# Patient Record
Sex: Female | Born: 1955 | Race: White | Hispanic: No | Smoking: Never smoker
Health system: Southern US, Community
[De-identification: ages and names within clinical notes are randomized; demographics above are authoritative.]

## PROBLEM LIST (undated history)

## (undated) DIAGNOSIS — E669 Obesity, unspecified: Secondary | ICD-10-CM

## (undated) DIAGNOSIS — Z9889 Other specified postprocedural states: Secondary | ICD-10-CM

## (undated) DIAGNOSIS — N189 Chronic kidney disease, unspecified: Secondary | ICD-10-CM

## (undated) DIAGNOSIS — I4892 Unspecified atrial flutter: Secondary | ICD-10-CM

## (undated) DIAGNOSIS — I4891 Unspecified atrial fibrillation: Secondary | ICD-10-CM

## (undated) DIAGNOSIS — R112 Nausea with vomiting, unspecified: Secondary | ICD-10-CM

## (undated) DIAGNOSIS — E119 Type 2 diabetes mellitus without complications: Secondary | ICD-10-CM

## (undated) DIAGNOSIS — I10 Essential (primary) hypertension: Secondary | ICD-10-CM

## (undated) DIAGNOSIS — E039 Hypothyroidism, unspecified: Secondary | ICD-10-CM

## (undated) HISTORY — DX: Obesity, unspecified: E66.9

## (undated) HISTORY — DX: Chronic kidney disease, unspecified: N18.9

## (undated) HISTORY — DX: Unspecified atrial fibrillation: I48.91

---

## 1977-12-18 HISTORY — PX: CHOLECYSTECTOMY OPEN: SUR202

## 1977-12-18 HISTORY — PX: APPENDECTOMY: SHX54

## 1978-12-18 HISTORY — PX: TUBAL LIGATION: SHX77

## 2001-07-11 ENCOUNTER — Encounter: Payer: Self-pay | Admitting: Obstetrics and Gynecology

## 2001-07-11 ENCOUNTER — Ambulatory Visit (HOSPITAL_COMMUNITY): Admission: RE | Admit: 2001-07-11 | Discharge: 2001-07-11 | Payer: Self-pay | Admitting: Obstetrics and Gynecology

## 2001-08-05 ENCOUNTER — Emergency Department (HOSPITAL_COMMUNITY): Admission: EM | Admit: 2001-08-05 | Discharge: 2001-08-05 | Payer: Self-pay | Admitting: Emergency Medicine

## 2001-08-05 ENCOUNTER — Encounter: Payer: Self-pay | Admitting: Emergency Medicine

## 2009-07-07 ENCOUNTER — Emergency Department (HOSPITAL_COMMUNITY): Admission: EM | Admit: 2009-07-07 | Discharge: 2009-07-08 | Payer: Self-pay | Admitting: Emergency Medicine

## 2009-10-06 ENCOUNTER — Ambulatory Visit (HOSPITAL_COMMUNITY)
Admission: RE | Admit: 2009-10-06 | Discharge: 2009-10-06 | Payer: Self-pay | Admitting: Physical Medicine and Rehabilitation

## 2010-06-18 ENCOUNTER — Emergency Department (HOSPITAL_COMMUNITY): Admission: EM | Admit: 2010-06-18 | Discharge: 2010-06-18 | Payer: Self-pay | Admitting: Emergency Medicine

## 2010-10-23 ENCOUNTER — Emergency Department (HOSPITAL_COMMUNITY): Admission: EM | Admit: 2010-10-23 | Discharge: 2010-10-23 | Payer: Self-pay | Admitting: Emergency Medicine

## 2011-02-28 LAB — BASIC METABOLIC PANEL
CO2: 27 mEq/L (ref 19–32)
Calcium: 9.1 mg/dL (ref 8.4–10.5)
GFR calc Af Amer: 54 mL/min — ABNORMAL LOW (ref >60)
Sodium: 137 mEq/L (ref 135–145)

## 2011-02-28 LAB — CBC
Hemoglobin: 12.8 g/dL (ref 12.0–15.0)
MCH: 27.5 pg (ref 26.0–34.0)
RBC: 4.63 MIL/uL (ref 3.87–5.11)
WBC: 9.3 10*3/uL (ref 4.0–10.5)

## 2011-02-28 LAB — DIFFERENTIAL
Eosinophils Absolute: 0.2 10*3/uL (ref 0.0–0.7)
Lymphocytes Relative: 19 % (ref 12–46)
Lymphs Abs: 1.8 10*3/uL (ref 0.7–4.0)
Monocytes Relative: 7 % (ref 3–12)
Neutro Abs: 6.7 10*3/uL (ref 1.7–7.7)
Neutrophils Relative %: 72 % (ref 43–77)

## 2011-02-28 LAB — POCT CARDIAC MARKERS
CKMB, poc: 3.9 ng/mL (ref 1.0–8.0)
Myoglobin, poc: 166 ng/mL (ref 12–200)
Troponin i, poc: <0.05 ng/mL (ref 0.00–0.09)

## 2011-03-05 LAB — POCT CARDIAC MARKERS
Myoglobin, poc: 86.4 ng/mL (ref 12–200)
Troponin i, poc: <0.05 ng/mL (ref 0.00–0.09)
Troponin i, poc: <0.05 ng/mL (ref 0.00–0.09)

## 2011-03-05 LAB — DIFFERENTIAL
Basophils Absolute: 0 10*3/uL (ref 0.0–0.1)
Eosinophils Absolute: 0.4 10*3/uL (ref 0.0–0.7)
Lymphocytes Relative: 20 % (ref 12–46)
Lymphs Abs: 1.8 10*3/uL (ref 0.7–4.0)
Neutrophils Relative %: 68 % (ref 43–77)

## 2011-03-05 LAB — BASIC METABOLIC PANEL
BUN: 31 mg/dL — ABNORMAL HIGH (ref 6–23)
Calcium: 9 mg/dL (ref 8.4–10.5)
Creatinine, Ser: 1.17 mg/dL (ref 0.4–1.2)
GFR calc Af Amer: 59 mL/min — ABNORMAL LOW (ref >60)

## 2011-03-05 LAB — CBC
Platelets: 195 10*3/uL (ref 150–400)
RBC: 4.3 MIL/uL (ref 3.87–5.11)
RDW: 13.1 % (ref 11.5–15.5)
WBC: 9.4 10*3/uL (ref 4.0–10.5)

## 2012-11-09 ENCOUNTER — Emergency Department (HOSPITAL_COMMUNITY): Payer: 59

## 2012-11-09 ENCOUNTER — Inpatient Hospital Stay (HOSPITAL_COMMUNITY)
Admission: EM | Admit: 2012-11-09 | Discharge: 2012-11-11 | DRG: 309 | Disposition: A | Discharge status: Home / Self Care | Payer: 59 | Attending: Family Medicine | Admitting: Family Medicine

## 2012-11-09 ENCOUNTER — Encounter (HOSPITAL_COMMUNITY): Payer: Self-pay | Admitting: Emergency Medicine

## 2012-11-09 DIAGNOSIS — E119 Type 2 diabetes mellitus without complications: Secondary | ICD-10-CM | POA: Diagnosis present

## 2012-11-09 DIAGNOSIS — I10 Essential (primary) hypertension: Secondary | ICD-10-CM | POA: Diagnosis present

## 2012-11-09 DIAGNOSIS — E039 Hypothyroidism, unspecified: Secondary | ICD-10-CM | POA: Diagnosis present

## 2012-11-09 DIAGNOSIS — R079 Chest pain, unspecified: Secondary | ICD-10-CM

## 2012-11-09 DIAGNOSIS — Z6841 Body Mass Index (BMI) 40.0 and over, adult: Secondary | ICD-10-CM

## 2012-11-09 DIAGNOSIS — E669 Obesity, unspecified: Secondary | ICD-10-CM | POA: Diagnosis present

## 2012-11-09 DIAGNOSIS — I4891 Unspecified atrial fibrillation: Secondary | ICD-10-CM

## 2012-11-09 DIAGNOSIS — Z79899 Other long term (current) drug therapy: Secondary | ICD-10-CM

## 2012-11-09 LAB — LIPID PANEL
Cholesterol: 188 mg/dL (ref 0–200)
LDL Cholesterol: 110 mg/dL — ABNORMAL HIGH (ref 0–99)
Total CHOL/HDL Ratio: 3.3 RATIO
Triglycerides: 107 mg/dL (ref <150)
VLDL: 21 mg/dL (ref 0–40)

## 2012-11-09 LAB — HEPATIC FUNCTION PANEL
Albumin: 2.9 g/dL — ABNORMAL LOW (ref 3.5–5.2)
Alkaline Phosphatase: 48 U/L (ref 39–117)
Total Bilirubin: 0.3 mg/dL (ref 0.3–1.2)
Total Protein: 6.5 g/dL (ref 6.0–8.3)

## 2012-11-09 LAB — CBC
HCT: 35.7 % — ABNORMAL LOW (ref 36.0–46.0)
Hemoglobin: 11.8 g/dL — ABNORMAL LOW (ref 12.0–15.0)
MCV: 82.1 fL (ref 78.0–100.0)
RDW: 14.4 % (ref 11.5–15.5)
WBC: 9.1 10*3/uL (ref 4.0–10.5)

## 2012-11-09 LAB — BASIC METABOLIC PANEL
BUN: 33 mg/dL — ABNORMAL HIGH (ref 6–23)
Chloride: 96 mEq/L (ref 96–112)
Creatinine, Ser: 1.74 mg/dL — ABNORMAL HIGH (ref 0.50–1.10)
GFR calc Af Amer: 37 mL/min — ABNORMAL LOW (ref >90)
Glucose, Bld: 196 mg/dL — ABNORMAL HIGH (ref 70–99)
Potassium: 3.6 mEq/L (ref 3.5–5.1)

## 2012-11-09 LAB — TROPONIN I
Troponin I: <0.3 ng/mL (ref <0.30)
Troponin I: <0.3 ng/mL (ref <0.30)

## 2012-11-09 MED ORDER — DILTIAZEM HCL ER 60 MG PO CP12
120.0000 mg | ORAL_CAPSULE | Freq: Two times a day (BID) | ORAL | Status: DC
  Administered 2012-11-09 – 2012-11-11 (×5): 120 mg via ORAL
  Filled 2012-11-09 (×9): qty 2

## 2012-11-09 MED ORDER — NITROGLYCERIN 0.4 MG SL SUBL
0.4000 mg | SUBLINGUAL_TABLET | SUBLINGUAL | Status: DC | PRN
  Filled 2012-11-09: qty 25

## 2012-11-09 MED ORDER — ENOXAPARIN SODIUM 60 MG/0.6ML ~~LOC~~ SOLN
60.0000 mg | Freq: Every day | SUBCUTANEOUS | Status: DC
  Administered 2012-11-09 – 2012-11-11 (×3): 60 mg via SUBCUTANEOUS
  Filled 2012-11-09 (×3): qty 0.6

## 2012-11-09 MED ORDER — DILTIAZEM HCL 25 MG/5ML IV SOLN
INTRAVENOUS | Status: AC
  Filled 2012-11-09: qty 5

## 2012-11-09 MED ORDER — SODIUM CHLORIDE 0.9 % IV SOLN
INTRAVENOUS | Status: DC
  Administered 2012-11-09: 07:00:00 via INTRAVENOUS

## 2012-11-09 MED ORDER — METOPROLOL TARTRATE 25 MG PO TABS
25.0000 mg | ORAL_TABLET | Freq: Two times a day (BID) | ORAL | Status: DC
  Administered 2012-11-09 – 2012-11-11 (×5): 25 mg via ORAL
  Filled 2012-11-09 (×5): qty 1

## 2012-11-09 MED ORDER — METOPROLOL TARTRATE 50 MG PO TABS: 50.0000 mg | ORAL_TABLET | Freq: Two times a day (BID) | ORAL | Status: DC

## 2012-11-09 MED ORDER — DILTIAZEM HCL 100 MG IV SOLR
5.0000 mg/h | INTRAVENOUS | Status: DC
  Administered 2012-11-09: 5 mg/h via INTRAVENOUS

## 2012-11-09 MED ORDER — SODIUM CHLORIDE 0.9 % IV SOLN
INTRAVENOUS | Status: DC
  Administered 2012-11-09: 05:00:00 via INTRAVENOUS

## 2012-11-09 MED ORDER — DILTIAZEM HCL 50 MG/10ML IV SOLN
10.0000 mg | Freq: Once | INTRAVENOUS | Status: AC
  Administered 2012-11-09: 10 mg via INTRAVENOUS
  Filled 2012-11-09: qty 2

## 2012-11-09 MED ORDER — LEVOTHYROXINE SODIUM 25 MCG PO TABS
25.0000 ug | ORAL_TABLET | Freq: Every day | ORAL | Status: DC
  Administered 2012-11-09 – 2012-11-11 (×3): 25 ug via ORAL
  Filled 2012-11-09 (×3): qty 1

## 2012-11-09 MED ORDER — ASPIRIN 81 MG PO CHEW
324.0000 mg | CHEWABLE_TABLET | Freq: Once | ORAL | Status: AC
  Administered 2012-11-09: 324 mg via ORAL
  Filled 2012-11-09: qty 4

## 2012-11-09 NOTE — Progress Notes (Signed)
Pharmacy Consult: Lovenox for VTE prophylaxis.  Patient Measurements: Height: 5\' 6"  (167.6 cm) Weight: 290 lb 5.5 oz (131.7 kg) IBW/kg (Calculated) : 59.3  Body mass index is 46.86 kg/(m^2).   VITALS Filed Vitals:   11/09/12 0817  BP: 104/70  Pulse: 62  Temp: 97.3 F (36.3 C)  Resp: 17    INR Last Three Days: No results found for this basename: INR:3 in the last 72 hours  CBC:    Component Value Date/Time   WBC 9.1 11/09/2012 0521   RBC 4.35 11/09/2012 0521   HGB 11.8* 11/09/2012 0521   HCT 35.7* 11/09/2012 0521   PLT 193 11/09/2012 0521   MCV 82.1 11/09/2012 0521   MCH 27.1 11/09/2012 0521   MCHC 33.1 11/09/2012 0521   RDW 14.4 11/09/2012 0521   LYMPHSABS 1.8 10/23/2010 0424   MONOABS 0.6 10/23/2010 0424   EOSABS 0.2 10/23/2010 0424   BASOSABS 0.0 10/23/2010 0424    RENAL FUNCTION: Estimated Creatinine Clearance: 51.5 ml/min (by C-G formula based on Cr of 1.74).  Assessment: Dose stable for age, weight, renal function and indication.  Plan: Dose adjusted (0.5mg /kg) for increased BMI. Sign off.  Pricilla Larsson, [facility] 11/09/2012 11:06 AM

## 2012-11-09 NOTE — ED Provider Notes (Signed)
History     CSN: [id number]  Arrival date & time 11/09/12  0445   First MD Initiated Contact with Patient 11/09/12 0446      Chief Complaint  Patient presents with  . Chest Pain    (Consider location/radiation/quality/duration/timing/severity/associated sxs/prior treatment) HPI History provided by patient. We'll go tonight around 3 AM with palpitations and chest pain radiating across her chest down her left arm and also to her jaw. Moderate severity with chest pain lasting about 15 minutes and still somewhat mildly present. Was moderate in severity. Pressure-like in quality. Patient is treated for high blood pressure has no known history of heart disease. Is not a smoker. Has strong family history of heart disease in both of her parents with coronary artery disease at young age. Her brother had an MI in his 63s. Pt  has never had a stress test. has had chest pain in the past.  Past Medical History  Diagnosis Date  . Hypertension   . Thyroid disease     Past Surgical History  Procedure Date  . Cholecystectomy     History reviewed. No pertinent family history.  History  Substance Use Topics  . Smoking status: Never Smoker   . Smokeless tobacco: Not on file  . Alcohol Use: No    OB History    Grav Para Term Preterm Abortions TAB SAB Ect Mult Living                  Review of Systems  Constitutional: Negative for fever and chills.  HENT: Negative for neck pain and neck stiffness.   Eyes: Negative for pain.  Respiratory: Negative for shortness of breath.   Cardiovascular: Positive for chest pain and palpitations.  Gastrointestinal: Negative for abdominal pain.  Genitourinary: Negative for dysuria.  Musculoskeletal: Negative for back pain.  Skin: Negative for rash.  Neurological: Negative for headaches.  All other systems reviewed and are negative.    Allergies  Review of patient's allergies indicates no known allergies.  Home Medications   Current Outpatient  Rx  Name  Route  Sig  Dispense  Refill  . LEVOTHYROXINE SODIUM 25 MCG PO TABS   Oral   Take 25 mcg by mouth daily.           BP 141/73  Pulse 103  Temp 97.8 F (36.6 C) (Oral)  Resp 18  Ht 5\' 6"  (1.676 m)  Wt 210 lb (95.255 kg)  BMI 33.89 kg/m2  SpO2 98%  Physical Exam  Constitutional: She is oriented to person, place, and time. She appears well-developed and well-nourished.  HENT:  Head: Normocephalic and atraumatic.  Eyes: Conjunctivae normal and EOM are normal. Pupils are equal, round, and reactive to light.  Neck: Trachea normal. Neck supple. No thyromegaly present.  Cardiovascular: S1 normal, S2 normal and normal pulses.     No systolic murmur is present   No diastolic murmur is present  Pulses:      Radial pulses are 2+ on the right side, and 2+ on the left side.       Rapid irregular  Pulmonary/Chest: Effort normal and breath sounds normal. She has no wheezes. She has no rhonchi. She has no rales. She exhibits no tenderness.  Abdominal: Soft. Normal appearance and bowel sounds are normal. There is no tenderness. There is no CVA tenderness and negative Murphy's sign.  Musculoskeletal:       BLE:s Calves nontender, no cords or erythema, negative Homans sign  Neurological: She is alert  and oriented to person, place, and time. She has normal strength. No cranial nerve deficit or sensory deficit. GCS eye subscore is 4. GCS verbal subscore is 5. GCS motor subscore is 6.  Skin: Skin is warm and dry. No rash noted. She is not diaphoretic.  Psychiatric: Her speech is normal.       Cooperative and appropriate    ED Course  Procedures (including critical care time)  Results for orders placed during the hospital encounter of 11/09/12  CBC      Component Value Range   WBC 9.1  4.0 - 10.5 K/uL   RBC 4.35  3.87 - 5.11 MIL/uL   Hemoglobin 11.8 (*) 12.0 - 15.0 g/dL   HCT 35.7 (*) 36.0 - 46.0 %   MCV 82.1  78.0 - 100.0 fL   MCH 27.1  26.0 - 34.0 pg   MCHC 33.1  30.0 - 36.0  g/dL   RDW 14.4  11.5 - 15.5 %   Platelets 193  150 - 400 K/uL  BASIC METABOLIC PANEL      Component Value Range   Sodium 138  135 - 145 mEq/L   Potassium 3.6  3.5 - 5.1 mEq/L   Chloride 96  96 - 112 mEq/L   CO2 30  19 - 32 mEq/L   Glucose, Bld 196 (*) 70 - 99 mg/dL   BUN 33 (*) 6 - 23 mg/dL   Creatinine, Ser 1.74 (*) 0.50 - 1.10 mg/dL   Calcium 9.7  8.4 - 10.5 mg/dL   GFR calc non Af Amer 32 (*) >90 mL/min   GFR calc Af Amer 37 (*) >90 mL/min  TROPONIN I      Component Value Range   Troponin I <0.30  <0.30 ng/mL   Dg Chest Portable 1 View  11/09/2012  *RADIOLOGY REPORT*  Clinical Data: Palpitations and chest pain radiating to the left arm.  PORTABLE CHEST - 1 VIEW  Comparison: 10/23/2010  Findings: The heart size and pulmonary vascularity are normal. The lungs appear clear and expanded without focal air space disease or consolidation. No blunting of the costophrenic angles.  No pneumothorax.  Mediastinal contours appear intact.  No significant change since previous study.  IMPRESSION: No evidence of active pulmonary disease.   Original Report Authenticated By: Lucienne Capers, M.D.        Date: 11/09/2012  Rate: 152  Rhythm: atrial fibrillation  QRS Axis: normal  Intervals: normal  ST/T Wave abnormalities: nonspecific ST changes  Conduction Disutrbances:none  Narrative Interpretation:   Old EKG Reviewed: none available  CRITICAL CARE Performed by: Markel Kurtenbach   Total critical care time: 45  Critical care time was exclusive of separately billable procedures and treating other patients.  Critical care was necessary to treat or prevent imminent or life-threatening deterioration.  Critical care was time spent personally by me on the following activities: development of treatment plan with patient and/or surrogate as well as nursing, discussions with consultants, evaluation of patient's response to treatment, examination of patient, obtaining history from patient or  surrogate, ordering and performing treatments and interventions, ordering and review of laboratory studies, ordering and review of radiographic studies, pulse oximetry and re-evaluation of patient's condition. IV Cardizem initiated for rapid A. Fib rate 150s to 170s. Patient initially normotensive but after Cardizem bolus and drip dropped into systolic in the [id number] requiring IV fluid boluses. Patient remains symptomatic in rapid A. Fib with challenging balance between rate control and blood pressure control. Chest x-ray, EKG and labs  reviewed as above. Medicine consult for step down ICU admission. Family updated bedside. Patient updated bedside. Serial evaluations.  6:33 AM case d/w Dr Hilma Favors - will admit. HR improving still in a fib. BP improving with IVfs still running.  MDM   Rapid atrial fibrillation with history of thyroid disease and strong family history of heart disease. Requiring IV Cardizem bolus and drip. IVFs. Labs. CXR. Step down admit.        Teressa Lower, MD 11/09/12 [phone]

## 2012-11-09 NOTE — ED Notes (Signed)
Patient complaining of palpitations and chest pain starting approximately 2 hours ago. States pain radiates into left arm and jaw area.

## 2012-11-09 NOTE — Progress Notes (Signed)
DR DONDIEGO AWARE OF ADMISSION OF PT TO ICU. HE WILL BE COMING TO EXAMINE.

## 2012-11-09 NOTE — Progress Notes (Signed)
PT RECEIVED 120MG  PO CARDIZEM 1HR AGO. IV CARDIZEM DRIP IS NOW STOPPED. PT IN SB TO NSR.

## 2012-11-09 NOTE — H&P (Signed)
[id number] 

## 2012-11-09 NOTE — Progress Notes (Signed)
DR DONDIEGO IN TO EXAMINE PT AND DISCUSS PLAN OF CARE.

## 2012-11-10 LAB — BASIC METABOLIC PANEL
BUN: 28 mg/dL — ABNORMAL HIGH (ref 6–23)
Calcium: 9.6 mg/dL (ref 8.4–10.5)
Creatinine, Ser: 1.29 mg/dL — ABNORMAL HIGH (ref 0.50–1.10)
GFR calc Af Amer: 53 mL/min — ABNORMAL LOW (ref >90)
GFR calc non Af Amer: 46 mL/min — ABNORMAL LOW (ref >90)
Glucose, Bld: 166 mg/dL — ABNORMAL HIGH (ref 70–99)
Potassium: 4 mEq/L (ref 3.5–5.1)

## 2012-11-10 NOTE — Progress Notes (Signed)
11/10/12 1703 Patient ambulated in hallway several times this afternoon, tolerated well. Donavan Foil, RN.

## 2012-11-10 NOTE — H&P (Signed)
NAMEMarland Kitchen  JACK, RISTAU NO.:  [id number]  MEDICAL RECORD NO.:  [id number]  LOCATION:  A316                          FACILITY:  [facility]  PHYSICIAN:  Unk Lightning, MDDATE OF BIRTH:  [birth year]  DATE OF ADMISSION:  11/09/2012 DATE OF DISCHARGE:  LH                             HISTORY & PHYSICAL   HISTORY OF PRESENT ILLNESS:  The patient is a 56 year old married white female who has had 3 or 4 episodes of presumably paroxysmal atrial fibrillation.  She came last night awake by rapid heartbeat jumping in of her chest associated with some chest tightness and discomfort.  She was quite anxious and fearful.  She came to the ER, was found to be in paroxysmal AFib with rapid ventricular response, given IV diltiazem and subsequently converted to sinus rhythm.  She is only on thyroid medicine and her blood pressure medicine.  She never smoked, has a normal cholesterol.  She states is not diabetic.  The patient will be admitted, obtain 2D echocardiogram, thyroid function test, assess for chambers dimensions and possible functional evaluation if they deem clinically necessary.  PAST MEDICAL HISTORY:  Significant for hypertension, hypothyroidism supplemented.  PAST SURGICAL HISTORY:  Remarkable for cholecystectomy.  CURRENT MEDICINES:  Synthroid 0.25 mg p.o. daily and her blood pressure medicine unknown, calcium, and multiple vitamins.  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  PHYSICAL EXAMINATION:  VITAL SIGNS:  Blood pressure 141/73, pulse 103 and regular, temperature 97.8, respiratory rate is 18. HEENT:  Eyes, PERRLA.  Extraocular movements intact.  Sclerae clear. Conjunctivae pink. NECK:  No JVD.  No carotid bruits.  No thyromegaly.  No thyroid bruits. LUNGS:  Clear to A and P.  No rales, wheeze, or rhonchi. HEART:  Regular rhythm.  No S3, S4, gallops.  No heaves, thrills, or rubs. ABDOMEN:  Soft, nontender.  Bowel sounds are normoactive.   No guarding, rebound, mass, or megaly. EXTREMITIES:  1+ pedal edema.  No palpable cord tenderness. NEUROLOGIC:  Cranial nerves are grossly intact, II through XII.  The patient moves all four extremities.  Plantars are downgoing.  IMPRESSION: 1. Paroxysmal atrial fibrillation, presumably recurrent episode 3 or     4. 2. Hypothyroidism, on supplementation with possible overactive     thyroid.  We will check TFTs. 3. Hypertension.  PLAN:  Right now is to continue IV diltiazem, switch to oral beta and/or calcium channel blockade to control ventricular response.  Monitor the patient's thyroid function test.  We will make further recommendations as the database expands.     Unk Lightning, MD     RMD/MEDQ  D:  11/09/2012  T:  11/10/2012  Job:  [id number]

## 2012-11-10 NOTE — Progress Notes (Signed)
[id number] 

## 2012-11-11 DIAGNOSIS — I319 Disease of pericardium, unspecified: Secondary | ICD-10-CM

## 2012-11-11 LAB — BASIC METABOLIC PANEL
BUN: 27 mg/dL — ABNORMAL HIGH (ref 6–23)
Chloride: 103 mEq/L (ref 96–112)
Glucose, Bld: 196 mg/dL — ABNORMAL HIGH (ref 70–99)
Potassium: 4.6 mEq/L (ref 3.5–5.1)

## 2012-11-11 MED ORDER — DILTIAZEM HCL 120 MG PO TABS: 120.0000 mg | ORAL_TABLET | Freq: Two times a day (BID) | ORAL | Status: DC

## 2012-11-11 MED ORDER — LEVOTHYROXINE SODIUM 150 MCG PO TABS: 150.0000 ug | ORAL_TABLET | Freq: Every day | ORAL | Status: DC

## 2012-11-11 NOTE — Care Management Note (Signed)
    Page 1 of 1   11/11/2012     11:13:47 AM   CARE MANAGEMENT NOTE 11/11/2012  Patient:  VELTA, MCMORRIS   Account Number:  [id number]  Date Initiated:  11/11/2012  Documentation initiated by:  Theophilus Kinds  Subjective/Objective Assessment:   Pt admitted from home with A fib. Pt lives with her husband and will return home at discharge. Pt is indepenent with ADL's.     Action/Plan:   No CM or HH needs noted. Pt to be discharged later on 11/11/12.   Anticipated DC Date:  11/11/2012   Anticipated DC Plan:  [place]  CM consult      Choice offered to / List presented to:             Status of service:  Completed, signed off Medicare Important Message given?   (If response is "NO", the following Medicare IM given date fields will be blank) Date Medicare IM given:   Date Additional Medicare IM given:    Discharge Disposition:  HOME/SELF CARE  Per UR Regulation:    If discussed at Long Length of Stay Meetings, dates discussed:    Comments:  11/11/12 [place], RN BSN CM

## 2012-11-11 NOTE — Progress Notes (Signed)
UR Chart Review Completed  

## 2012-11-11 NOTE — Discharge Summary (Signed)
NAMEMarland Kitchen  GREYDIS, DUTT NO.:  [id number]  MEDICAL RECORD NO.:  [id number]  LOCATION:  A316                          FACILITY:  [facility]  PHYSICIAN:  Estill Bamberg. Karie Kirks, M.D.DATE OF BIRTH:  16-May-1956  DATE OF ADMISSION:  11/09/2012 DATE OF DISCHARGE:  11/25/2013LH                              DISCHARGE SUMMARY   This 56 year old was admitted with atrial fibrillation.  She had a benign 3-day hospitalization extending from November 23 to November 11, 2012  Vital signs remained stable.  Admission CBC was essentially normal.  Glucoses were 196, 168, and 196. Admission BUN was 33 with creatinine 1.74, which improved to 27/1.31. Her albumin was 2.9.  Troponins were less than 0.30.  Her lipid profile was acceptable.  Her TSH, which was originally 0.367, was rechecked at 0.276.  MRSA was negative.  Chest x-ray showed no pulmonary disease.  EKG showed atrial flutter with a variable AV block with ST depression in the inferior leads and the anterior leads, with the rate being 152.  She was treated in the ER with 10 mg of diltiazem IV, which was repeated, and then put on IV infusion of 5 mg an hour.  This was later discontinued and she was put on diltiazem 120 mg daily.  She was continued on levothyroxine 175 mcg a day and hydrochlorothiazide 25 mg a day.  During hospitalization she improved, palpitations cleared, and by the day of discharge she felt much improved.  FINAL DISCHARGE DIAGNOSES: 1. Atrial fibrillation with rapid ventricular response. 2. Electrocardiogram ischemic changes may be secondary to her rate. 3. Hypothyroidism. 4. Benign essential hypertension. 5. Diabetes. 6. Obesity.  She is discharged home on home meds, except I am switching her from levothyroxine 175 to levothyroxine 150 mcg daily.  Before discharge, she will have cardiac echo, but also an EKG.  Depending on that, we will arrange for her to see Cardiology either outpatient or inpatient.   I discussed this with her.  Follow-up will be in the office within the week.  I wrote prescriptions for levothyroxine 150 mcg (30 with 11 refills) and also diltiazem 120 mg b.i.d. (60 with 11 refills).  Reviewing the record, I note that she apparently thought that she only needed to take the diltiazem once a day since she fell okay on this.  I have encouraged her to use this twice a day and if her blood pressure is too low, we can cut back her HCTZ.     Estill Bamberg. Karie Kirks, M.D.     SDK/MEDQ  D:  11/11/2012  T:  11/11/2012  Job:  [id number]

## 2012-11-11 NOTE — Progress Notes (Signed)
Inpatient Diabetes Program Recommendations  AACE/ADA: New Consensus Statement on Inpatient Glycemic Control (2013)  Target Ranges:  Prepandial:   less than 140 mg/dL      Peak postprandial:   less than 180 mg/dL (1-2 hours)      Critically ill patients:  140 - 180 mg/dL   Results for SHABRIA, TILLES (MRN [id number]) as of 11/11/2012 10:20  Ref. Range 11/09/2012 05:21 11/10/2012 06:30 11/11/2012 05:28  Glucose Latest Range: 70-99 mg/dL 196 (H) 166 (H) 196 (H)    Note: No documented history of diabetes noted in chart.  However, patient's fasting CBGs have been elevated over the past 3 days.  There is a discharge order already in the computer.  Called [facility] [address] and asked if Dr. Karie Kirks was still on the floor to make him aware of elevated blood glucose results.  He had already left the floor.  Called Dr. Vickey Sages office and spoke with secretary and gave her patient's information and asked that she let Dr. Karie Kirks know about the above fasting CBG results.  Also left my contact information in case he wanted to call me back.    Thanks, Barnie Alderman, RN, BSN, [place] [facility] Coordinator Inpatient Diabetes Program [phone]

## 2012-11-11 NOTE — Plan of Care (Signed)
Problem: Discharge Progression Outcomes Goal: Discharge plan in place and appropriate Outcome: Completed/Met Date Met:  11/11/12 D/c home stable condition, follow up with pcp in week.

## 2012-11-11 NOTE — Progress Notes (Signed)
NAMEMarland Kitchen  SONATA, [name] NO.:  [id number]  MEDICAL RECORD NO.:  [id number]  LOCATION:  A316                          FACILITY:  [facility]  PHYSICIAN:  Unk Lightning, MDDATE OF BIRTH:  09-Jun-1956  DATE OF PROCEDURE: DATE OF DISCHARGE:                                PROGRESS NOTE   The patient was admitted with paroxysmal AFib.  She has had 2-3 episodes prior to admission and over preceding months converted on IV diltiazem currently in sinus rhythm.  Hemodynamically stable, placed on diltiazem 120 p.o. b.i.d. orally as well as Lopressor 25 p.o. b.i.d. Echocardiogram awaiting, BMET will be performed in a.m.  The patient has no angina or anginal equivalents.  Blood pressure 121/56, temperature 97.8 pulse 58 and regular, respiratory rate is 20, creatinine 1.29. Lungs are clear.  Heart are unremarkable.  No rales, S3-S4, no heaves, thrills, or rubs.  Lungs showed no rales, wheeze, or rhonchi.  Heart, abdomen totally benign.  The impression is paroxysmal atrial fibrillation, converted to sinus rhythm.  An 2D echo ordered for a.m. to assess chamber dimensions, contractility in asystolic or diastolic dysfunction.  We will wait Dr. Vickey Sages return in a.m.     Unk Lightning, MD     RMD/MEDQ  D:  11/10/2012  T:  11/11/2012  Job:  [id number]

## 2012-11-11 NOTE — Progress Notes (Signed)
*  PRELIMINARY RESULTS* Echocardiogram 2D Echocardiogram has been performed.  Isabella Hall 11/11/2012, 9:52 AM

## 2013-04-02 ENCOUNTER — Encounter: Payer: Self-pay | Admitting: *Deleted

## 2013-04-09 ENCOUNTER — Other Ambulatory Visit (HOSPITAL_COMMUNITY): Payer: Self-pay | Admitting: Cardiovascular Disease

## 2013-04-09 DIAGNOSIS — R079 Chest pain, unspecified: Secondary | ICD-10-CM

## 2013-04-16 ENCOUNTER — Encounter (HOSPITAL_COMMUNITY): Payer: 59

## 2013-04-23 ENCOUNTER — Encounter: Payer: Self-pay | Admitting: Cardiovascular Disease

## 2013-04-29 ENCOUNTER — Encounter (INDEPENDENT_AMBULATORY_CARE_PROVIDER_SITE_OTHER): Payer: 59

## 2013-04-29 DIAGNOSIS — R0602 Shortness of breath: Secondary | ICD-10-CM

## 2013-04-30 ENCOUNTER — Ambulatory Visit: Payer: 59 | Admitting: Cardiovascular Disease

## 2013-05-07 ENCOUNTER — Telehealth: Payer: Self-pay | Admitting: *Deleted

## 2013-05-07 NOTE — Telephone Encounter (Signed)
Normal MET test  Report called to pt.  Pt voiced understanding.  appt in June will be rescheduled to the [place] office-pt notified

## 2013-05-21 ENCOUNTER — Ambulatory Visit: Payer: 59 | Admitting: Cardiovascular Disease

## 2015-01-19 ENCOUNTER — Encounter (HOSPITAL_COMMUNITY): Payer: Self-pay

## 2015-01-19 ENCOUNTER — Emergency Department (HOSPITAL_COMMUNITY): Payer: 59

## 2015-01-19 ENCOUNTER — Inpatient Hospital Stay (HOSPITAL_COMMUNITY)
Admission: EM | Admit: 2015-01-19 | Discharge: 2015-01-29 | DRG: 286 | Disposition: A | Discharge status: Home / Self Care | Payer: 59 | Attending: Internal Medicine | Admitting: Internal Medicine

## 2015-01-19 DIAGNOSIS — I2510 Atherosclerotic heart disease of native coronary artery without angina pectoris: Secondary | ICD-10-CM | POA: Diagnosis present

## 2015-01-19 DIAGNOSIS — I248 Other forms of acute ischemic heart disease: Secondary | ICD-10-CM | POA: Insufficient documentation

## 2015-01-19 DIAGNOSIS — E1122 Type 2 diabetes mellitus with diabetic chronic kidney disease: Secondary | ICD-10-CM

## 2015-01-19 DIAGNOSIS — I4892 Unspecified atrial flutter: Secondary | ICD-10-CM | POA: Diagnosis not present

## 2015-01-19 DIAGNOSIS — R7989 Other specified abnormal findings of blood chemistry: Secondary | ICD-10-CM

## 2015-01-19 DIAGNOSIS — I4891 Unspecified atrial fibrillation: Secondary | ICD-10-CM

## 2015-01-19 DIAGNOSIS — G4730 Sleep apnea, unspecified: Secondary | ICD-10-CM | POA: Diagnosis present

## 2015-01-19 DIAGNOSIS — E872 Acidosis: Secondary | ICD-10-CM

## 2015-01-19 DIAGNOSIS — E8729 Other acidosis: Secondary | ICD-10-CM

## 2015-01-19 DIAGNOSIS — N183 Chronic kidney disease, stage 3 (moderate): Secondary | ICD-10-CM | POA: Diagnosis present

## 2015-01-19 DIAGNOSIS — E669 Obesity, unspecified: Secondary | ICD-10-CM | POA: Diagnosis present

## 2015-01-19 DIAGNOSIS — E119 Type 2 diabetes mellitus without complications: Secondary | ICD-10-CM

## 2015-01-19 DIAGNOSIS — E039 Hypothyroidism, unspecified: Secondary | ICD-10-CM | POA: Diagnosis present

## 2015-01-19 DIAGNOSIS — Z7982 Long term (current) use of aspirin: Secondary | ICD-10-CM

## 2015-01-19 DIAGNOSIS — I429 Cardiomyopathy, unspecified: Secondary | ICD-10-CM | POA: Diagnosis present

## 2015-01-19 DIAGNOSIS — I10 Essential (primary) hypertension: Secondary | ICD-10-CM | POA: Diagnosis present

## 2015-01-19 DIAGNOSIS — R778 Other specified abnormalities of plasma proteins: Secondary | ICD-10-CM

## 2015-01-19 DIAGNOSIS — I129 Hypertensive chronic kidney disease with stage 1 through stage 4 chronic kidney disease, or unspecified chronic kidney disease: Secondary | ICD-10-CM | POA: Diagnosis present

## 2015-01-19 DIAGNOSIS — I5031 Acute diastolic (congestive) heart failure: Secondary | ICD-10-CM | POA: Diagnosis present

## 2015-01-19 DIAGNOSIS — I481 Persistent atrial fibrillation: Secondary | ICD-10-CM | POA: Diagnosis not present

## 2015-01-19 DIAGNOSIS — J810 Acute pulmonary edema: Secondary | ICD-10-CM

## 2015-01-19 DIAGNOSIS — N1832 Chronic kidney disease, stage 3b: Secondary | ICD-10-CM

## 2015-01-19 DIAGNOSIS — I4581 Long QT syndrome: Secondary | ICD-10-CM | POA: Diagnosis present

## 2015-01-19 DIAGNOSIS — Z8249 Family history of ischemic heart disease and other diseases of the circulatory system: Secondary | ICD-10-CM

## 2015-01-19 DIAGNOSIS — I428 Other cardiomyopathies: Secondary | ICD-10-CM | POA: Insufficient documentation

## 2015-01-19 DIAGNOSIS — Z79899 Other long term (current) drug therapy: Secondary | ICD-10-CM

## 2015-01-19 DIAGNOSIS — I340 Nonrheumatic mitral (valve) insufficiency: Secondary | ICD-10-CM | POA: Diagnosis present

## 2015-01-19 DIAGNOSIS — R0603 Acute respiratory distress: Secondary | ICD-10-CM

## 2015-01-19 DIAGNOSIS — J9601 Acute respiratory failure with hypoxia: Secondary | ICD-10-CM | POA: Diagnosis present

## 2015-01-19 DIAGNOSIS — R0600 Dyspnea, unspecified: Secondary | ICD-10-CM

## 2015-01-19 DIAGNOSIS — E1165 Type 2 diabetes mellitus with hyperglycemia: Secondary | ICD-10-CM | POA: Diagnosis present

## 2015-01-19 DIAGNOSIS — E876 Hypokalemia: Secondary | ICD-10-CM | POA: Diagnosis present

## 2015-01-19 DIAGNOSIS — Z6841 Body Mass Index (BMI) 40.0 and over, adult: Secondary | ICD-10-CM

## 2015-01-19 DIAGNOSIS — J9602 Acute respiratory failure with hypercapnia: Secondary | ICD-10-CM | POA: Diagnosis present

## 2015-01-19 HISTORY — DX: Hypothyroidism, unspecified: E03.9

## 2015-01-19 HISTORY — DX: Unspecified atrial flutter: I48.92

## 2015-01-19 HISTORY — DX: Essential (primary) hypertension: I10

## 2015-01-19 LAB — CBC WITH DIFFERENTIAL/PLATELET
BASOS ABS: 0.1 10*3/uL (ref 0.0–0.1)
BASOS PCT: 0 % (ref 0–1)
EOS ABS: 0.6 10*3/uL (ref 0.0–0.7)
EOS PCT: 5 % (ref 0–5)
HEMATOCRIT: 40.1 % (ref 36.0–46.0)
HEMOGLOBIN: 12.3 g/dL (ref 12.0–15.0)
LYMPHS ABS: 5 10*3/uL — AB (ref 0.7–4.0)
LYMPHS PCT: 37 % (ref 12–46)
MCH: 25.5 pg — AB (ref 26.0–34.0)
MCHC: 30.7 g/dL (ref 30.0–36.0)
MCV: 83 fL (ref 78.0–100.0)
MONO ABS: 0.6 10*3/uL (ref 0.1–1.0)
MONOS PCT: 4 % (ref 3–12)
NEUTROS ABS: 7.2 10*3/uL (ref 1.7–7.7)
NEUTROS PCT: 54 % (ref 43–77)
PLATELETS: 237 10*3/uL (ref 150–400)
RBC: 4.83 MIL/uL (ref 3.87–5.11)
RDW: 16.5 % — ABNORMAL HIGH (ref 11.5–15.5)
WBC: 13.4 10*3/uL — ABNORMAL HIGH (ref 4.0–10.5)

## 2015-01-19 LAB — BLOOD GAS, ARTERIAL
ACID-BASE DEFICIT: 4.5 mmol/L — AB (ref 0.0–2.0)
Bicarbonate: 21.4 mEq/L (ref 20.0–24.0)
Drawn by: 317771
FIO2: 1 %
O2 SAT: 96.5 %
PH ART: 7.264 — AB (ref 7.350–7.450)
PO2 ART: 106 mmHg — AB (ref 80.0–100.0)
Patient temperature: 37
TCO2: 19.8 mmol/L (ref 0–100)
pCO2 arterial: 48.8 mmHg — ABNORMAL HIGH (ref 35.0–45.0)

## 2015-01-19 LAB — BASIC METABOLIC PANEL
Anion gap: 9 (ref 5–15)
BUN: 25 mg/dL — ABNORMAL HIGH (ref 6–23)
CHLORIDE: 104 mmol/L (ref 96–112)
CO2: 23 mmol/L (ref 19–32)
Calcium: 8.7 mg/dL (ref 8.4–10.5)
Creatinine, Ser: 1.48 mg/dL — ABNORMAL HIGH (ref 0.50–1.10)
GFR calc non Af Amer: 38 mL/min — ABNORMAL LOW (ref >90)
GFR, EST AFRICAN AMERICAN: 45 mL/min — AB (ref >90)
Glucose, Bld: 277 mg/dL — ABNORMAL HIGH (ref 70–99)
Potassium: 4 mmol/L (ref 3.5–5.1)
Sodium: 136 mmol/L (ref 135–145)

## 2015-01-19 LAB — D-DIMER, QUANTITATIVE (NOT AT ARMC): D DIMER QUANT: 3.13 ug{FEU}/mL — AB (ref 0.00–0.48)

## 2015-01-19 LAB — PROTIME-INR
INR: 1.15 (ref 0.00–1.49)
PROTHROMBIN TIME: 14.8 s (ref 11.6–15.2)

## 2015-01-19 LAB — TROPONIN I: TROPONIN I: 0.03 ng/mL (ref <0.031)

## 2015-01-19 LAB — BRAIN NATRIURETIC PEPTIDE: B NATRIURETIC PEPTIDE 5: 688 pg/mL — AB (ref 0.0–100.0)

## 2015-01-19 MED ORDER — IPRATROPIUM-ALBUTEROL 0.5-2.5 (3) MG/3ML IN SOLN: 3.0000 mL | Freq: Once | RESPIRATORY_TRACT | Status: DC

## 2015-01-19 MED ORDER — IOHEXOL 350 MG/ML SOLN
80.0000 mL | Freq: Once | INTRAVENOUS | Status: AC | PRN
  Administered 2015-01-19: 80 mL via INTRAVENOUS

## 2015-01-19 MED ORDER — DILTIAZEM LOAD VIA INFUSION
20.0000 mg | Freq: Once | INTRAVENOUS | Status: AC
  Administered 2015-01-19: 20 mg via INTRAVENOUS

## 2015-01-19 MED ORDER — ASPIRIN 81 MG PO CHEW
324.0000 mg | CHEWABLE_TABLET | Freq: Once | ORAL | Status: AC
  Administered 2015-01-19: 324 mg via ORAL
  Filled 2015-01-19: qty 4

## 2015-01-19 MED ORDER — DILTIAZEM HCL 100 MG IV SOLR
5.0000 mg/h | INTRAVENOUS | Status: DC
  Administered 2015-01-19: 5 mg/h via INTRAVENOUS
  Administered 2015-01-20: 15 mg/h via INTRAVENOUS
  Administered 2015-01-20 (×2): 20 mg/h via INTRAVENOUS

## 2015-01-19 MED ORDER — METHYLPREDNISOLONE SODIUM SUCC 125 MG IJ SOLR
125.0000 mg | Freq: Once | INTRAMUSCULAR | Status: AC
  Administered 2015-01-19: 125 mg via INTRAVENOUS
  Filled 2015-01-19: qty 2

## 2015-01-19 MED ORDER — FUROSEMIDE 10 MG/ML IJ SOLN
40.0000 mg | Freq: Once | INTRAMUSCULAR | Status: AC
  Administered 2015-01-19: 40 mg via INTRAVENOUS
  Filled 2015-01-19: qty 4

## 2015-01-19 NOTE — ED Notes (Signed)
Pt in by [facility] with resp distress that pt states started yesterday.  Pt denies cp or other complaints

## 2015-01-19 NOTE — ED Provider Notes (Signed)
This chart was scribed for [place], DO by Randa Evens, ED Scribe. This patient was seen in room APA18/APA18 and the patient's care was started at 9:24 PM.  TIME SEEN: 9:24 PM   CHIEF COMPLAINT: Respiratory Distress   HPI: HPI Comments: Isabella Hall is a 59 y.o. female hypertension, atrial fibrillation on diltiazem but not on anticoagulation who presents to the Emergency Department complaining of new sudden worsening difficulty breathing onset 1 day prior. Pt states she has had an albuterol breathing treatment PTA that didn't provide any relief  Pt states she is not an everyday smoker. Pt denies Hx of DVT or PE's. Pt denies Hx of  COPD or Asthma, heart failure, CAD.  Pt denies taking any exogenous estrogen use, recent prolonged immobilization such as long flight or hospitalization, fracture, surgery, trauma. Denies history of prior MI. Denies lower extremity swelling or pain. Denies history of CHF.  Denies CP, fever or cough.    ROS: See HPI Constitutional: no fever  Eyes: no drainage  ENT: no runny nose   Cardiovascular:  no chest pain  Resp: SOB  GI: no vomiting GU: no dysuria Integumentary: no rash  Allergy: no hives  Musculoskeletal: no leg swelling  Neurological: no slurred speech ROS otherwise negative  PAST MEDICAL HISTORY/PAST SURGICAL HISTORY:  Past Medical History  Diagnosis Date  . Hypertension   . Thyroid disease   . Palpitation   . Atrial fibrillation 11/11/2012    2D Echo EF 55%-60% normal left ventricular size and systolic function with very mild left ventricular hypertrophy.  . CAD (coronary artery disease)   . Obesity     MEDICATIONS:  Prior to Admission medications   Medication Sig Start Date End Date Taking? Authorizing Provider  Ascorbic Acid (VITAMIN C) 1000 MG tablet Take 1,000 mg by mouth daily.    Historical Provider, MD  aspirin EC 81 MG tablet Take 81 mg by mouth daily.    Historical Provider, MD  CHOLECALCIFEROL PO Take 1 tablet by  mouth daily.    Historical Provider, MD  Cyanocobalamin (VITAMIN B 12 PO) Take 1 tablet by mouth daily.    Historical Provider, MD  diltiazem (CARDIZEM) 120 MG tablet Take 1 tablet (120 mg total) by mouth 2 (two) times daily. 11/11/12   Robert Bellow, MD  GARLIC PO Take 1 tablet by mouth daily.    Historical Provider, MD  hydrochlorothiazide (HYDRODIURIL) 25 MG tablet Take 25 mg by mouth daily.    Historical Provider, MD  levothyroxine (SYNTHROID) 150 MCG tablet Take 1 tablet (150 mcg total) by mouth daily. 11/11/12   Robert Bellow, MD  Pyridoxine HCl (VITAMIN B-6 PO) Take 1 tablet by mouth daily.     Historical Provider, MD  sodium chloride (OCEAN) 0.65 % nasal spray Place 1 spray into both nostrils 3 (three) times daily as needed. Uses daily, up to three times per day during allergy season    Historical Provider, MD  Thiamine HCl (B-1 PO) Take 1 tablet by mouth daily.    Historical Provider, MD    ALLERGIES:  No Known Allergies  SOCIAL HISTORY:  History  Substance Use Topics  . Smoking status: Never Smoker   . Smokeless tobacco: Not on file  . Alcohol Use: No    FAMILY HISTORY: Family History  Problem Relation Age of Onset  . Atrial fibrillation Neg Hx   . Diabetes Neg Hx   . Hypertension    . Coronary artery disease Sister  coronary stents  . Cancer Mother   . Heart disease Father   . Cancer Maternal Grandmother     EXAM: BP 116/97 mmHg  Pulse 96  Temp(Src) 98 F (36.7 C) (Oral)  Resp 24  Ht 5\' 7"  (1.702 m)  Wt 208 lb (94.348 kg)  BMI 32.57 kg/m2  SpO2 100%   CONSTITUTIONAL: Alert and oriented and responds appropriately to questions. Diaphoretic. Patient is in moderate respiratory distress. Speaking short sentences.  HEAD: Normocephalic EYES: Conjunctivae clear, PERRL ENT: normal nose; no rhinorrhea; moist mucous membranes; pharynx without lesions noted NECK: Supple, no meningismus, no LAD  CARD: irregularly irregular and tachycardic; S1 and S2  appreciated; no murmurs, no clicks, no rubs, no gallops RESP:  Tachypneic; mild diffuse expiratory wheezing, bibasilar Rales, no rhonchi, patient is in moderate respiratory distress, speaking short sentences ABD/GI: Normal bowel sounds; non-distended; soft, non-tender, no rebound, no guarding BACK:  The back appears normal and is non-tender to palpation, there is no CVA tenderness EXT: Normal ROM in all joints; non-tender to palpation; no edema; normal capillary refill; no cyanosis; no calf tenderness or swelling   SKIN: Normal color for age and race; warm; diaphoretic NEURO: Moves all extremities equally PSYCH: The patient's mood and manner are appropriate. Grooming and personal hygiene are appropriate.  MEDICAL DECISION MAKING: Patient here in respiratory distress. Differential diagnosis includes pneumonia, pulmonary edema, pulmonary embolus. She is in atrial for relation with RVR. We'll start diltiazem drip and give a bolus. We'll also give aspirin. Patient denies chest pain or chest discomfort. Will obtain cardiac labs including troponin, BNP, d-dimer, portable chest x-ray. We'll also obtain ABG. She is extremely tachycardic at this time. We'll hold on further albuterol.  ED PROGRESS: ABG shows a respiratory acidosis with a PCO2 of 48.8, bicarbonate 21.4. Heart rate is now in the 140s on 15 mg per hour of IV diltiazem. Will increase to 20. She does have a mild leukocytosis of 13.4. Troponin negative. Her d-dimer is significantly elevated. BNP 688. She has received a dose of IV Lasix, IV Solu-Medrol. We'll obtain a CT image of her chest to evaluate for pulmonary embolus. Chest x-ray shows possible pulmonary edema and questionable pleural effusions.  11:45 PM  Pt's CT scan shows no pulmonary embolus. She does have bilateral pleural effusions and pulmonary edema. We'll discuss with hospitalist for admission. Heart rate is now in the 140s.  12:00 AM  D/w Dr. Maudie Mercury for admission to step down,  inpatient.    EKG Interpretation  Date/Time:  Tuesday January 19 2015 21:25:53 EST Ventricular Rate:  201 PR Interval:    QRS Duration: 83 QT Interval:  269 QTC Calculation: 492 R Axis:   63 Text Interpretation:  Atrial fibrillation with rapid V-rate Ventricular premature complex Anteroseptal infarct, age indeterminate Confirmed by Leontae Bostock,  DO, Mackay Hanauer ([id number]) on 01/19/2015 9:29:46 PM       CRITICAL CARE Performed by: Nyra Jabs   Total critical care time: 45 minutes  Critical care time was exclusive of separately billable procedures and treating other patients.  Critical care was necessary to treat or prevent imminent or life-threatening deterioration.  Critical care was time spent personally by me on the following activities: development of treatment plan with patient and/or surrogate as well as nursing, discussions with consultants, evaluation of patient's response to treatment, examination of patient, obtaining history from patient or surrogate, ordering and performing treatments and interventions, ordering and review of laboratory studies, ordering and review of radiographic studies, pulse oximetry and re-evaluation  of patient's condition.    I personally performed the services described in this documentation, which was scribed in my presence. The recorded information has been reviewed and is accurate.        [place], DO 01/20/15 0007

## 2015-01-19 NOTE — ED Notes (Signed)
Per dr ward the Cardizem can be increased to 20mg /hr

## 2015-01-20 ENCOUNTER — Encounter (HOSPITAL_COMMUNITY): Payer: Self-pay | Admitting: Internal Medicine

## 2015-01-20 DIAGNOSIS — E119 Type 2 diabetes mellitus without complications: Secondary | ICD-10-CM

## 2015-01-20 DIAGNOSIS — Z79899 Other long term (current) drug therapy: Secondary | ICD-10-CM | POA: Diagnosis not present

## 2015-01-20 DIAGNOSIS — Z8249 Family history of ischemic heart disease and other diseases of the circulatory system: Secondary | ICD-10-CM | POA: Diagnosis not present

## 2015-01-20 DIAGNOSIS — E876 Hypokalemia: Secondary | ICD-10-CM | POA: Diagnosis present

## 2015-01-20 DIAGNOSIS — R7989 Other specified abnormal findings of blood chemistry: Secondary | ICD-10-CM

## 2015-01-20 DIAGNOSIS — I481 Persistent atrial fibrillation: Secondary | ICD-10-CM | POA: Diagnosis present

## 2015-01-20 DIAGNOSIS — J9601 Acute respiratory failure with hypoxia: Secondary | ICD-10-CM | POA: Diagnosis present

## 2015-01-20 DIAGNOSIS — I340 Nonrheumatic mitral (valve) insufficiency: Secondary | ICD-10-CM | POA: Diagnosis present

## 2015-01-20 DIAGNOSIS — J9602 Acute respiratory failure with hypercapnia: Secondary | ICD-10-CM | POA: Diagnosis present

## 2015-01-20 DIAGNOSIS — I509 Heart failure, unspecified: Secondary | ICD-10-CM

## 2015-01-20 DIAGNOSIS — I10 Essential (primary) hypertension: Secondary | ICD-10-CM | POA: Diagnosis present

## 2015-01-20 DIAGNOSIS — E1122 Type 2 diabetes mellitus with diabetic chronic kidney disease: Secondary | ICD-10-CM

## 2015-01-20 DIAGNOSIS — R778 Other specified abnormalities of plasma proteins: Secondary | ICD-10-CM

## 2015-01-20 DIAGNOSIS — G4730 Sleep apnea, unspecified: Secondary | ICD-10-CM | POA: Diagnosis present

## 2015-01-20 DIAGNOSIS — I248 Other forms of acute ischemic heart disease: Secondary | ICD-10-CM | POA: Diagnosis present

## 2015-01-20 DIAGNOSIS — N1832 Chronic kidney disease, stage 3b: Secondary | ICD-10-CM

## 2015-01-20 DIAGNOSIS — Z7982 Long term (current) use of aspirin: Secondary | ICD-10-CM | POA: Diagnosis not present

## 2015-01-20 DIAGNOSIS — E669 Obesity, unspecified: Secondary | ICD-10-CM | POA: Diagnosis present

## 2015-01-20 DIAGNOSIS — I4581 Long QT syndrome: Secondary | ICD-10-CM | POA: Diagnosis present

## 2015-01-20 DIAGNOSIS — N289 Disorder of kidney and ureter, unspecified: Secondary | ICD-10-CM

## 2015-01-20 DIAGNOSIS — N183 Chronic kidney disease, stage 3 (moderate): Secondary | ICD-10-CM | POA: Diagnosis present

## 2015-01-20 DIAGNOSIS — I4892 Unspecified atrial flutter: Secondary | ICD-10-CM | POA: Diagnosis not present

## 2015-01-20 DIAGNOSIS — I480 Paroxysmal atrial fibrillation: Secondary | ICD-10-CM | POA: Insufficient documentation

## 2015-01-20 DIAGNOSIS — I429 Cardiomyopathy, unspecified: Secondary | ICD-10-CM | POA: Diagnosis present

## 2015-01-20 DIAGNOSIS — E1165 Type 2 diabetes mellitus with hyperglycemia: Secondary | ICD-10-CM

## 2015-01-20 DIAGNOSIS — R0600 Dyspnea, unspecified: Secondary | ICD-10-CM

## 2015-01-20 DIAGNOSIS — E039 Hypothyroidism, unspecified: Secondary | ICD-10-CM | POA: Diagnosis present

## 2015-01-20 DIAGNOSIS — I4891 Unspecified atrial fibrillation: Secondary | ICD-10-CM | POA: Insufficient documentation

## 2015-01-20 DIAGNOSIS — E872 Acidosis: Secondary | ICD-10-CM | POA: Diagnosis present

## 2015-01-20 DIAGNOSIS — I2510 Atherosclerotic heart disease of native coronary artery without angina pectoris: Secondary | ICD-10-CM | POA: Diagnosis present

## 2015-01-20 DIAGNOSIS — Z6841 Body Mass Index (BMI) 40.0 and over, adult: Secondary | ICD-10-CM | POA: Diagnosis not present

## 2015-01-20 DIAGNOSIS — I129 Hypertensive chronic kidney disease with stage 1 through stage 4 chronic kidney disease, or unspecified chronic kidney disease: Secondary | ICD-10-CM | POA: Diagnosis present

## 2015-01-20 DIAGNOSIS — I5031 Acute diastolic (congestive) heart failure: Secondary | ICD-10-CM | POA: Diagnosis present

## 2015-01-20 LAB — BASIC METABOLIC PANEL
Anion gap: 10 (ref 5–15)
BUN: 28 mg/dL — AB (ref 6–23)
CALCIUM: 8.9 mg/dL (ref 8.4–10.5)
CO2: 24 mmol/L (ref 19–32)
Chloride: 102 mmol/L (ref 96–112)
Creatinine, Ser: 1.42 mg/dL — ABNORMAL HIGH (ref 0.50–1.10)
GFR calc Af Amer: 47 mL/min — ABNORMAL LOW (ref >90)
GFR calc non Af Amer: 40 mL/min — ABNORMAL LOW (ref >90)
Glucose, Bld: 262 mg/dL — ABNORMAL HIGH (ref 70–99)
Potassium: 4 mmol/L (ref 3.5–5.1)
Sodium: 136 mmol/L (ref 135–145)

## 2015-01-20 LAB — GLUCOSE, CAPILLARY
GLUCOSE-CAPILLARY: 216 mg/dL — AB (ref 70–99)
GLUCOSE-CAPILLARY: 131 mg/dL — AB (ref 70–99)
Glucose-Capillary: 253 mg/dL — ABNORMAL HIGH (ref 70–99)
Glucose-Capillary: 271 mg/dL — ABNORMAL HIGH (ref 70–99)
Glucose-Capillary: 151 mg/dL — ABNORMAL HIGH (ref 70–99)

## 2015-01-20 LAB — TROPONIN I
TROPONIN I: 0.13 ng/mL — AB (ref <0.031)
TROPONIN I: 0.06 ng/mL — AB (ref <0.031)
Troponin I: 0.08 ng/mL — ABNORMAL HIGH (ref <0.031)

## 2015-01-20 LAB — TSH: TSH: 1.323 u[IU]/mL (ref 0.350–4.500)

## 2015-01-20 LAB — MRSA PCR SCREENING: MRSA BY PCR: NEGATIVE

## 2015-01-20 LAB — BRAIN NATRIURETIC PEPTIDE: B NATRIURETIC PEPTIDE 5: 496 pg/mL — AB (ref 0.0–100.0)

## 2015-01-20 MED ORDER — SODIUM CHLORIDE 0.9 % IJ SOLN: 3.0000 mL | INTRAMUSCULAR | Status: DC | PRN

## 2015-01-20 MED ORDER — INSULIN ASPART 100 UNIT/ML ~~LOC~~ SOLN
0.0000 [IU] | Freq: Three times a day (TID) | SUBCUTANEOUS | Status: DC
  Administered 2015-01-20: 3 [IU] via SUBCUTANEOUS
  Administered 2015-01-20: 1 [IU] via SUBCUTANEOUS
  Administered 2015-01-20: 5 [IU] via SUBCUTANEOUS
  Administered 2015-01-21 – 2015-01-22 (×2): 1 [IU] via SUBCUTANEOUS
  Administered 2015-01-22: 2 [IU] via SUBCUTANEOUS
  Administered 2015-01-23 – 2015-01-29 (×7): 1 [IU] via SUBCUTANEOUS

## 2015-01-20 MED ORDER — LEVOTHYROXINE SODIUM 150 MCG PO TABS
150.0000 ug | ORAL_TABLET | Freq: Every day | ORAL | Status: DC
  Administered 2015-01-20 – 2015-01-29 (×10): 150 ug via ORAL
  Filled 2015-01-20: qty 1
  Filled 2015-01-20: qty 2
  Filled 2015-01-20: qty 1
  Filled 2015-01-20: qty 2
  Filled 2015-01-20: qty 1
  Filled 2015-01-20 (×3): qty 2
  Filled 2015-01-20: qty 1
  Filled 2015-01-20: qty 2
  Filled 2015-01-20: qty 1
  Filled 2015-01-20: qty 2
  Filled 2015-01-20: qty 1

## 2015-01-20 MED ORDER — ENOXAPARIN SODIUM 100 MG/ML ~~LOC~~ SOLN
1.0000 mg/kg | Freq: Once | SUBCUTANEOUS | Status: AC
  Administered 2015-01-20: 95 mg via SUBCUTANEOUS
  Filled 2015-01-20: qty 1

## 2015-01-20 MED ORDER — SALINE SPRAY 0.65 % NA SOLN: 1.0000 | NASAL | Status: DC | PRN

## 2015-01-20 MED ORDER — METOPROLOL TARTRATE 1 MG/ML IV SOLN
5.0000 mg | Freq: Once | INTRAVENOUS | Status: AC
  Administered 2015-01-20: 5 mg via INTRAVENOUS
  Filled 2015-01-20: qty 5

## 2015-01-20 MED ORDER — INSULIN ASPART 100 UNIT/ML ~~LOC~~ SOLN
0.0000 [IU] | Freq: Every day | SUBCUTANEOUS | Status: DC
Start: 2015-01-20 — End: 2015-01-29
  Administered 2015-01-20: 2 [IU] via SUBCUTANEOUS

## 2015-01-20 MED ORDER — AMIODARONE IV BOLUS ONLY 150 MG/100ML
INTRAVENOUS | Status: AC
  Administered 2015-01-20: 150 mg
  Filled 2015-01-20: qty 100

## 2015-01-20 MED ORDER — AMIODARONE HCL 150 MG/3ML IV SOLN: 150.0000 mg | Freq: Once | INTRAVENOUS | Status: DC

## 2015-01-20 MED ORDER — AMIODARONE IV BOLUS ONLY 150 MG/100ML
150.0000 mg | Freq: Once | INTRAVENOUS | Status: AC
  Administered 2015-01-20: 150 mg via INTRAVENOUS

## 2015-01-20 MED ORDER — DIGOXIN 0.25 MG/ML IJ SOLN
0.2500 mg | Freq: Once | INTRAMUSCULAR | Status: AC
  Administered 2015-01-20: 0.25 mg via INTRAVENOUS
  Filled 2015-01-20: qty 2

## 2015-01-20 MED ORDER — SODIUM CHLORIDE 0.9 % IJ SOLN
3.0000 mL | Freq: Two times a day (BID) | INTRAMUSCULAR | Status: DC
  Administered 2015-01-21 – 2015-01-26 (×4): 3 mL via INTRAVENOUS

## 2015-01-20 MED ORDER — PNEUMOCOCCAL VAC POLYVALENT 25 MCG/0.5ML IJ INJ
0.5000 mL | INJECTION | INTRAMUSCULAR | Status: AC
  Administered 2015-01-21: 0.5 mL via INTRAMUSCULAR
  Filled 2015-01-20: qty 0.5

## 2015-01-20 MED ORDER — SODIUM CHLORIDE 0.9 % IV SOLN: 250.0000 mL | INTRAVENOUS | Status: DC | PRN

## 2015-01-20 MED ORDER — DILTIAZEM HCL 100 MG IV SOLR
20.0000 mg/h | Freq: Once | INTRAVENOUS | Status: AC
  Administered 2015-01-20: 20 mg/h via INTRAVENOUS

## 2015-01-20 MED ORDER — ASPIRIN EC 81 MG PO TBEC
81.0000 mg | DELAYED_RELEASE_TABLET | Freq: Every day | ORAL | Status: DC
  Administered 2015-01-20 – 2015-01-25 (×6): 81 mg via ORAL
  Filled 2015-01-20 (×6): qty 1

## 2015-01-20 MED ORDER — METOPROLOL TARTRATE 25 MG PO TABS
25.0000 mg | ORAL_TABLET | Freq: Three times a day (TID) | ORAL | Status: DC
  Administered 2015-01-20 (×2): 25 mg via ORAL
  Filled 2015-01-20 (×2): qty 1

## 2015-01-20 MED ORDER — SODIUM CHLORIDE 0.9 % IJ SOLN
3.0000 mL | Freq: Two times a day (BID) | INTRAMUSCULAR | Status: DC
  Administered 2015-01-20 – 2015-01-26 (×6): 3 mL via INTRAVENOUS

## 2015-01-20 NOTE — Progress Notes (Signed)
Pt transferred to unit on BIPAP, patient got up and walked from stretcher to bed, was talking nonstop laughing and joking around. PT stated she felt so much better, took patient off BIPAP and placed her on [facility]. BIPAP is still on standby by bedside if needed. RT will continue to monitor.

## 2015-01-20 NOTE — Progress Notes (Signed)
Pt responded well to iv lopressor and amiodarone drip. Remain in atrial fib in controled rate in the 70's and 80's.

## 2015-01-20 NOTE — Progress Notes (Signed)
PROGRESS NOTE  Isabella Hall [id number] DOB: 1956-06-12 DOA: 01/19/2015 PCP: Robert Bellow, MD  Summary: 59 year old woman with history of atrial fibrillation not on anticoagulation presented to the emergency department with shortness of breath, found to have significant tachycardia/rapid ventricular response with associated hypercapnic respiratory failure requiring BiPAP.  Assessment/Plan: 1. Acute hypoxic and hypercapnic respiratory failure with respiratory acidosis. Appears much improved. Likely secondary to rapid heart rate. CT chest negative for pulmonary embolism, infiltrate. Some pleural effusion seen and "CHF". 2. Atrial fibrillation with rapid ventricular response, h/o PAF, not on anticoagulation. CHADS2 = 4. 3. Elevated troponin. Favor demand ischemia. Trending downwards. Asymptomatic. 4. Elevated BNP favor related to rapid rate.  5. Bilateral pleural effusions and pulmonary edema 6. CKD stage III, stable.  7. Hypothyroidism TSH normal this admission. . 8. Diabetes mellitus. Stable. 9. History of coronary artery disease 10. Obesity   Appears stable. Plan rate-control and anticoagulation per cardiology.   Wean oxygen  Follow-up echocardiogram  BMP in the morning  Follow-up hemoglobin A1c  Code Status: full code DVT prophylaxis: Lovenox Family Communication:  Disposition Plan: home  Murray Hodgkins, MD  [facility]  Pager [phone] If 7PM-7AM, please contact night-coverage at [email or web address], password [facility] 01/20/2015, 7:55 AM  LOS: 1 day   Consultants:  Cardiology   Procedures:  2-d echocardiogram pending  Antibiotics:    HPI/Subjective: Feels much better, "I can breathe". No chest pain, no n/v.  Objective: Filed Vitals:   01/20/15 0515 01/20/15 0530 01/20/15 0545 01/20/15 0600  BP: 123/83 128/85 127/99 118/90  Pulse: 47 69 75 81  Temp:      TempSrc:      Resp: 19 20 17 20   Height:      Weight:      SpO2: 99% 99% 98% 98%     Intake/Output Summary (Last 24 hours) at 01/20/15 0755 Last data filed at 01/20/15 0700  Gross per 24 hour  Intake      0 ml  Output   1100 ml  Net  -1100 ml     Filed Weights   01/19/15 2124 01/20/15 0106 01/20/15 0500  Weight: 94.348 kg (208 lb) 130.5 kg (287 lb 11.2 oz) 130.5 kg (287 lb 11.2 oz)    Exam:     Afebrile, vital signs stable, but HR 140s General:  Appears calm and comfortable Eyes: appear grossly normal ENT: grossly normal hearing, lips  Cardiovascular: tachycardic, irregular, no m/r/g. No LE edema. Telemetry: atrial fibrillation with RVR  Respiratory: CTA bilaterally, no w/r/r. Normal respiratory effort. Skin: no rash or induration seen  Musculoskeletal: grossly normal tone BUE/BLE Psychiatric: grossly normal mood and affect, speech fluent and appropriate Neurologic: grossly non-focal.  Data Reviewed:  BMP noted, creatinine stable.  Pertinent data: Admission Labs  Troponins 0.03 >> .13 >> .08  BNP 688 on admission  ABG on admission 7.26/48/106  TSH 1.323   Imaging   Chest x-ray suggested CHF.  CT chest no PE. CHF. Bilateral pleural effusions.  Other  EKG atrial fibrillation rapid ventricular response, supraventricular tachycardia on second EKG   Pending data:  Hemoglobin A1c   Scheduled Meds: . aspirin EC  81 mg Oral Daily  . insulin aspart  0-5 Units Subcutaneous QHS  . insulin aspart  0-9 Units Subcutaneous TID WC  . levothyroxine  150 mcg Oral QAC breakfast  . [START ON 01/21/2015] pneumococcal 23 valent vaccine  0.5 mL Intramuscular Tomorrow-1000  . sodium chloride  3 mL Intravenous Q12H  . sodium chloride  3  mL Intravenous Q12H   Continuous Infusions: . diltiazem (CARDIZEM) infusion 20 mg/hr (01/20/15 0600)    Principal Problem:   Acute CHF- secondary to AF with RVR Active Problems:   Atrial fibrillation with RVR   Dyspnea   Diabetes type 2, uncontrolled   PAF- last episode 2013   Hypertension, poor control    Hypothyroid- TSH WNL   Obesity-BMI 45   Renal insufficiency-stage 3   Acute respiratory failure with hypoxia   Elevated troponin   CKD (chronic kidney disease), stage III   Time spent 25 minutes

## 2015-01-20 NOTE — Care Management Utilization Note (Signed)
UR completed 

## 2015-01-20 NOTE — Consult Note (Signed)
Reason for Consult:   AF with RVR, CHF  Requesting Physician: Dr Maudie Mercury  HPI: This is a 59 y.o. female with a past medical history significant for PAF. Her last episode was in Nov 2013. She reportedly had A-flutter as well as AF. She was seen by [facility] as an OP in 2014 and had a negative Met test (nor records in [facility]). She has not had recurrent PAF since 2013. She was in her usual state of health until about three days ago when she noted increasing dyspnea. She told me she did not know her HR was fast or irregular. She presented to the ED 01/19/15 and was found to be in rapid AF with CHF. Since admission she has been noted to have BS > 200, elevated B/P, and SCr 1.48. Her HR remains uncontrolled despite Diltiazem 20 mg/hr IV and Lanoxin 0.25 mg x 1. She did receive IV Lasix 40 mg x 1 in the ED and diuresed 1L. Symptomatically she is improved, less SOB this am though her HR is still 120-150.   PMHx:  Past Medical History  Diagnosis Date  . Hypertension   . Thyroid disease   . Palpitation   . Atrial fibrillation 11/11/2012    2D Echo EF 55%-60% normal left ventricular size and systolic function with very mild left ventricular hypertrophy.  . CAD (coronary artery disease)   . Obesity     Past Surgical History  Procedure Laterality Date  . Cholecystectomy  1979  . Tubal ligation  1980    SOCHx:  reports that she has never smoked. She does not have any smokeless tobacco history on file. She reports that she does not drink alcohol or use illicit drugs.  FAMHx: Family History  Problem Relation Age of Onset  . Atrial fibrillation Neg Hx   . Diabetes Neg Hx   . Hypertension    . Coronary artery disease Sister     coronary stents  . Cancer Mother   . Heart disease Father   . Cancer Maternal Grandmother     ALLERGIES: No Known Allergies  ROS: Pertinent items are noted in HPI. see H7P for complete ROS. No Hx of recent OTC medication use, no recent illness, fever, or  chills.  HOME MEDICATIONS: Prior to Admission medications   Medication Sig Start Date End Date Taking? Authorizing Provider  Ascorbic Acid (VITAMIN C) 1000 MG tablet Take 1,000 mg by mouth daily.    Historical Provider, MD  aspirin EC 81 MG tablet Take 81 mg by mouth daily.    Historical Provider, MD  CHOLECALCIFEROL PO Take 1 tablet by mouth daily.    Historical Provider, MD  Cyanocobalamin (VITAMIN B 12 PO) Take 1 tablet by mouth daily.    Historical Provider, MD  diltiazem (CARDIZEM) 120 MG tablet Take 1 tablet (120 mg total) by mouth 2 (two) times daily. 11/11/12   Robert Bellow, MD  GARLIC PO Take 1 tablet by mouth daily.    Historical Provider, MD  hydrochlorothiazide (HYDRODIURIL) 25 MG tablet Take 25 mg by mouth daily.    Historical Provider, MD  levothyroxine (SYNTHROID) 150 MCG tablet Take 1 tablet (150 mcg total) by mouth daily. 11/11/12   Robert Bellow, MD  Pyridoxine HCl (VITAMIN B-6 PO) Take 1 tablet by mouth daily.     Historical Provider, MD  sodium chloride (OCEAN) 0.65 % nasal spray Place 1 spray into both nostrils 3 (three) times daily as needed. Uses  daily, up to three times per day during allergy season    Historical Provider, MD  Thiamine HCl (B-1 PO) Take 1 tablet by mouth daily.    Historical Provider, MD    HOSPITAL MEDICATIONS: I have reviewed the patient's current medications.  VITALS: Blood pressure 152/135, pulse 148, temperature 97.5 F (36.4 C), temperature source Oral, resp. rate 23, height 5' 6"  (1.676 m), weight 287 lb 11.2 oz (130.5 kg), SpO2 98 %.  PHYSICAL EXAM: General appearance: alert, cooperative, no distress and morbidly obese Neck: no carotid bruit and no JVD Lungs: decreased breath sounds Heart: irregularly irregular rhythm Abdomen: obese Extremities: no edema Pulses: 2+ and symmetric Skin: Skin color, texture, turgor normal. No rashes or lesions Neurologic: Grossly normal  LABS: Results for orders placed or performed during  the hospital encounter of 01/19/15 (from the past 24 hour(s))  CBC with Differential     Status: Abnormal   Collection Time: 01/19/15  9:12 PM  Result Value Ref Range   WBC 13.4 (H) 4.0 - 10.5 K/uL   RBC 4.83 3.87 - 5.11 MIL/uL   Hemoglobin 12.3 12.0 - 15.0 g/dL   HCT 40.1 36.0 - 46.0 %   MCV 83.0 78.0 - 100.0 fL   MCH 25.5 (L) 26.0 - 34.0 pg   MCHC 30.7 30.0 - 36.0 g/dL   RDW 16.5 (H) 11.5 - 15.5 %   Platelets 237 150 - 400 K/uL   Neutrophils Relative % 54 43 - 77 %   Neutro Abs 7.2 1.7 - 7.7 K/uL   Lymphocytes Relative 37 12 - 46 %   Lymphs Abs 5.0 (H) 0.7 - 4.0 K/uL   Monocytes Relative 4 3 - 12 %   Monocytes Absolute 0.6 0.1 - 1.0 K/uL   Eosinophils Relative 5 0 - 5 %   Eosinophils Absolute 0.6 0.0 - 0.7 K/uL   Basophils Relative 0 0 - 1 %   Basophils Absolute 0.1 0.0 - 0.1 K/uL  Basic metabolic panel     Status: Abnormal   Collection Time: 01/19/15  9:12 PM  Result Value Ref Range   Sodium 136 135 - 145 mmol/L   Potassium 4.0 3.5 - 5.1 mmol/L   Chloride 104 96 - 112 mmol/L   CO2 23 19 - 32 mmol/L   Glucose, Bld 277 (H) 70 - 99 mg/dL   BUN 25 (H) 6 - 23 mg/dL   Creatinine, Ser 1.48 (H) 0.50 - 1.10 mg/dL   Calcium 8.7 8.4 - 10.5 mg/dL   GFR calc non Af Amer 38 (L) >90 mL/min   GFR calc Af Amer 45 (L) >90 mL/min   Anion gap 9 5 - 15  Troponin I     Status: None   Collection Time: 01/19/15  9:12 PM  Result Value Ref Range   Troponin I 0.03 <0.031 ng/mL  D-dimer, quantitative     Status: Abnormal   Collection Time: 01/19/15  9:12 PM  Result Value Ref Range   D-Dimer, Quant 3.13 (H) 0.00 - 0.48 ug/mL-FEU  Protime-INR     Status: None   Collection Time: 01/19/15  9:12 PM  Result Value Ref Range   Prothrombin Time 14.8 11.6 - 15.2 seconds   INR 1.15 0.00 - 1.49  Brain natriuretic peptide     Status: Abnormal   Collection Time: 01/19/15  9:13 PM  Result Value Ref Range   B Natriuretic Peptide 688.0 (H) 0.0 - 100.0 pg/mL  Blood gas, arterial ([facility] & [facility] ONLY)  Status:  Abnormal   Collection Time: 01/19/15  9:24 PM  Result Value Ref Range   FIO2 1.00 %   Delivery systems NON-REBREATHER OXYGEN MASK    pH, Arterial 7.264 (L) 7.350 - 7.450   pCO2 arterial 48.8 (H) 35.0 - 45.0 mmHg   pO2, Arterial 106.0 (H) 80.0 - 100.0 mmHg   Bicarbonate 21.4 20.0 - 24.0 mEq/L   TCO2 19.8 0 - 100 mmol/L   Acid-base deficit 4.5 (H) 0.0 - 2.0 mmol/L   O2 Saturation 96.5 %   Patient temperature 37.0    Collection site RIGHT RADIAL    Drawn by [id number]    Sample type ARTERIAL DRAW    Allens test (pass/fail) PASS PASS  MRSA PCR Screening     Status: None   Collection Time: 01/20/15  1:10 AM  Result Value Ref Range   MRSA by PCR NEGATIVE NEGATIVE  TSH     Status: None   Collection Time: 01/20/15  1:22 AM  Result Value Ref Range   TSH 1.323 0.350 - 4.500 uIU/mL  Troponin I (q 6hr x 3)     Status: Abnormal   Collection Time: 01/20/15  1:22 AM  Result Value Ref Range   Troponin I 0.13 (H) <0.031 ng/mL  Brain natriuretic peptide     Status: Abnormal   Collection Time: 01/20/15  1:22 AM  Result Value Ref Range   B Natriuretic Peptide 496.0 (H) 0.0 - 100.0 pg/mL  Glucose, capillary     Status: Abnormal   Collection Time: 01/20/15  1:33 AM  Result Value Ref Range   Glucose-Capillary 216 (H) 70 - 99 mg/dL  Troponin I (q 6hr x 3)     Status: Abnormal   Collection Time: 01/20/15  7:09 AM  Result Value Ref Range   Troponin I 0.08 (H) <0.031 ng/mL  Basic metabolic panel     Status: Abnormal   Collection Time: 01/20/15  7:09 AM  Result Value Ref Range   Sodium 136 135 - 145 mmol/L   Potassium 4.0 3.5 - 5.1 mmol/L   Chloride 102 96 - 112 mmol/L   CO2 24 19 - 32 mmol/L   Glucose, Bld 262 (H) 70 - 99 mg/dL   BUN 28 (H) 6 - 23 mg/dL   Creatinine, Ser 1.42 (H) 0.50 - 1.10 mg/dL   Calcium 8.9 8.4 - 10.5 mg/dL   GFR calc non Af Amer 40 (L) >90 mL/min   GFR calc Af Amer 47 (L) >90 mL/min   Anion gap 10 5 - 15  Glucose, capillary     Status: Abnormal   Collection Time:  01/20/15  8:03 AM  Result Value Ref Range   Glucose-Capillary 253 (H) 70 - 99 mg/dL   Comment 1 Documented in Chart    Comment 2 Notify RN     EKG: AF with RVR  IMAGING: Ct Angio Chest Pe W/cm &/or Wo Cm  01/19/2015   CLINICAL DATA:  Respiratory distress. Shortness of breath and diaphoresis for 1 day.  EXAM: CT ANGIOGRAPHY CHEST WITH CONTRAST  .  IMPRESSION: 1. No pulmonary embolus. 2. Findings consistent with CHF with cardiomegaly, bilateral pleural effusions and diffuse ground-glass opacities likely pulmonary edema.   Electronically Signed   By: Jeb Levering M.D.   On: 01/19/2015 23:36   Dg Chest Port 1 View  01/19/2015   CLINICAL DATA:  Dyspnea and respiratory distress. Symptoms for 1 day. Initial encounter.  EXAM: PORTABLE CHEST - 1 VIEW    IMPRESSION:  Cardiomegaly and probable pulmonary edema, findings suggest CHF. Questionable pleural effusions versus soft tissue attenuation from body habitus.   Electronically Signed   By: Jeb Levering M.D.   On: 01/19/2015 21:39    IMPRESSION: Principal Problem:   Acute CHF- secondary to AF with RVR Active Problems:   Atrial fibrillation with RVR   Diabetes type 2, uncontrolled   PAF- last episode 2013   Hypertension, poor control   Renal insufficiency-stage 3   Dyspnea   Hypothyroid- TSH WNL   Obesity-BMI 45   RECOMMENDATION: Will review with MD. She has a CHA2DS2-VASc score of 4 for female, CHF, HTN, and DM. She is currently on full dose Lovenox. Onset of her AF is unclear but probably > 48 hrs. If we have to cardiovert before discharge she will need a TEE. Either way she will need long term anticoagulation (pt would prefer a NOAC). I added Lopressor for rate control. I rescheduled her echo for tomorrow when her HR hopefully will be under better control. She may need another dose of Lasix (I did not order). Treatment of HTN, DM, obesity, and renal insufficiency per primary team.   Time Spent Directly with Patient: 50  minutes  Erlene Quan [phone] beeper 01/20/2015, 10:19 AM   The patient was seen and examined, and I agree with the assessment and plan as documented above, with modifications as noted below. Pt with aforementioned history admitted with acute hypoxic respiratory failure and found to have rapid atrial fibrillation with consequent CHF. Will hold off on echocardiogram until 2/4 when a more accurate assessment of LV systolic function can be made with optimal HR control. Agree with addition of metoprolol for rate control. If this strategy fails, I would consider IV amiodarone bolus and infusion (I will given one bolus of amiodarone now). She will need anticoagulation given CHADSVASC score of 4, which I explained to the patient. She is in agreement. Will use a target-specific factor Xa inhibitor.

## 2015-01-20 NOTE — H&P (Signed)
Isabella Hall is an 59 y.o. female.    Newt Minion (pcp)  Chief Complaint: dyspnea HPI: 59yo female with Pafib, Hypothyroidism c/o dyspnea this evening and palpitation.  Pt called EMS and was brought to the ED and found to be in Afib with RVR, CTA chest was negative for PE, ? Slight chf.  Pt denies cp.  Her trop was negative.  Pt's bs was 277.  No prior hx of dm2.  Pt will be admitted for Afib with RVR and new onset dm2.    Past Medical History  Diagnosis Date  . Hypertension   . Thyroid disease   . Palpitation   . Atrial fibrillation 11/11/2012    2D Echo EF 55%-60% normal left ventricular size and systolic function with very mild left ventricular hypertrophy.  . CAD (coronary artery disease)   . Obesity     Past Surgical History  Procedure Laterality Date  . Cholecystectomy  1979  . Tubal ligation  1980    Family History  Problem Relation Age of Onset  . Atrial fibrillation Neg Hx   . Diabetes Neg Hx   . Hypertension    . Coronary artery disease Sister     coronary stents  . Cancer Mother   . Heart disease Father   . Cancer Maternal Grandmother    Social History:  reports that she has never smoked. She does not have any smokeless tobacco history on file. She reports that she does not drink alcohol or use illicit drugs.  Allergies: No Known Allergies Medications reviewed  (Not in a hospital admission)  Results for orders placed or performed during the hospital encounter of 01/19/15 (from the past 48 hour(s))  CBC with Differential     Status: Abnormal   Collection Time: 01/19/15  9:12 PM  Result Value Ref Range   WBC 13.4 (H) 4.0 - 10.5 K/uL   RBC 4.83 3.87 - 5.11 MIL/uL   Hemoglobin 12.3 12.0 - 15.0 g/dL   HCT 40.1 36.0 - 46.0 %   MCV 83.0 78.0 - 100.0 fL   MCH 25.5 (L) 26.0 - 34.0 pg   MCHC 30.7 30.0 - 36.0 g/dL   RDW 16.5 (H) 11.5 - 15.5 %   Platelets 237 150 - 400 K/uL   Neutrophils Relative % 54 43 - 77 %   Neutro Abs 7.2 1.7 - 7.7 K/uL    Lymphocytes Relative 37 12 - 46 %   Lymphs Abs 5.0 (H) 0.7 - 4.0 K/uL   Monocytes Relative 4 3 - 12 %   Monocytes Absolute 0.6 0.1 - 1.0 K/uL   Eosinophils Relative 5 0 - 5 %   Eosinophils Absolute 0.6 0.0 - 0.7 K/uL   Basophils Relative 0 0 - 1 %   Basophils Absolute 0.1 0.0 - 0.1 K/uL  Basic metabolic panel     Status: Abnormal   Collection Time: 01/19/15  9:12 PM  Result Value Ref Range   Sodium 136 135 - 145 mmol/L   Potassium 4.0 3.5 - 5.1 mmol/L   Chloride 104 96 - 112 mmol/L   CO2 23 19 - 32 mmol/L   Glucose, Bld 277 (H) 70 - 99 mg/dL   BUN 25 (H) 6 - 23 mg/dL   Creatinine, Ser 1.48 (H) 0.50 - 1.10 mg/dL   Calcium 8.7 8.4 - 10.5 mg/dL   GFR calc non Af Amer 38 (L) >90 mL/min   GFR calc Af Amer 45 (L) >90 mL/min    Comment: (NOTE)  The eGFR has been calculated using the CKD EPI equation. This calculation has not been validated in all clinical situations. eGFR's persistently <90 mL/min signify possible Chronic Kidney Disease.    Anion gap 9 5 - 15  Troponin I     Status: None   Collection Time: 01/19/15  9:12 PM  Result Value Ref Range   Troponin I 0.03 <0.031 ng/mL    Comment:        NO INDICATION OF MYOCARDIAL INJURY.   D-dimer, quantitative     Status: Abnormal   Collection Time: 01/19/15  9:12 PM  Result Value Ref Range   D-Dimer, Quant 3.13 (H) 0.00 - 0.48 ug/mL-FEU    Comment:        AT THE INHOUSE ESTABLISHED CUTOFF VALUE OF 0.48 ug/mL FEU, THIS ASSAY HAS BEEN DOCUMENTED IN THE LITERATURE TO HAVE A SENSITIVITY AND NEGATIVE PREDICTIVE VALUE OF AT LEAST 98 TO 99%.  THE TEST RESULT SHOULD BE CORRELATED WITH AN ASSESSMENT OF THE CLINICAL PROBABILITY OF DVT / VTE.   Protime-INR     Status: None   Collection Time: 01/19/15  9:12 PM  Result Value Ref Range   Prothrombin Time 14.8 11.6 - 15.2 seconds   INR 1.15 0.00 - 1.49  Brain natriuretic peptide     Status: Abnormal   Collection Time: 01/19/15  9:13 PM  Result Value Ref Range   B Natriuretic Peptide  688.0 (H) 0.0 - 100.0 pg/mL  Blood gas, arterial ([facility] & [facility] ONLY)     Status: Abnormal   Collection Time: 01/19/15  9:24 PM  Result Value Ref Range   FIO2 1.00 %   Delivery systems NON-REBREATHER OXYGEN MASK    pH, Arterial 7.264 (L) 7.350 - 7.450   pCO2 arterial 48.8 (H) 35.0 - 45.0 mmHg   pO2, Arterial 106.0 (H) 80.0 - 100.0 mmHg   Bicarbonate 21.4 20.0 - 24.0 mEq/L   TCO2 19.8 0 - 100 mmol/L   Acid-base deficit 4.5 (H) 0.0 - 2.0 mmol/L   O2 Saturation 96.5 %   Patient temperature 37.0    Collection site RIGHT RADIAL    Drawn by [id number]    Sample type ARTERIAL DRAW    Allens test (pass/fail) PASS PASS   Ct Angio Chest Pe W/cm &/or Wo Cm  01/19/2015   CLINICAL DATA:  Respiratory distress. Shortness of breath and diaphoresis for 1 day.  EXAM: CT ANGIOGRAPHY CHEST WITH CONTRAST  TECHNIQUE: Multidetector CT imaging of the chest was performed using the standard protocol during bolus administration of intravenous contrast. Multiplanar CT image reconstructions and MIPs were obtained to evaluate the vascular anatomy.  CONTRAST:  37m OMNIPAQUE IOHEXOL 350 MG/ML SOLN  COMPARISON:  Radiograph earlier this day.  FINDINGS: There are no filling defects within the pulmonary arteries to suggest pulmonary embolus.  The thoracic aorta is normal in caliber without evidence of dissection. Heart is enlarged. Mild reflux of contrast into the IVC. There are bilateral pleural effusions with adjacent atelectasis in the lower lobes. Mild diffuse ground-glass opacities suggestive of pulmonary edema. There is central bronchial thickening. No focal consolidation to suggest pneumonia. No pulmonary mass. Scattered linear atelectasis throughout both lung. There are small superior mediastinal lymph nodes are likely reactive. No hilar adenopathy. No axillary adenopathy. There is no pericardial effusion.  No definite acute abnormality in the included upper abdomen. There are no acute or suspicious osseous abnormalities.  Review of  the MIP images confirms the above findings.  IMPRESSION: 1. No pulmonary embolus.  2. Findings consistent with CHF with cardiomegaly, bilateral pleural effusions and diffuse ground-glass opacities likely pulmonary edema.   Electronically Signed   By: Jeb Levering M.D.   On: 01/19/2015 23:36   Dg Chest Port 1 View  01/19/2015   CLINICAL DATA:  Dyspnea and respiratory distress. Symptoms for 1 day. Initial encounter.  EXAM: PORTABLE CHEST - 1 VIEW  COMPARISON:  11/09/2012  FINDINGS: The heart is enlarged. There is perivascular haziness concerning for pulmonary edema and mild vascular congestion. Ill-defined hazy opacity at the lung bases, may reflect small pleural effusions versus soft tissue attenuation from body habitus. No pneumothorax. No confluent airspace disease to suggest pneumonia. No acute osseous abnormalities are seen.  IMPRESSION: Cardiomegaly and probable pulmonary edema, findings suggest CHF. Questionable pleural effusions versus soft tissue attenuation from body habitus.   Electronically Signed   By: Jeb Levering M.D.   On: 01/19/2015 21:39    Review of Systems  Constitutional: Negative for fever, chills, weight loss, malaise/fatigue and diaphoresis.  HENT: Negative for congestion, ear discharge, ear pain, hearing loss, nosebleeds, sore throat and tinnitus.   Eyes: Negative for blurred vision, double vision, photophobia, pain, discharge and redness.  Respiratory: Positive for shortness of breath. Negative for cough, hemoptysis, sputum production, wheezing and stridor.   Cardiovascular: Positive for palpitations. Negative for chest pain, orthopnea, claudication, leg swelling and PND.  Gastrointestinal: Negative for heartburn, nausea, vomiting, abdominal pain, diarrhea, constipation, blood in stool and melena.  Genitourinary: Negative for dysuria, urgency, frequency, hematuria and flank pain.  Musculoskeletal: Negative for myalgias, back pain, joint pain, falls and neck pain.  Skin:  Negative for itching and rash.  Neurological: Negative for dizziness, tingling, tremors, sensory change, speech change, focal weakness, seizures, loss of consciousness, weakness and headaches.  Endo/Heme/Allergies: Negative for environmental allergies and polydipsia. Does not bruise/bleed easily.  Psychiatric/Behavioral: Negative for depression, suicidal ideas, hallucinations, memory loss and substance abuse. The patient is not nervous/anxious and does not have insomnia.     Blood pressure 123/103, pulse 114, temperature 98 F (36.7 C), temperature source Oral, resp. rate 25, height _0  (1.702 m), weight 94.348 kg (208 lb), SpO2 100 %. Physical Exam  Constitutional: She is oriented to person, place, and time. She appears well-developed and well-nourished.  HENT:  Head: Normocephalic and atraumatic.  Eyes: Conjunctivae and EOM are normal. Pupils are equal, round, and reactive to light.  Neck: Normal range of motion. Neck supple. No JVD present. No tracheal deviation present. No thyromegaly present.  Cardiovascular: Exam reveals no gallop and no friction rub.   No murmur heard. Irr, irr s1, s2  Respiratory: No respiratory distress. She has no wheezes. She has no rales.  GI: Soft. Bowel sounds are normal. She exhibits no distension. There is no tenderness. There is no rebound and no guarding.  Musculoskeletal: Normal range of motion. She exhibits no edema or tenderness.  Lymphadenopathy:    She has no cervical adenopathy.  Neurological: She is alert and oriented to person, place, and time. She has normal reflexes. She displays normal reflexes. No cranial nerve deficit. She exhibits normal muscle tone. Coordination normal.  Skin: Skin is warm and dry. No rash noted. No erythema. No pallor.  Psychiatric: She has a normal mood and affect. Her behavior is normal. Judgment and thought content normal.     Assessment/Plan Afib with RVR (chads2=2) Tele Cycle cardiac markers Tsh Check  echo lovenox 34m/kg [place] x1 Cont cardizem iv  CHF (EF 50%), secondary to afib with rvr  Lasix 64m iv x1  Dm2 fsbs ac and qhs, iss Check hga1c  Hypothyroidism Check tsh    KJani Gravel2/02/2015, 12:21 AM

## 2015-01-20 NOTE — Progress Notes (Signed)
Inpatient Diabetes Program Recommendations  AACE/ADA: New Consensus Statement on Inpatient Glycemic Control (2013)  Target Ranges:  Prepandial:   less than 140 mg/dL      Peak postprandial:   less than 180 mg/dL (1-2 hours)      Critically ill patients:  140 - 180 mg/dL   Results for Isabella Hall, Isabella Hall (MRN [id number]) as of 01/20/2015 08:19  Ref. Range 01/20/2015 01:33 01/20/2015 08:03  Glucose-Capillary Latest Range: 70-99 mg/dL 216 (H) 253 (H)    Diabetes history: DM2 Outpatient Diabetes medications: None Current orders for Inpatient glycemic control: Novolog 0-9 units TID with meals, Novolog 0-5 units QHS  Inpatient Diabetes Program Recommendations Correction (SSI): Please consider increasing Novolog correction to Resistant scale. HgbA1C: A1C has been ordered and is in process. Diet: When diet is resumed, please order Carb Modified diet.  Thanks, Barnie Alderman, RN, MSN, CCRN, CDE Diabetes Coordinator Inpatient Diabetes Program [phone] (Team Pager) [phone] ([facility] office) [phone] [facility] office)

## 2015-01-21 DIAGNOSIS — J9601 Acute respiratory failure with hypoxia: Secondary | ICD-10-CM

## 2015-01-21 DIAGNOSIS — I4891 Unspecified atrial fibrillation: Secondary | ICD-10-CM

## 2015-01-21 DIAGNOSIS — J810 Acute pulmonary edema: Secondary | ICD-10-CM | POA: Insufficient documentation

## 2015-01-21 LAB — HEMOGLOBIN A1C
Hgb A1c MFr Bld: 6.8 % — ABNORMAL HIGH (ref 4.8–5.6)
MEAN PLASMA GLUCOSE: 148 mg/dL

## 2015-01-21 LAB — CBC
HCT: 34 % — ABNORMAL LOW (ref 36.0–46.0)
Hemoglobin: 10.5 g/dL — ABNORMAL LOW (ref 12.0–15.0)
MCH: 25.1 pg — AB (ref 26.0–34.0)
MCHC: 30.9 g/dL (ref 30.0–36.0)
MCV: 81.1 fL (ref 78.0–100.0)
PLATELETS: 178 10*3/uL (ref 150–400)
RBC: 4.19 MIL/uL (ref 3.87–5.11)
RDW: 16.5 % — ABNORMAL HIGH (ref 11.5–15.5)
WBC: 10 10*3/uL (ref 4.0–10.5)

## 2015-01-21 LAB — GLUCOSE, CAPILLARY
GLUCOSE-CAPILLARY: 119 mg/dL — AB (ref 70–99)
GLUCOSE-CAPILLARY: 126 mg/dL — AB (ref 70–99)
Glucose-Capillary: 128 mg/dL — ABNORMAL HIGH (ref 70–99)
Glucose-Capillary: 98 mg/dL (ref 70–99)

## 2015-01-21 LAB — LIPID PANEL
CHOLESTEROL: 147 mg/dL (ref 0–200)
HDL: 45 mg/dL (ref >39)
LDL CALC: 90 mg/dL (ref 0–99)
TRIGLYCERIDES: 61 mg/dL (ref <150)
Total CHOL/HDL Ratio: 3.3 RATIO
VLDL: 12 mg/dL (ref 0–40)

## 2015-01-21 LAB — BASIC METABOLIC PANEL
Anion gap: 5 (ref 5–15)
BUN: 39 mg/dL — ABNORMAL HIGH (ref 6–23)
CALCIUM: 8.3 mg/dL — AB (ref 8.4–10.5)
CO2: 28 mmol/L (ref 19–32)
Chloride: 106 mmol/L (ref 96–112)
Creatinine, Ser: 1.44 mg/dL — ABNORMAL HIGH (ref 0.50–1.10)
GFR, EST AFRICAN AMERICAN: 46 mL/min — AB (ref >90)
GFR, EST NON AFRICAN AMERICAN: 40 mL/min — AB (ref >90)
Glucose, Bld: 140 mg/dL — ABNORMAL HIGH (ref 70–99)
POTASSIUM: 3.7 mmol/L (ref 3.5–5.1)
Sodium: 139 mmol/L (ref 135–145)

## 2015-01-21 MED ORDER — RIVAROXABAN 20 MG PO TABS
20.0000 mg | ORAL_TABLET | Freq: Every day | ORAL | Status: DC
  Administered 2015-01-21 – 2015-01-25 (×5): 20 mg via ORAL
  Filled 2015-01-21 (×5): qty 1

## 2015-01-21 MED ORDER — DILTIAZEM HCL ER COATED BEADS 240 MG PO CP24
240.0000 mg | ORAL_CAPSULE | Freq: Every day | ORAL | Status: DC
  Administered 2015-01-21 – 2015-01-22 (×2): 240 mg via ORAL
  Filled 2015-01-21 (×2): qty 1

## 2015-01-21 NOTE — Discharge Instructions (Addendum)
**  PLEASE REMEMBER TO BRING ALL OF YOUR MEDICATIONS TO EACH OF YOUR FOLLOW-UP OFFICE VISITS.  Information on my medicine - XARELTO (Rivaroxaban)  This medication education was reviewed with me or my healthcare representative as part of my discharge preparation.  The pharmacist that spoke with me during my hospital stay was:  Ena Dawley, [facility]  Why was Xarelto prescribed for you? Xarelto was prescribed for you to reduce the risk of a blood clot forming that can cause a stroke if you have a medical condition called atrial fibrillation (a type of irregular heartbeat).  What do you need to know about xarelto ? Take your Xarelto ONCE DAILY at the same time every day with your evening meal. If you have difficulty swallowing the tablet whole, you may crush it and mix in applesauce just prior to taking your dose.  Take Xarelto exactly as prescribed by your doctor and DO NOT stop taking Xarelto without talking to the doctor who prescribed the medication.  Stopping without other stroke prevention medication to take the place of Xarelto may increase your risk of developing a clot that causes a stroke.  Refill your prescription before you run out.  After discharge, you should have regular check-up appointments with your healthcare provider that is prescribing your Xarelto.  In the future your dose may need to be changed if your kidney function or weight changes by a significant amount.  What do you do if you miss a dose? If you are taking Xarelto ONCE DAILY and you miss a dose, take it as soon as you remember on the same day then continue your regularly scheduled once daily regimen the next day. Do not take two doses of Xarelto at the same time or on the same day.   Important Safety Information A possible side effect of Xarelto is bleeding. You should call your healthcare provider right away if you experience any of the following: ? Bleeding from an injury or your nose that does not  stop. ? Unusual colored urine (red or dark brown) or unusual colored stools (red or black). ? Unusual bruising for unknown reasons. ? A serious fall or if you hit your head (even if there is no bleeding).  Some medicines may interact with Xarelto and might increase your risk of bleeding while on Xarelto. To help avoid this, consult your healthcare provider or pharmacist prior to using any new prescription or non-prescription medications, including herbals, vitamins, non-steroidal anti-inflammatory drugs (NSAIDs) and supplements.  This website has more information on Xarelto: [email or web address]

## 2015-01-21 NOTE — Progress Notes (Signed)
PROGRESS NOTE  Isabella Hall [id number] DOB: February 13, 1956 DOA: 01/19/2015 PCP: Robert Bellow, MD  Summary: 59 year old woman with history of atrial fibrillation not on anticoagulation presented to the emergency department with shortness of breath, found to have significant tachycardia/rapid ventricular response with associated hypercapnic respiratory failure requiring BiPAP.  Assessment/Plan: 1. Acute hypoxic and hypercapnic respiratory failure with respiratory acidosis. Clinically resolved, plan to wean oxygen. 2. Atrial fibrillation with rapid ventricular response, h/o PAF, not on anticoagulation. CHADS2 = 4. Converted to sinus rhythm on amiodarone and IV Lopressor. Off diltiazem drip. 3. Elevated troponin. Favor demand ischemia. Trending downwards. Further management defer to cardiology. Asymptomatic. 4. Elevated BNP favor related to rapid rate rather than CHF. Consider diuretic if echocardiogram warrants. 5. Bilateral pleural effusions and pulmonary edema; asymptomatic. 6. CKD stage III, remains stable. 7. Hypothyroidism TSH normal this admission. . 8. Diabetes mellitus. Stable. 9. History of coronary artery disease 10. Obesity   Overall much improved, now in SR.  Xarelto per cardiology.  Wean oxygen  Follow-up echocardiogram  Transfer to floor  Home 2/5?  Code Status: full code DVT prophylaxis: Lovenox Family Communication:  Disposition Plan: home  Murray Hodgkins, MD  [facility]  Pager [phone] If 7PM-7AM, please contact night-coverage at [email or web address], password [facility] 01/21/2015, 8:46 AM  LOS: 2 days   Consultants:  Cardiology   Procedures:  2-d echocardiogram pending  Antibiotics:    HPI/Subjective: Converted to sinus rhythm.  Feels good, no pain, no SOB. No complaints. Slept well.  Objective: Filed Vitals:   01/21/15 0715 01/21/15 0730 01/21/15 0745 01/21/15 0800  BP: 138/90     Pulse: 62 72 116 72  Temp:      TempSrc:      Resp:  21 25 26 17   Height:      Weight:      SpO2: 100% 95% 88% 98%    Intake/Output Summary (Last 24 hours) at 01/21/15 0846 Last data filed at 01/21/15 0800  Gross per 24 hour  Intake    480 ml  Output    550 ml  Net    -70 ml     Filed Weights   01/20/15 0106 01/20/15 0500 01/21/15 0500  Weight: 130.5 kg (287 lb 11.2 oz) 130.5 kg (287 lb 11.2 oz) 128.2 kg (282 lb 10.1 oz)    Exam:     Afebrile,  VSS General:  Appears comfortable, calm. Cardiovascular: Regular rate and rhythm, no murmur, rub or gallop. No lower extremity edema. Telemetry: Sinus rhythm, no arrhythmias  Respiratory: Clear to auscultation bilaterally, no wheezes, rales or rhonchi. Normal respiratory effort. Psychiatric: grossly normal mood and affect, speech fluent and appropriate  Data Reviewed:  Urine output 550. -1.1 L since admission.  Capillary blood sugar stable.  BUN 39, creatinine stable 1.44.  Leukocytosis has resolved. Hemoglobin 10.5, suspect at baseline.  Pertinent data: Admission Labs  Troponins 0.03 >> .13 >> .08 >> 0.06  BNP 688 on admission  ABG on admission 7.26/48/106  TSH 1.323   Creatinine 1.48 >> 1.44. Baseline actually 1.3  Hemoglobin A1c 6.8  Imaging   Chest x-ray suggested CHF.  CT chest no PE. CHF. Bilateral pleural effusions.  Other  EKG atrial fibrillation rapid ventricular response, supraventricular tachycardia on second EKG   Pending data:    Scheduled Meds: . aspirin EC  81 mg Oral Daily  . insulin aspart  0-5 Units Subcutaneous QHS  . insulin aspart  0-9 Units Subcutaneous TID WC  . levothyroxine  150 mcg Oral  QAC breakfast  . metoprolol tartrate  25 mg Oral TID  . pneumococcal 23 valent vaccine  0.5 mL Intramuscular Tomorrow-1000  . rivaroxaban  20 mg Oral Daily  . sodium chloride  3 mL Intravenous Q12H  . sodium chloride  3 mL Intravenous Q12H   Continuous Infusions:    Principal Problem:   Acute CHF- secondary to AF with RVR Active  Problems:   Atrial fibrillation with RVR   Dyspnea   Diabetes type 2, uncontrolled   PAF- last episode 2013   Hypertension, poor control   Hypothyroid- TSH WNL   Obesity-BMI 45   Renal insufficiency-stage 3   Acute respiratory failure with hypoxia   Elevated troponin   CKD (chronic kidney disease), stage III   Time spent 20 minutes

## 2015-01-21 NOTE — Progress Notes (Signed)
  Echocardiogram 2D Echocardiogram has been performed.  Lysle Rubens 01/21/2015, 1:46 PM

## 2015-01-21 NOTE — Progress Notes (Signed)
Pt's heart rate elevated 130's-140's, rhythm converts back and forth from sinus rhythm to atrial fib. Pt asymptomatic. Sitting up in chair conversing with family. Dr. Sarajane Jews paged and made aware. Awaiting new orders at this time.

## 2015-01-21 NOTE — Progress Notes (Signed)
Consulting cardiologist: Kate Sable Primary Cardiologist: [facility]  Cardiology Specific Problem List: 1. Atrial fibrillation 2. CAD 3. Hypertension 4.CHF   Subjective:    Feels great! No dyspnea, chest pain or weakness.   Objective:   Temp:  [97.6 F (36.4 C)-98.3 F (36.8 C)] 97.6 F (36.4 C) (02/04 0400) Pulse Rate:  [53-162] 60 (02/04 0600) Resp:  [10-29] 10 (02/04 0600) BP: (111-147)/(67-110) 122/70 mmHg (02/04 0500) SpO2:  [96 %-100 %] 100 % (02/04 0600) Weight:  [282 lb 10.1 oz (128.2 kg)] 282 lb 10.1 oz (128.2 kg) (02/04 0500) Last BM Date: 01/19/15 (per patient)  Filed Weights   01/20/15 0106 01/20/15 0500 01/21/15 0500  Weight: 287 lb 11.2 oz (130.5 kg) 287 lb 11.2 oz (130.5 kg) 282 lb 10.1 oz (128.2 kg)    Intake/Output Summary (Last 24 hours) at 01/21/15 0745 Last data filed at 01/21/15 0500  Gross per 24 hour  Intake    480 ml  Output    550 ml  Net    -70 ml   Echocardiogram: 10/2012 Left ventricle: The cavity size was normal. There was mild concentric hypertrophy. Systolic function was normal. The estimated ejection fraction was in the range of 55% to 60%. Wall motion was normal; there were no regional wall motion abnormalities. - Left atrium: The atrium was mildly dilated. - Atrial septum: No defect or patent foramen ovale was identified.  Telemetry: NSR rate of 54 bpm.  Exam:  General: No acute distress.  HEENT: Conjunctiva and lids normal, oropharynx clear.  Lungs: Clear to auscultation, nonlabored.  Cardiac: No elevated JVP or bruits. RRR, no gallop or rub.   Abdomen: Normoactive bowel sounds, nontender, nondistended.  Extremities: No pitting edema, distal pulses full.  Neuropsychiatric: Alert and oriented x3, affect appropriate.   Lab Results:  Basic Metabolic Panel:  Recent Labs Lab 01/19/15 2112 01/20/15 0709 01/21/15 0447  NA 136 136 139  K 4.0 4.0 3.7  CL 104 102 106  CO2  23 24 28   GLUCOSE 277* 262* 140*  BUN 25* 28* 39*  CREATININE 1.48* 1.42* 1.44*  CALCIUM 8.7 8.9 8.3*   CBC:  Recent Labs Lab 01/19/15 2112 01/21/15 0447  WBC 13.4* 10.0  HGB 12.3 10.5*  HCT 40.1 34.0*  MCV 83.0 81.1  PLT 237 178    Cardiac Enzymes:  Recent Labs Lab 01/20/15 0122 01/20/15 0709 01/20/15 1342  TROPONINI 0.13* 0.08* 0.06*    Coagulation:  Recent Labs Lab 01/19/15 2112  INR 1.15    Radiology: Ct Angio Chest Pe W/cm &/or Wo Cm  01/19/2015   CLINICAL DATA:  Respiratory distress. Shortness of breath and diaphoresis for 1 day.  EXAM: CT ANGIOGRAPHY CHEST WITH CONTRAST  TECHNIQUE: Multidetector CT imaging of the chest was performed using the standard protocol during bolus administration of intravenous contrast. Multiplanar CT image reconstructions and MIPs were obtained to evaluate the vascular anatomy.  CONTRAST:  36mL OMNIPAQUE IOHEXOL 350 MG/ML SOLN  COMPARISON:  Radiograph earlier this day.  FINDINGS: There are no filling defects within the pulmonary arteries to suggest pulmonary embolus.  The thoracic aorta is normal in caliber without evidence of dissection. Heart is enlarged. Mild reflux of contrast into the IVC. There are bilateral pleural effusions with adjacent atelectasis in the lower lobes. Mild diffuse ground-glass opacities suggestive of pulmonary edema. There is central bronchial thickening. No focal consolidation to suggest pneumonia. No pulmonary mass. Scattered linear atelectasis throughout both lung. There are small superior mediastinal lymph nodes are  likely reactive. No hilar adenopathy. No axillary adenopathy. There is no pericardial effusion.  No definite acute abnormality in the included upper abdomen. There are no acute or suspicious osseous abnormalities.  Review of the MIP images confirms the above findings.  IMPRESSION: 1. No pulmonary embolus. 2. Findings consistent with CHF with cardiomegaly, bilateral pleural effusions and diffuse  ground-glass opacities likely pulmonary edema.   Electronically Signed   By: Jeb Levering M.D.   On: 01/19/2015 23:36   Dg Chest Port 1 View  01/19/2015   CLINICAL DATA:  Dyspnea and respiratory distress. Symptoms for 1 day. Initial encounter.  EXAM: PORTABLE CHEST - 1 VIEW  COMPARISON:  11/09/2012  FINDINGS: The heart is enlarged. There is perivascular haziness concerning for pulmonary edema and mild vascular congestion. Ill-defined hazy opacity at the lung bases, may reflect small pleural effusions versus soft tissue attenuation from body habitus. No pneumothorax. No confluent airspace disease to suggest pneumonia. No acute osseous abnormalities are seen.  IMPRESSION: Cardiomegaly and probable pulmonary edema, findings suggest CHF. Questionable pleural effusions versus soft tissue attenuation from body habitus.   Electronically Signed   By: Jeb Levering M.D.   On: 01/19/2015 21:39     [id number] this am. Converted to NSR   Medications:   Scheduled Medications: . aspirin EC  81 mg Oral Daily  . insulin aspart  0-5 Units Subcutaneous QHS  . insulin aspart  0-9 Units Subcutaneous TID WC  . levothyroxine  150 mcg Oral QAC breakfast  . metoprolol tartrate  25 mg Oral TID  . pneumococcal 23 valent vaccine  0.5 mL Intramuscular Tomorrow-1000  . sodium chloride  3 mL Intravenous Q12H  . sodium chloride  3 mL Intravenous Q12H    Infusions: . diltiazem (CARDIZEM) infusion 15 mg/hr (01/20/15 1001)    PRN Medications: sodium chloride, sodium chloride, sodium chloride   Assessment and Plan:   1.Atrial fib with RVR: Now converted to NSR rates in the 50's to 60's. She was given on dose of amiodarone and IV lopressor. Now on po metoprolol 25 mg BID. Diltiazem gtt is discontinued. She is due for echo this am. I will repeat the EKG for documentation of rhythm conversion. CHADS VASC score of 4. Will start Xarelto 20 mg daily.   2. Hypertension: Currently well controlled on metoprolol only.   Was on diltiazem 120 mg daily at home Await echo for LV fx now that HR is controlled to assist in management.   3. CHF: She has diuresed 1.7 liters since admission. She has no evidence of fluid retention on exam. Will consider adding diuretic once echo completed if necessary. CHF likely due to rapid rhythm when in atrial fib.  4. CAD: May consider repeating stress test as OP. She has had mildly elevated troponin, likely due to demand ischemia. She has CVRF of obesity, hypertension, FH, DM, with slightly elevated LDL in 2013. Will need to check this in am.    Phill Myron. Lawrence NP AACC  01/21/2015, 7:45 AM   The patient was seen and examined, and I agree with the assessment and plan as documented above, with modifications as noted below. After receiving 150 mg bolus of IV amiodarone, she converted to normal sinus rhythm.  Had been taking short-acting diltiazem twice daily as outpatient. I will d/c metoprolol tartrate and switch to Cardizem CD 240 mg daily. I recommend she be given a prescription for metoprolol tartrate 25 mg po prn for rapid palpitations. Lipids reasonably controlled. Can hold off on  stress testing with trivial troponin elevation. BP borderline controlled. Will need continued monitoring. Will add Xarelto 20 mg daily for anticoagulation. Obtain echocardiogram today.  She can be transferred to floor. Hopefully can be discharged on 2/5.

## 2015-01-21 NOTE — Care Management Note (Addendum)
    Page 1 of 1   01/22/2015     2:09:12 PM CARE MANAGEMENT NOTE 01/22/2015  Patient:  Isabella Hall, Isabella Hall   Account Number:  [id number]  Date Initiated:  01/21/2015  Documentation initiated by:  Jolene Provost  Subjective/Objective Assessment:   Pt is from home with self care. Pt independent at baseline with no HH services, DME's or med needs prior to admission. Pt plans to discharge home with self care. Pt plans to discharge home on Xeralto.     Action/Plan:   Benefits check complted for xeralto 20mg  Daily. No pre-auth needed and pt's co-pay will be $40. Pt says this is okay. Pt given xeralto saving card. No further CM needs identified.   Anticipated DC Date:  01/22/2015   Anticipated DC Plan:  [place]  CM consult      Choice offered to / List presented to:             Status of service:  Completed, signed off Medicare Important Message given?   (If response is "NO", the following Medicare IM given date fields will be blank) Date Medicare IM given:   Medicare IM given by:   Date Additional Medicare IM given:   Additional Medicare IM given by:    Discharge Disposition:  HOME/SELF CARE  Per UR Regulation:  Reviewed for med. necessity/level of care/duration of stay  If discussed at [place] of Stay Meetings, dates discussed:    Comments:  01/22/2015 [place], RN, MSN, CM If pt discharge home over weekend, there are no further CM needs identified. 01/21/2015 [place], RN, MSN, CM

## 2015-01-21 NOTE — Progress Notes (Signed)
Spoke with Dr. Sarajane Jews. States we will continue to monitor patient at this time. No new orders given.

## 2015-01-21 NOTE — Progress Notes (Signed)
PT TRANSFERRING TO ROOM 313. ALERT AND ORIENTED. HR 70'S IN NSR.O2 SAT 98% ON ROOM AIR. DENIES ANY SOB OR DISCOMFORT. RT FOREARM NSL PATENT.TRANFER REPORT CALLED TO JESSICA MAYES RN ON 300.

## 2015-01-22 DIAGNOSIS — I340 Nonrheumatic mitral (valve) insufficiency: Secondary | ICD-10-CM | POA: Insufficient documentation

## 2015-01-22 DIAGNOSIS — R079 Chest pain, unspecified: Secondary | ICD-10-CM

## 2015-01-22 DIAGNOSIS — I248 Other forms of acute ischemic heart disease: Secondary | ICD-10-CM | POA: Insufficient documentation

## 2015-01-22 DIAGNOSIS — I5031 Acute diastolic (congestive) heart failure: Secondary | ICD-10-CM | POA: Insufficient documentation

## 2015-01-22 LAB — GLUCOSE, CAPILLARY
GLUCOSE-CAPILLARY: 111 mg/dL — AB (ref 70–99)
GLUCOSE-CAPILLARY: 165 mg/dL — AB (ref 70–99)
GLUCOSE-CAPILLARY: 111 mg/dL — AB (ref 70–99)
Glucose-Capillary: 133 mg/dL — ABNORMAL HIGH (ref 70–99)

## 2015-01-22 LAB — TROPONIN I
Troponin I: <0.03 ng/mL (ref <0.031)
Troponin I: <0.03 ng/mL (ref <0.031)

## 2015-01-22 MED ORDER — DILTIAZEM HCL ER COATED BEADS 120 MG PO CP24
120.0000 mg | ORAL_CAPSULE | Freq: Every day | ORAL | Status: DC
  Administered 2015-01-23 – 2015-01-24 (×2): 120 mg via ORAL
  Filled 2015-01-22 (×2): qty 1

## 2015-01-22 MED ORDER — AMIODARONE LOAD VIA INFUSION
150.0000 mg | Freq: Once | INTRAVENOUS | Status: AC
  Administered 2015-01-22: 150 mg via INTRAVENOUS
  Filled 2015-01-22: qty 83.34

## 2015-01-22 MED ORDER — MORPHINE SULFATE 2 MG/ML IJ SOLN
1.0000 mg | Freq: Once | INTRAMUSCULAR | Status: AC
  Administered 2015-01-22: 1 mg via INTRAVENOUS
  Filled 2015-01-22: qty 1

## 2015-01-22 MED ORDER — AMIODARONE HCL IN DEXTROSE 360-4.14 MG/200ML-% IV SOLN
60.0000 mg/h | INTRAVENOUS | Status: AC
  Administered 2015-01-22: 60 mg/h via INTRAVENOUS
  Filled 2015-01-22 (×3): qty 200

## 2015-01-22 MED ORDER — AMIODARONE HCL IN DEXTROSE 360-4.14 MG/200ML-% IV SOLN
30.0000 mg/h | INTRAVENOUS | Status: AC
  Administered 2015-01-22 – 2015-01-23 (×3): 30 mg/h via INTRAVENOUS
  Filled 2015-01-22 (×8): qty 200

## 2015-01-22 NOTE — Progress Notes (Signed)
Patient's EKG is different from last EKG, paged on-call MD to make them aware. Will follow any new orders given.

## 2015-01-22 NOTE — Progress Notes (Signed)
Patients HR is going up as high as 159, it is not sustaining at that rate but I will get an EKG and make the on-call MD aware of the results and continue to monitor the patient.

## 2015-01-22 NOTE — Progress Notes (Signed)
PROGRESS NOTE  Isabella Hall [id number] DOB: 25-Jan-1956 DOA: 01/19/2015 PCP: Robert Bellow, MD  Summary: 59 year old woman with history of atrial fibrillation not on anticoagulation presented to the emergency department with shortness of breath, found to have significant tachycardia/rapid ventricular response with associated hypercapnic respiratory failure requiring BiPAP.  Assessment/Plan: 1. Acute hypoxic and hypercapnic respiratory failure with respiratory acidosis. Resolved. Hypoxia has resolved. 2. Atrial fibrillation/flutter with rapid ventricular response, h/o PAF, not previously on anticoagulation. CHADS2 = 4. Converted to sinus rhythm on amiodarone and IV Lopressor. However now has rapid ventricular rates again with flutter. 3. Elevated troponin. Favor demand ischemia. Trended down. Further management defer to cardiology. Asymptomatic. 4. Elevated BNP favor related to rapid rate rather than CHF.  5. Bilateral pleural effusions and pulmonary edema; clinically resolved. 6. CKD stage III, remains stable. 7. Hypothyroidism. TSH normal this admission. . 8. Diabetes mellitus. Remained stable. 9. History of coronary artery disease 10. Obesity   She has mild chest pressure with fast heart rate, doubt ACS. EKG similar to previous showing rate related changes. Discussed with cardiology Dr. [name], plan transfer back to stepdown unit, plan IV amiodarone  Will cycle troponin  BMP in the morning  Code Status: full code DVT prophylaxis: Lovenox Family Communication:  Disposition Plan: home  Murray Hodgkins, MD  [facility]  Pager [phone] If 7PM-7AM, please contact night-coverage at [email or web address], password [email or web address] 01/22/2015, 12:22 PM  LOS: 3 days   Consultants:  Cardiology   Procedures:  2-d echocardiogram  Study Conclusions  - Procedure narrative: Transthoracic echocardiography. Image quality was poor. The study was technically difficult, as a result of  body habitus. - Left ventricle: The cavity size was normal. There was moderate concentric hypertrophy. Systolic function was low normal. The estimated ejection fraction was approximately 50%. Images were inadequate for LV wall motion assessment. Findings consistent with left ventricular diastolic dysfunction. Doppler parameters are consistent with high ventricular filling pressure. - Mitral valve: There was moderate eccentric regurgitation. - Left atrium: The atrium was mildly dilated. Volume/bsa, ES, (1-plane Simpson&'s, A2C): 31.1 ml/m^2.  Antibiotics:    HPI/Subjective: Elevated heart rates beginning last night which were labile and transient. When viewed last night predominantly sinus rhythm/sinus tachycardia. Heart rate up to 150s overnight, recorded as 67 at 541 this morning. Per nursing, patient now complaining of chest pain tachycardia and noted to have hypotension with automatic cuff. Sats normal  She has some left-sided chest pressure and some shortness of breath.  Objective: Filed Vitals:   01/21/15 1542 01/21/15 2208 01/22/15 0541 01/22/15 1212  BP: 119/75 111/84 96/65 89/66   Pulse: 57 154 67 151  Temp: 97.8 F (36.6 C) 98.1 F (36.7 C) 97.8 F (36.6 C) 97.8 F (36.6 C)  TempSrc: Oral Oral Oral Oral  Resp: 20 20 20    Height:      Weight:   125.329 kg (276 lb 4.8 oz)   SpO2: 99% 96% 97% 98%    Intake/Output Summary (Last 24 hours) at 01/22/15 1222 Last data filed at 01/22/15 0859  Gross per 24 hour  Intake      3 ml  Output      0 ml  Net      3 ml     Filed Weights   01/20/15 0500 01/21/15 0500 01/22/15 0541  Weight: 130.5 kg (287 lb 11.2 oz) 128.2 kg (282 lb 10.1 oz) 125.329 kg (276 lb 4.8 oz)    Exam:     Afebrile, tachycardic, manual blood pressure normal 122/70. No  hypoxia. General:  Appears calm and mildly uncomfortable, lying flat. Eyes: Grossly unremarkable ENT: grossly normal hearing, lips  Cardiovascular: Tachycardic, regular,  no murmur, rub or gallop. No large 70 edema. Telemetry: Atrial fibrillation/flutter, ventricular rate 150s Respiratory: CTA bilaterally, no w/r/r. Normal respiratory effort. Psychiatric: grossly normal mood and affect, speech fluent and appropriate Neurologic: grossly non-focal.  Data Reviewed:  Blood sugars stable.  EKG [phone] this morning showed atrial flutter with rapid/variable ventricular response with T-wave inversion consider anterolateral ischemia.  Pertinent data: Admission Labs  Troponins 0.03 >> .13 >> .08 >> 0.06  BNP 688 on admission  ABG on admission 7.26/48/106  TSH 1.323   Creatinine 1.48 >> 1.44. Baseline actually 1.3  Hemoglobin A1c 6.8  Imaging   Chest x-ray suggested CHF.  CT chest no PE. CHF. Bilateral pleural effusions.  Other  EKG atrial fibrillation rapid ventricular response, supraventricular tachycardia on second EKG   Pending data:    Scheduled Meds: . aspirin EC  81 mg Oral Daily  . diltiazem  240 mg Oral Daily  . insulin aspart  0-5 Units Subcutaneous QHS  . insulin aspart  0-9 Units Subcutaneous TID WC  . levothyroxine  150 mcg Oral QAC breakfast  .  morphine injection  1 mg Intravenous Once  . rivaroxaban  20 mg Oral Daily  . sodium chloride  3 mL Intravenous Q12H  . sodium chloride  3 mL Intravenous Q12H   Continuous Infusions:    Principal Problem:   Acute CHF- secondary to AF with RVR Active Problems:   Atrial fibrillation with RVR   Dyspnea   Diabetes type 2, uncontrolled   PAF- last episode 2013   Hypertension, poor control   Hypothyroid- TSH WNL   Obesity-BMI 45   Renal insufficiency-stage 3   Acute respiratory failure with hypoxia   Elevated troponin   CKD (chronic kidney disease), stage III   Acute pulmonary edema   Time spent 25 minutes

## 2015-01-22 NOTE — Progress Notes (Signed)
Pt complaining of chest discomfort. Pt denies any radiation or nausea and vomiting.Pt's vital signs Temp 97.8, Pulse 151, B/P 122/78, Respirations 20. MD notified and made aware. Per MD order 12 lead EKG was ordered and Respiratory notified.

## 2015-01-22 NOTE — Progress Notes (Addendum)
SUBJECTIVE: After being transferred to floor, patient developed rapid atrial flutter. ECG shows precordial T wave inversions with rapid atrial flutter with variable conduction. One ECG showed sinus bradycardia, marked precordial T wave inversions, and prolonged QTc 615 ms. Pt has been having episodic chest discomfort. Currently is feeling well and denies shortness of breath. Family member mentions patient snores considerably with apneic episodes.     Intake/Output Summary (Last 24 hours) at 01/22/15 1304 Last data filed at 01/22/15 0859  Gross per 24 hour  Intake      3 ml  Output      0 ml  Net      3 ml    Current Facility-Administered Medications  Medication Dose Route Frequency Provider Last Rate Last Dose  . 0.9 %  sodium chloride infusion  250 mL Intravenous PRN Jani Gravel, MD      . aspirin EC tablet 81 mg  81 mg Oral Daily Jani Gravel, MD   81 mg at 01/22/15 0858  . diltiazem (CARDIZEM CD) 24 hr capsule 240 mg  240 mg Oral Daily Herminio Commons, MD   240 mg at 01/22/15 0655  . insulin aspart (novoLOG) injection 0-5 Units  0-5 Units Subcutaneous QHS Jani Gravel, MD   2 Units at 01/20/15 0138  . insulin aspart (novoLOG) injection 0-9 Units  0-9 Units Subcutaneous TID WC Jani Gravel, MD   1 Units at 01/22/15 1238  . levothyroxine (SYNTHROID, LEVOTHROID) tablet 150 mcg  150 mcg Oral QAC breakfast Jani Gravel, MD   150 mcg at 01/22/15 0858  . rivaroxaban (XARELTO) tablet 20 mg  20 mg Oral Daily Lendon Colonel, NP   20 mg at 01/22/15 0858  . sodium chloride (OCEAN) 0.65 % nasal spray 1 spray  1 spray Each Nare PRN Jani Gravel, MD      . sodium chloride 0.9 % injection 3 mL  3 mL Intravenous Q12H Jani Gravel, MD   3 mL at 01/22/15 0859  . sodium chloride 0.9 % injection 3 mL  3 mL Intravenous Q12H Jani Gravel, MD   3 mL at 01/22/15 0859  . sodium chloride 0.9 % injection 3 mL  3 mL Intravenous PRN Jani Gravel, MD        Filed Vitals:   01/21/15 2208 01/22/15 0541 01/22/15 1212  01/22/15 1234  BP: 111/84 96/65 89/66  122/78  Pulse: 154 67 151   Temp: 98.1 F (36.7 C) 97.8 F (36.6 C) 97.8 F (36.6 C)   TempSrc: Oral Oral Oral   Resp: 20 20 20    Height:      Weight:  276 lb 4.8 oz (125.329 kg)    SpO2: 96% 97% 98%     PHYSICAL EXAM General: NAD HEENT: Normal. Neck: No JVD, no thyromegaly.  Lungs: Clear to auscultation bilaterally with normal respiratory effort. CV: Tachycardic, irregular rhythm, normal S1/S2, no S3, no murmur.  No pretibial edema.  No carotid bruit.  Normal pedal pulses.  Abdomen: Soft, nontender, obese, no distention.  Neurologic: Alert and oriented x 3.  Psych: Normal affect. Musculoskeletal: Normal range of motion. No gross deformities. Extremities: No clubbing or cyanosis.   TELEMETRY: Reviewed telemetry pt in rapid atrial flutter and fibrillation with variable conduction.  LABS: Basic Metabolic Panel:  Recent Labs  01/20/15 0709 01/21/15 0447  NA 136 139  K 4.0 3.7  CL 102 106  CO2 24 28  GLUCOSE 262* 140*  BUN 28* 39*  CREATININE 1.42*  1.44*  CALCIUM 8.9 8.3*   Liver Function Tests: No results for input(s): AST, ALT, ALKPHOS, BILITOT, PROT, ALBUMIN in the last 72 hours. No results for input(s): LIPASE, AMYLASE in the last 72 hours. CBC:  Recent Labs  01/19/15 2112 01/21/15 0447  WBC 13.4* 10.0  NEUTROABS 7.2  --   HGB 12.3 10.5*  HCT 40.1 34.0*  MCV 83.0 81.1  PLT 237 178   Cardiac Enzymes:  Recent Labs  01/20/15 0122 01/20/15 0709 01/20/15 1342  TROPONINI 0.13* 0.08* 0.06*   BNP: Invalid input(s): POCBNP D-Dimer:  Recent Labs  01/19/15 2112  DDIMER 3.13*   Hemoglobin A1C:  Recent Labs  01/20/15 0100  HGBA1C 6.8*   Fasting Lipid Panel:  Recent Labs  01/21/15 0852  CHOL 147  HDL 45  LDLCALC 90  TRIG 61  CHOLHDL 3.3   Thyroid Function Tests:  Recent Labs  01/20/15 0122  TSH 1.323   Anemia Panel: No results for input(s): VITAMINB12, FOLATE, FERRITIN, TIBC, IRON,  RETICCTPCT in the last 72 hours.  RADIOLOGY: Ct Angio Chest Pe W/cm &/or Wo Cm  01/19/2015   CLINICAL DATA:  Respiratory distress. Shortness of breath and diaphoresis for 1 day.  EXAM: CT ANGIOGRAPHY CHEST WITH CONTRAST  TECHNIQUE: Multidetector CT imaging of the chest was performed using the standard protocol during bolus administration of intravenous contrast. Multiplanar CT image reconstructions and MIPs were obtained to evaluate the vascular anatomy.  CONTRAST:  3mL OMNIPAQUE IOHEXOL 350 MG/ML SOLN  COMPARISON:  Radiograph earlier this day.  FINDINGS: There are no filling defects within the pulmonary arteries to suggest pulmonary embolus.  The thoracic aorta is normal in caliber without evidence of dissection. Heart is enlarged. Mild reflux of contrast into the IVC. There are bilateral pleural effusions with adjacent atelectasis in the lower lobes. Mild diffuse ground-glass opacities suggestive of pulmonary edema. There is central bronchial thickening. No focal consolidation to suggest pneumonia. No pulmonary mass. Scattered linear atelectasis throughout both lung. There are small superior mediastinal lymph nodes are likely reactive. No hilar adenopathy. No axillary adenopathy. There is no pericardial effusion.  No definite acute abnormality in the included upper abdomen. There are no acute or suspicious osseous abnormalities.  Review of the MIP images confirms the above findings.  IMPRESSION: 1. No pulmonary embolus. 2. Findings consistent with CHF with cardiomegaly, bilateral pleural effusions and diffuse ground-glass opacities likely pulmonary edema.   Electronically Signed   By: Jeb Levering M.D.   On: 01/19/2015 23:36   Dg Chest Port 1 View  01/19/2015   CLINICAL DATA:  Dyspnea and respiratory distress. Symptoms for 1 day. Initial encounter.  EXAM: PORTABLE CHEST - 1 VIEW  COMPARISON:  11/09/2012  FINDINGS: The heart is enlarged. There is perivascular haziness concerning for pulmonary edema and  mild vascular congestion. Ill-defined hazy opacity at the lung bases, may reflect small pleural effusions versus soft tissue attenuation from body habitus. No pneumothorax. No confluent airspace disease to suggest pneumonia. No acute osseous abnormalities are seen.  IMPRESSION: Cardiomegaly and probable pulmonary edema, findings suggest CHF. Questionable pleural effusions versus soft tissue attenuation from body habitus.   Electronically Signed   By: Jeb Levering M.D.   On: 01/19/2015 21:39      ASSESSMENT AND PLAN: 1. Rapid atrial flutter with variable conduction with atrial fibrillation: I have spoken to Dr. Sarajane Jews. Will plan to transfer to ICU and rebolus with IV amiodarone and begin continuous infusion. I would consider treatment with oral amiodarone for at least  a month with thyroid function monitoring. Will reduce Cardizem CD to 120 mg daily with reduced dose to begin on 2/6. Continue Xarelto. Formerly undiagnosed sleep apnea appears to be contributing. 2. Chest pain with troponin elevation: Consistent with demand ischemia. Given multiple cardiovascular risk factors, will cycle troponins again and plan for outpatient stress testing. 3. Acute diastolic CHF: Resolved. Will need to continue to monitor for this given recurrence of tachyarrhythmia. LV systolic function is low normal, EF 50%. 4. Sleep apnea: Will need outpatient sleep study. 5. Hypothyroidism: Stable on current dose of Synthroid. 6. CKD stage 3: stable. 7. Type 2 diabetes: Continue insulin for now. 8. Essential HTN: Stable. Continue to monitor. 9. Mitral regurgitation: Moderate on 2/4. Will monitor.  Kate Sable, M.D., F.A.C.C.

## 2015-01-22 NOTE — Progress Notes (Signed)
Pt stated pain is 8 on (0-10) scale. Per MD order Morphine 1mg  administered to patient. MD assessed patient and no further instructions at this time.

## 2015-01-22 NOTE — Progress Notes (Signed)
Pt transferred to ICU. Report given to Nedine and patient transported by RN.

## 2015-01-23 DIAGNOSIS — I4891 Unspecified atrial fibrillation: Secondary | ICD-10-CM

## 2015-01-23 LAB — GLUCOSE, CAPILLARY
GLUCOSE-CAPILLARY: 114 mg/dL — AB (ref 70–99)
GLUCOSE-CAPILLARY: 125 mg/dL — AB (ref 70–99)
GLUCOSE-CAPILLARY: 94 mg/dL (ref 70–99)
Glucose-Capillary: 103 mg/dL — ABNORMAL HIGH (ref 70–99)

## 2015-01-23 LAB — TROPONIN I: Troponin I: <0.03 ng/mL (ref <0.031)

## 2015-01-23 MED ORDER — DILTIAZEM HCL 25 MG/5ML IV SOLN
10.0000 mg | Freq: Once | INTRAVENOUS | Status: AC
  Administered 2015-01-23: 10 mg via INTRAVENOUS
  Filled 2015-01-23: qty 5

## 2015-01-23 MED ORDER — AMIODARONE HCL 200 MG PO TABS
200.0000 mg | ORAL_TABLET | Freq: Two times a day (BID) | ORAL | Status: DC
  Administered 2015-01-23: 200 mg via ORAL
  Filled 2015-01-23: qty 1

## 2015-01-23 NOTE — Progress Notes (Signed)
Isabella NOTE  Isabella Hall [id number] DOB: [birth year] DOA: 01/19/2015 PCP: Robert Bellow, MD  Summary: 59 year old woman with history of atrial fibrillation not on anticoagulation presented to the emergency department with shortness of breath, found to have significant tachycardia/rapid ventricular response with associated hypercapnic respiratory failure requiring BiPAP.  Assessment/Plan: 1. Rapid atrial flutter with variable conduction, atrial fibrillation. Now back in sinus rhythm on amiodarone infusion. Continue Cardizem. Continue Xarelto. 2. Chest pain secondary to rapid rate. Resolved. Initial troponins on admission suggested demand ischemia. Repeat troponins last 24 hours negative. Plan for outpatient stress testing and follow-up with cardiology. 3. Acute diastolic heart failure resolved. Secondary to tachycardia arrhythmia. LV systolic function low normal. 4. Hypothyroidism. TSH within normal limits. Continue Synthroid. 5. Chronic kidney disease stage III. Stable. 6. Diabetes mellitus type 2. 7. Essential hypertension. 8. Moderate mitral regurgitation. Follow-up cardiology. 9. Suspected sleep apnea. Suggest outpatient sleep study.   Overall much improved. Remains in sinus rhythm.  Plan to change to oral amiodarone later today  Likely discharge 2/7 if remains in sinus rhythm on oral amiodarone.  Code Status: full code DVT prophylaxis: Lovenox Family Communication:  Disposition Plan: home  Murray Hodgkins, MD  [facility]  Pager [phone] If 7PM-7AM, please contact night-coverage at [email or web address], password [facility] 01/23/2015, 8:55 AM  LOS: 4 days   Consultants:  Cardiology   Procedures:  2-d echocardiogram  Study Conclusions  - Procedure narrative: Transthoracic echocardiography. Image quality was poor. The study was technically difficult, as a result of body habitus. - Left ventricle: The cavity size was normal. There was moderate concentric  hypertrophy. Systolic function was low normal. The estimated ejection fraction was approximately 50%. Images were inadequate for LV wall motion assessment. Findings consistent with left ventricular diastolic dysfunction. Doppler parameters are consistent with high ventricular filling pressure. - Mitral valve: There was moderate eccentric regurgitation. - Left atrium: The atrium was mildly dilated. Volume/bsa, ES, (1-plane Simpson&'s, A2C): 31.1 ml/m^2.  Antibiotics:    HPI/Subjective: Discussed with cardiology yesterday, patient was bolused with IV amiodarone and then placed on continuous infusion with plans for oral amiodarone at least one month.  Converted to sinus rhythm.  Feeling well. No chest pain or shortness of breath.  Objective: Filed Vitals:   01/23/15 0400 01/23/15 0500 01/23/15 0600 01/23/15 0800  BP:  139/81 142/83   Pulse: 60 64 57   Temp:  97.6 F (36.4 C)  97.3 F (36.3 C)  TempSrc:  Oral  Oral  Resp: 15 14 13    Height:      Weight:  128.3 kg (282 lb 13.6 oz)    SpO2: 97% 96% 96%     Intake/Output Summary (Last 24 hours) at 01/23/15 0855 Last data filed at 01/23/15 0500  Gross per 24 hour  Intake    410 ml  Output      0 ml  Net    410 ml     Filed Weights   01/22/15 0541 01/22/15 1530 01/23/15 0500  Weight: 125.329 kg (276 lb 4.8 oz) 127 kg (279 lb 15.8 oz) 128.3 kg (282 lb 13.6 oz)    Exam:     Afebrile, vitals stable. Heart rate controlled. No hypoxia. General:  Appears comfortable, calm. Cardiovascular: Regular rate and rhythm, no murmur, rub or gallop. No lower extremity edema. Telemetry: Sinus rhythm Respiratory: Clear to auscultation bilaterally, no wheezes, rales or rhonchi. Normal respiratory effort. Psychiatric: grossly normal mood and affect, speech fluent and appropriate  Data Reviewed:  Blood sugars remain stable.  Troponins negative.  Pertinent data: Admission Labs  Troponins 0.03 >> .13 >> .08 >> 0.06;  repeat troponins negative.  BNP 688 on admission  ABG on admission 7.26/48/106  TSH 1.323   Creatinine 1.48 >> 1.44. Baseline actually 1.3  Hemoglobin A1c 6.8  Imaging   Chest x-ray suggested CHF.  CT chest no PE. CHF. Bilateral pleural effusions.  Other  EKG atrial fibrillation rapid ventricular response, supraventricular tachycardia on second EKG   Pending data:    Scheduled Meds: . aspirin EC  81 mg Oral Daily  . diltiazem  120 mg Oral Daily  . insulin aspart  0-5 Units Subcutaneous QHS  . insulin aspart  0-9 Units Subcutaneous TID WC  . levothyroxine  150 mcg Oral QAC breakfast  . rivaroxaban  20 mg Oral Daily  . sodium chloride  3 mL Intravenous Q12H  . sodium chloride  3 mL Intravenous Q12H   Continuous Infusions: . amiodarone 30 mg/hr (01/23/15 0617)    Principal Problem:   Acute CHF- secondary to AF with RVR Active Problems:   Atrial fibrillation with RVR   Dyspnea   Diabetes type 2, uncontrolled   PAF- last episode 2013   Hypertension, poor control   Hypothyroid- TSH WNL   Obesity-BMI 45   Renal insufficiency-stage 3   Acute respiratory failure with hypoxia   Elevated troponin   CKD (chronic kidney disease), stage III   Acute pulmonary edema   Acute diastolic heart failure   Pain in the chest   Demand ischemia   Moderate mitral regurgitation   Time spent 20 minutes

## 2015-01-23 NOTE — Progress Notes (Signed)
Patient refusing to wear blood pressure cuff continuously. Will spot check BP as needed

## 2015-01-24 LAB — GLUCOSE, CAPILLARY
GLUCOSE-CAPILLARY: 114 mg/dL — AB (ref 70–99)
GLUCOSE-CAPILLARY: 107 mg/dL — AB (ref 70–99)
Glucose-Capillary: 120 mg/dL — ABNORMAL HIGH (ref 70–99)
Glucose-Capillary: 119 mg/dL — ABNORMAL HIGH (ref 70–99)

## 2015-01-24 MED ORDER — AMIODARONE IV BOLUS ONLY 150 MG/100ML
150.0000 mg | Freq: Once | INTRAVENOUS | Status: AC
  Administered 2015-01-24: 150 mg via INTRAVENOUS
  Filled 2015-01-24: qty 100

## 2015-01-24 MED ORDER — AMIODARONE HCL IN DEXTROSE 360-4.14 MG/200ML-% IV SOLN: 30.0000 mg/h | INTRAVENOUS | Status: DC

## 2015-01-24 MED ORDER — AMIODARONE IV BOLUS ONLY 150 MG/100ML: 150.0000 mg | Freq: Once | INTRAVENOUS | Status: DC

## 2015-01-24 MED ORDER — AMIODARONE IV BOLUS ONLY 150 MG/100ML
150.0000 mg | Freq: Once | INTRAVENOUS | Status: AC
  Administered 2015-01-24: 150 mg via INTRAVENOUS

## 2015-01-24 MED ORDER — AMIODARONE HCL IN DEXTROSE 360-4.14 MG/200ML-% IV SOLN
30.0000 mg/h | INTRAVENOUS | Status: DC
  Administered 2015-01-24: 30 mg/h via INTRAVENOUS
  Administered 2015-01-24: 60 mg/h via INTRAVENOUS
  Administered 2015-01-24: 30 mg/h via INTRAVENOUS
  Filled 2015-01-24 (×3): qty 200

## 2015-01-24 MED ORDER — DILTIAZEM HCL ER COATED BEADS 180 MG PO CP24
180.0000 mg | ORAL_CAPSULE | Freq: Every day | ORAL | Status: DC
  Administered 2015-01-25: 180 mg via ORAL
  Filled 2015-01-24: qty 1

## 2015-01-24 NOTE — Progress Notes (Signed)
Converted from afib to nsr.  MD rounding at this time and aware.  Will continue to monitor.  Schonewitz, Eulis Canner 01/24/2015

## 2015-01-24 NOTE — Progress Notes (Signed)
Isabella Hall was notified about pt's increased HR 120-150. Amiodarone drip was stopped at 2030 pm per MD order and 15 min later pt's heart rhythm changed from NS to Afib/Aflutter. ECG was done to verify the rhythm. Amiodarone PO was given a little earlier. Continue to monitor the pt.

## 2015-01-24 NOTE — Progress Notes (Signed)
Dr. Shanon Brow is notified about pt's  Increased HR 120-140s. Pt is currently on Amiodarone drip, pt denies any pain. Continue to monitor the pt.

## 2015-01-24 NOTE — Progress Notes (Signed)
Pt' s HR is still high 120-160s. Order received to restart  Amiodarone drip per M. Lynch. Continue to monitor the pt.

## 2015-01-24 NOTE — Progress Notes (Signed)
PROGRESS NOTE  Isabella Hall [id number] DOB: [birth year] DOA: 01/19/2015 PCP: Robert Bellow, MD  Summary: 59 year old woman with history of atrial fibrillation not on anticoagulation presented to the emergency department with shortness of breath, found to have significant tachycardia/rapid ventricular response with associated hypercapnic respiratory failure requiring BiPAP.  Assessment/Plan: 1. Rapid atrial flutter with variable conduction, atrial fibrillation. In and out of sinus rhythm with recurrent atrial flutter. Currently atrial flutter. Continue amiodarone infusion. Continue Cardizem. Continue Xarelto. 2. Chest pain secondary to rapid rate. Resolved. Initial troponins on admission suggested demand ischemia. Repeat troponins last 24 hours negative. Plan for outpatient stress testing and follow-up with cardiology.  3. Acute diastolic heart failure resolved. Secondary to tachycardia arrhythmia. LV systolic function low normal. 4. Hypothyroidism. TSH within normal limits. Continue Synthroid. 5. Chronic kidney disease stage III.  6. Diabetes mellitus type 2. Remains stable. 7. Essential hypertension. Stable. 8. Moderate mitral regurgitation. Follow-up with cardiology. 9. Suspected sleep apnea. Suggest outpatient sleep study.   She is asymptomatic at this point but continues to have atrial flutter with rapid rates. Plan to continue IV amiodarone infusion, increase Cardizem as blood pressure concurrently tolerated. Further cardiology recommendations 2/8.  Code Status: full code DVT prophylaxis: Lovenox Family Communication:  Disposition Plan: home  Murray Hodgkins, MD  [facility]  Pager [phone] If 7PM-7AM, please contact night-coverage at [email or web address], password [facility] 01/24/2015, 8:13 AM  LOS: 5 days   Consultants:  Cardiology   Procedures:  2-d echocardiogram  Study Conclusions  - Procedure narrative: Transthoracic echocardiography. Image quality was poor.  The study was technically difficult, as a result of body habitus. - Left ventricle: The cavity size was normal. There was moderate concentric hypertrophy. Systolic function was low normal. The estimated ejection fraction was approximately 50%. Images were inadequate for LV wall motion assessment. Findings consistent with left ventricular diastolic dysfunction. Doppler parameters are consistent with high ventricular filling pressure. - Mitral valve: There was moderate eccentric regurgitation. - Left atrium: The atrium was mildly dilated. Volume/bsa, ES, (1-plane Simpson&'s, A2C): 31.1 ml/m^2.  Antibiotics:    HPI/Subjective: Patient was switched oral amiodarone after 24 hours of amiodarone infusion and developed recurrent atrial flutter. She was restarted on IV amiodarone.  Overall feels okay today. No specific chest pain or shortness of breath.  Objective: Filed Vitals:   01/24/15 0300 01/24/15 0400 01/24/15 0500 01/24/15 0600  BP:  93/79 108/81 130/89  Pulse:  100 43 70  Temp:  98 F (36.7 C)    TempSrc:  Oral    Resp: 28 17 18 16   Height:      Weight:   124.4 kg (274 lb 4 oz)   SpO2:  93% 97% 97%   No intake or output data in the 24 hours ending 01/24/15 0813   Filed Weights   01/22/15 1530 01/23/15 0500 01/24/15 0500  Weight: 127 kg (279 lb 15.8 oz) 128.3 kg (282 lb 13.6 oz) 124.4 kg (274 lb 4 oz)    Exam:     Afebrile, normotensive,in and out of normal sinus rhythm/atrial fibrillation General:  Appears comfortable, calm. Cardiovascular: tachycardic, no murmur, rub or gallop. Telemetry: Sinus rhythm, no arrhythmias alternating with atrial flutter with rapid ventricular response and variable block Respiratory: Clear to auscultation bilaterally, no wheezes, rales or rhonchi. Normal respiratory effort. Psychiatric: grossly normal mood and affect, speech fluent and appropriate  Data Reviewed:  Blood sugars remain stable.   Pertinent  data: Admission Labs  Troponins 0.03 >> .13 >> .08 >> 0.06; repeat  troponins negative.  BNP 688 on admission  ABG on admission 7.26/48/106  TSH 1.323   Creatinine 1.48 >> 1.44. Baseline actually 1.3  Hemoglobin A1c 6.8  Imaging   Chest x-ray suggested CHF.  CT chest no PE. CHF. Bilateral pleural effusions.  Other  EKG atrial fibrillation rapid ventricular response, supraventricular tachycardia on second EKG   Pending data:    Scheduled Meds: . amiodarone  200 mg Oral BID  . aspirin EC  81 mg Oral Daily  . diltiazem  120 mg Oral Daily  . insulin aspart  0-5 Units Subcutaneous QHS  . insulin aspart  0-9 Units Subcutaneous TID WC  . levothyroxine  150 mcg Oral QAC breakfast  . rivaroxaban  20 mg Oral Daily  . sodium chloride  3 mL Intravenous Q12H  . sodium chloride  3 mL Intravenous Q12H   Continuous Infusions: . amiodarone 30 mg/hr (01/24/15 0603)    Principal Problem:   Acute CHF- secondary to AF with RVR Active Problems:   Atrial fibrillation with RVR   Dyspnea   Diabetes type 2, uncontrolled   PAF- last episode 2013   Hypertension, poor control   Hypothyroid- TSH WNL   Obesity-BMI 45   Renal insufficiency-stage 3   Acute respiratory failure with hypoxia   Elevated troponin   CKD (chronic kidney disease), stage III   Acute pulmonary edema   Acute diastolic heart failure   Pain in the chest   Demand ischemia   Moderate mitral regurgitation   Time spent 25 minutes

## 2015-01-24 NOTE — Progress Notes (Addendum)
Shift Event RN paged secondary to pt HR in 140-150s, despite Amiodarone infusion. Will order Amiodarone 150 bolus (confirmed w/ Cards fellow).   Update: Tachycardia much improved with HR in 100s-130s after Amio bolus. Will continue to monitor  Lacy Duverney [facility] [phone]

## 2015-01-24 NOTE — Progress Notes (Signed)
Pt's HR continues to stay in t120s -150s range. Isabella Hall was notified about it and order was received to give 10mg  Cardizem bolus. Continue to monitor the pt.

## 2015-01-25 ENCOUNTER — Inpatient Hospital Stay (HOSPITAL_COMMUNITY): Payer: 59 | Admitting: Anesthesiology

## 2015-01-25 ENCOUNTER — Encounter (HOSPITAL_COMMUNITY)
Admission: EM | Disposition: A | Discharge status: Home / Self Care | Payer: Self-pay | Source: Home / Self Care | Attending: Family Medicine

## 2015-01-25 ENCOUNTER — Encounter (HOSPITAL_COMMUNITY): Payer: Self-pay | Admitting: Cardiology

## 2015-01-25 DIAGNOSIS — I480 Paroxysmal atrial fibrillation: Secondary | ICD-10-CM

## 2015-01-25 DIAGNOSIS — I4892 Unspecified atrial flutter: Secondary | ICD-10-CM

## 2015-01-25 DIAGNOSIS — I5031 Acute diastolic (congestive) heart failure: Secondary | ICD-10-CM

## 2015-01-25 HISTORY — PX: TEE WITHOUT CARDIOVERSION: SHX5443

## 2015-01-25 HISTORY — PX: CARDIOVERSION: SHX1299

## 2015-01-25 LAB — GLUCOSE, CAPILLARY: GLUCOSE-CAPILLARY: 139 mg/dL — AB (ref 70–99)

## 2015-01-25 SURGERY — ECHOCARDIOGRAM, TRANSESOPHAGEAL
Anesthesia: Monitor Anesthesia Care

## 2015-01-25 MED ORDER — MIDAZOLAM HCL 2 MG/2ML IJ SOLN
INTRAMUSCULAR | Status: AC
  Filled 2015-01-25: qty 2

## 2015-01-25 MED ORDER — PROPOFOL 10 MG/ML IV BOLUS
INTRAVENOUS | Status: AC
  Filled 2015-01-25: qty 20

## 2015-01-25 MED ORDER — PROPOFOL INFUSION 10 MG/ML OPTIME
INTRAVENOUS | Status: DC | PRN
  Administered 2015-01-25: 125 ug/kg/min via INTRAVENOUS

## 2015-01-25 MED ORDER — ONDANSETRON HCL 4 MG/2ML IJ SOLN
INTRAMUSCULAR | Status: AC
Start: 2015-01-25 — End: 2015-01-25
  Filled 2015-01-25: qty 2

## 2015-01-25 MED ORDER — PHENOL 1.4 % MT LIQD
1.0000 | OROMUCOSAL | Status: DC | PRN
  Administered 2015-01-25: 1 via OROMUCOSAL
  Filled 2015-01-25: qty 177

## 2015-01-25 MED ORDER — LACTATED RINGERS IV SOLN: INTRAVENOUS | Status: DC

## 2015-01-25 MED ORDER — ONDANSETRON HCL 4 MG/2ML IJ SOLN: 4.0000 mg | Freq: Once | INTRAMUSCULAR | Status: AC | PRN

## 2015-01-25 MED ORDER — ONDANSETRON HCL 4 MG/2ML IJ SOLN
INTRAMUSCULAR | Status: DC | PRN
  Administered 2015-01-25: 4 mg via INTRAVENOUS

## 2015-01-25 MED ORDER — LACTATED RINGERS IV SOLN
INTRAVENOUS | Status: DC | PRN
  Administered 2015-01-25: 10:00:00 via INTRAVENOUS

## 2015-01-25 MED ORDER — FENTANYL CITRATE 0.05 MG/ML IJ SOLN: 25.0000 ug | INTRAMUSCULAR | Status: DC | PRN

## 2015-01-25 MED ORDER — ONDANSETRON HCL 4 MG/2ML IJ SOLN: 4.0000 mg | Freq: Once | INTRAMUSCULAR | Status: DC

## 2015-01-25 MED ORDER — LIDOCAINE HCL (CARDIAC) 10 MG/ML IV SOLN
INTRAVENOUS | Status: DC | PRN
  Administered 2015-01-25: 50 mg via INTRAVENOUS

## 2015-01-25 MED ORDER — FENTANYL CITRATE 0.05 MG/ML IJ SOLN
25.0000 ug | INTRAMUSCULAR | Status: AC
Start: 2015-01-25 — End: 2015-01-25

## 2015-01-25 MED ORDER — FENTANYL CITRATE 0.05 MG/ML IJ SOLN
INTRAMUSCULAR | Status: AC
  Filled 2015-01-25: qty 2

## 2015-01-25 MED ORDER — SODIUM CHLORIDE BACTERIOSTATIC 0.9 % IJ SOLN
INTRAMUSCULAR | Status: AC
  Filled 2015-01-25: qty 10

## 2015-01-25 MED ORDER — MIDAZOLAM HCL 5 MG/5ML IJ SOLN
INTRAMUSCULAR | Status: DC | PRN
  Administered 2015-01-25 (×2): 1 mg via INTRAVENOUS

## 2015-01-25 MED ORDER — FENTANYL CITRATE 0.05 MG/ML IJ SOLN
INTRAMUSCULAR | Status: DC | PRN
  Administered 2015-01-25 (×2): 25 ug via INTRAVENOUS

## 2015-01-25 MED ORDER — MIDAZOLAM HCL 2 MG/2ML IJ SOLN: 1.0000 mg | INTRAMUSCULAR | Status: DC | PRN

## 2015-01-25 NOTE — Progress Notes (Signed)
Amiodarone drip complete.  Dc'd per previous electronic order from Cardiology.  Schonewitz, Eulis Canner 01/25/2015

## 2015-01-25 NOTE — Progress Notes (Signed)
Primary cardiologist: Dr. Kate Sable  Seen for followup: Atrial flutter, diastolic heart failure  Subjective:    Palpitations as before. No chest pain.  Objective:   Temp:  [97.6 F (36.4 C)-98.6 F (37 C)] 97.7 F (36.5 C) (02/08 0730) Pulse Rate:  [37-124] 37 (02/08 0803) Resp:  [14-24] 22 (02/08 0803) BP: (131-140)/(82-112) 140/112 mmHg (02/08 0803) SpO2:  [91 %-100 %] 98 % (02/08 0803) Weight:  [271 lb 9.7 oz (123.2 kg)] 271 lb 9.7 oz (123.2 kg) (02/08 0500) Last BM Date: 01/21/15  Filed Weights   01/23/15 0500 01/24/15 0500 01/25/15 0500  Weight: 282 lb 13.6 oz (128.3 kg) 274 lb 4 oz (124.4 kg) 271 lb 9.7 oz (123.2 kg)    Intake/Output Summary (Last 24 hours) at 01/25/15 0842 Last data filed at 01/25/15 0500  Gross per 24 hour  Intake 1414.72 ml  Output      0 ml  Net 1414.72 ml    Telemetry: Atrial flutter with rapid ventricular response.  Exam:  General: No distress.  Lungs: Nonlabored, decreased breath sounds.  Cardiac: Irregularly irregular, no S3 gallop.  Abdomen: NABS.  Extremities: No pitting edema.  Lab Results:  Basic Metabolic Panel:  Recent Labs Lab 01/19/15 2112 01/20/15 0709 01/21/15 0447  NA 136 136 139  K 4.0 4.0 3.7  CL 104 102 106  CO2 23 24 28   GLUCOSE 277* 262* 140*  BUN 25* 28* 39*  CREATININE 1.48* 1.42* 1.44*  CALCIUM 8.7 8.9 8.3*    CBC:  Recent Labs Lab 01/19/15 2112 01/21/15 0447  WBC 13.4* 10.0  HGB 12.3 10.5*  HCT 40.1 34.0*  MCV 83.0 81.1  PLT 237 178    Cardiac Enzymes:  Recent Labs Lab 01/22/15 1312 01/22/15 1919 01/23/15 0047  TROPONINI <0.03 <0.03 <0.03    Coagulation:  Recent Labs Lab 01/19/15 2112  INR 1.15    ECG: Atrial flutter with rapid ventricular response.  Echocardiogram 01/21/15: Study Conclusions  - Procedure narrative: Transthoracic echocardiography. Image quality was poor. The study was technically difficult, as a result of body habitus. - Left  ventricle: The cavity size was normal. There was moderate concentric hypertrophy. Systolic function was low normal. The estimated ejection fraction was approximately 50%. Images were inadequate for LV wall motion assessment. Findings consistent with left ventricular diastolic dysfunction. Doppler parameters are consistent with high ventricular filling pressure. - Mitral valve: There was moderate eccentric regurgitation. - Left atrium: The atrium was mildly dilated. Volume/bsa, ES, (1-plane Simpson&'s, A2C): 31.1 ml/m^2.   Medications:   Scheduled Medications: . aspirin EC  81 mg Oral Daily  . diltiazem  180 mg Oral Daily  . insulin aspart  0-5 Units Subcutaneous QHS  . insulin aspart  0-9 Units Subcutaneous TID WC  . levothyroxine  150 mcg Oral QAC breakfast  . rivaroxaban  20 mg Oral Daily  . sodium chloride  3 mL Intravenous Q12H  . sodium chloride  3 mL Intravenous Q12H     Infusions: . amiodarone 30 mg/hr (01/25/15 0500)     PRN Medications:  sodium chloride, sodium chloride, sodium chloride   Assessment:   1. Persistent atrial flutter with rapid ventricular response. CHADSVASC score is 4. She is on Xarelto which was initiated on February 4, continues on IV amiodarone as well as oral Cardizem CD. Heart rate still not well-controlled.2.   2. History of PAF.  3. Diastolic heart failure in the setting of above. LVEF approximately 50% by echocardiogram.  4. Essential hypertension.  5. Possible obstructive sleep apnea.  6. CKD stage 3, creatinine 1.4.  7. Type 2 diabetes mellitus.   Plan/Discussion:    Reviewed records including recent consultation note by Dr. [name]. She remains in atrial flutter, difficult to control heart rates even on IV amiodarone. I discussed with her the possibility of proceeding with a TEE guided cardioversion and she is in agreement. We will work on getting this scheduled today with anesthesia to provide sedation. She is  anticoagulated on Xarelto at present. Currently nothing by mouth.   Isabella Hall, M.D., F.A.C.C.

## 2015-01-25 NOTE — Progress Notes (Addendum)
PROGRESS NOTE  Isabella Hall [id number] DOB: 02-13-1956 DOA: 01/19/2015 PCP: Robert Bellow, MD  Summary: 59 year old woman with history of atrial fibrillation not on anticoagulation presented to the emergency department with shortness of breath, found to have significant tachycardia/rapid ventricular response with associated hypercapnic respiratory failure requiring BiPAP. Respiratory status rapidly improved and has remained stable, her hospitalization has been prolonged by atrial flutter with variable conduction. She converted to sinus rhythm after an IV amiodarone bolus but then reverted to atrial flutter which has continued despite IV amiodarone infusion. Plans are for TEE guided cardioversion today.  Assessment/Plan: 1. Rapid atrial flutter with variable conduction, atrial fibrillation. Continue amiodarone infusion. Continue Cardizem. Continue Xarelto. 2. Chest pain secondary to rapid rate. Resolved. Initial troponins on admission suggested demand ischemia. Repeat troponins negative. Plan for outpatient stress testing and follow-up with cardiology.  3. Acute diastolic heart failure resolved. Secondary to tachycardia arrhythmia. LV systolic function low normal. 4. Hypothyroidism. TSH WNL. Continue Synthroid. 5. Chronic kidney disease stage III.  6. Diabetes mellitus type 2. Stable. 7. Essential hypertension. Stable. 8. Moderate mitral regurgitation. Follow-up with cardiology as an outpatient.  9. Suspected sleep apnea. Outpatient sleep study recommended.   She remains tachycardic with atrial flutter. Plan for TEE-guided cardioversion as per cardiology, she continues on amiodarone infusion, oral Cardizem, Xarelto. Otherwise her chronic medical conditions are stable. Disposition pending cardiology recommendations.  Code Status: full code DVT prophylaxis: Lovenox Family Communication:  Disposition Plan: home  Murray Hodgkins, MD  [facility]  Pager [phone] If 7PM-7AM,  please contact night-coverage at [email or web address], password [facility] 01/25/2015, 8:08 AM  LOS: 6 days   Consultants:  Cardiology   Procedures:  2-d echocardiogram  Study Conclusions  - Procedure narrative: Transthoracic echocardiography. Image quality was poor. The study was technically difficult, as a result of body habitus. - Left ventricle: The cavity size was normal. There was moderate concentric hypertrophy. Systolic function was low normal. The estimated ejection fraction was approximately 50%. Images were inadequate for LV wall motion assessment. Findings consistent with left ventricular diastolic dysfunction. Doppler parameters are consistent with high ventricular filling pressure. - Mitral valve: There was moderate eccentric regurgitation. - Left atrium: The atrium was mildly dilated. Volume/bsa, ES, (1-plane Simpson&'s, A2C): 31.1 ml/m^2.  Antibiotics:    HPI/Subjective: Had rapid rates again overnight after being in and out of sinus rhythm yesterday. Was bolused with IV amiodarone last night.  No CP or SOB but can sense rapid HR. Has some warmth right forearm where IV was.  Objective: Filed Vitals:   01/25/15 0500 01/25/15 0600 01/25/15 0700 01/25/15 0803  BP:    140/112  Pulse: 96 94 88 37  Temp:      TempSrc:      Resp: 14 19 15 22   Height:      Weight: 123.2 kg (271 lb 9.7 oz)     SpO2: 98% 98% 96% 98%    Intake/Output Summary (Last 24 hours) at 01/25/15 0808 Last data filed at 01/25/15 0500  Gross per 24 hour  Intake 1414.72 ml  Output      0 ml  Net 1414.72 ml     Filed Weights   01/23/15 0500 01/24/15 0500 01/25/15 0500  Weight: 128.3 kg (282 lb 13.6 oz) 124.4 kg (274 lb 4 oz) 123.2 kg (271 lb 9.7 oz)    Exam:     Afebrile, variable heart rate, tachycardic, normotensive, no hypoxia. General:  Appears comfortable, calm. Cardiovascular: tachycardic, irregular, no murmur, rub or gallop. No lower  extremity edema. Telemetry: atrial  flutter with HR 140s  Respiratory: Clear to auscultation bilaterally, no wheezes, rales or rhonchi. Normal respiratory effort. Skin: right anterior forearm with 2 cm induration, mild erythema c/w thrombophlebitis; no fluctuance or evidence of infection Psychiatric: grossly normal mood and affect, speech fluent and appropriate Neurologic: grossly non-focal, moves all extremities  Data Reviewed:  Blood sugars stable.  Pertinent data: Admission Labs  Troponins 0.03 >> .13 >> .08 >> 0.06; repeat troponins negative.  BNP 688 on admission  ABG on admission 7.26/48/106  TSH 1.323   Creatinine 1.48 >> 1.44. Baseline actually 1.3  Hemoglobin A1c 6.8  Imaging   Chest x-ray suggested CHF.  CT chest no PE. CHF. Bilateral pleural effusions.  Other  EKG atrial fibrillation rapid ventricular response, supraventricular tachycardia on second EKG   Pending data:    Scheduled Meds: . aspirin EC  81 mg Oral Daily  . diltiazem  180 mg Oral Daily  . insulin aspart  0-5 Units Subcutaneous QHS  . insulin aspart  0-9 Units Subcutaneous TID WC  . levothyroxine  150 mcg Oral QAC breakfast  . rivaroxaban  20 mg Oral Daily  . sodium chloride  3 mL Intravenous Q12H  . sodium chloride  3 mL Intravenous Q12H   Continuous Infusions: . amiodarone 30 mg/hr (01/25/15 0500)    Principal Problem:   Atrial flutter Active Problems:   Diabetes type 2, controlled   Benign essential HTN   Hypothyroid- TSH WNL   Obesity-BMI 45   Elevated troponin   CKD (chronic kidney disease), stage III   Demand ischemia   Moderate mitral regurgitation   Time spent 20 minutes

## 2015-01-25 NOTE — H&P (Signed)
Primary cardiologist: Dr. Kate Sable  Seen for followup: Atrial flutter, diastolic heart failure  Subjective:    Palpitations as before. No chest pain.  Objective:   Temp: [97.6 F (36.4 C)-98.6 F (37 C)] 97.7 F (36.5 C) (02/08 0730) Pulse Rate: [37-124] 37 (02/08 0803) Resp: [14-24] 22 (02/08 0803) BP: (131-140)/(82-112) 140/112 mmHg (02/08 0803) SpO2: [91 %-100 %] 98 % (02/08 0803) Weight: [271 lb 9.7 oz (123.2 kg)] 271 lb 9.7 oz (123.2 kg) (02/08 0500) Last BM Date: 01/21/15  Filed Weights   01/23/15 0500 01/24/15 0500 01/25/15 0500  Weight: 282 lb 13.6 oz (128.3 kg) 274 lb 4 oz (124.4 kg) 271 lb 9.7 oz (123.2 kg)    Intake/Output Summary (Last 24 hours) at 01/25/15 0842 Last data filed at 01/25/15 0500  Gross per 24 hour  Intake 1414.72 ml  Output  0 ml  Net 1414.72 ml    Telemetry: Atrial flutter with rapid ventricular response.  Exam:  General: No distress.  Lungs: Nonlabored, decreased breath sounds.  Cardiac: Irregularly irregular, no S3 gallop.  Abdomen: NABS.  Extremities: No pitting edema.  Lab Results:  Basic Metabolic Panel:  Last Labs      Recent Labs Lab 01/19/15 2112 01/20/15 0709 01/21/15 0447  NA 136 136 139  K 4.0 4.0 3.7  CL 104 102 106  CO2 23 24 28   GLUCOSE 277* 262* 140*  BUN 25* 28* 39*  CREATININE 1.48* 1.42* 1.44*  CALCIUM 8.7 8.9 8.3*      CBC:  Last Labs      Recent Labs Lab 01/19/15 2112 01/21/15 0447  WBC 13.4* 10.0  HGB 12.3 10.5*  HCT 40.1 34.0*  MCV 83.0 81.1  PLT 237 178      Cardiac Enzymes:  Last Labs      Recent Labs Lab 01/22/15 1312 01/22/15 1919 01/23/15 0047  TROPONINI <0.03 <0.03 <0.03      Coagulation:  Last Labs      Recent Labs Lab 01/19/15 2112  INR 1.15      ECG: Atrial flutter with rapid ventricular response.  Echocardiogram 01/21/15: Study  Conclusions  - Procedure narrative: Transthoracic echocardiography. Image quality was poor. The study was technically difficult, as a result of body habitus. - Left ventricle: The cavity size was normal. There was moderate concentric hypertrophy. Systolic function was low normal. The estimated ejection fraction was approximately 50%. Images were inadequate for LV wall motion assessment. Findings consistent with left ventricular diastolic dysfunction. Doppler parameters are consistent with high ventricular filling pressure. - Mitral valve: There was moderate eccentric regurgitation. - Left atrium: The atrium was mildly dilated. Volume/bsa, ES, (1-plane Simpson&'s, A2C): 31.1 ml/m^2.   Medications:   Scheduled Medications:  . aspirin EC 81 mg Oral Daily  . diltiazem 180 mg Oral Daily  . insulin aspart 0-5 Units Subcutaneous QHS  . insulin aspart 0-9 Units Subcutaneous TID WC  . levothyroxine 150 mcg Oral QAC breakfast  . rivaroxaban 20 mg Oral Daily  . sodium chloride 3 mL Intravenous Q12H  . sodium chloride 3 mL Intravenous Q12H     Infusions:  . amiodarone 30 mg/hr (01/25/15 0500)     PRN Medications:  sodium chloride, sodium chloride, sodium chloride   Assessment:   1. Persistent atrial flutter with rapid ventricular response. CHADSVASC score is 4. She is on Xarelto which was initiated on February 4, continues on IV amiodarone as well as oral Cardizem CD. Heart rate still not well-controlled.2.   2.  History of PAF.  3. Diastolic heart failure in the setting of above. LVEF approximately 50% by echocardiogram.  4. Essential hypertension.  5. Possible obstructive sleep apnea.  6. CKD stage 3, creatinine 1.4.  7. Type 2 diabetes mellitus.   Plan/Discussion:    Reviewed records including recent consultation note by Dr. [name]. She remains in atrial flutter, difficult to control  heart rates even on IV amiodarone. I discussed with her the possibility of proceeding with a TEE guided cardioversion and she is in agreement. We will work on getting this scheduled today with anesthesia to provide sedation. She is anticoagulated on Xarelto at present. Currently nothing by mouth.   Satira Sark, M.D., F.A.C.C.             Addendum:  Patient seen in PACU prior to TEE/ DCCV. She is stable in atrial flutter and prepared to proceed, consult obtained.   Satira Sark, M.D., F.A.C.C.

## 2015-01-25 NOTE — CV Procedure (Signed)
TEE guided DCCV:  Indication: Persistent atrial flutter with RVR on medical therapy   Informed consent obtained. Anesthesia service provided sedation throughout in PACU, please refer to their documentation for details. Cetacaine spray to oropharynx, bite block in place. TEE probe easily inserted into esophagus and multiple images obtained. See full TEE report for details - no LA, RA, LAA, or RAA thrombus noted. No PFO by color Doppler interrogation. Subsequent to this biphasic defibrillator used to deliver single, synchronized 100 J shock via anterior and posterior pads with successful restoration of sinus rhythm. Post procedure ECG pending. No immediate complications.  Satira Sark, M.D., F.A.C.C.

## 2015-01-25 NOTE — Anesthesia Postprocedure Evaluation (Signed)
  Anesthesia Post-op Note  Patient: Isabella Hall  Procedure(s) Performed: Procedure(s): TRANSESOPHAGEAL ECHOCARDIOGRAM (TEE) (N/A) CARDIOVERSION (N/A)  Patient Location: PACU  Anesthesia Type:MAC  Level of Consciousness: awake, alert , oriented and patient cooperative  Airway and Oxygen Therapy: Patient Spontanous Breathing and Patient connected to nasal cannula oxygen  Post-op Pain: none  Post-op Assessment: Post-op Vital signs reviewed, Patient's Cardiovascular Status Stable, Respiratory Function Stable, Patent Airway, No signs of Nausea or vomiting and Pain level controlled  Post-op Vital Signs: Reviewed and stable  Last Vitals:  Filed Vitals:   01/25/15 0803  BP: 140/112  Pulse: 37  Temp:   Resp: 22    Complications: No apparent anesthesia complications

## 2015-01-25 NOTE — Anesthesia Preprocedure Evaluation (Signed)
Anesthesia Evaluation  Patient identified by MRN, date of birth, ID band Patient awake    Reviewed: Allergy & Precautions, NPO status , Patient's Chart, lab work & pertinent test results  Airway Mallampati: III  TM Distance: >3 FB Neck ROM: Full    Dental  (+) Teeth Intact   Pulmonary shortness of breath,  breath sounds clear to auscultation        Cardiovascular hypertension, Pt. on medications +CHF + dysrhythmias Atrial Fibrillation Rhythm:Irregular Rate:Tachycardia     Neuro/Psych    GI/Hepatic negative GI ROS,   Endo/Other  diabetes, Type 2, Oral Hypoglycemic AgentsHypothyroidism   Renal/GU Renal InsufficiencyRenal disease     Musculoskeletal   Abdominal   Peds  Hematology   Anesthesia Other Findings   Reproductive/Obstetrics                             Anesthesia Physical Anesthesia Plan  ASA: III  Anesthesia Plan: MAC   Post-op Pain Management:    Induction: Intravenous  Airway Management Planned: Simple Face Mask  Additional Equipment:   Intra-op Plan:   Post-operative Plan:   Informed Consent: I have reviewed the patients History and Physical, chart, labs and discussed the procedure including the risks, benefits and alternatives for the proposed anesthesia with the patient or authorized representative who has indicated his/her understanding and acceptance.     Plan Discussed with:   Anesthesia Plan Comments:         Anesthesia Quick Evaluation

## 2015-01-25 NOTE — Addendum Note (Signed)
Addendum  created 01/25/15 1355 by Mickel Baas, CRNA   Modules edited: Charges VN

## 2015-01-25 NOTE — Progress Notes (Signed)
*  PRELIMINARY RESULTS* Echocardiogram Echocardiogram Transesophageal has been performed.  Isabella Hall 01/25/2015, 11:17 AM

## 2015-01-25 NOTE — Transfer of Care (Signed)
Immediate Anesthesia Transfer of Care Note  Patient: Isabella Hall  Procedure(s) Performed: Procedure(s): TRANSESOPHAGEAL ECHOCARDIOGRAM (TEE) (N/A) CARDIOVERSION (N/A)  Patient Location: PACU  Anesthesia Type:MAC  Level of Consciousness: awake, alert , oriented and patient cooperative  Airway & Oxygen Therapy: Patient Spontanous Breathing and Patient connected to face mask oxygen  Post-op Assessment: Report given to RN and Post -op Vital signs reviewed and stable  Post vital signs: Reviewed and stable  Last Vitals:  Filed Vitals:   01/25/15 0803  BP: 140/112  Pulse: 37  Temp:   Resp: 22    Complications: No apparent anesthesia complications

## 2015-01-25 NOTE — Anesthesia Procedure Notes (Signed)
Procedure Name: MAC Date/Time: 01/25/2015 10:20 AM Performed by: Andree Elk, AMY A Pre-anesthesia Checklist: Patient identified, Timeout performed, Emergency Drugs available, Suction available and Patient being monitored Oxygen Delivery Method: Simple face mask

## 2015-01-25 NOTE — Progress Notes (Signed)
Pt continually remove BP cuff Cuff cycle time was reduced to accommodate Cuff was still removed by pt

## 2015-01-26 ENCOUNTER — Encounter (HOSPITAL_COMMUNITY): Payer: Self-pay | Admitting: *Deleted

## 2015-01-26 ENCOUNTER — Inpatient Hospital Stay (HOSPITAL_COMMUNITY): Payer: 59

## 2015-01-26 DIAGNOSIS — E669 Obesity, unspecified: Secondary | ICD-10-CM

## 2015-01-26 DIAGNOSIS — I429 Cardiomyopathy, unspecified: Secondary | ICD-10-CM

## 2015-01-26 DIAGNOSIS — I10 Essential (primary) hypertension: Secondary | ICD-10-CM

## 2015-01-26 DIAGNOSIS — I484 Atypical atrial flutter: Secondary | ICD-10-CM

## 2015-01-26 DIAGNOSIS — I483 Typical atrial flutter: Secondary | ICD-10-CM

## 2015-01-26 DIAGNOSIS — N183 Chronic kidney disease, stage 3 (moderate): Secondary | ICD-10-CM

## 2015-01-26 DIAGNOSIS — I481 Persistent atrial fibrillation: Principal | ICD-10-CM

## 2015-01-26 DIAGNOSIS — I248 Other forms of acute ischemic heart disease: Secondary | ICD-10-CM

## 2015-01-26 DIAGNOSIS — R0602 Shortness of breath: Secondary | ICD-10-CM

## 2015-01-26 LAB — GLUCOSE, CAPILLARY
GLUCOSE-CAPILLARY: 135 mg/dL — AB (ref 70–99)
Glucose-Capillary: 100 mg/dL — ABNORMAL HIGH (ref 70–99)
Glucose-Capillary: 109 mg/dL — ABNORMAL HIGH (ref 70–99)
Glucose-Capillary: 123 mg/dL — ABNORMAL HIGH (ref 70–99)

## 2015-01-26 LAB — CBC
HEMATOCRIT: 39.7 % (ref 36.0–46.0)
HEMOGLOBIN: 12.6 g/dL (ref 12.0–15.0)
MCH: 25.7 pg — ABNORMAL LOW (ref 26.0–34.0)
MCHC: 31.7 g/dL (ref 30.0–36.0)
MCV: 81 fL (ref 78.0–100.0)
Platelets: 217 10*3/uL (ref 150–400)
RBC: 4.9 MIL/uL (ref 3.87–5.11)
RDW: 16 % — ABNORMAL HIGH (ref 11.5–15.5)
WBC: 8.6 10*3/uL (ref 4.0–10.5)

## 2015-01-26 LAB — APTT: aPTT: 34 seconds (ref 24–37)

## 2015-01-26 LAB — BASIC METABOLIC PANEL
Anion gap: 5 (ref 5–15)
BUN: 24 mg/dL — ABNORMAL HIGH (ref 6–23)
CO2: 28 mmol/L (ref 19–32)
Calcium: 8.6 mg/dL (ref 8.4–10.5)
Chloride: 106 mmol/L (ref 96–112)
Creatinine, Ser: 1.54 mg/dL — ABNORMAL HIGH (ref 0.50–1.10)
GFR calc non Af Amer: 37 mL/min — ABNORMAL LOW (ref >90)
GFR, EST AFRICAN AMERICAN: 42 mL/min — AB (ref >90)
Glucose, Bld: 140 mg/dL — ABNORMAL HIGH (ref 70–99)
POTASSIUM: 3.6 mmol/L (ref 3.5–5.1)
Sodium: 139 mmol/L (ref 135–145)

## 2015-01-26 LAB — HEPARIN LEVEL (UNFRACTIONATED): HEPARIN UNFRACTIONATED: 0.76 [IU]/mL — AB (ref 0.30–0.70)

## 2015-01-26 LAB — MRSA PCR SCREENING: MRSA by PCR: NEGATIVE

## 2015-01-26 MED ORDER — OFF THE BEAT BOOK
Freq: Once | Status: AC
  Administered 2015-01-26: 1
  Filled 2015-01-26: qty 1

## 2015-01-26 MED ORDER — AMIODARONE HCL IN DEXTROSE 360-4.14 MG/200ML-% IV SOLN
60.0000 mg/h | INTRAVENOUS | Status: AC
  Administered 2015-01-26: 60 mg/h via INTRAVENOUS
  Filled 2015-01-26: qty 200

## 2015-01-26 MED ORDER — DILTIAZEM HCL 100 MG IV SOLR
5.0000 mg/h | INTRAVENOUS | Status: DC
  Administered 2015-01-26: 15 mg/h via INTRAVENOUS
  Administered 2015-01-26: 5 mg/h via INTRAVENOUS
  Administered 2015-01-26: 15 mg/h via INTRAVENOUS
  Administered 2015-01-27: 10 mg/h via INTRAVENOUS
  Administered 2015-01-27 (×2): 15 mg/h via INTRAVENOUS
  Filled 2015-01-26: qty 100

## 2015-01-26 MED ORDER — SODIUM CHLORIDE 0.9 % IJ SOLN: 10.0000 mL | INTRAMUSCULAR | Status: DC | PRN

## 2015-01-26 MED ORDER — DILTIAZEM HCL 30 MG PO TABS
30.0000 mg | ORAL_TABLET | Freq: Once | ORAL | Status: AC
  Administered 2015-01-26: 30 mg via ORAL
  Filled 2015-01-26: qty 1

## 2015-01-26 MED ORDER — SODIUM CHLORIDE 0.9 % IJ SOLN
3.0000 mL | Freq: Two times a day (BID) | INTRAMUSCULAR | Status: DC
  Administered 2015-01-26: 3 mL via INTRAVENOUS

## 2015-01-26 MED ORDER — ADULT MULTIVITAMIN W/MINERALS CH
1.0000 | ORAL_TABLET | Freq: Every day | ORAL | Status: DC
  Administered 2015-01-26 – 2015-01-29 (×4): 1 via ORAL
  Filled 2015-01-26 (×5): qty 1

## 2015-01-26 MED ORDER — SODIUM CHLORIDE 0.9 % IJ SOLN: 3.0000 mL | INTRAMUSCULAR | Status: DC | PRN

## 2015-01-26 MED ORDER — SODIUM CHLORIDE 0.9 % IV SOLN: 250.0000 mL | INTRAVENOUS | Status: DC | PRN

## 2015-01-26 MED ORDER — ASPIRIN 81 MG PO CHEW
81.0000 mg | CHEWABLE_TABLET | ORAL | Status: AC
  Administered 2015-01-27: 81 mg via ORAL
  Filled 2015-01-26: qty 1

## 2015-01-26 MED ORDER — AMIODARONE HCL IN DEXTROSE 360-4.14 MG/200ML-% IV SOLN
30.0000 mg/h | INTRAVENOUS | Status: DC
  Administered 2015-01-26 – 2015-01-27 (×4): 30 mg/h via INTRAVENOUS
  Filled 2015-01-26 (×8): qty 200

## 2015-01-26 MED ORDER — HEPARIN (PORCINE) IN NACL 100-0.45 UNIT/ML-% IJ SOLN
1300.0000 [IU]/h | INTRAMUSCULAR | Status: DC
Start: 2015-01-26 — End: 2015-01-27
  Administered 2015-01-26 – 2015-01-27 (×2): 1300 [IU]/h via INTRAVENOUS
  Filled 2015-01-26 (×3): qty 250

## 2015-01-26 MED ORDER — SODIUM CHLORIDE 0.9 % IV SOLN
1.0000 mL/kg/h | INTRAVENOUS | Status: DC
  Administered 2015-01-26: 1 mL/kg/h via INTRAVENOUS

## 2015-01-26 MED ORDER — DILTIAZEM HCL 25 MG/5ML IV SOLN
10.0000 mg | Freq: Once | INTRAVENOUS | Status: AC
  Administered 2015-01-26: 10 mg via INTRAVENOUS
  Filled 2015-01-26: qty 5

## 2015-01-26 NOTE — Progress Notes (Signed)
Pt is refusing to use the restroom at this time because she does not feel comfortable with a bedpan. Pt is not able to get up at this time because of HR

## 2015-01-26 NOTE — Progress Notes (Signed)
Primary cardiologist: Dr. Kate Sable (previously saw Dr. Sallyanne Kuster 2013)  Seen for followup: Atrial flutter, diastolic heart failure  Subjective:    No chest pain or shortness of breath at rest. Feels palpitations. No abdominal discomfort.  Objective:   Temp:  [98 F (36.7 C)-98.3 F (36.8 C)] 98.3 F (36.8 C) (02/09 0000) Pulse Rate:  [25-175] 25 (02/09 0600) Resp:  [12-26] 14 (02/09 0600) BP: (112-154)/(67-95) 125/68 mmHg (02/09 0600) SpO2:  [87 %-100 %] 99 % (02/09 0600) Last BM Date: 01/21/15  Filed Weights   01/23/15 0500 01/24/15 0500 01/25/15 0500  Weight: 282 lb 13.6 oz (128.3 kg) 274 lb 4 oz (124.4 kg) 271 lb 9.7 oz (123.2 kg)    Intake/Output Summary (Last 24 hours) at 01/26/15 0828 Last data filed at 01/26/15 [id number]  Gross per 24 hour  Intake  364.3 ml  Output      0 ml  Net  364.3 ml    Telemetry: Atrial flutter versus coarse atrial fibrillation with rapid ventricular response.  Exam:  General: Obese, no distress.  Lungs: Nonlabored, decreased breath sounds.  Cardiac: Indistinct PMI. Irregular, no S3 gallop.  Abdomen: NABS.  Extremities: No pitting edema.  Lab Results:  Basic Metabolic Panel:  Recent Labs Lab 01/19/15 2112 01/20/15 0709 01/21/15 0447  NA 136 136 139  K 4.0 4.0 3.7  CL 104 102 106  CO2 23 24 28   GLUCOSE 277* 262* 140*  BUN 25* 28* 39*  CREATININE 1.48* 1.42* 1.44*  CALCIUM 8.7 8.9 8.3*    CBC:  Recent Labs Lab 01/19/15 2112 01/21/15 0447  WBC 13.4* 10.0  HGB 12.3 10.5*  HCT 40.1 34.0*  MCV 83.0 81.1  PLT 237 178    Cardiac Enzymes:  Recent Labs Lab 01/22/15 1312 01/22/15 1919 01/23/15 0047  TROPONINI <0.03 <0.03 <0.03    Coagulation:  Recent Labs Lab 01/19/15 2112  INR 1.15    ECG: Post-DCCV tracing with sinus rhythm with anterolateral ST abnormalities, T wave inversions. Prolonged QTc 516 ms.  Echocardiogram 01/21/15: Study Conclusions  - Procedure narrative: Transthoracic  echocardiography. Image quality was poor. The study was technically difficult, as a result of body habitus. - Left ventricle: The cavity size was normal. There was moderate concentric hypertrophy. Systolic function was low normal. The estimated ejection fraction was approximately 50%. Images were inadequate for LV wall motion assessment. Findings consistent with left ventricular diastolic dysfunction. Doppler parameters are consistent with high ventricular filling pressure. - Mitral valve: There was moderate eccentric regurgitation. - Left atrium: The atrium was mildly dilated. Volume/bsa, ES, (1-plane Simpson&'s, A2C): 31.1 ml/m^2.   Medications:   Scheduled Medications: . insulin aspart  0-5 Units Subcutaneous QHS  . insulin aspart  0-9 Units Subcutaneous TID WC  . levothyroxine  150 mcg Oral QAC breakfast  . ondansetron (ZOFRAN) IV  4 mg Intravenous Once  . rivaroxaban  20 mg Oral Daily  . sodium chloride  3 mL Intravenous Q12H  . sodium chloride  3 mL Intravenous Q12H    Infusions: . amiodarone    . amiodarone    . diltiazem (CARDIZEM) infusion 15 mg/hr (01/26/15 0635)  . lactated ringers      PRN Medications: sodium chloride, fentaNYL, midazolam, phenol, sodium chloride, sodium chloride   Assessment:   1. Recurrent atypical atrial flutter (versus coarse atrial fibrillation) with rapid ventricular response. CHADSVASC score is 4. She is on Xarelto which was initiated on February 4, and most recently on oral diltiazem with IV amiodarone  over the weekend. Underwent TEE cardioversion yesterday with restoration of sinus rhythm. Amiodarone infusion stopped yesterday PM. Developed recurrent atrial arrhythmia overnight, now back on IV diltiazem.  2. History of PAF 2013.  3. Question cardiomyopathy. LVEF reported at approximately 50% by initial transthoracic echocardiogram, 40-45% by TEE yesterday in the setting of rapid atrial flutter. ECG in sinus rhythm is  abnormal with significant anterolateral ST-T wave abnormalities including T-wave inversions and prolonged QT interval. Initial troponin I abnormal 0.13 with decreased from there, demand ischemia felt to be more likely than ACS.  4. Essential hypertension.  5. Possible obstructive sleep apnea. She will need formal workup via sleep study.  6. CKD stage 3, creatinine 1.4.  7. Type 2 diabetes mellitus.   Plan/Discussion:    Situation discussed with the patient, also reviewed with Dr. [name] by phone. Plan at this time is to transfer Ms. Ringe to our cardiology service at [facility] for further evaluation. In light of her abnormal ECG in sinus rhythm with potential ischemic changes, evidence of cardiomyopathy, and mildly abnormal troponin I levels at presentation, would recommend diagnostic cardiac catheterization to clearly evaluate her coronary anatomy and exclude any ischemic precipitants. Need to reassess renal function and ideally get her heart rate better controlled first however. She will need to come off of Xarelto temporarily. As far as her atrial arrhythmia is concerned, we will reinitiate IV amiodarone and continue diltiazem. EP consultation will be obtained as well. Not a good candidate for Tikosyn in light of prolonged QT, perhaps she could be considered for ablation, although this does not look to be straight-forward typical atrial flutter.   Satira Sark, M.D., F.A.C.C.

## 2015-01-26 NOTE — Consult Note (Addendum)
ELECTROPHYSIOLOGY CONSULT NOTE    Patient ID: Isabella Hall MRN: [id number], DOB/AGE: 57-04-1956 56 y.o.  Admit date: 01/19/2015 Date of Consult: 01/26/2015  Primary Physician: Robert Bellow, MD Primary Cardiologist: [name]  Reason for Consultation: atrial fibrillation  HPI:  Isabella Hall is a 59 y.o. female with a past medical history significant for hypertension, hypothyroidism, obesity, diabetes, and persistent atrial fibrillation.  She was first diagnosed with atrial fibrillation in 2013 at which time she presented to the hospital in AF with RVR and converted with Diltiazem.  She has had no recurrent palpitations and did well a week ago when she developed palpitations and shortness of breath.  She called EMS on the day of admission because of worsening shortness of breath and was found to be in AF with RVR.  She was taken to [facility] and was initially treated with medical therapy but had persistent atrial fibrillation.  She was placed on amiodarone and underwent TEE/DCCV 01-25-15 at which time amiodarone was discontinued.  Today, she had recurrent AF with RVR and was transferred to [facility] for further evaluation and cardiac catheterization.    Echocardiogram demonstrated EF 50-55%, moderate MR, LA 45.  Lab work is reviewed.  She currently denies chest pain, shortness of breath, LE edema.  She denies recent fevers, chills, nausea or vomiting.   ROS is otherwise negative.  She denies tobacco, alcohol, or recreational drug use.  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   EP has been asked to evaluate for treatment options.   Past Medical History  Diagnosis Date  . Essential hypertension   . Hypothyroidism   . PAF (paroxysmal atrial fibrillation) 10/2012    CHADSVASC score 4  . Obesity   . Atrial flutter   . Type 2 diabetes mellitus      Surgical History:  Past Surgical History  Procedure Laterality Date  . Cholecystectomy  1979  . Tubal ligation  1980  . Tee  without cardioversion N/A 01/25/2015    Procedure: TRANSESOPHAGEAL ECHOCARDIOGRAM (TEE);  Surgeon: Satira Sark, MD;  Location: [facility];  Service: Cardiovascular;  Laterality: N/A;  . Cardioversion N/A 01/25/2015    Procedure: CARDIOVERSION;  Surgeon: Satira Sark, MD;  Location: [facility];  Service: Cardiovascular;  Laterality: N/A;     Prescriptions prior to admission  Medication Sig Dispense Refill Last Dose  . aspirin EC 81 MG tablet Take 81 mg by mouth daily.   01/19/2015 at Unknown time  . diltiazem (CARDIZEM) 120 MG tablet Take 1 tablet (120 mg total) by mouth 2 (two) times daily. 60 tablet 11 01/16/2015  . levothyroxine (SYNTHROID) 150 MCG tablet Take 1 tablet (150 mcg total) by mouth daily. 30 tablet 11 01/19/2015 at Unknown time  . Multiple Vitamin (MULTIVITAMIN WITH MINERALS) TABS tablet Take 1 tablet by mouth daily.   01/19/2015 at Unknown time  . GARLIC PO Take 1 tablet by mouth daily.   11/08/2012  . sodium chloride (OCEAN) 0.65 % nasal spray Place 1 spray into both nostrils 3 (three) times daily as needed. Uses daily, up to three times per day during allergy season   11/08/2012    Inpatient Medications:  . insulin aspart  0-5 Units Subcutaneous QHS  . insulin aspart  0-9 Units Subcutaneous TID WC  . levothyroxine  150 mcg Oral QAC breakfast  . multivitamin with minerals  1 tablet Oral Daily  . rivaroxaban  20 mg Oral Daily  . sodium chloride  3 mL Intravenous  Q12H  . sodium chloride  3 mL Intravenous Q12H    Allergies: No Known Allergies  History   Social History  . Marital Status: Married    Spouse Name: N/A    Number of Children: N/A  . Years of Education: N/A   Occupational History  . Not on file.   Social History Main Topics  . Smoking status: Never Smoker   . Smokeless tobacco: Not on file  . Alcohol Use: No  . Drug Use: No  . Sexual Activity: Not on file   Other Topics Concern  . Not on file   Social History Narrative     Family History  Problem  Relation Age of Onset  . Atrial fibrillation Neg Hx   . Diabetes Neg Hx   . Hypertension    . Coronary artery disease Sister     coronary stents  . Cancer Mother   . Heart disease Father   . Cancer Maternal Grandmother     BP 136/77 mmHg  Pulse 106  Temp(Src) 98.6 F (37 C) (Oral)  Resp 22  Ht 5\' 6"  (1.676 m)  Wt 271 lb 9.7 oz (123.2 kg)  BMI 43.86 kg/m2  SpO2 96%  Physical Exam: Filed Vitals:   01/26/15 1300 01/26/15 1400 01/26/15 1500 01/26/15 1600  BP: 152/103 136/77 123/77 129/88  Pulse: 136 106 72 117  Temp:      TempSrc:      Resp: 22 22 22 18   Height:      Weight:      SpO2: 92% 96% 93% 99%    GEN- The patient is overweight appearing, alert and oriented x 3 today.   Head- normocephalic, atraumatic Eyes-  Sclera clear, conjunctiva pink Ears- hearing intact Oropharynx- clear Neck- supple, Lungs- Clear to ausculation bilaterally, normal work of breathing Heart- tachycardic irregular rhythm, no murmurs, rubs or gallops, PMI not laterally displaced GI- soft, NT, ND, + BS Extremities- no clubbing, cyanosis, or edema  MS- no significant deformity or atrophy Skin- no rash or lesion Psych- euthymic mood, full affect Neuro- strength and sensation are intact    Labs:   Lab Results  Component Value Date   WBC 8.6 01/26/2015   HGB 12.6 01/26/2015   HCT 39.7 01/26/2015   MCV 81.0 01/26/2015   PLT 217 01/26/2015    Recent Labs Lab 01/26/15 0843  NA 139  K 3.6  CL 106  CO2 28  BUN 24*  CREATININE 1.54*  CALCIUM 8.6  GLUCOSE 140*     Radiology/Studies: Ct Angio Chest Pe W/cm &/or Wo Cm 01/19/2015   CLINICAL DATA:  Respiratory distress. Shortness of breath and diaphoresis for 1 day.  EXAM: CT ANGIOGRAPHY CHEST WITH CONTRAST  TECHNIQUE: Multidetector CT imaging of the chest was performed using the standard protocol during bolus administration of intravenous contrast. Multiplanar CT image reconstructions and MIPs were obtained to evaluate the vascular  anatomy.  CONTRAST:  71mL OMNIPAQUE IOHEXOL 350 MG/ML SOLN  COMPARISON:  Radiograph earlier this day.  FINDINGS: There are no filling defects within the pulmonary arteries to suggest pulmonary embolus.  The thoracic aorta is normal in caliber without evidence of dissection. Heart is enlarged. Mild reflux of contrast into the IVC. There are bilateral pleural effusions with adjacent atelectasis in the lower lobes. Mild diffuse ground-glass opacities suggestive of pulmonary edema. There is central bronchial thickening. No focal consolidation to suggest pneumonia. No pulmonary mass. Scattered linear atelectasis throughout both lung. There are small superior mediastinal lymph nodes  are likely reactive. No hilar adenopathy. No axillary adenopathy. There is no pericardial effusion.  No definite acute abnormality in the included upper abdomen. There are no acute or suspicious osseous abnormalities.  Review of the MIP images confirms the above findings.  IMPRESSION: 1. No pulmonary embolus. 2. Findings consistent with CHF with cardiomegaly, bilateral pleural effusions and diffuse ground-glass opacities likely pulmonary edema.   Electronically Signed   By: Jeb Levering M.D.   On: 01/19/2015 23:36   Dg Chest Port 1 View 01/19/2015   CLINICAL DATA:  Dyspnea and respiratory distress. Symptoms for 1 day. Initial encounter.  EXAM: PORTABLE CHEST - 1 VIEW  COMPARISON:  11/09/2012  FINDINGS: The heart is enlarged. There is perivascular haziness concerning for pulmonary edema and mild vascular congestion. Ill-defined hazy opacity at the lung bases, may reflect small pleural effusions versus soft tissue attenuation from body habitus. No pneumothorax. No confluent airspace disease to suggest pneumonia. No acute osseous abnormalities are seen.  IMPRESSION: Cardiomegaly and probable pulmonary edema, findings suggest CHF. Questionable pleural effusions versus soft tissue attenuation from body habitus.   Electronically Signed   By:  Jeb Levering M.D.   On: 01/19/2015 21:39    EKG today reveals sinus rhythm with prolonged QT, lateral TWI ekgs are reviewed and very worrisome QT prolongation with deep TWI suggestive of ischemic is observed.  There are also ekgs revealing atrial fibrillation (coarse) with RVR  TELEMETRY: AF with RVR, ventricular rates 90-130's  A/P 1. Persistent afib The patient has symptomatic persistent afib.  Given QT prolongation (per my conversation with Dr Domenic Polite this precedes amiodarone), she is not a candidate for sotalol or tikosyn.  Given EF 45% by TEE, I think that we should avoid flecainide.  At this time, I believe that amiodarone is the best treatment option for this patient.  Risks, benefits, and alternatives to amiodarone were discussed with the patient who wishes to proceed with this medicine which has already been initiated by Dr Domenic Polite. Will hold xarelto for cath tomorrow.  Place on heparin drip overnight and resume xarelto once stable post cath. Will need to repeat cardioversion in next 24-48 hours after additional amiodarone is loaded. [email or web address] score is at least 4 (female, diabetes, hypertension, EF 45%)  2. Snoring/ obesity Will need outpatient sleep study Weight loss is essential for long term maintenance of sinus  3. SOB Possibly due to afib, though given deep TWI, I share Dr McDowell's concern for ischemia.  I would therefore recommend left heart catheterization with possible PCI.  Discussed the cath with the patient. The patient understands that risks included but are not limited to stroke (1 in 1000), death (1 in 6), kidney failure [usually temporary] (1 in 500), bleeding (1 in 200), allergic reaction [possibly serious] (1 in 200). The patient understands and agrees to proceed.  We will therefore schedule cath for tomorrow.   4. CRI Will gently hydrate for cath tomorrow  5. QT prolongation Avoid QT prolonging drugs as able  Thompson Grayer MD 01/26/2015 4:40  PM

## 2015-01-26 NOTE — Progress Notes (Signed)
[facility] PROGRESS NOTE  Isabella Hall [id number] DOB: March 25, 1956 DOA: 01/19/2015 PCP: Robert Bellow, MD  Assessment/Plan: 59 y/o female with PMH of DM, PAF, HTN presented to the emergency department with shortness of breath, found to have significant tachycardia/rapid ventricular response with associated hypercapnic respiratory failure requiring BiPAP.  -Respiratory status rapidly improved and has remained stable, her hospitalization has been prolonged by atrial flutter with variable conduction. She converted to sinus rhythm after an IV amiodarone bolus but then reverted to atrial flutter which has continued despite IV amiodarone infusion. Patient underwent TEE guided cardioversion 2/8 but again went to fib RVR overnight    1. A fib RVR, on amiodarone infusion, Cardizem. Continue Xarelto. Failed DCCV 2/8;  -d/w cardiology, who plans to TF to [facility] cardiology service for further evaluation, management; defer further care to cardiology   2. Chest pain secondary to rapid rate. Resolved. Initial troponins on admission suggested demand ischemia. Repeat troponins negative. Defer to cardiology  3. Acute diastolic heart failure resolved. Secondary to tachycardia arrhythmia. LV systolic function low normal. Prn lasix  4. Hypothyroidism. TSH WNL. Continue Synthroid. 5. Diabetes mellitus type 2. Stable. ha1c-6.8; not on meds; cont ISS for now 6. Suspected sleep apnea. Outpatient sleep study recommended.  D/w cardiology, who plans to TF patient to [facility] cardiology service for further evaluation, management  -hospitalist will sign off at this time   Code Status: full Family Communication: d/w patient, cardiologist  (indicate person spoken with, relationship, and if by phone, the number) Disposition Plan: TF to [facility] cardiology service    Consultants:  Cardiology   Procedures:  DCCV on 2/8  Antibiotics:  none (indicate start date, and stop date if  known)  HPI/Subjective: alert  Objective: Filed Vitals:   01/26/15 0600  BP: 125/68  Pulse: 25  Temp:   Resp: 14    Intake/Output Summary (Last 24 hours) at 01/26/15 0857 Last data filed at 01/26/15 [id number]  Gross per 24 hour  Intake  364.3 ml  Output      0 ml  Net  364.3 ml   Filed Weights   01/23/15 0500 01/24/15 0500 01/25/15 0500  Weight: 128.3 kg (282 lb 13.6 oz) 124.4 kg (274 lb 4 oz) 123.2 kg (271 lb 9.7 oz)    Exam:   General:  alert  Cardiovascular: s1,s2 tachycardia   Respiratory: CTA BL  Abdomen: soft, nt, nd   Musculoskeletal: no LE edema    Data Reviewed: Basic Metabolic Panel:  Recent Labs Lab 01/19/15 2112 01/20/15 0709 01/21/15 0447  NA 136 136 139  K 4.0 4.0 3.7  CL 104 102 106  CO2 23 24 28   GLUCOSE 277* 262* 140*  BUN 25* 28* 39*  CREATININE 1.48* 1.42* 1.44*  CALCIUM 8.7 8.9 8.3*   Liver Function Tests: No results for input(s): AST, ALT, ALKPHOS, BILITOT, PROT, ALBUMIN in the last 168 hours. No results for input(s): LIPASE, AMYLASE in the last 168 hours. No results for input(s): AMMONIA in the last 168 hours. CBC:  Recent Labs Lab 01/19/15 2112 01/21/15 0447  WBC 13.4* 10.0  NEUTROABS 7.2  --   HGB 12.3 10.5*  HCT 40.1 34.0*  MCV 83.0 81.1  PLT 237 178   Cardiac Enzymes:  Recent Labs Lab 01/20/15 0709 01/20/15 1342 01/22/15 1312 01/22/15 1919 01/23/15 0047  TROPONINI 0.08* 0.06* <0.03 <0.03 <0.03   BNP (last 3 results)  Recent Labs  01/19/15 2113 01/20/15 0122  BNP 688.0* 496.0*    ProBNP (  last 3 results) No results for input(s): PROBNP in the last 8760 hours.  CBG:  Recent Labs Lab 01/24/15 0722 01/24/15 1107 01/24/15 1633 01/24/15 2120 01/25/15 0736  GLUCAP 120* 114* 119* 107* 139*    Recent Results (from the past 240 hour(s))  MRSA PCR Screening     Status: None   Collection Time: 01/20/15  1:10 AM  Result Value Ref Range Status   MRSA by PCR NEGATIVE NEGATIVE Final    Comment:         The GeneXpert MRSA Assay (FDA approved for NASAL specimens only), is one component of a comprehensive MRSA colonization surveillance program. It is not intended to diagnose MRSA infection nor to guide or monitor treatment for MRSA infections.      Studies: No results found.  Scheduled Meds: . insulin aspart  0-5 Units Subcutaneous QHS  . insulin aspart  0-9 Units Subcutaneous TID WC  . levothyroxine  150 mcg Oral QAC breakfast  . ondansetron (ZOFRAN) IV  4 mg Intravenous Once  . rivaroxaban  20 mg Oral Daily  . sodium chloride  3 mL Intravenous Q12H  . sodium chloride  3 mL Intravenous Q12H   Continuous Infusions: . amiodarone 60 mg/hr (01/26/15 0847)  . amiodarone    . diltiazem (CARDIZEM) infusion 15 mg/hr (01/26/15 0635)  . lactated ringers      Principal Problem:   Atrial flutter Active Problems:   Diabetes type 2, controlled   Benign essential HTN   Hypothyroid- TSH WNL   Obesity-BMI 45   Elevated troponin   CKD (chronic kidney disease), stage III   Demand ischemia   Moderate mitral regurgitation    Time spent: >35 minutes     Kinnie Feil  [facility] Pager [phone]. If 7PM-7AM, please contact night-coverage at [email or web address], password [facility] 01/26/2015, 8:57 AM  LOS: 7 days

## 2015-01-26 NOTE — Progress Notes (Signed)
Called Hospitalist Dr Maudie Mercury.  Pt heart rate increased to a fib 160s  Order for med received followed \

## 2015-01-26 NOTE — Care Management Utilization Note (Signed)
UR completed 

## 2015-01-26 NOTE — Progress Notes (Signed)
Peripherally Inserted Central Catheter/Midline Placement  The IV Nurse has discussed with the patient and/or persons authorized to consent for the patient, the purpose of this procedure and the potential benefits and risks involved with this procedure.  The benefits include less needle sticks, lab draws from the catheter and patient may be discharged home with the catheter.  Risks include, but not limited to, infection, bleeding, blood clot (thrombus formation), and puncture of an artery; nerve damage and irregular heat beat.  Alternatives to this procedure were also discussed.  PICC/Midline Placement Documentation  PICC Triple Lumen [id number] PICC Right Basilic 43 cm 0 cm (Active)       Isabella Hall M 01/26/2015, 10:08 PM

## 2015-01-26 NOTE — Progress Notes (Signed)
Patient transferred to [facility], via [facility]. Report given to [employer], and 2H nurse Sharlette Dense RN. Patient in A fib with RVR at transfer, on cardizem and amiodarone gtts.

## 2015-01-26 NOTE — Progress Notes (Addendum)
ANTICOAGULATION CONSULT NOTE - Initial Consult  Pharmacy Consult for Heparin Indication: atrial fibrillation  No Known Allergies  Patient Measurements: Height: 5\' 6"  (167.6 cm) Weight: 271 lb 9.7 oz (123.2 kg) IBW/kg (Calculated) : 59.3 Heparin Dosing Weight: 89 kg  Vital Signs: Temp: 98.6 F (37 C) (02/09 1200) Temp Source: Oral (02/09 1200) BP: 129/88 mmHg (02/09 1600) Pulse Rate: 117 (02/09 1600)  Labs:  Recent Labs  01/26/15 0843  HGB 12.6  HCT 39.7  PLT 217  CREATININE 1.54*    Estimated Creatinine Clearance: 54.7 mL/min (by C-G formula based on Cr of 1.54).   Medical History: Past Medical History  Diagnosis Date  . Essential hypertension   . Hypothyroidism   . Atrial fibrillation      CHADSVASC score 4  . Obesity   . Atrial flutter   . Type 2 diabetes mellitus     Medications:  Prescriptions prior to admission  Medication Sig Dispense Refill Last Dose  . aspirin EC 81 MG tablet Take 81 mg by mouth daily.   01/19/2015 at Unknown time  . diltiazem (CARDIZEM) 120 MG tablet Take 1 tablet (120 mg total) by mouth 2 (two) times daily. 60 tablet 11 01/16/2015  . levothyroxine (SYNTHROID) 150 MCG tablet Take 1 tablet (150 mcg total) by mouth daily. 30 tablet 11 01/19/2015 at Unknown time  . Multiple Vitamin (MULTIVITAMIN WITH MINERALS) TABS tablet Take 1 tablet by mouth daily.   01/19/2015 at Unknown time  . GARLIC PO Take 1 tablet by mouth daily.   11/08/2012  . sodium chloride (OCEAN) 0.65 % nasal spray Place 1 spray into both nostrils 3 (three) times daily as needed. Uses daily, up to three times per day during allergy season   11/08/2012    Assessment: 59 y.o. female presented 2/3 with dyspnea. Found to be in afib with RVR (pt with h/o PAF) - s/p TEE cardioversion 2/8 with return to NSR. Developed recurent atrial arrhythmia overnight after amio gtt stopped. Xarelto started on 2/4 - last 20mg  dose 2/8 0900. Plan to hold Xarelto for now and start heparin gtt as pt  needs cath to exclude any ischemia. CBC stable. CrCl ~55 ml/min. Xarelto will likely still be affecting the anti-Xa/heparin level so will probably need to use aPTT to monitor heparin.  Plan for cath in a.m.  Goal of Therapy:  Heparin level 0.3-0.7 units/ml; aPTT 66-102 sec Monitor platelets by anticoagulation protocol: Yes   Plan:  1. D/c Xarelto 2. Initiate heparin gtt at 1300 units/hr. No bolus. 3. Will f/u baseline aPTT, heparin level 4. Daily heparin level and aPTT  Sherlon Handing, PharmD, BCPS Clinical pharmacist, pager [phone] 01/26/2015,4:32 PM    Addendum: Baseline heparin level 0.76 (elevated due to Xarelto) and aPTT 34.  Plan: 1) Will check 6 hour aPTT  2) Daily aPTT and heparin level -more than likely will be correlating in the a.m.  Sherlon Handing, PharmD, BCPS Clinical pharmacist, pager [phone] 01/26/2015 7:48 PM

## 2015-01-27 ENCOUNTER — Encounter (HOSPITAL_COMMUNITY): Payer: Self-pay | Admitting: Interventional Cardiology

## 2015-01-27 ENCOUNTER — Encounter (HOSPITAL_COMMUNITY)
Admission: EM | Disposition: A | Discharge status: Home / Self Care | Payer: Self-pay | Source: Home / Self Care | Attending: Family Medicine

## 2015-01-27 DIAGNOSIS — E876 Hypokalemia: Secondary | ICD-10-CM

## 2015-01-27 DIAGNOSIS — I4581 Long QT syndrome: Secondary | ICD-10-CM

## 2015-01-27 DIAGNOSIS — R7989 Other specified abnormal findings of blood chemistry: Secondary | ICD-10-CM

## 2015-01-27 HISTORY — PX: LEFT HEART CATHETERIZATION WITH CORONARY ANGIOGRAM: SHX5451

## 2015-01-27 LAB — BASIC METABOLIC PANEL
Anion gap: 8 (ref 5–15)
Anion gap: 10 (ref 5–15)
BUN: 13 mg/dL (ref 6–23)
BUN: 12 mg/dL (ref 6–23)
CALCIUM: 7.7 mg/dL — AB (ref 8.4–10.5)
CHLORIDE: 108 mmol/L (ref 96–112)
CO2: 22 mmol/L (ref 19–32)
CO2: 22 mmol/L (ref 19–32)
Calcium: 6.6 mg/dL — ABNORMAL LOW (ref 8.4–10.5)
Chloride: 108 mmol/L (ref 96–112)
Creatinine, Ser: 1.08 mg/dL (ref 0.50–1.10)
Creatinine, Ser: 1.17 mg/dL — ABNORMAL HIGH (ref 0.50–1.10)
GFR calc Af Amer: 65 mL/min — ABNORMAL LOW (ref >90)
GFR calc Af Amer: 59 mL/min — ABNORMAL LOW (ref >90)
GFR calc non Af Amer: 56 mL/min — ABNORMAL LOW (ref >90)
GFR, EST NON AFRICAN AMERICAN: 51 mL/min — AB (ref >90)
Glucose, Bld: 254 mg/dL — ABNORMAL HIGH (ref 70–99)
Glucose, Bld: 189 mg/dL — ABNORMAL HIGH (ref 70–99)
Potassium: 2.7 mmol/L — CL (ref 3.5–5.1)
Potassium: 4.4 mmol/L (ref 3.5–5.1)
SODIUM: 138 mmol/L (ref 135–145)
Sodium: 140 mmol/L (ref 135–145)

## 2015-01-27 LAB — GLUCOSE, CAPILLARY
GLUCOSE-CAPILLARY: 143 mg/dL — AB (ref 70–99)
GLUCOSE-CAPILLARY: 141 mg/dL — AB (ref 70–99)
GLUCOSE-CAPILLARY: 110 mg/dL — AB (ref 70–99)
Glucose-Capillary: 89 mg/dL (ref 70–99)
Glucose-Capillary: 130 mg/dL — ABNORMAL HIGH (ref 70–99)
Glucose-Capillary: 109 mg/dL — ABNORMAL HIGH (ref 70–99)

## 2015-01-27 LAB — APTT
APTT: 82 s — AB (ref 24–37)
APTT: 86 s — AB (ref 24–37)

## 2015-01-27 LAB — CBC
HCT: 32.2 % — ABNORMAL LOW (ref 36.0–46.0)
HEMOGLOBIN: 10.4 g/dL — AB (ref 12.0–15.0)
MCH: 25.5 pg — AB (ref 26.0–34.0)
MCHC: 32.3 g/dL (ref 30.0–36.0)
MCV: 78.9 fL (ref 78.0–100.0)
Platelets: 173 10*3/uL (ref 150–400)
RBC: 4.08 MIL/uL (ref 3.87–5.11)
RDW: 15.8 % — ABNORMAL HIGH (ref 11.5–15.5)
WBC: 6.9 10*3/uL (ref 4.0–10.5)

## 2015-01-27 LAB — HEPARIN LEVEL (UNFRACTIONATED)
HEPARIN UNFRACTIONATED: 0.53 [IU]/mL (ref 0.30–0.70)
Heparin Unfractionated: 0.58 IU/mL (ref 0.30–0.70)

## 2015-01-27 LAB — MAGNESIUM: MAGNESIUM: 1.8 mg/dL (ref 1.5–2.5)

## 2015-01-27 SURGERY — LEFT HEART CATHETERIZATION WITH CORONARY ANGIOGRAM

## 2015-01-27 MED ORDER — OXYCODONE-ACETAMINOPHEN 5-325 MG PO TABS: 1.0000 | ORAL_TABLET | ORAL | Status: DC | PRN

## 2015-01-27 MED ORDER — POTASSIUM CHLORIDE CRYS ER 20 MEQ PO TBCR
40.0000 meq | EXTENDED_RELEASE_TABLET | Freq: Once | ORAL | Status: AC
  Administered 2015-01-27: 40 meq via ORAL
  Filled 2015-01-27: qty 2

## 2015-01-27 MED ORDER — HEPARIN (PORCINE) IN NACL 100-0.45 UNIT/ML-% IJ SOLN
1300.0000 [IU]/h | INTRAMUSCULAR | Status: AC
  Administered 2015-01-27: 1300 [IU]/h via INTRAVENOUS
  Filled 2015-01-27 (×2): qty 250

## 2015-01-27 MED ORDER — SODIUM CHLORIDE 0.9 % IJ SOLN
10.0000 mL | Freq: Two times a day (BID) | INTRAMUSCULAR | Status: DC
  Administered 2015-01-27: 10 mL

## 2015-01-27 MED ORDER — MIDAZOLAM HCL 2 MG/2ML IJ SOLN
INTRAMUSCULAR | Status: AC
  Filled 2015-01-27: qty 2

## 2015-01-27 MED ORDER — ONDANSETRON HCL 4 MG/2ML IJ SOLN: 4.0000 mg | Freq: Four times a day (QID) | INTRAMUSCULAR | Status: DC | PRN

## 2015-01-27 MED ORDER — LIDOCAINE HCL (PF) 1 % IJ SOLN
INTRAMUSCULAR | Status: AC
  Filled 2015-01-27: qty 30

## 2015-01-27 MED ORDER — POTASSIUM CHLORIDE CRYS ER 20 MEQ PO TBCR
40.0000 meq | EXTENDED_RELEASE_TABLET | Freq: Every day | ORAL | Status: DC
  Administered 2015-01-28 – 2015-01-29 (×2): 40 meq via ORAL
  Filled 2015-01-27 (×3): qty 2

## 2015-01-27 MED ORDER — FENTANYL CITRATE 0.05 MG/ML IJ SOLN
INTRAMUSCULAR | Status: AC
  Filled 2015-01-27: qty 2

## 2015-01-27 MED ORDER — POTASSIUM CHLORIDE 10 MEQ/100ML IV SOLN
10.0000 meq | INTRAVENOUS | Status: AC
  Administered 2015-01-27 (×4): 10 meq via INTRAVENOUS
  Filled 2015-01-27 (×4): qty 100

## 2015-01-27 MED ORDER — HEPARIN SODIUM (PORCINE) 1000 UNIT/ML IJ SOLN
INTRAMUSCULAR | Status: AC
  Filled 2015-01-27: qty 1

## 2015-01-27 MED ORDER — MORPHINE SULFATE 2 MG/ML IJ SOLN: 2.0000 mg | INTRAMUSCULAR | Status: DC | PRN

## 2015-01-27 MED ORDER — HEPARIN (PORCINE) IN NACL 2-0.9 UNIT/ML-% IJ SOLN
INTRAMUSCULAR | Status: AC
  Filled 2015-01-27: qty 1500

## 2015-01-27 MED ORDER — VERAPAMIL HCL 2.5 MG/ML IV SOLN
INTRAVENOUS | Status: AC
  Filled 2015-01-27: qty 2

## 2015-01-27 MED ORDER — SODIUM CHLORIDE 0.9 % IV SOLN
INTRAVENOUS | Status: AC
  Administered 2015-01-27: 17:00:00 via INTRAVENOUS

## 2015-01-27 MED ORDER — NITROGLYCERIN 1 MG/10 ML FOR IR/CATH LAB
INTRA_ARTERIAL | Status: AC
  Filled 2015-01-27: qty 10

## 2015-01-27 MED ORDER — ACETAMINOPHEN 325 MG PO TABS: 650.0000 mg | ORAL_TABLET | ORAL | Status: DC | PRN

## 2015-01-27 NOTE — CV Procedure (Signed)
     Left Heart Catheterization with Coronary Angiography  Report  Isabella Hall  59 y.o.  female 16-Jan-1956  Procedure Date: 01/27/2015 Referring Physician: Thompson Grayer, M.D. Primary Cardiologist: Thompson Grayer, M.D.  INDICATIONS: Elevated troponin in the setting of atrial flutter in a patient with risk factors for coronary disease.  PROCEDURE: 1. Left heart catheterization; 2. Coronary angiography; 3. Left ventricular hemodynamic recordings  CONSENT:  The risks, benefits, and details of the procedure were explained in detail to the patient. Risks including death, stroke, heart attack, kidney injury, allergy, limb ischemia, bleeding and radiation injury were discussed.  The patient verbalized understanding and wanted to proceed.  Informed written consent was obtained.  PROCEDURE TECHNIQUE:  After Xylocaine anesthesia a 5 French Slender sheath was placed in the right radial artery with an angiocath and the modified Seldinger technique.  Coronary angiography was done using a 5 F JR4 catheter.  Left ventriculography was done using the JR 4 catheter and hand injection.   Hemostasis was achieved with a right radial wrist band   CONTRAST:  Total of 70 cc.  COMPLICATIONS:  None   HEMODYNAMICS:  Aortic pressure 154/113 mmHg; LV pressure 157/18 mmHg; LVEDP 19 mmHg  ANGIOGRAPHIC DATA:   The left main coronary artery is normal.  The left anterior descending artery is widely patent and contains minimal mid vessel luminal irregularity. A large first diagonal arises proximally is also widely patent.  The left circumflex artery is normal. It gives origin to 3 obtuse marginal branches. The second obtuse marginal is the largest of the 3 and is significantly tortuous as is the third obtuse marginal.  The right coronary artery is dominant and normal..   LEFT VENTRICULOGRAM:  Left ventricular pressures were recorded but angiography was not performed. Recent transesophageal and transthoracic echo  demonstrated an ejection fraction in the 40-50% range. The left ventricular filling pressures are mildly elevated (see above)   IMPRESSIONS:  1. Normal coronary arteries. 2. Diastolic heart failure, with mildly elevated LV filling pressures. LVEF 40-50% based upon recent echocardiography   RECOMMENDATION:  Per the treating team. IV heparin without a bolus will be resumed in 3 hours.Marland Kitchen

## 2015-01-27 NOTE — Progress Notes (Addendum)
ANTICOAGULATION CONSULT NOTE - Follow Up Consult  Pharmacy Consult for heparin Indication: atrial fibrillation  No Known Allergies  Patient Measurements: Height: 5\' 6"  (167.6 cm) Weight: 270 lb 15.1 oz (122.9 kg) IBW/kg (Calculated) : 59.3 Heparin Dosing Weight: 89kg  Vital Signs: Temp: 97.7 F (36.5 C) (02/10 1150) Temp Source: Oral (02/10 1150) BP: 148/93 mmHg (02/10 1212) Pulse Rate: 131 (02/10 1212)  Labs:  Recent Labs  01/26/15 0843 01/26/15 1827 01/27/15 0200 01/27/15 0600 01/27/15 0830 01/27/15 1030  HGB 12.6  --  10.4*  --   --   --   HCT 39.7  --  32.2*  --   --   --   PLT 217  --  173  --   --   --   APTT  --  34 82*  --  86*  --   HEPARINUNFRC  --  0.76* 0.58  --   --  0.53  CREATININE 1.54*  --   --  1.08  --   --     Estimated Creatinine Clearance: 77.8 mL/min (by C-G formula based on Cr of 1.08).   Medications:  Scheduled:  . insulin aspart  0-5 Units Subcutaneous QHS  . insulin aspart  0-9 Units Subcutaneous TID WC  . levothyroxine  150 mcg Oral QAC breakfast  . multivitamin with minerals  1 tablet Oral Daily  . [START ON 01/28/2015] potassium chloride  40 mEq Oral Daily  . sodium chloride  10-40 mL Intracatheter Q12H   Infusions:  . sodium chloride 1 mL/kg/hr (01/27/15 0800)  . amiodarone 30 mg/hr (01/27/15 1120)  . diltiazem (CARDIZEM) infusion 15 mg/hr (01/27/15 0937)  . heparin 1,300 Units/hr (01/27/15 0800)    Assessment: 59 y.o. female presented 2/3 with dyspnea. Found to be in afib with RVR (pt with h/o PAF) - s/p TEE cardioversion 2/8 with return to NSR but now noted with recurent atrial arrhythmia. Xarelto started on 2/4 and discontinued 2/9. She is now on heparin with plans for cath today.  Heparin level= 0.53 and aPTT= 86. Heparin rate has been stable and last two heparin levels have been at goal.   Goal of Therapy:  Heparin level 0.3-0.7 units/ml Monitor platelets by anticoagulation protocol: Yes   Plan:  -No heparin changes  needed -Discontinue aPTT monitoring -Will follow Xarelto plans post cath   Hildred Laser, Pharm D 01/27/2015 12:50 PM

## 2015-01-27 NOTE — Progress Notes (Signed)
SUBJECTIVE: The patient is doing well today.  At this time, she denies chest pain, shortness of breath, or any new concerns.  Pending cath today - BMET reveals hypokalemia   CURRENT MEDICATIONS . aspirin  81 mg Oral Pre-Cath  . insulin aspart  0-5 Units Subcutaneous QHS  . insulin aspart  0-9 Units Subcutaneous TID WC  . levothyroxine  150 mcg Oral QAC breakfast  . multivitamin with minerals  1 tablet Oral Daily  . sodium chloride  3 mL Intravenous Q12H  . sodium chloride  3 mL Intravenous Q12H  . sodium chloride  3 mL Intravenous Q12H   . sodium chloride 1 mL/kg/hr (01/26/15 2113)  . amiodarone 30 mg/hr (01/26/15 2331)  . diltiazem (CARDIZEM) infusion 15 mg/hr (01/27/15 0449)  . heparin 1,300 Units/hr (01/26/15 2000)    OBJECTIVE: Physical Exam: Filed Vitals:   01/27/15 0200 01/27/15 0300 01/27/15 0400 01/27/15 0500  BP: 141/96 146/101 120/100 119/82  Pulse: 121 60 68 89  Temp:   98 F (36.7 C)   TempSrc:   Oral   Resp: 16 19 15 14   Height:      Weight:      SpO2: 94% 94% 95% 94%    Intake/Output Summary (Last 24 hours) at 01/27/15 [id number] Last data filed at 01/27/15 0500  Gross per 24 hour  Intake 1626.08 ml  Output   1450 ml  Net 176.08 ml    Telemetry reveals atrial fibrillation, ventricular rates 100-120's  GEN- The patient is overweight appearing, alert and oriented x 3 today.  Head- normocephalic, atraumatic Eyes- Sclera clear, conjunctiva pink Ears- hearing intact Oropharynx- clear Neck- supple, Lungs- Clear to ausculation bilaterally, normal work of breathing Heart- tachycardic irregular rhythm, no murmurs, rubs or gallops, PMI not laterally displaced GI- soft, NT, ND, + BS Extremities- no clubbing, cyanosis, or edema, PICC line in place MS- no significant deformity or atrophy Skin- no rash or lesion Psych- euthymic mood, full affect Neuro- strength and sensation are intact  LABS: Basic Metabolic Panel:  Recent Labs  01/26/15 0843  NA  139  K 3.6  CL 106  CO2 28  GLUCOSE 140*  BUN 24*  CREATININE 1.54*  CALCIUM 8.6   CBC:  Recent Labs  01/26/15 0843 01/27/15 0200  WBC 8.6 6.9  HGB 12.6 10.4*  HCT 39.7 32.2*  MCV 81.0 78.9  PLT 217 173    RADIOLOGY: Dg Chest Port 1 View 01/26/2015   CLINICAL DATA:  PICC line placement.  EXAM: PORTABLE CHEST - 1 VIEW  COMPARISON:  January 19, 2015.  FINDINGS: The heart size and mediastinal contours are within normal limits. Both lungs are clear. No pneumothorax or plural effusion is noted. Interval placement of right-sided PICC line with distal tip overlying expected position of cavoatrial junction. The visualized skeletal structures are unremarkable.  IMPRESSION: Interval placement of right-sided PICC line with distal tip overlying expected position of cavoatrial junction. No acute cardiopulmonary abnormality seen.   Electronically Signed   By: Marijo Conception, M.D.   On: 01/26/2015 22:34   ASSESSMENT AND PLAN:  Principal Problem:   Atrial flutter Active Problems:   Diabetes type 2, controlled   Benign essential HTN   Hypothyroid- TSH WNL   Obesity-BMI 45   Elevated troponin   CKD (chronic kidney disease), stage III   Demand ischemia   Moderate mitral regurgitation   1. afib Better rate controlled today Continue IV amiodarone and heparin drip Would anticipate converting heparin back to  xarelto post cath if OK with Dr Everlene Balls amiodarone to oral tomorrow am Dependant on results of cath, I would anticipate cardioversion likely tomorrow am  2. Prolonged QT- replete K and check mg Avoid qt prolonging meds  3. SOB Cath planned today  4. CRI Stable No change required today Pt has been hydrated overnight

## 2015-01-27 NOTE — H&P (View-Only) (Signed)
SUBJECTIVE: The patient is doing well today.  At this time, she denies chest pain, shortness of breath, or any new concerns.  Pending cath today - BMET reveals hypokalemia   CURRENT MEDICATIONS . aspirin  81 mg Oral Pre-Cath  . insulin aspart  0-5 Units Subcutaneous QHS  . insulin aspart  0-9 Units Subcutaneous TID WC  . levothyroxine  150 mcg Oral QAC breakfast  . multivitamin with minerals  1 tablet Oral Daily  . sodium chloride  3 mL Intravenous Q12H  . sodium chloride  3 mL Intravenous Q12H  . sodium chloride  3 mL Intravenous Q12H   . sodium chloride 1 mL/kg/hr (01/26/15 2113)  . amiodarone 30 mg/hr (01/26/15 2331)  . diltiazem (CARDIZEM) infusion 15 mg/hr (01/27/15 0449)  . heparin 1,300 Units/hr (01/26/15 2000)    OBJECTIVE: Physical Exam: Filed Vitals:   01/27/15 0200 01/27/15 0300 01/27/15 0400 01/27/15 0500  BP: 141/96 146/101 120/100 119/82  Pulse: 121 60 68 89  Temp:   98 F (36.7 C)   TempSrc:   Oral   Resp: 16 19 15 14   Height:      Weight:      SpO2: 94% 94% 95% 94%    Intake/Output Summary (Last 24 hours) at 01/27/15 [id number] Last data filed at 01/27/15 0500  Gross per 24 hour  Intake 1626.08 ml  Output   1450 ml  Net 176.08 ml    Telemetry reveals atrial fibrillation, ventricular rates 100-120's  GEN- The patient is overweight appearing, alert and oriented x 3 today.  Head- normocephalic, atraumatic Eyes- Sclera clear, conjunctiva pink Ears- hearing intact Oropharynx- clear Neck- supple, Lungs- Clear to ausculation bilaterally, normal work of breathing Heart- tachycardic irregular rhythm, no murmurs, rubs or gallops, PMI not laterally displaced GI- soft, NT, ND, + BS Extremities- no clubbing, cyanosis, or edema, PICC line in place MS- no significant deformity or atrophy Skin- no rash or lesion Psych- euthymic mood, full affect Neuro- strength and sensation are intact  LABS: Basic Metabolic Panel:  Recent Labs  01/26/15 0843  NA  139  K 3.6  CL 106  CO2 28  GLUCOSE 140*  BUN 24*  CREATININE 1.54*  CALCIUM 8.6   CBC:  Recent Labs  01/26/15 0843 01/27/15 0200  WBC 8.6 6.9  HGB 12.6 10.4*  HCT 39.7 32.2*  MCV 81.0 78.9  PLT 217 173    RADIOLOGY: Dg Chest Port 1 View 01/26/2015   CLINICAL DATA:  PICC line placement.  EXAM: PORTABLE CHEST - 1 VIEW  COMPARISON:  January 19, 2015.  FINDINGS: The heart size and mediastinal contours are within normal limits. Both lungs are clear. No pneumothorax or plural effusion is noted. Interval placement of right-sided PICC line with distal tip overlying expected position of cavoatrial junction. The visualized skeletal structures are unremarkable.  IMPRESSION: Interval placement of right-sided PICC line with distal tip overlying expected position of cavoatrial junction. No acute cardiopulmonary abnormality seen.   Electronically Signed   By: Marijo Conception, M.D.   On: 01/26/2015 22:34   ASSESSMENT AND PLAN:  Principal Problem:   Atrial flutter Active Problems:   Diabetes type 2, controlled   Benign essential HTN   Hypothyroid- TSH WNL   Obesity-BMI 45   Elevated troponin   CKD (chronic kidney disease), stage III   Demand ischemia   Moderate mitral regurgitation   1. afib Better rate controlled today Continue IV amiodarone and heparin drip Would anticipate converting heparin back to  xarelto post cath if OK with Dr Everlene Balls amiodarone to oral tomorrow am Dependant on results of cath, I would anticipate cardioversion likely tomorrow am  2. Prolonged QT- replete K and check mg Avoid qt prolonging meds  3. SOB Cath planned today  4. CRI Stable No change required today Pt has been hydrated overnight

## 2015-01-27 NOTE — Progress Notes (Signed)
ANTICOAGULATION CONSULT NOTE - Follow Up Consult  Pharmacy Consult for Heparin Indication: atrial fibrillation  No Known Allergies  Patient Measurements: Height: 5\' 6"  (167.6 cm) Weight: 270 lb 15.1 oz (122.9 kg) IBW/kg (Calculated) : 59.3 Heparin Dosing Weight: 89 kg  Vital Signs: Temp: 97.7 F (36.5 C) (02/10 1650) Temp Source: Oral (02/10 1650) BP: 121/94 mmHg (02/10 1803) Pulse Rate: 137 (02/10 1803)  Labs:  Recent Labs  01/26/15 0843 01/26/15 1827 01/27/15 0200 01/27/15 0600 01/27/15 0830 01/27/15 1030 01/27/15 1300  HGB 12.6  --  10.4*  --   --   --   --   HCT 39.7  --  32.2*  --   --   --   --   PLT 217  --  173  --   --   --   --   APTT  --  34 82*  --  86*  --   --   HEPARINUNFRC  --  0.76* 0.58  --   --  0.53  --   CREATININE 1.54*  --   --  1.08  --   --  1.17*    Estimated Creatinine Clearance: 71.8 mL/min (by C-G formula based on Cr of 1.17).  Assessment:  s/p cardiac cath. TR band deflation expected to be completed by ~7pm.  Heparin to resume 3 hours later.  Goal of Therapy:  Heparin level 0.3-0.7 units/ml Monitor platelets by anticoagulation protocol: Yes   Plan:   Resume heparin at 1300 units/hr at ~10pm tonight.  Heparin level and CBC in am.  Will follow up for resuming Xarelto.  Arty Baumgartner, Charles [place] Pager: [phone] 01/27/2015,6:47 PM

## 2015-01-27 NOTE — Progress Notes (Signed)
CRITICAL VALUE ALERT  Critical value received:  Potassium 2.7  Date of notification:  01/27/15  Time of notification:  0705  Critical value read back: yes  Nurse who received alert:  Henrietta Dine, RN  MD notified (1st page):  McClung  Time of first page:  0705  Responding MD: Thereasa Solo  Time MD responded:  [phone]

## 2015-01-27 NOTE — Interval H&P Note (Signed)
Cath Lab Visit (complete for each Cath Lab visit)  Clinical Evaluation Leading to the Procedure:   ACS: Yes.    Non-ACS:    Anginal Classification: CCS III  Anti-ischemic medical therapy: Minimal Therapy (1 class of medications)  Non-Invasive Test Results: No non-invasive testing performed  Prior CABG: No previous CABG      History and Physical Interval Note:  01/27/2015 3:35 PM  Isabella Hall  has presented today for surgery, with the diagnosis of cp  The various methods of treatment have been discussed with the patient and family. After consideration of risks, benefits and other options for treatment, the patient has consented to  Procedure(s): LEFT HEART CATHETERIZATION WITH CORONARY ANGIOGRAM (N/A) as a surgical intervention .  The patient's history has been reviewed, patient examined, no change in status, stable for surgery.  I have reviewed the patient's chart and labs.  Questions were answered to the patient's satisfaction.     Sinclair Grooms

## 2015-01-27 NOTE — Progress Notes (Signed)
ANTICOAGULATION CONSULT NOTE - Initial Consult  Pharmacy Consult for Heparin Indication: atrial fibrillation  No Known Allergies  Patient Measurements: Height: 5\' 6"  (167.6 cm) Weight: 270 lb 15.1 oz (122.9 kg) IBW/kg (Calculated) : 59.3 Heparin Dosing Weight: 89 kg  Vital Signs: Temp: 98.2 F (36.8 C) (02/10 0000) Temp Source: Oral (02/10 0000) BP: 141/96 mmHg (02/10 0200) Pulse Rate: 121 (02/10 0200)  Labs:  Recent Labs  01/26/15 0843 01/26/15 1827 01/27/15 0200  HGB 12.6  --  10.4*  HCT 39.7  --  32.2*  PLT 217  --  173  APTT  --  34 82*  HEPARINUNFRC  --  0.76* 0.58  CREATININE 1.54*  --   --     Estimated Creatinine Clearance: 54.5 mL/min (by C-G formula based on Cr of 1.54).   Medical History: Past Medical History  Diagnosis Date  . Essential hypertension   . Hypothyroidism   . Atrial fibrillation      CHADSVASC score 4  . Obesity   . Atrial flutter   . Type 2 diabetes mellitus     Medications:  Prescriptions prior to admission  Medication Sig Dispense Refill Last Dose  . aspirin EC 81 MG tablet Take 81 mg by mouth daily.   01/19/2015 at Unknown time  . diltiazem (CARDIZEM) 120 MG tablet Take 1 tablet (120 mg total) by mouth 2 (two) times daily. 60 tablet 11 01/16/2015  . levothyroxine (SYNTHROID) 150 MCG tablet Take 1 tablet (150 mcg total) by mouth daily. 30 tablet 11 01/19/2015 at Unknown time  . Multiple Vitamin (MULTIVITAMIN WITH MINERALS) TABS tablet Take 1 tablet by mouth daily.   01/19/2015 at Unknown time  . GARLIC PO Take 1 tablet by mouth daily.   11/08/2012  . sodium chloride (OCEAN) 0.65 % nasal spray Place 1 spray into both nostrils 3 (three) times daily as needed. Uses daily, up to three times per day during allergy season   11/08/2012    Assessment: 59 y.o. female presented 2/3 with dyspnea. Found to be in afib with RVR (pt with h/o PAF) - s/p TEE cardioversion 2/8 with return to NSR. Developed recurent atrial arrhythmia overnight after  amio gtt stopped. Xarelto started on 2/4 - last 20mg  dose 2/8 0900. Xarelto was held and now on heparin gtt at 1300 units/hr as pt needs cath to exclude any ischemia. RN reports no s/s o bleeding   Goal of Therapy:  Heparin level 0.3-0.7 units/ml; aPTT 66-102 sec Monitor platelets by anticoagulation protocol: Yes   Plan:  1. Continue heparin infusion at 1300 units/hr 2. F/u 6 hr HL  3. Daily heparin level and aPTT. Likely will only need one more heparin level/aPTT in AM prior to cath.   Albertina Parr, PharmD., BCPS Clinical Pharmacist Pager [phone]

## 2015-01-28 DIAGNOSIS — I428 Other cardiomyopathies: Secondary | ICD-10-CM | POA: Insufficient documentation

## 2015-01-28 DIAGNOSIS — I4891 Unspecified atrial fibrillation: Secondary | ICD-10-CM

## 2015-01-28 LAB — BASIC METABOLIC PANEL
Anion gap: 4 — ABNORMAL LOW (ref 5–15)
Anion gap: 10 (ref 5–15)
BUN: 16 mg/dL (ref 6–23)
BUN: 17 mg/dL (ref 6–23)
CHLORIDE: 107 mmol/L (ref 96–112)
CO2: 28 mmol/L (ref 19–32)
CO2: 25 mmol/L (ref 19–32)
CREATININE: 1.41 mg/dL — AB (ref 0.50–1.10)
Calcium: 8.4 mg/dL (ref 8.4–10.5)
Calcium: 8.6 mg/dL (ref 8.4–10.5)
Chloride: 104 mmol/L (ref 96–112)
Creatinine, Ser: 1.56 mg/dL — ABNORMAL HIGH (ref 0.50–1.10)
GFR calc Af Amer: 42 mL/min — ABNORMAL LOW (ref >90)
GFR calc non Af Amer: 41 mL/min — ABNORMAL LOW (ref >90)
GFR calc non Af Amer: 36 mL/min — ABNORMAL LOW (ref >90)
GFR, EST AFRICAN AMERICAN: 47 mL/min — AB (ref >90)
GLUCOSE: 190 mg/dL — AB (ref 70–99)
Glucose, Bld: 163 mg/dL — ABNORMAL HIGH (ref 70–99)
POTASSIUM: 3.7 mmol/L (ref 3.5–5.1)
POTASSIUM: 3.8 mmol/L (ref 3.5–5.1)
SODIUM: 139 mmol/L (ref 135–145)
SODIUM: 139 mmol/L (ref 135–145)

## 2015-01-28 LAB — CBC
HEMATOCRIT: 34.2 % — AB (ref 36.0–46.0)
Hemoglobin: 10.8 g/dL — ABNORMAL LOW (ref 12.0–15.0)
MCH: 25 pg — ABNORMAL LOW (ref 26.0–34.0)
MCHC: 31.6 g/dL (ref 30.0–36.0)
MCV: 79.2 fL (ref 78.0–100.0)
Platelets: 185 10*3/uL (ref 150–400)
RBC: 4.32 MIL/uL (ref 3.87–5.11)
RDW: 16 % — ABNORMAL HIGH (ref 11.5–15.5)
WBC: 6 10*3/uL (ref 4.0–10.5)

## 2015-01-28 LAB — MAGNESIUM: Magnesium: 2 mg/dL (ref 1.5–2.5)

## 2015-01-28 LAB — GLUCOSE, CAPILLARY
GLUCOSE-CAPILLARY: 126 mg/dL — AB (ref 70–99)
GLUCOSE-CAPILLARY: 132 mg/dL — AB (ref 70–99)
GLUCOSE-CAPILLARY: 131 mg/dL — AB (ref 70–99)
Glucose-Capillary: 88 mg/dL (ref 70–99)

## 2015-01-28 LAB — HEPARIN LEVEL (UNFRACTIONATED): HEPARIN UNFRACTIONATED: 0.38 [IU]/mL (ref 0.30–0.70)

## 2015-01-28 MED ORDER — AMIODARONE HCL 200 MG PO TABS
200.0000 mg | ORAL_TABLET | Freq: Two times a day (BID) | ORAL | Status: DC
  Administered 2015-01-28 – 2015-01-29 (×3): 200 mg via ORAL
  Filled 2015-01-28 (×4): qty 1

## 2015-01-28 MED ORDER — RIVAROXABAN 20 MG PO TABS
20.0000 mg | ORAL_TABLET | Freq: Every day | ORAL | Status: DC
Start: 2015-01-28 — End: 2015-01-29
  Administered 2015-01-28: 20 mg via ORAL
  Filled 2015-01-28 (×2): qty 1

## 2015-01-28 MED ORDER — CARVEDILOL 6.25 MG PO TABS
6.2500 mg | ORAL_TABLET | Freq: Two times a day (BID) | ORAL | Status: DC
  Administered 2015-01-28 – 2015-01-29 (×3): 6.25 mg via ORAL
  Filled 2015-01-28 (×5): qty 1

## 2015-01-28 NOTE — Progress Notes (Signed)
ANTICOAGULATION CONSULT NOTE - Follow Up Consult  Pharmacy Consult for Heparin Indication: atrial fibrillation  No Known Allergies  Patient Measurements: Height: 5\' 6"  (167.6 cm) Weight: 276 lb 3.8 oz (125.3 kg) IBW/kg (Calculated) : 59.3 Heparin Dosing Weight: 89 kg  Vital Signs: Temp: 98 F (36.7 C) (02/11 1147) Temp Source: Oral (02/11 1147) BP: 133/82 mmHg (02/11 1147) Pulse Rate: 68 (02/11 1147)  Labs:  Recent Labs  01/26/15 0843  01/26/15 1827 01/27/15 0200  01/27/15 0830 01/27/15 1030 01/27/15 1300 01/28/15 0440 01/28/15 0500 01/28/15 0900  HGB 12.6  --   --  10.4*  --   --   --   --  10.8*  --   --   HCT 39.7  --   --  32.2*  --   --   --   --  34.2*  --   --   PLT 217  --   --  173  --   --   --   --  185  --   --   APTT  --   --  34 82*  --  86*  --   --   --   --   --   HEPARINUNFRC  --   < > 0.76* 0.58  --   --  0.53  --   --  0.38  --   CREATININE 1.54*  --   --   --   < >  --   --  1.17* 1.41*  --  1.56*  < > = values in this interval not displayed.  Estimated Creatinine Clearance: 54.5 mL/min (by C-G formula based on Cr of 1.56).  Assessment: Heparin drip restarted 1300 uts/hr HL at goal 0.38.  CBC stable no bleeding noted.  Plan to stop heparin tonight and restart home rivaroxaban.    Goal of Therapy:  Heparin level 0.3-0.7 units/ml Monitor platelets by anticoagulation protocol: Yes   Plan:  Stop heparin drip at 1700 tonight Begin rivaroxaban 20mg   q1800  Bonnita Nasuti Pharm.D. CPP, BCPS Clinical Pharmacist [phone] 01/28/2015 2:54 PM

## 2015-01-28 NOTE — Progress Notes (Signed)
Transferred patient to [phone] with all medications and personal belongings. Report given to [facility].

## 2015-01-28 NOTE — Progress Notes (Addendum)
SUBJECTIVE: The patient is doing well today.  At this time, she denies chest pain, shortness of breath, or any new concerns.  Cath yesterday showed normal coronaries with diastolic heart failure with mildly elevated LV filling pressures.  Converted to SR yesterday evening.     CURRENT MEDICATIONS . insulin aspart  0-5 Units Subcutaneous QHS  . insulin aspart  0-9 Units Subcutaneous TID WC  . levothyroxine  150 mcg Oral QAC breakfast  . multivitamin with minerals  1 tablet Oral Daily  . potassium chloride  40 mEq Oral Daily   . amiodarone 30 mg/hr (01/27/15 2202)  . diltiazem (CARDIZEM) infusion 10 mg/hr (01/27/15 2203)  . heparin 1,300 Units/hr (01/27/15 2202)    OBJECTIVE: Physical Exam: Filed Vitals:   01/28/15 0200 01/28/15 0300 01/28/15 0437 01/28/15 0446  BP: 136/79 139/70 134/68   Pulse: 59 64 63   Temp:   98.9 F (37.2 C)   TempSrc:   Oral   Resp:   22   Height:      Weight:    276 lb 3.8 oz (125.3 kg)  SpO2: 94% 96% 98%     Intake/Output Summary (Last 24 hours) at 01/28/15 0725 Last data filed at 01/28/15 0500  Gross per 24 hour  Intake 2742.38 ml  Output   1551 ml  Net 1191.38 ml    Telemetry reveals conversion to SR yesterday around 7PM  GEN- The patient is overweight appearing, alert and oriented x 3 today.  Head- normocephalic, atraumatic Eyes- Sclera clear, conjunctiva pink Ears- hearing intact Oropharynx- clear Neck- supple, Lungs- Clear to ausculation bilaterally, normal work of breathing Heart- RRR,, no murmurs, rubs or gallops, PMI not laterally displaced GI- soft, NT, ND, + BS Extremities- no clubbing, cyanosis, or edema, PICC line in place MS- no significant deformity or atrophy Skin- no rash or lesion Psych- euthymic mood, full affect Neuro- strength and sensation are intact  LABS: Basic Metabolic Panel:  Recent Labs  01/27/15 0730 01/27/15 1300 01/28/15 0440  NA  --  140 139  K  --  4.4 3.7  CL  --  108 107  CO2  --   22 28  GLUCOSE  --  189* 163*  BUN  --  12 16  CREATININE  --  1.17* 1.41*  CALCIUM  --  7.7* 8.4  MG 1.8  --  2.0   CBC:  Recent Labs  01/27/15 0200 01/28/15 0440  WBC 6.9 6.0  HGB 10.4* 10.8*  HCT 32.2* 34.2*  MCV 78.9 79.2  PLT 173 185    RADIOLOGY: Dg Chest Port 1 View 01/26/2015   CLINICAL DATA:  PICC line placement.  EXAM: PORTABLE CHEST - 1 VIEW  COMPARISON:  January 19, 2015.  FINDINGS: The heart size and mediastinal contours are within normal limits. Both lungs are clear. No pneumothorax or plural effusion is noted. Interval placement of right-sided PICC line with distal tip overlying expected position of cavoatrial junction. The visualized skeletal structures are unremarkable.  IMPRESSION: Interval placement of right-sided PICC line with distal tip overlying expected position of cavoatrial junction. No acute cardiopulmonary abnormality seen.   Electronically Signed   By: Marijo Conception, M.D.   On: 01/26/2015 22:34   ASSESSMENT AND PLAN:  Principal Problem:   Atrial flutter Active Problems:   Diabetes type 2, controlled   Benign essential HTN   Hypothyroid- TSH WNL   Obesity-BMI 45   Elevated troponin   CKD (chronic kidney disease), stage III  Demand ischemia   Moderate mitral regurgitation   1. afib In sinus rhythm today Convert amiodarone and diltiazem to PO amio and coreg Resume xarelto this evening.  Stop heparin drip after initial dose of xarelto is given  2. Prolonged QT- replete K and check mg Avoid qt prolonging meds  3. SOB Cath reviewed SOB is improved with sinus rhythm  4. CRI Stable No change required today bmet today is pending  5. Nonischemic CM Likely rate related Add coreg today Add lisinopril if creatinine remains stable  Transfer to telemetry Hopefully discharge tomorrow

## 2015-01-29 DIAGNOSIS — E119 Type 2 diabetes mellitus without complications: Secondary | ICD-10-CM

## 2015-01-29 LAB — BASIC METABOLIC PANEL
Anion gap: 6 (ref 5–15)
BUN: 21 mg/dL (ref 6–23)
CALCIUM: 8.5 mg/dL (ref 8.4–10.5)
CO2: 25 mmol/L (ref 19–32)
CREATININE: 1.64 mg/dL — AB (ref 0.50–1.10)
Chloride: 108 mmol/L (ref 96–112)
GFR, EST AFRICAN AMERICAN: 39 mL/min — AB (ref >90)
GFR, EST NON AFRICAN AMERICAN: 34 mL/min — AB (ref >90)
GLUCOSE: 90 mg/dL (ref 70–99)
POTASSIUM: 4.1 mmol/L (ref 3.5–5.1)
Sodium: 139 mmol/L (ref 135–145)

## 2015-01-29 LAB — GLUCOSE, CAPILLARY
GLUCOSE-CAPILLARY: 96 mg/dL (ref 70–99)
Glucose-Capillary: 144 mg/dL — ABNORMAL HIGH (ref 70–99)

## 2015-01-29 MED ORDER — AMIODARONE HCL 200 MG PO TABS: ORAL_TABLET | ORAL | Status: DC

## 2015-01-29 MED ORDER — POTASSIUM CHLORIDE CRYS ER 20 MEQ PO TBCR: 40.0000 meq | EXTENDED_RELEASE_TABLET | Freq: Every day | ORAL | Status: DC

## 2015-01-29 MED ORDER — CARVEDILOL 6.25 MG PO TABS: 6.2500 mg | ORAL_TABLET | Freq: Two times a day (BID) | ORAL | Status: DC

## 2015-01-29 MED ORDER — RIVAROXABAN 20 MG PO TABS: 20.0000 mg | ORAL_TABLET | Freq: Every day | ORAL | Status: DC

## 2015-01-29 NOTE — Progress Notes (Signed)
SUBJECTIVE: The patient is doing well today.  At this time, she denies chest pain, shortness of breath, or any new concerns.  Maintaining sinus rhythm No complaints today.  CURRENT MEDICATIONS . amiodarone  200 mg Oral BID  . carvedilol  6.25 mg Oral BID WC  . insulin aspart  0-5 Units Subcutaneous QHS  . insulin aspart  0-9 Units Subcutaneous TID WC  . levothyroxine  150 mcg Oral QAC breakfast  . multivitamin with minerals  1 tablet Oral Daily  . potassium chloride  40 mEq Oral Daily  . rivaroxaban  20 mg Oral Q supper      OBJECTIVE: Physical Exam: Filed Vitals:   01/29/15 0445 01/29/15 0447 01/29/15 0620 01/29/15 0850  BP:   113/73 119/72  Pulse:   67 69  Temp:   97.8 F (36.6 C)   TempSrc:   Oral   Resp:   18   Height:      Weight: 275 lb 4.8 oz (124.875 kg) 274 lb 1.6 oz (124.331 kg)    SpO2:   100% 99%    Intake/Output Summary (Last 24 hours) at 01/29/15 1142 Last data filed at 01/29/15 0915  Gross per 24 hour  Intake    565 ml  Output   2125 ml  Net  -1560 ml    Telemetry reveals SR  GEN- The patient is overweight appearing, alert and oriented x 3 today.  Head- normocephalic, atraumatic Eyes- Sclera clear, conjunctiva pink Ears- hearing intact Oropharynx- clear Neck- supple, Lungs- Clear to ausculation bilaterally, normal work of breathing Heart- RRR,, no murmurs, rubs or gallops, PMI not laterally displaced GI- soft, NT, ND, + BS Extremities- no clubbing, cyanosis, or edema, PICC line in place MS- no significant deformity or atrophy Skin- no rash or lesion Psych- euthymic mood, full affect Neuro- strength and sensation are intact  LABS: Basic Metabolic Panel:  Recent Labs  01/27/15 0730  01/28/15 0440 01/28/15 0900 01/29/15 0525  NA  --   < > 139 139 139  K  --   < > 3.7 3.8 4.1  CL  --   < > 107 104 108  CO2  --   < > 28 25 25   GLUCOSE  --   < > 163* 190* 90  BUN  --   < > 16 17 21   CREATININE  --   < > 1.41* 1.56* 1.64*    CALCIUM  --   < > 8.4 8.6 8.5  MG 1.8  --  2.0  --   --   < > = values in this interval not displayed. CBC:  Recent Labs  01/27/15 0200 01/28/15 0440  WBC 6.9 6.0  HGB 10.4* 10.8*  HCT 32.2* 34.2*  MCV 78.9 79.2  PLT 173 185    RADIOLOGY: Dg Chest Port 1 View 01/26/2015   CLINICAL DATA:  PICC line placement.  EXAM: PORTABLE CHEST - 1 VIEW  COMPARISON:  January 19, 2015.  FINDINGS: The heart size and mediastinal contours are within normal limits. Both lungs are clear. No pneumothorax or plural effusion is noted. Interval placement of right-sided PICC line with distal tip overlying expected position of cavoatrial junction. The visualized skeletal structures are unremarkable.  IMPRESSION: Interval placement of right-sided PICC line with distal tip overlying expected position of cavoatrial junction. No acute cardiopulmonary abnormality seen.   Electronically Signed   By: Marijo Conception, M.D.   On: 01/26/2015 22:34   ASSESSMENT AND PLAN:  Principal Problem:   Atrial flutter Active Problems:   Diabetes type 2, controlled   Benign essential HTN   Hypothyroid- TSH WNL   Obesity-BMI 45   Elevated troponin   CKD (chronic kidney disease), stage III   Demand ischemia   Moderate mitral regurgitation   Nonischemic cardiomyopathy   1. afib In sinus rhythm today Amiodarone 200mg  BID x 2 weeks then 200mg  daily xarelto 20mg  daily Continue current coreg dosing  2. Prolonged QT  Avoid qt prolonging meds  3. SOB SOB is improved with sinus rhythm  4. CRI Stable No change required today Will need repeat bmet next week  5. Nonischemic CM Likely rate related Continue coreg Cannot add ace inhibitor presently due to renal failure.  Will need to reassess as an outpatient.  DC to home Follow-up with Dr Woodroe Chen in 1-2 weeks  Weight loss advised Will need sleep study as an outpatient

## 2015-01-29 NOTE — Discharge Summary (Signed)
ELECTROPHYSIOLOGY PROCEDURE DISCHARGE SUMMARY    Patient ID: Isabella Hall,  MRN: [id number], DOB/AGE: 05/29/1956 59 y.o.  Admit date: 01/19/2015 Discharge date: 01/29/2015  Primary Care Physician: Robert Bellow, MD Primary Cardiologist: [name] Electrophysiologist: Lovena Le  Primary Discharge Diagnosis:  Atrial fibrillation   Secondary Discharge Diagnosis:  1.  Hypertension 2.  Hypothyroidism 3.  Obesity 4.  Diabetes  No Known Allergies   Procedures This Admission:  1.  Cardiac catheterization on 01-27-15 demonstrated normal coronary arteries with mildly elevated LV filling pressures and diastolic heart failure.   Brief HPIHospital Course:  Isabella Hall is a 59 y.o. female with a past medical history significant for hypertension, hypothyroidism, obesity, diabetes, and persistent atrial fibrillation. She was first diagnosed with atrial fibrillation in 2013 at which time she presented to the hospital in AF with RVR and converted with Diltiazem. She has had no recurrent palpitations and did well a week before admission when she developed palpitations and shortness of breath. She called EMS on the day of admission because of worsening shortness of breath and was found to be in AF with RVR. She was taken to [facility] and was initially treated with medical therapy but had persistent atrial fibrillation. She was placed on amiodarone and underwent TEE/DCCV 01-25-15 at which time amiodarone was discontinued. She had recurrent AF with RVR and was transferred to [facility] for further evaluation and cardiac catheterization. Echocardiogram demonstrated EF 50-55%, moderate MR, LA 45.  Cardiac catheterization demonstrated normal coronaries with diastolic dysfunction.  She converted to SR on amiodarone prior to repeat cardioversion.   Her creatinine at discharge is 1.64 prohibiting addition of ACE-I at this time.  This will be reassessed in 1 week at visit with Jory Sims, NP.    She will be discharged home on Amiodarone 200mg  twice daily for 2 weeks then 200mg  dailly, Coreg, and Xarelto 20mg  1 tablet daily.  She will need follow up echocardiogram in 3 months to reassess EF.  She will be seen by Roderic Palau, NP in the AF clinic in 4 weeks.  She has been advised to follow up with her PCP about arranging outpatient sleep study evaluation.    Discharge Vitals: Blood pressure 119/72, pulse 69, temperature 97.8 F (36.6 C), temperature source Oral, resp. rate 18, height 5\' 6"  (1.676 m), weight 274 lb 1.6 oz (124.331 kg), SpO2 99 %.  Physical Exam: Filed Vitals:   01/29/15 0445 01/29/15 0447 01/29/15 0620 01/29/15 0850  BP:   113/73 119/72  Pulse:   67 69  Temp:   97.8 F (36.6 C)   TempSrc:   Oral   Resp:   18   Height:      Weight: 275 lb 4.8 oz (124.875 kg) 274 lb 1.6 oz (124.331 kg)    SpO2:   100% 99%    GEN- The patient is obese appearing, alert and oriented x 3 today.   Head- normocephalic, atraumatic Eyes-  Sclera clear, conjunctiva pink Ears- hearing intact Oropharynx- clear Neck- supple, Lungs- Clear to ausculation bilaterally, normal work of breathing Heart- Regular rate and rhythm, no murmurs, rubs or gallops, PMI not laterally displaced GI- soft, NT, ND, + BS Extremities- no clubbing, cyanosis, or edema  MS- no significant deformity or atrophy Skin- no rash or lesion Psych- euthymic mood, full affect Neuro- strength and sensation are intact    Labs:   Lab Results  Component Value Date   WBC 6.0 01/28/2015   HGB 10.8* 01/28/2015  HCT 34.2* 01/28/2015   MCV 79.2 01/28/2015   PLT 185 01/28/2015     Recent Labs Lab 01/29/15 0525  NA 139  K 4.1  CL 108  CO2 25  BUN 21  CREATININE 1.64*  CALCIUM 8.5  GLUCOSE 90     Discharge Medications:    Medication List    STOP taking these medications        aspirin EC 81 MG tablet     diltiazem 120 MG tablet  Commonly known as:  CARDIZEM      TAKE these medications         amiodarone 200 MG tablet  Commonly known as:  PACERONE  1 tab PO BID x 2 wks and then reduce to 1 tab PO Daily beginning on 02/13/2015.     carvedilol 6.25 MG tablet  Commonly known as:  COREG  Take 1 tablet (6.25 mg total) by mouth 2 (two) times daily with a meal.     GARLIC PO  Take 1 tablet by mouth daily.     levothyroxine 150 MCG tablet  Commonly known as:  SYNTHROID  Take 1 tablet (150 mcg total) by mouth daily.     multivitamin with minerals Tabs tablet  Take 1 tablet by mouth daily.     potassium chloride SA 20 MEQ tablet  Commonly known as:  K-DUR,KLOR-CON  Take 2 tablets (40 mEq total) by mouth daily.     rivaroxaban 20 MG Tabs tablet  Commonly known as:  XARELTO  Take 1 tablet (20 mg total) by mouth daily with supper.     sodium chloride 0.65 % nasal spray  Commonly known as:  OCEAN  Place 1 spray into both nostrils 3 (three) times daily as needed. Uses daily, up to three times per day during allergy season        Disposition:   Follow-up Information    Follow up with Thompson Grayer, MD On 03/03/2015.   Specialty:  Cardiology   Why:  9:45 AM   Contact information:   [place] [address] [phone]       Follow up with Jory Sims, NP On 02/05/2015.   Specialty:  Nurse Practitioner   Why:  2:30 PM   Contact information:   [place] [address] [phone]       Duration of Discharge Encounter: Greater than 30 minutes including physician time.  Signed,  Thompson Grayer MD

## 2015-02-05 ENCOUNTER — Ambulatory Visit: Payer: 59 | Admitting: Adult Health

## 2015-02-08 ENCOUNTER — Ambulatory Visit (INDEPENDENT_AMBULATORY_CARE_PROVIDER_SITE_OTHER): Payer: 59 | Admitting: Adult Health

## 2015-02-08 ENCOUNTER — Encounter: Payer: Self-pay | Admitting: Adult Health

## 2015-02-08 VITALS — BP 130/92 | HR 62 | Ht 66.0 in | Wt 274.0 lb

## 2015-02-08 DIAGNOSIS — I4891 Unspecified atrial fibrillation: Secondary | ICD-10-CM

## 2015-02-08 DIAGNOSIS — I5031 Acute diastolic (congestive) heart failure: Secondary | ICD-10-CM

## 2015-02-08 DIAGNOSIS — Z136 Encounter for screening for cardiovascular disorders: Secondary | ICD-10-CM

## 2015-02-08 DIAGNOSIS — I10 Essential (primary) hypertension: Secondary | ICD-10-CM

## 2015-02-08 LAB — GLUCOSE, CAPILLARY
GLUCOSE-CAPILLARY: 114 mg/dL — AB (ref 70–99)
Glucose-Capillary: 152 mg/dL — ABNORMAL HIGH (ref 70–99)

## 2015-02-08 NOTE — Patient Instructions (Signed)
Your physician recommends that you schedule a follow-up appointment in:  After visit with Allred   Your physician recommends that you continue on your current medications as directed. Please refer to the Current Medication list given to you today.   Your physician recommends that you return for lab work in ( BMET , Potassium)  Thank you for choosing [facility]!

## 2015-02-08 NOTE — Assessment & Plan Note (Signed)
No evidence of fluid overload at this time.  She is no longer taking Lasix, but is continuing to take potassium.  I have asked her to hold the potassium was held BMET is drawn.

## 2015-02-08 NOTE — Progress Notes (Signed)
Cardiology Office Note   Date:  02/08/2015   ID:  Isabella Hall, Isabella Hall 02-04-1956, MRN [id number]  PCP:  Robert Bellow, MD  Cardiologist: Woodroe Chen, NP   Chief Complaint  Patient presents with  . Atrial Fibrillation  . Hypertension      History of Present Illness: Isabella Hall is a 59 y.o. female who presents for post hospitalization for chest pain, and atrial fibrillation.  Other history includes hypertension, hypothyroidism, obesity, diabetes,.  She was admitted to the hospital with A. Fib with RVR and was converted to diltiazem.  Prior to hospitalization at [facility], she was seen at any 10.  In ER, where she was placed on amiodarone and when a TEE/DCCV on 02/04/2015, amiodarone was discontinued.Cardiac catheterization revealed  normal coronary arteries with diastolic dysfunction.  Due to recurrence of atrial fibrillation, she was placed on amiodarone.  On discharge, she was sent home on amiodarone 200 mg twice a day for 2 weeks and then to convert to 200 mg daily on 02/13/2015.Marland Kitchen She was continued on Xarelto.  She is to be established in atrial fibrillation, clinic with Laroy Apple, NP. She has not yet been seen by her.  Today feeling very well.  She will be changing over to daily dose of amiodarone on 12/14/2015. She is due to see Dr. [name] on March 26 2 schedule ablation if necessary.she is tolerating all of her medications without side effects.  She remains on Xarelto 20 mg daily.  No bleeding issues, but she does have a good bit of bruising on her upper extremities.  She is pleased with her progress and has not felt her heart rate, irregular.  She wants to restart her exercise program, as she has lost 150 pounds and wants to go back to her healthy lifestyle. She has not done so until cleared by [place].    Past Medical History  Diagnosis Date  . Essential hypertension   . Hypothyroidism   . Atrial fibrillation      CHADSVASC score 4  . Obesity   . Atrial  flutter   . Type 2 diabetes mellitus     Past Surgical History  Procedure Laterality Date  . Cholecystectomy  1979  . Tubal ligation  1980  . Tee without cardioversion N/A 01/25/2015    Procedure: TRANSESOPHAGEAL ECHOCARDIOGRAM (TEE);  Surgeon: Satira Sark, MD;  Location: [facility] OR[facility];  Service: Cardiovascular;  Laterality: N/A;  . Cardioversion N/A 01/25/2015    Procedure: CARDIOVERSION;  Surgeon: Satira Sark, MD;  Location: [facility];  Service: Cardiovascular;  Laterality: N/A;  . Left heart catheterization with coronary angiogram N/A 01/27/2015    Procedure: LEFT HEART CATHETERIZATION WITH CORONARY ANGIOGRAM;  Surgeon: Sinclair Grooms, MD;  Location: [facility] CATH LAB;  Service: Cardiovascular;  Laterality: N/A;     Current Outpatient Prescriptions  Medication Sig Dispense Refill  . amiodarone (PACERONE) 200 MG tablet 1 tab PO BID x 2 wks and then reduce to 1 tab PO Daily beginning on 02/13/2015. 60 tablet 6  . carvedilol (COREG) 6.25 MG tablet Take 1 tablet (6.25 mg total) by mouth 2 (two) times daily with a meal. 60 tablet 6  . GARLIC PO Take 1 tablet by mouth daily.    Marland Kitchen levothyroxine (SYNTHROID) 150 MCG tablet Take 1 tablet (150 mcg total) by mouth daily. 30 tablet 11  . Multiple Vitamin (MULTIVITAMIN WITH MINERALS) TABS tablet Take 1 tablet by mouth daily.    . potassium chloride SA (  K-DUR,KLOR-CON) 20 MEQ tablet Take 2 tablets (40 mEq total) by mouth daily. 60 tablet 6  . rivaroxaban (XARELTO) 20 MG TABS tablet Take 1 tablet (20 mg total) by mouth daily with supper. 30 tablet 6  . sodium chloride (OCEAN) 0.65 % nasal spray Place 1 spray into both nostrils 3 (three) times daily as needed. Uses daily, up to three times per day during allergy season     No current facility-administered medications for this visit.    Allergies:   Review of patient's allergies indicates no known allergies.    Social History:  The patient  reports that she has never smoked. She has never used smokeless  tobacco. She reports that she does not drink alcohol or use illicit drugs.   Family History:  The patient's family history includes Cancer in her maternal grandmother and mother; Coronary artery disease in her sister; Heart disease in her father; Hypertension in an other family member. There is no history of Atrial fibrillation or Diabetes.    ROS:  Please see the history of present illness.    All other systems are reviewed and negative.    PHYSICAL EXAM: VS:  BP 130/92 mmHg  Pulse 62  Ht 5\' 6"  (1.676 m)  Wt 274 lb (124.286 kg)  BMI 44.25 kg/m2  SpO2 99% , BMI Body mass index is 44.25 kg/(m^2). GEN: Well nourished, well developed, in no acute distress HEENT: normal Neck: no JVD, carotid bruits, or masses Cardiac: RRR; no murmurs, rubs, or gallops,no edema  Respiratory:  clear to auscultation bilaterally, normal work of breathing GI: soft, nontender, nondistended, + BS MS: no deformity or atrophy Skin: warm and dry, no rash Neuro:  Strength and sensation are intact Psych: euthymic mood, full affect   EKG:  EKG ordered today. The ekg ordered today demonstrates normal sinus rhythm, rate of 58 beats per minute.  PR interval 0.148 milliseconds, QT 0.468 ms, QTC 0.4 65 ms.   Recent Labs: 01/20/2015: B Natriuretic Peptide 496.0*; TSH 1.323 01/28/2015: Hemoglobin 10.8*; Magnesium 2.0; Platelets 185 01/29/2015: BUN 21; Creatinine 1.64*; Potassium 4.1; Sodium 139    Lipid Panel    Component Value Date/Time   CHOL 147 01/21/2015 0852   TRIG 61 01/21/2015 0852   HDL 45 01/21/2015 0852   CHOLHDL 3.3 01/21/2015 0852   VLDL 12 01/21/2015 0852   LDLCALC 90 01/21/2015 0852      Wt Readings from Last 3 Encounters:  02/08/15 274 lb (124.286 kg)  01/29/15 274 lb 1.6 oz (124.331 kg)  11/09/12 290 lb 5.5 oz (131.7 kg)      Other studies Reviewed: Additional studies/ records that were reviewed today include: Labs Review of the above records demonstrates: Normal potassium of  4.1   ASSESSMENT AND PLAN:  1. Atrial fibrillation: Remains in normal sinus rhythm on amiodarone, carvedilol, and is tolerating anticoagulation.  She is due to decrease her amiodarone from 200 mg twice a day to 20 mg daily on 02/13/2015.  She denies any fatigue, recurrent, palpitations, or bleeding issues.  She is encouraged to continue her medication regimen, she will see Dr. [name] on 03/13/2015.  We will see her after that appointment for ongoing assessment and management.She wishes to return to her normal exercise regimen.I think this will be okay since she has been on amiodarone loading dose, and was subsequently decreased the dose in approximately 5 days.  She is to start out slow, to evaluate how her heart rate response to exercise.  2. Hypertension: Excellent control  of blood pressure.  Will not make any changes at this time.  I will check a BMET to evaluate potassium status and she was hypokalemic during hospitalization on Lasix.  She is no longer on Lasix.  But continues to take potassium.  I asked her to hold the potassium until she hears from [place].   Current medicines are reviewed at length with the patient today.  The patient has concerns regarding medicines, potassium supplements..  The following changes have been made:  We will ask her to hold, potassium, until BMET is drawn Labs/ tests ordered today include: BMET No orders of the defined types were placed in this encounter.     Disposition:   FU with 3 months, after being seen by Dr. [name]  Signed, Jory Sims, NP  02/08/2015 3:52 PM    [address], [place], [place]  [address] Phone: [phone]; Fax: [phone]

## 2015-02-08 NOTE — Assessment & Plan Note (Signed)
Remains in normal sinus rhythm, sinus bradycardia, on amiodarone, and Coreg.  Xarelto is well-tolerated.  She is aware of when she will need to decrease amiodarone.  She will see Dr. [name] soon.

## 2015-02-08 NOTE — Progress Notes (Deleted)
Name: Isabella Hall    DOB: 13-Apr-1956  Age: 59 y.o.  MR#: [id number]       PCP:  Robert Bellow, MD      Insurance: Payor: Onnie Boer / Plan: [employer]/NAVIGATE / Product Type: *No Product type* /   CC:    Chief Complaint  Patient presents with  . Atrial Fibrillation  . Hypertension    VS Filed Vitals:   02/08/15 1540  BP: 130/92  Pulse: 62  Height: 5\' 6"  (1.676 m)  Weight: 274 lb (124.286 kg)  SpO2: 99%    Weights Current Weight  02/08/15 274 lb (124.286 kg)  01/29/15 274 lb 1.6 oz (124.331 kg)  11/09/12 290 lb 5.5 oz (131.7 kg)    Blood Pressure  BP Readings from Last 3 Encounters:  02/08/15 130/92  01/29/15 119/72  11/11/12 115/74     Admit date:  (Not on file) Last encounter with RMR:  Visit date not found   Allergy Review of patient's allergies indicates no known allergies.  Current Outpatient Prescriptions  Medication Sig Dispense Refill  . amiodarone (PACERONE) 200 MG tablet 1 tab PO BID x 2 wks and then reduce to 1 tab PO Daily beginning on 02/13/2015. 60 tablet 6  . carvedilol (COREG) 6.25 MG tablet Take 1 tablet (6.25 mg total) by mouth 2 (two) times daily with a meal. 60 tablet 6  . GARLIC PO Take 1 tablet by mouth daily.    Marland Kitchen levothyroxine (SYNTHROID) 150 MCG tablet Take 1 tablet (150 mcg total) by mouth daily. 30 tablet 11  . Multiple Vitamin (MULTIVITAMIN WITH MINERALS) TABS tablet Take 1 tablet by mouth daily.    . potassium chloride SA (K-DUR,KLOR-CON) 20 MEQ tablet Take 2 tablets (40 mEq total) by mouth daily. 60 tablet 6  . rivaroxaban (XARELTO) 20 MG TABS tablet Take 1 tablet (20 mg total) by mouth daily with supper. 30 tablet 6  . sodium chloride (OCEAN) 0.65 % nasal spray Place 1 spray into both nostrils 3 (three) times daily as needed. Uses daily, up to three times per day during allergy season     No current facility-administered medications for this visit.    Discontinued Meds:   There are no discontinued  medications.  Patient Active Problem List   Diagnosis Date Noted  . Nonischemic cardiomyopathy   . Atrial flutter 01/25/2015  . Acute diastolic heart failure   . Pain in the chest   . Demand ischemia   . Moderate mitral regurgitation   . Acute pulmonary edema   . Atrial fibrillation with RVR 01/20/2015  . Dyspnea 01/20/2015  . Diabetes type 2, controlled 01/20/2015  . PAF- last episode 2013 01/20/2015  . Acute CHF- secondary to AF with RVR 01/20/2015  . Benign essential HTN 01/20/2015  . Hypothyroid- TSH WNL 01/20/2015  . Obesity-BMI 45 01/20/2015  . Renal insufficiency-stage 3 01/20/2015  . Acute respiratory failure with hypoxia 01/20/2015  . Elevated troponin 01/20/2015  . CKD (chronic kidney disease), stage III 01/20/2015    LABS    Component Value Date/Time   NA 139 01/29/2015 0525   NA 139 01/28/2015 0900   NA 139 01/28/2015 0440   K 4.1 01/29/2015 0525   K 3.8 01/28/2015 0900   K 3.7 01/28/2015 0440   CL 108 01/29/2015 0525   CL 104 01/28/2015 0900   CL 107 01/28/2015 0440   CO2 25 01/29/2015 0525   CO2 25 01/28/2015 0900   CO2 28 01/28/2015 0440  GLUCOSE 90 01/29/2015 0525   GLUCOSE 190* 01/28/2015 0900   GLUCOSE 163* 01/28/2015 0440   BUN 21 01/29/2015 0525   BUN 17 01/28/2015 0900   BUN 16 01/28/2015 0440   CREATININE 1.64* 01/29/2015 0525   CREATININE 1.56* 01/28/2015 0900   CREATININE 1.41* 01/28/2015 0440   CALCIUM 8.5 01/29/2015 0525   CALCIUM 8.6 01/28/2015 0900   CALCIUM 8.4 01/28/2015 0440   GFRNONAA 34* 01/29/2015 0525   GFRNONAA 36* 01/28/2015 0900   GFRNONAA 41* 01/28/2015 0440   GFRAA 39* 01/29/2015 0525   GFRAA 42* 01/28/2015 0900   GFRAA 47* 01/28/2015 0440   CMP     Component Value Date/Time   NA 139 01/29/2015 0525   K 4.1 01/29/2015 0525   CL 108 01/29/2015 0525   CO2 25 01/29/2015 0525   GLUCOSE 90 01/29/2015 0525   BUN 21 01/29/2015 0525   CREATININE 1.64* 01/29/2015 0525   CALCIUM 8.5 01/29/2015 0525   PROT 6.5  11/09/2012 1049   ALBUMIN 2.9* 11/09/2012 1049   AST 15 11/09/2012 1049   ALT 13 11/09/2012 1049   ALKPHOS 48 11/09/2012 1049   BILITOT 0.3 11/09/2012 1049   GFRNONAA 34* 01/29/2015 0525   GFRAA 39* 01/29/2015 0525       Component Value Date/Time   WBC 6.0 01/28/2015 0440   WBC 6.9 01/27/2015 0200   WBC 8.6 01/26/2015 0843   HGB 10.8* 01/28/2015 0440   HGB 10.4* 01/27/2015 0200   HGB 12.6 01/26/2015 0843   HCT 34.2* 01/28/2015 0440   HCT 32.2* 01/27/2015 0200   HCT 39.7 01/26/2015 0843   MCV 79.2 01/28/2015 0440   MCV 78.9 01/27/2015 0200   MCV 81.0 01/26/2015 0843    Lipid Panel     Component Value Date/Time   CHOL 147 01/21/2015 0852   TRIG 61 01/21/2015 0852   HDL 45 01/21/2015 0852   CHOLHDL 3.3 01/21/2015 0852   VLDL 12 01/21/2015 0852   LDLCALC 90 01/21/2015 0852    ABG    Component Value Date/Time   PHART 7.264* 01/19/2015 2124   PCO2ART 48.8* 01/19/2015 2124   PO2ART 106.0* 01/19/2015 2124   HCO3 21.4 01/19/2015 2124   TCO2 19.8 01/19/2015 2124   ACIDBASEDEF 4.5* 01/19/2015 2124   O2SAT 96.5 01/19/2015 2124     Lab Results  Component Value Date   TSH 1.323 01/20/2015   BNP (last 3 results)  Recent Labs  01/19/15 2113 01/20/15 0122  BNP 688.0* 496.0*    ProBNP (last 3 results) No results for input(s): PROBNP in the last 8760 hours.  Cardiac Panel (last 3 results) No results for input(s): CKTOTAL, CKMB, TROPONINI, RELINDX in the last 72 hours.  Iron/TIBC/Ferritin/ %Sat No results found for: IRON, TIBC, FERRITIN, IRONPCTSAT   EKG Orders placed or performed during the hospital encounter of 01/19/15  . EKG 12-Lead  . EKG 12-Lead  . EKG 12-Lead  . EKG 12-Lead  . EKG 12-Lead  . EKG 12-Lead  . ED EKG  . EKG  . ED EKG  . EKG 12-Lead  . EKG 12-Lead  . EKG 12-Lead  . EKG 12-Lead  . EKG 12-Lead  . EKG 12-Lead  . EKG 12-Lead  . EKG 12-Lead  . EKG 12-Lead  . EKG 12-Lead  . EKG 12-Lead  . EKG 12-Lead  . EKG 12-Lead  . EKG 12-Lead   . EKG 12-Lead  . EKG 12-Lead  . EKG     Prior Assessment and Plan Problem List  as of 02/08/2015      Cardiovascular and Mediastinum   PAF- last episode 2013   Atrial fibrillation with RVR   Acute CHF- secondary to AF with RVR   Benign essential HTN   Acute diastolic heart failure   Demand ischemia   Moderate mitral regurgitation   Atrial flutter   Nonischemic cardiomyopathy     Respiratory   Acute respiratory failure with hypoxia   Acute pulmonary edema     Endocrine   Hypothyroid- TSH WNL   Diabetes type 2, controlled     Genitourinary   Renal insufficiency-stage 3   CKD (chronic kidney disease), stage III     Other   Obesity-BMI 45   Dyspnea   Elevated troponin   Pain in the chest       Imaging: Ct Angio Chest Pe W/cm &/or Wo Cm  01/19/2015   CLINICAL DATA:  Respiratory distress. Shortness of breath and diaphoresis for 1 day.  EXAM: CT ANGIOGRAPHY CHEST WITH CONTRAST  TECHNIQUE: Multidetector CT imaging of the chest was performed using the standard protocol during bolus administration of intravenous contrast. Multiplanar CT image reconstructions and MIPs were obtained to evaluate the vascular anatomy.  CONTRAST:  64mL OMNIPAQUE IOHEXOL 350 MG/ML SOLN  COMPARISON:  Radiograph earlier this day.  FINDINGS: There are no filling defects within the pulmonary arteries to suggest pulmonary embolus.  The thoracic aorta is normal in caliber without evidence of dissection. Heart is enlarged. Mild reflux of contrast into the IVC. There are bilateral pleural effusions with adjacent atelectasis in the lower lobes. Mild diffuse ground-glass opacities suggestive of pulmonary edema. There is central bronchial thickening. No focal consolidation to suggest pneumonia. No pulmonary mass. Scattered linear atelectasis throughout both lung. There are small superior mediastinal lymph nodes are likely reactive. No hilar adenopathy. No axillary adenopathy. There is no pericardial effusion.  No  definite acute abnormality in the included upper abdomen. There are no acute or suspicious osseous abnormalities.  Review of the MIP images confirms the above findings.  IMPRESSION: 1. No pulmonary embolus. 2. Findings consistent with CHF with cardiomegaly, bilateral pleural effusions and diffuse ground-glass opacities likely pulmonary edema.   Electronically Signed   By: Jeb Levering M.D.   On: 01/19/2015 23:36   Dg Chest Port 1 View  01/26/2015   CLINICAL DATA:  PICC line placement.  EXAM: PORTABLE CHEST - 1 VIEW  COMPARISON:  January 19, 2015.  FINDINGS: The heart size and mediastinal contours are within normal limits. Both lungs are clear. No pneumothorax or plural effusion is noted. Interval placement of right-sided PICC line with distal tip overlying expected position of cavoatrial junction. The visualized skeletal structures are unremarkable.  IMPRESSION: Interval placement of right-sided PICC line with distal tip overlying expected position of cavoatrial junction. No acute cardiopulmonary abnormality seen.   Electronically Signed   By: Marijo Conception, M.D.   On: 01/26/2015 22:34   Dg Chest Port 1 View  01/19/2015   CLINICAL DATA:  Dyspnea and respiratory distress. Symptoms for 1 day. Initial encounter.  EXAM: PORTABLE CHEST - 1 VIEW  COMPARISON:  11/09/2012  FINDINGS: The heart is enlarged. There is perivascular haziness concerning for pulmonary edema and mild vascular congestion. Ill-defined hazy opacity at the lung bases, may reflect small pleural effusions versus soft tissue attenuation from body habitus. No pneumothorax. No confluent airspace disease to suggest pneumonia. No acute osseous abnormalities are seen.  IMPRESSION: Cardiomegaly and probable pulmonary edema, findings suggest CHF. Questionable pleural effusions  versus soft tissue attenuation from body habitus.   Electronically Signed   By: Jeb Levering M.D.   On: 01/19/2015 21:39

## 2015-02-09 ENCOUNTER — Telehealth: Payer: Self-pay

## 2015-02-09 LAB — BASIC METABOLIC PANEL
BUN: 26 mg/dL — ABNORMAL HIGH (ref 6–23)
CO2: 29 meq/L (ref 19–32)
CREATININE: 1.51 mg/dL — AB (ref 0.50–1.10)
Calcium: 9.2 mg/dL (ref 8.4–10.5)
Chloride: 105 mEq/L (ref 96–112)
Glucose, Bld: 92 mg/dL (ref 70–99)
POTASSIUM: 5.3 meq/L (ref 3.5–5.3)
SODIUM: 139 meq/L (ref 135–145)

## 2015-02-09 LAB — POTASSIUM: POTASSIUM: 5.3 meq/L (ref 3.5–5.3)

## 2015-02-09 NOTE — Telephone Encounter (Signed)
-----   Message from Lendon Colonel, NP sent at 02/09/2015  1:23 PM EST ----- Potassium is 5.3. Does not need to take potassium supplements any longer.Kidney fx is ok

## 2015-02-09 NOTE — Telephone Encounter (Signed)
LM on private VM that pt may stop lasix and that kidney fx is ok.Copy of labs to pcp

## 2015-02-16 ENCOUNTER — Ambulatory Visit: Payer: 59 | Admitting: Adult Health

## 2015-03-03 ENCOUNTER — Other Ambulatory Visit: Payer: 59

## 2015-03-03 ENCOUNTER — Encounter: Payer: Self-pay | Admitting: Nurse Practitioner

## 2015-03-03 ENCOUNTER — Ambulatory Visit (INDEPENDENT_AMBULATORY_CARE_PROVIDER_SITE_OTHER): Payer: 59 | Admitting: Nurse Practitioner

## 2015-03-03 ENCOUNTER — Telehealth: Payer: Self-pay | Admitting: Nurse Practitioner

## 2015-03-03 ENCOUNTER — Encounter: Payer: 59 | Admitting: Internal Medicine

## 2015-03-03 ENCOUNTER — Other Ambulatory Visit: Payer: Self-pay | Admitting: *Deleted

## 2015-03-03 VITALS — BP 138/96 | HR 62 | Ht 66.0 in | Wt 273.8 lb

## 2015-03-03 DIAGNOSIS — I480 Paroxysmal atrial fibrillation: Secondary | ICD-10-CM

## 2015-03-03 DIAGNOSIS — R319 Hematuria, unspecified: Secondary | ICD-10-CM

## 2015-03-03 LAB — URINALYSIS, ROUTINE W REFLEX MICROSCOPIC
Bilirubin Urine: NEGATIVE
Ketones, ur: NEGATIVE
Nitrite: NEGATIVE
SPECIFIC GRAVITY, URINE: 1.02 (ref 1.000–1.030)
Total Protein, Urine: 30 — AB
URINE GLUCOSE: NEGATIVE
UROBILINOGEN UA: 0.2 (ref 0.0–1.0)
pH: 6 (ref 5.0–8.0)

## 2015-03-03 LAB — CBC WITH DIFFERENTIAL/PLATELET
BASOS PCT: 0.5 % (ref 0.0–3.0)
Basophils Absolute: 0 10*3/uL (ref 0.0–0.1)
EOS PCT: 7 % — AB (ref 0.0–5.0)
Eosinophils Absolute: 0.4 10*3/uL (ref 0.0–0.7)
HEMATOCRIT: 36 % (ref 36.0–46.0)
Hemoglobin: 12 g/dL (ref 12.0–15.0)
LYMPHS ABS: 1.5 10*3/uL (ref 0.7–4.0)
Lymphocytes Relative: 23.3 % (ref 12.0–46.0)
MCHC: 33.2 g/dL (ref 30.0–36.0)
MCV: 78.2 fl (ref 78.0–100.0)
MONOS PCT: 6.7 % (ref 3.0–12.0)
Monocytes Absolute: 0.4 10*3/uL (ref 0.1–1.0)
NEUTROS ABS: 4 10*3/uL (ref 1.4–7.7)
Neutrophils Relative %: 62.5 % (ref 43.0–77.0)
Platelets: 200 10*3/uL (ref 150.0–400.0)
RBC: 4.61 Mil/uL (ref 3.87–5.11)
RDW: 17.7 % — ABNORMAL HIGH (ref 11.5–15.5)
WBC: 6.4 10*3/uL (ref 4.0–10.5)

## 2015-03-03 LAB — BASIC METABOLIC PANEL
BUN: 27 mg/dL — ABNORMAL HIGH (ref 6–23)
CO2: 28 mEq/L (ref 19–32)
Calcium: 9.2 mg/dL (ref 8.4–10.5)
Chloride: 105 mEq/L (ref 96–112)
Creatinine, Ser: 1.53 mg/dL — ABNORMAL HIGH (ref 0.40–1.20)
GFR: 37.19 mL/min — AB (ref >60.00)
Glucose, Bld: 132 mg/dL — ABNORMAL HIGH (ref 70–99)
POTASSIUM: 4 meq/L (ref 3.5–5.1)
Sodium: 140 mEq/L (ref 135–145)

## 2015-03-03 MED ORDER — CIPROFLOXACIN HCL 500 MG PO TABS: 500.0000 mg | ORAL_TABLET | Freq: Two times a day (BID) | ORAL | Status: DC

## 2015-03-03 NOTE — Telephone Encounter (Signed)
New message      Want urine test results

## 2015-03-03 NOTE — Telephone Encounter (Signed)
Patient notified of urine results - called in cipro 500bid for 3 days and made follow up for 1 week after finishing antibiotics for repeat urine. Patient also informed of other lab results

## 2015-03-03 NOTE — Patient Instructions (Signed)
Your physician recommends that you return for lab work in: today    Your physician recommends that you schedule a follow-up appointment in: 3 months with Dr. [name] to talk about ablation

## 2015-03-04 ENCOUNTER — Telehealth: Payer: Self-pay | Admitting: Nurse Practitioner

## 2015-03-04 ENCOUNTER — Encounter: Payer: Self-pay | Admitting: Nurse Practitioner

## 2015-03-04 LAB — URINE CULTURE
COLONY COUNT: NO GROWTH
ORGANISM ID, BACTERIA: NO GROWTH

## 2015-03-04 MED ORDER — NITROFURANTOIN MONOHYD MACRO 100 MG PO CAPS: 100.0000 mg | ORAL_CAPSULE | Freq: Two times a day (BID) | ORAL | Status: AC

## 2015-03-04 NOTE — Telephone Encounter (Addendum)
rx called in for macrobid and cipro order was discontinued due to possible interaction between cipro and amiodarone  Patient notified of changes and rescheduled her repeat urine appointment as well. Patient verbalized understanding.

## 2015-03-04 NOTE — Telephone Encounter (Signed)
New message      Drug interaction between cipro and amiodarone.  Please call

## 2015-03-04 NOTE — Progress Notes (Signed)
Primary Care Physician: Robert Bellow, MD Primary Cardiologist: Primary Electrophysiologist:Allred Referring Physician:   [name] is a 59 y.o. female with a h/o HTN, Hypothyroidism, obseity, DM and persistent atrial fibrillation who presents for consultation in the [facility].  The patient was initially diagnosed with atrial fibrillation  after presenting with   in 2013 at which time she presented to the hospital in AF with RVR and converted with Diltiazem. She has had no recurrent palpitations and did well a week before admission,2/2, when she developed palpitations and shortness of breath. She called EMS on the day of admission because of worsening shortness of breath and was found to be in AF with RVR. She was taken to [facility] and was initially treated with medical therapy but had persistent atrial fibrillation. She was placed on amiodarone and underwent TEE/DCCV 01-25-15 at which time amiodarone was discontinued. She had recurrent AF with RVR and was transferred to [facility] for further evaluation and cardiac catheterization. Echocardiogram demonstrated EF 40-45%, moderate MR, LA 45.  Cardiac catheterization demonstrated normal coronaries with diastolic dysfunction. She converted to SR on amiodarone prior to repeat cardioversion. Mild renal insuffiencey at 1.51. Creatinine clearance at 81.6, appropriate dosed on Xarelto 20 mg a day.Cha2ds2vasc score of 2.  She was discharged on amiodarone 200 mg bid and after one month amiodarone was decreased to 200 mg a day. She does not report any further episodes of afib. Has noticed tea color urine more so in the am and has noticed some mild dysuria.. Other than tea colored urine, no other issues with bleeding.  Today, she denies symptoms of palpitations, chest pain, shortness of breath, orthopnea, PND, lower extremity edema, dizziness, presyncope, syncope, snoring, daytime somnolence, bleeding, or neurologic  sequela. The patient is tolerating medications without difficulties and is otherwise without complaint today.    Atrial Fibrillation Risk Factors:  she does have symptoms or diagnosis of sleep apnea. She is to have sleep syudy set up with PCP in the near future.  she does not have a history of rheumatic fever.  she does not have a history of alcohol use.  she has a BMI of Body mass index is 44.21 kg/(m^2).Marland Kitchen Filed Weights   03/03/15 1007  Weight: 273 lb 12.8 oz (124.195 kg)    LA size: 45 cm   Atrial Fibrillation Management history:  Previous antiarrhythmic drugs: none prior to amiodarone  Previous cardioversions: x1  Previous ablations: none  CHADS2VASC score: 2(Female/diastolic HGF)  Anticoagulation history: Xarelto   Past Medical History  Diagnosis Date  . Essential hypertension   . Hypothyroidism   . Atrial fibrillation      CHADSVASC score 4  . Obesity   . Atrial flutter   . Type 2 diabetes mellitus    Past Surgical History  Procedure Laterality Date  . Cholecystectomy  1979  . Tubal ligation  1980  . Tee without cardioversion N/A 01/25/2015    Procedure: TRANSESOPHAGEAL ECHOCARDIOGRAM (TEE);  Surgeon: Satira Sark, MD;  Location: [facility];  Service: Cardiovascular;  Laterality: N/A;  . Cardioversion N/A 01/25/2015    Procedure: CARDIOVERSION;  Surgeon: Satira Sark, MD;  Location: [facility];  Service: Cardiovascular;  Laterality: N/A;  . Left heart catheterization with coronary angiogram N/A 01/27/2015    Procedure: LEFT HEART CATHETERIZATION WITH CORONARY ANGIOGRAM;  Surgeon: Sinclair Grooms, MD;  Location: [facility] CATH LAB;  Service: Cardiovascular;  Laterality: N/A;    Current Outpatient Prescriptions  Medication Sig  Dispense Refill  . amiodarone (PACERONE) 200 MG tablet 1 tab PO BID x 2 wks and then reduce to 1 tab PO Daily beginning on 02/13/2015. 60 tablet 6  . carvedilol (COREG) 6.25 MG tablet Take 1 tablet (6.25 mg total) by mouth 2 (two) times  daily with a meal. 60 tablet 6  . GARLIC PO Take 1 tablet by mouth daily.    Marland Kitchen levothyroxine (SYNTHROID) 150 MCG tablet Take 1 tablet (150 mcg total) by mouth daily. 30 tablet 11  . Multiple Vitamin (MULTIVITAMIN WITH MINERALS) TABS tablet Take 1 tablet by mouth daily.    . rivaroxaban (XARELTO) 20 MG TABS tablet Take 1 tablet (20 mg total) by mouth daily with supper. 30 tablet 6  . sodium chloride (OCEAN) 0.65 % nasal spray Place 1 spray into both nostrils 3 (three) times daily as needed. Uses daily, up to three times per day during allergy season    . nitrofurantoin, macrocrystal-monohydrate, (MACROBID) 100 MG capsule Take 1 capsule (100 mg total) by mouth 2 (two) times daily. 14 capsule 0   No current facility-administered medications for this visit.    No Known Allergies  History   Social History  . Marital Status: Married    Spouse Name: N/A  . Number of Children: N/A  . Years of Education: N/A   Occupational History  . Not on file.   Social History Main Topics  . Smoking status: Never Smoker   . Smokeless tobacco: Never Used  . Alcohol Use: No  . Drug Use: No  . Sexual Activity: Not on file   Other Topics Concern  . Not on file   Social History Narrative    Family History  Problem Relation Age of Onset  . Atrial fibrillation Neg Hx   . Diabetes Neg Hx   . Hypertension    . Coronary artery disease Sister     coronary stents  . Cancer Mother   . Heart disease Father   . Cancer Maternal Grandmother    The patient does have a history of early familial atrial fibrillation with mother, aunt and 7 sisters all with afib.  ROS- All systems are reviewed and negative except as per the HPI above.  Physical Exam: Filed Vitals:   03/03/15 1007  BP: 138/96  Pulse: 62  Height: 5\' 6"  (1.676 m)  Weight: 273 lb 12.8 oz (124.195 kg)    GEN- The patient is well appearing, alert and oriented x 3 today.   Head- normocephalic, atraumatic Eyes-  Sclera clear, conjunctiva  pink Ears- hearing intact Oropharynx- clear Neck- supple, no JVP Lymph- no cervical lymphadenopathy Lungs- Clear to ausculation bilaterally, normal work of breathing Heart- Regular rate and rhythm, no murmurs, rubs or gallops, PMI not laterally displaced GI- soft, NT, ND, + BS Extremities- no clubbing, cyanosis, or edema MS- no significant deformity or atrophy Skin- no rash or lesion Psych- euthymic mood, full affect Neuro- strength and sensation are intact  EKG SR with QTc 472 ms, normal EKG  Echo Left ventricle: Systolic function was mildly to moderately reduced. The estimated ejection fraction was in the range of 40% to 45%. Diffuse hypokinesis. - Descending aorta: The descending aorta had mild diffuse disease. - Mitral valve: There was trivial regurgitation. - Left atrium: The atrium was dilated. No evidence of thrombus in the atrial cavity or appendage. There was mildintermittent spontaneous echo contrast (&quot;smoke&quot;) in the appendage. Appendage velocity reduced at approximately 20 cm/s. - Right atrium: No evidence of thrombus in  the atrial cavity or appendage. - Atrial septum: No defect or patent foramen ovale was identified. - Tricuspid valve: There was physiologic regurgitation. - Pericardium, extracardiac: There was no pericardial effusion.  Epic records are reviewed at length today  Assessment and Plan:  1. Atrial fibrillation The patient has  symptomatic  persistent atrial fibrillation.  The patients CHAD2VASC score is 2  she is  appropriately anticoagulated at this time. The patient is adequately rate controlled with carvedilol. Antiarrhythmic therapy to dates has included amiodarone.  The patients left atrial size is 45 mm.  Additional echo findings include reduced EF. A long discussion with the patient was had today regarding therapeutic strategies.  Extensive discussion of lifestyle modification including begin progressive daily aerobic  exercise program, attempt to lose weight, reduce salt in diet and cooking and continue current medications was also discussed.  Presently, our recommendations include   1.Symptomatic persistent Afib Continue amiodarone 200 mg daily. Continue xarelto.  2. Morbid obesity As above, lifestyle modification was discussed at length including regular exercise and weight reduction.  3. Obstructive sleep apnea The importance of adequate treatment of sleep apnea was discussed today in order to improve our ability to maintain sinus rhythm long term. She will set up with PCP  4. Tea colored urine with dysuria UA/cbc pending.  5. RI Bmet pending Will defer to PCP re addition of ace with reduced EF and  h/o RI.  6. F/U with Dr. [name] 3 months for consideration of ablation Echo will need repeating in 3 months to assess EF in NSR.     Roderic Palau NP  Nurse Practitioner, [facility] 03/04/2015 10:12 AM

## 2015-03-05 ENCOUNTER — Telehealth: Payer: Self-pay | Admitting: *Deleted

## 2015-03-05 NOTE — Telephone Encounter (Signed)
Called [facility] per dr. Vickey Sages recommendation. Spoke with triage nurse to receive an appointment asap - triage asked that I fax over office notes/labs and also send a referral for her insurance (spoke with sara from dr. Vickey Sages office and will send this to [facility]). Awaiting call back regarding appointment

## 2015-03-05 NOTE — Telephone Encounter (Signed)
Pt called to say her urine is now clear and she is no longer having pain with urination. Patient states she was increasing her water intake since her appointment earlier this week.  She wants to know whether it is still necessary for her to be referred to a urologist since her symptoms are gone. I told her to monitor her urine characteristics over the weekend and I would call her on Monday after I had discussed her case with Ceasar Lund to determine whether the referral was still necessary or if she needed to come back and repeat a urine test. Patient was satisfied with this and would monitor her urine over the weekend and expect my call on Monday.

## 2015-03-08 NOTE — Telephone Encounter (Addendum)
Lm to call back to schedule UA   Patient called back and stated her urine was clear over weekend. She has an appointment with Dr. Karie Kirks on Thursday and would like to repeat urine there. I told her to have results faxed to our office as well.  Will call Dr. Vickey Sages nurse to inform her of events that have occurred since Friday and pt plan to repeat urine there.

## 2015-03-09 ENCOUNTER — Telehealth: Payer: Self-pay | Admitting: *Deleted

## 2015-03-09 NOTE — Telephone Encounter (Signed)
Dr. Vickey Sages nurse Fraser Din was notified of patient repeating urine on her return visit to their office on Thursday (pat confirmed the patient did have an appointment that day) if urine is clear of blood then no further workup but if blood is still in urine then appointment with [facility] will need to be pursued.    [facility] did call back today (3/22) to confirm they received all the information needed for referral and if the patient needed to be seen after the repeat urine then to call back and they will make the appointment.

## 2015-03-12 ENCOUNTER — Other Ambulatory Visit: Payer: 59

## 2015-03-17 ENCOUNTER — Other Ambulatory Visit: Payer: 59

## 2015-06-09 ENCOUNTER — Ambulatory Visit: Payer: 59 | Admitting: Internal Medicine

## 2015-07-14 ENCOUNTER — Encounter: Payer: Self-pay | Admitting: Internal Medicine

## 2015-07-14 ENCOUNTER — Ambulatory Visit (INDEPENDENT_AMBULATORY_CARE_PROVIDER_SITE_OTHER): Payer: 59 | Admitting: Internal Medicine

## 2015-07-14 VITALS — BP 160/100 | HR 64 | Ht 66.0 in | Wt 308.0 lb

## 2015-07-14 DIAGNOSIS — I480 Paroxysmal atrial fibrillation: Secondary | ICD-10-CM

## 2015-07-14 DIAGNOSIS — I10 Essential (primary) hypertension: Secondary | ICD-10-CM

## 2015-07-14 DIAGNOSIS — I4891 Unspecified atrial fibrillation: Secondary | ICD-10-CM

## 2015-07-14 DIAGNOSIS — E669 Obesity, unspecified: Secondary | ICD-10-CM

## 2015-07-14 DIAGNOSIS — I429 Cardiomyopathy, unspecified: Secondary | ICD-10-CM

## 2015-07-14 DIAGNOSIS — I428 Other cardiomyopathies: Secondary | ICD-10-CM

## 2015-07-14 MED ORDER — LISINOPRIL 5 MG PO TABS: 5.0000 mg | ORAL_TABLET | Freq: Every day | ORAL | Status: DC

## 2015-07-14 NOTE — Progress Notes (Signed)
PCP: Robert Bellow, MD Primary Cardiologist:  Dr Lisabeth Register Isabella Hall is a 59 y.o. female who presents today for routine electrophysiology followup.  Since last being seen in our clinic, the patient reports doing very well.  She has very rare palpitations.  She appears to be maintaining sinus rhythm.   Unfortunately, she has not made any real progress with lifestyle modification.  She has stopped her amiodarone due to concerns of this medicine.  Today, she denies symptoms of chest pain, shortness of breath,  lower extremity edema, dizziness, presyncope, or syncope.  The patient is otherwise without complaint today.   Past Medical History  Diagnosis Date  . Essential hypertension   . Hypothyroidism   . Atrial fibrillation      CHADSVASC score 4  . Obesity   . Atrial flutter   . Type 2 diabetes mellitus    Past Surgical History  Procedure Laterality Date  . Cholecystectomy  1979  . Tubal ligation  1980  . Tee without cardioversion N/A 01/25/2015    Procedure: TRANSESOPHAGEAL ECHOCARDIOGRAM (TEE);  Surgeon: Satira Sark, MD;  Location: [facility];  Service: Cardiovascular;  Laterality: N/A;  . Cardioversion N/A 01/25/2015    Procedure: CARDIOVERSION;  Surgeon: Satira Sark, MD;  Location: [facility];  Service: Cardiovascular;  Laterality: N/A;  . Left heart catheterization with coronary angiogram N/A 01/27/2015    Procedure: LEFT HEART CATHETERIZATION WITH CORONARY ANGIOGRAM;  Surgeon: Sinclair Grooms, MD;  Location: [facility] CATH LAB;  Service: Cardiovascular;  Laterality: N/A;    ROS- all systems are reviewed and negatives except as per HPI above  Current Outpatient Prescriptions  Medication Sig Dispense Refill  . carvedilol (COREG) 6.25 MG tablet Take 1 tablet (6.25 mg total) by mouth 2 (two) times daily with a meal. 60 tablet 6  . GARLIC PO Take 1 tablet by mouth daily.    Marland Kitchen levothyroxine (SYNTHROID) 150 MCG tablet Take 1 tablet (150 mcg total) by mouth daily. 30 tablet 11  .  Multiple Vitamin (MULTIVITAMIN WITH MINERALS) TABS tablet Take 1 tablet by mouth daily.    . rivaroxaban (XARELTO) 20 MG TABS tablet Take 1 tablet (20 mg total) by mouth daily with supper. 30 tablet 6  . sodium chloride (OCEAN) 0.65 % nasal spray Place 1 spray into both nostrils 3 (three) times daily as needed. Uses daily, up to three times per day during allergy season    . lisinopril (PRINIVIL,ZESTRIL) 5 MG tablet Take 1 tablet (5 mg total) by mouth daily. 90 tablet 3   No current facility-administered medications for this visit.    Physical Exam: Filed Vitals:   07/14/15 1620  BP: 160/100  Pulse: 64  Height: 5\' 6"  (1.676 m)  Weight: 139.708 kg (308 lb)    GEN- The patient is well appearing, alert and oriented x 3 today.   Head- normocephalic, atraumatic Eyes-  Sclera clear, conjunctiva pink Ears- hearing intact Oropharynx- clear Lungs- Clear to ausculation bilaterally, normal work of breathing Heart- Regular rate and rhythm, no murmurs, rubs or gallops, PMI not laterally displaced GI- soft, NT, ND, + BS Extremities- no clubbing, cyanosis, or edema  Assessment and Plan:  1. afib Currently in sinus rhythm off of amiodarone I suspect that it is a matter of time before her afib returns given obesity The importance of lifestyle change was discussed today Should her afib return, we could consider tikosyn. She is not a candidate for ablation given her size.  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 HTN Above goal Add lisinopril 5mg  daily Will need close follow-up given CRI by Dr [name]  4. Morbid obesity Body mass index is 49.74 kg/(m^2). As above I have strongly advised that she be considered for bariatric surgery.  She says that she is willing to go to the bariatric clinic at [facility] for evaluation  Follow-up with Dr [name] in 3 months I will see when needed going forward

## 2015-07-14 NOTE — Patient Instructions (Addendum)
Medication Instructions:  Your physician has recommended you make the following change in your medication:  1) Start Lisinopril 5 mg daily   Labwork: None ordered  Testing/Procedures: You have been referred to Dr Durward Parcel surgery   Your physician has requested that you have an echocardiogram. Echocardiography is a painless test that uses sound waves to create images of your heart. It provides your doctor with information about the size and shape of your heart and how well your heart's chambers and valves are working. This procedure takes approximately one hour. There are no restrictions for this procedure.     Follow-Up: Your physician recommends that you schedule a follow-up appointment in: 3 months with Dr Shawna Orleans    Any Other Special Instructions Will Be Listed Below (If Applicable).  Low-Sodium Eating Plan Sodium raises blood pressure and causes water to be held in the body. Getting less sodium from food will help lower your blood pressure, reduce any swelling, and protect your heart, liver, and kidneys. We get sodium by adding salt (sodium chloride) to food. Most of our sodium comes from canned, boxed, and frozen foods. Restaurant foods, fast foods, and pizza are also very high in sodium. Even if you take medicine to lower your blood pressure or to reduce fluid in your body, getting less sodium from your food is important. WHAT IS MY PLAN? Most people should limit their sodium intake to 2,300 mg a day. Your health care provider recommends that you limit your sodium intake to 2 gram a day.  WHAT DO I NEED TO KNOW ABOUT THIS EATING PLAN? For the low-sodium eating plan, you will follow these general guidelines:  Choose foods with a % Daily Value for sodium of less than 5% (as listed on the food label).   Use salt-free seasonings or herbs instead of table salt or sea salt.   Check with your health care provider or pharmacist before using salt substitutes.    Eat fresh foods.  Eat more vegetables and fruits.  Limit canned vegetables. If you do use them, rinse them well to decrease the sodium.   Limit cheese to 1 oz (28 g) per day.   Eat lower-sodium products, often labeled as "lower sodium" or "no salt added."  Avoid foods that contain monosodium glutamate (MSG). MSG is sometimes added to [place] food and some canned foods.  Check food labels (Nutrition Facts labels) on foods to learn how much sodium is in one serving.  Eat more home-cooked food and less restaurant, buffet, and fast food.  When eating at a restaurant, ask that your food be prepared with less salt or none, if possible.  HOW DO I READ FOOD LABELS FOR SODIUM INFORMATION? The Nutrition Facts label lists the amount of sodium in one serving of the food. If you eat more than one serving, you must multiply the listed amount of sodium by the number of servings. Food labels may also identify foods as:  Sodium free--Less than 5 mg in a serving.  Very low sodium--35 mg or less in a serving.  Low sodium--140 mg or less in a serving.  Light in sodium--50% less sodium in a serving. For example, if a food that usually has 300 mg of sodium is changed to become light in sodium, it will have 150 mg of sodium.  Reduced sodium--25% less sodium in a serving. For example, if a food that usually has 400 mg of sodium is changed to reduced sodium, it will have 300 mg of sodium.  WHAT FOODS CAN I EAT? Grains Low-sodium cereals, including oats, puffed wheat and rice, and shredded wheat cereals. Low-sodium crackers. Unsalted rice and pasta. Lower-sodium bread.  Vegetables Frozen or fresh vegetables. Low-sodium or reduced-sodium canned vegetables. Low-sodium or reduced-sodium tomato sauce and paste. Low-sodium or reduced-sodium tomato and vegetable juices.  Fruits Fresh, frozen, and canned fruit. Fruit juice.  Meat and Other Protein Products Low-sodium canned tuna and salmon. Fresh  or frozen meat, poultry, seafood, and fish. Lamb. Unsalted nuts. Dried beans, peas, and lentils without added salt. Unsalted canned beans. Homemade soups without salt. Eggs.  Dairy Milk. Soy milk. Ricotta cheese. Low-sodium or reduced-sodium cheeses. Yogurt.  Condiments Fresh and dried herbs and spices. Salt-free seasonings. Onion and garlic powders. Low-sodium varieties of mustard and ketchup. Lemon juice.  Fats and Oils Reduced-sodium salad dressings. Unsalted butter.  Other Unsalted popcorn and pretzels.  The items listed above may not be a complete list of recommended foods or beverages. Contact your dietitian for more options. WHAT FOODS ARE NOT RECOMMENDED? Grains Instant hot cereals. Bread stuffing, pancake, and biscuit mixes. Croutons. Seasoned rice or pasta mixes. Noodle soup cups. Boxed or frozen macaroni and cheese. Self-rising flour. Regular salted crackers. Vegetables Regular canned vegetables. Regular canned tomato sauce and paste. Regular tomato and vegetable juices. Frozen vegetables in sauces. Salted french fries. Olives. Angie Fava. Relishes. Sauerkraut. Salsa. Meat and Other Protein Products Salted, canned, smoked, spiced, or pickled meats, seafood, or fish. Bacon, ham, sausage, hot dogs, corned beef, chipped beef, and packaged luncheon meats. Salt pork. Jerky. Pickled herring. Anchovies, regular canned tuna, and sardines. Salted nuts. Dairy Processed cheese and cheese spreads. Cheese curds. Blue cheese and cottage cheese. Buttermilk.  Condiments Onion and garlic salt, seasoned salt, table salt, and sea salt. Canned and packaged gravies. Worcestershire sauce. Tartar sauce. Barbecue sauce. Teriyaki sauce. Soy sauce, including reduced sodium. Steak sauce. Fish sauce. Oyster sauce. Cocktail sauce. Horseradish. Regular ketchup and mustard. Meat flavorings and tenderizers. Bouillon cubes. Hot sauce. Tabasco sauce. Marinades. Taco seasonings. Relishes. Fats and  Oils Regular salad dressings. Salted butter. Margarine. Ghee. Bacon fat.  Other Potato and tortilla chips. Corn chips and puffs. Salted popcorn and pretzels. Canned or dried soups. Pizza. Frozen entrees and pot pies.  The items listed above may not be a complete list of foods and beverages to avoid. Contact your dietitian for more information. Document Released: 05/26/2002 Document Revised: 12/09/2013 Document Reviewed: 10/08/2013 [facility] Patient Information 2015 [place], [place]. This information is not intended to replace advice given to you by your health care provider. Make sure you discuss any questions you have with your health care provider.

## 2015-07-26 ENCOUNTER — Other Ambulatory Visit (HOSPITAL_COMMUNITY): Payer: 59

## 2015-08-09 ENCOUNTER — Ambulatory Visit (HOSPITAL_COMMUNITY)
Admission: RE | Admit: 2015-08-09 | Discharge: 2015-08-09 | Disposition: A | Discharge status: Home / Self Care | Payer: 59 | Source: Ambulatory Visit | Attending: Internal Medicine | Admitting: Internal Medicine

## 2015-08-09 ENCOUNTER — Telehealth: Payer: Self-pay | Admitting: Cardiovascular Disease

## 2015-08-09 DIAGNOSIS — I480 Paroxysmal atrial fibrillation: Secondary | ICD-10-CM

## 2015-08-09 NOTE — Telephone Encounter (Signed)
Apt made with Dr [name] per Dr [name]

## 2015-08-09 NOTE — Telephone Encounter (Signed)
Pt taken off amiodarone by Dr [name].pt was to have echo this am but was told she is back in Afib and wants to know if she should go back on Amiodarone

## 2015-08-09 NOTE — Telephone Encounter (Signed)
Patient has question about medication she needs to be taking

## 2015-08-09 NOTE — Telephone Encounter (Signed)
   °    °    Hi James,  Echo tech from [facility] called to let me know her HR is 140 bpm and appears to be a fib based on one lead. Thought you may want to see her again for possible Tikosyn loading.  Just an FYI.  Thanks.   Erick Alley,  Can you please arrange for Mrs. Dettloff to see Dr. [name] sooner than later? Thanks.      Left message for patient to notify of upcoming appt with Dr [name]  09/15/15 @ 3:30

## 2015-08-09 NOTE — Telephone Encounter (Signed)
I messaged Stephanie earlier this morning about this patient. Echo tech called me about patient being in a fib. She needs a f/u appt with Dr. [name] asap, as he mentioned possibly loading her with Tikosyn.

## 2015-08-10 ENCOUNTER — Telehealth: Payer: Self-pay | Admitting: Internal Medicine

## 2015-08-10 NOTE — Telephone Encounter (Signed)
Pt was referred to Afib clinic per Dr. [name], pt was taken off Amiodarone due to being out of afib, also on Carvedilol and Xarelto, back in afib, hence the referral to Afib clinic, however she wants to know if she should go back on it until her appt 08-18-15 (pt requested this date). Pls advise

## 2015-08-10 NOTE — Telephone Encounter (Signed)
She weaned herself off Amiodarone and when here in July had not had Afib.  She has had an episode and was wondering if she should go back on Amiodarone but says Dr [name] does not want her to stay on it long term. The notes talk about Tikosyn  I let her know I would discuss with Dr [name] tomorrow and call her back.  She has a follow up appointment nest week 08/18/15 in afib clinic

## 2015-08-11 NOTE — Telephone Encounter (Signed)
Discussed with Dr [name] and will have her seen in afib clinic next week

## 2015-08-18 ENCOUNTER — Inpatient Hospital Stay (HOSPITAL_COMMUNITY): Admission: RE | Admit: 2015-08-18 | Payer: 59 | Source: Ambulatory Visit | Admitting: Nurse Practitioner

## 2015-08-24 ENCOUNTER — Other Ambulatory Visit: Payer: Self-pay | Admitting: *Deleted

## 2015-08-24 ENCOUNTER — Other Ambulatory Visit: Payer: Self-pay | Admitting: Nurse Practitioner

## 2015-08-24 MED ORDER — CARVEDILOL 6.25 MG PO TABS: 6.2500 mg | ORAL_TABLET | Freq: Two times a day (BID) | ORAL | Status: DC

## 2015-08-26 ENCOUNTER — Inpatient Hospital Stay (HOSPITAL_COMMUNITY): Admission: RE | Admit: 2015-08-26 | Payer: 59 | Source: Ambulatory Visit | Admitting: Nurse Practitioner

## 2015-09-14 ENCOUNTER — Other Ambulatory Visit: Payer: Self-pay | Admitting: Nurse Practitioner

## 2015-09-15 ENCOUNTER — Ambulatory Visit: Payer: 59 | Admitting: Internal Medicine

## 2015-10-18 ENCOUNTER — Other Ambulatory Visit: Payer: Self-pay | Admitting: Cardiovascular Disease

## 2015-11-01 ENCOUNTER — Other Ambulatory Visit: Payer: Self-pay | Admitting: Cardiovascular Disease

## 2015-11-09 ENCOUNTER — Other Ambulatory Visit: Payer: Self-pay | Admitting: Cardiovascular Disease

## 2015-11-09 MED ORDER — RIVAROXABAN 20 MG PO TABS: 20.0000 mg | ORAL_TABLET | Freq: Every day | ORAL | Status: DC

## 2015-11-09 NOTE — Telephone Encounter (Signed)
Pt is out of her Xarelto, she's wondering if she can get some sent to the pharmacy to last till her apt

## 2015-11-26 ENCOUNTER — Other Ambulatory Visit: Payer: Self-pay | Admitting: Cardiovascular Disease

## 2015-11-26 MED ORDER — CARVEDILOL 6.25 MG PO TABS: 6.2500 mg | ORAL_TABLET | Freq: Two times a day (BID) | ORAL | Status: DC

## 2015-11-26 NOTE — Telephone Encounter (Signed)
Pt is out of her Coreg and doesn't have any to last her through the weekend, she uses [facility]

## 2015-11-26 NOTE — Telephone Encounter (Signed)
Pt was no show for last to apts,is a new pt to Dr Shawna Orleans.Has apt this Monday 11/29/15 and will give her more refills if she comes to apt

## 2015-11-29 ENCOUNTER — Encounter: Payer: Self-pay | Admitting: Cardiovascular Disease

## 2015-11-29 ENCOUNTER — Ambulatory Visit (INDEPENDENT_AMBULATORY_CARE_PROVIDER_SITE_OTHER): Payer: 59 | Admitting: Cardiovascular Disease

## 2015-11-29 VITALS — BP 110/70 | HR 92 | Ht 67.0 in | Wt 314.0 lb

## 2015-11-29 DIAGNOSIS — I10 Essential (primary) hypertension: Secondary | ICD-10-CM | POA: Diagnosis not present

## 2015-11-29 DIAGNOSIS — I480 Paroxysmal atrial fibrillation: Secondary | ICD-10-CM | POA: Diagnosis not present

## 2015-11-29 DIAGNOSIS — I428 Other cardiomyopathies: Secondary | ICD-10-CM

## 2015-11-29 DIAGNOSIS — I429 Cardiomyopathy, unspecified: Secondary | ICD-10-CM

## 2015-11-29 NOTE — Progress Notes (Signed)
Patient ID: Isabella Hall, female   DOB: 1956/04/17, 59 y.o.   MRN: [id number]      SUBJECTIVE: The patient presents for follow-up of paroxysmal atrial fibrillation, hypertension, tachycardia mediated cardiomyopathy, and morbid obesity. She was supposed to have a follow-up echocardiogram in August 2016 but she did not get this done as she was in rapid atrial fibrillation at the time.  She is feeling well and seldom has palpitations, and utilizes Valsalva maneuvers which have helped in most circumstances. She avoids caffeine and chocolate. She knows she needs to lose weight but is sometimes afraid to elevate her heart rate for fear of going into rapid atrial fibrillation. She uses a recumbent bicycle and walks.   Review of Systems: As per "subjective", otherwise negative.  No Known Allergies  Current Outpatient Prescriptions  Medication Sig Dispense Refill  . carvedilol (COREG) 6.25 MG tablet Take 1 tablet (6.25 mg total) by mouth 2 (two) times daily with a meal. 15 tablet 0  . GARLIC PO Take 1 tablet by mouth daily.    Marland Kitchen levothyroxine (SYNTHROID) 150 MCG tablet Take 1 tablet (150 mcg total) by mouth daily. 30 tablet 11  . lisinopril (PRINIVIL,ZESTRIL) 5 MG tablet Take 1 tablet (5 mg total) by mouth daily. 90 tablet 3  . Multiple Vitamin (MULTIVITAMIN WITH MINERALS) TABS tablet Take 1 tablet by mouth daily.    . rivaroxaban (XARELTO) 20 MG TABS tablet Take 1 tablet (20 mg total) by mouth daily with supper. 30 tablet 0  . sodium chloride (OCEAN) 0.65 % nasal spray Place 1 spray into both nostrils 3 (three) times daily as needed. Uses daily, up to three times per day during allergy season     No current facility-administered medications for this visit.    Past Medical History  Diagnosis Date  . Essential hypertension   . Hypothyroidism   . Atrial fibrillation (HCC)      CHADSVASC score 4  . Obesity   . Atrial flutter ([place])   . Type 2 diabetes mellitus [facility])     Past Surgical  History  Procedure Laterality Date  . Cholecystectomy  1979  . Tubal ligation  1980  . Tee without cardioversion N/A 01/25/2015    Procedure: TRANSESOPHAGEAL ECHOCARDIOGRAM (TEE);  Surgeon: Satira Sark, MD;  Location: [facility];  Service: Cardiovascular;  Laterality: N/A;  . Cardioversion N/A 01/25/2015    Procedure: CARDIOVERSION;  Surgeon: Satira Sark, MD;  Location: [facility];  Service: Cardiovascular;  Laterality: N/A;  . Left heart catheterization with coronary angiogram N/A 01/27/2015    Procedure: LEFT HEART CATHETERIZATION WITH CORONARY ANGIOGRAM;  Surgeon: Sinclair Grooms, MD;  Location: [facility] CATH LAB;  Service: Cardiovascular;  Laterality: N/A;    Social History   Social History  . Marital Status: Married    Spouse Name: N/A  . Number of Children: N/A  . Years of Education: N/A   Occupational History  . Not on file.   Social History Main Topics  . Smoking status: Never Smoker   . Smokeless tobacco: Never Used  . Alcohol Use: No  . Drug Use: No  . Sexual Activity: Not on file   Other Topics Concern  . Not on file   Social History Narrative     Filed Vitals:   11/29/15 0838  BP: 110/70  Pulse: 92  Height: 5\' 7"  (1.702 m)  Weight: 314 lb (142.429 kg)  SpO2: 99%    PHYSICAL EXAM General: NAD HEENT: Normal. Neck: No  JVD, no thyromegaly. Lungs: Clear to auscultation bilaterally with normal respiratory effort. CV: Nondisplaced PMI.  Regular rate and rhythm, normal S1/S2, no S3/S4, no murmur. No pretibial or periankle edema.  No carotid bruit.  Abdomen: Obese.  Neurologic: Alert and oriented x 3.  Psych: Normal affect. Skin: Normal. Musculoskeletal: No gross deformities. Extremities: No clubbing or cyanosis.   ECG: Most recent ECG reviewed.      ASSESSMENT AND PLAN: 1. Paroxysmal atrial fibrillation: Symptomatically stable on Coreg. Continue Xarelto. If she were to have recurrences, Dr. [name] has mentioned using Tikosyn.  2. Tachycardia-mediated  cardiomyopathy: No evidence of heart failure. Continue Coreg and lisinopril.  3. Essential HTN: Controlled. No changes.  Dispo: f/u 1 year.   Kate Sable, M.D., F.A.C.C.

## 2015-11-29 NOTE — Patient Instructions (Signed)
Your physician wants you to follow-up in:  1 year with Dr Koneswaran You will receive a reminder letter in the mail two months in advance. If you don't receive a letter, please call our office to schedule the follow-up appointment.    Your physician recommends that you continue on your current medications as directed. Please refer to the Current Medication list given to you today.    If you need a refill on your cardiac medications before your next appointment, please call your pharmacy.     Thank you for choosing [facility] !        

## 2015-12-07 ENCOUNTER — Other Ambulatory Visit: Payer: Self-pay | Admitting: Cardiovascular Disease

## 2016-04-06 ENCOUNTER — Encounter: Payer: Self-pay | Admitting: Cardiovascular Disease

## 2016-04-06 ENCOUNTER — Ambulatory Visit (INDEPENDENT_AMBULATORY_CARE_PROVIDER_SITE_OTHER): Payer: BLUE CROSS/BLUE SHIELD | Admitting: Cardiovascular Disease

## 2016-04-06 VITALS — BP 118/75 | HR 71 | Ht 67.0 in | Wt 300.0 lb

## 2016-04-06 DIAGNOSIS — I428 Other cardiomyopathies: Secondary | ICD-10-CM

## 2016-04-06 DIAGNOSIS — I5031 Acute diastolic (congestive) heart failure: Secondary | ICD-10-CM | POA: Diagnosis not present

## 2016-04-06 DIAGNOSIS — I4891 Unspecified atrial fibrillation: Secondary | ICD-10-CM

## 2016-04-06 DIAGNOSIS — I429 Cardiomyopathy, unspecified: Secondary | ICD-10-CM

## 2016-04-06 DIAGNOSIS — I10 Essential (primary) hypertension: Secondary | ICD-10-CM

## 2016-04-06 DIAGNOSIS — R0601 Orthopnea: Secondary | ICD-10-CM

## 2016-04-06 MED ORDER — FUROSEMIDE 20 MG PO TABS: 20.0000 mg | ORAL_TABLET | Freq: Every day | ORAL | Status: DC

## 2016-04-06 MED ORDER — POTASSIUM CHLORIDE ER 10 MEQ PO TBCR: 10.0000 meq | EXTENDED_RELEASE_TABLET | Freq: Every day | ORAL | Status: DC

## 2016-04-06 MED ORDER — DILTIAZEM HCL ER COATED BEADS 120 MG PO CP24: 120.0000 mg | ORAL_CAPSULE | ORAL | Status: DC

## 2016-04-06 NOTE — Patient Instructions (Addendum)
   Begin Lasix 20mg  daily.  Begin Potassium 15meq daily.  Begin Diltiazem CD 120mg  every morning.    All medications above sent to [facility] today.  Continue all other medications.   Lab for BMET, Magnesium - do this Monday, 04/10/2016 - orders given today. Office will contact with results via phone or letter.   Nurse visit in 1 week for EKG. Follow up with Dr. [name]. Follow up with Dr. [name] after Dr. [name]

## 2016-04-06 NOTE — Progress Notes (Signed)
Patient ID: Isabella Hall, female   DOB: 06/18/1956, 60 y.o.   MRN: [id number]      SUBJECTIVE: The patient presents for follow-up of paroxysmal atrial fibrillation, hypertension, tachycardia mediated cardiomyopathy, and morbid obesity. Transesophageal echocardiogram 01/25/15 EF 40-45% with diffuse hypokinesis.  Recent echocardiogram performed at an outside facility 03/01/16 showed normal left ventricular systolic function, EF [id number].  For the past several days she has had exertional dyspnea and orthopnea. She sleeps on 2 pillows. She took her sister's 40 mg of Lasix and felt better. Denies exertional chest pain. She wakes up in the morning and her stomach feels bloated.  ECG shows rapid atrial fibrillation, heart rate 166 bpm.  Review of Systems: As per "subjective", otherwise negative.  No Known Allergies  Current Outpatient Prescriptions  Medication Sig Dispense Refill  . carvedilol (COREG) 6.25 MG tablet Take 1 tablet (6.25 mg total) by mouth 2 (two) times daily with a meal. 15 tablet 0  . GARLIC PO Take 1 tablet by mouth daily.    Marland Kitchen levothyroxine (SYNTHROID) 150 MCG tablet Take 1 tablet (150 mcg total) by mouth daily. 30 tablet 11  . Multiple Vitamin (MULTIVITAMIN WITH MINERALS) TABS tablet Take 1 tablet by mouth daily.    . sodium chloride (OCEAN) 0.65 % nasal spray Place 1 spray into both nostrils 3 (three) times daily as needed. Uses daily, up to three times per day during allergy season    . XARELTO 20 MG TABS tablet TAKE ONE TABLET BY MOUTH ONCE DAILY WITH SUPPER 30 tablet 11   No current facility-administered medications for this visit.    Past Medical History  Diagnosis Date  . Essential hypertension   . Hypothyroidism   . Atrial fibrillation (HCC)      CHADSVASC score 4  . Obesity   . Atrial flutter ([place])   . Type 2 diabetes mellitus [facility])     Past Surgical History  Procedure Laterality Date  . Cholecystectomy  1979  . Tubal ligation  1980  . Tee without  cardioversion N/A 01/25/2015    Procedure: TRANSESOPHAGEAL ECHOCARDIOGRAM (TEE);  Surgeon: Satira Sark, MD;  Location: [facility];  Service: Cardiovascular;  Laterality: N/A;  . Cardioversion N/A 01/25/2015    Procedure: CARDIOVERSION;  Surgeon: Satira Sark, MD;  Location: [facility];  Service: Cardiovascular;  Laterality: N/A;  . Left heart catheterization with coronary angiogram N/A 01/27/2015    Procedure: LEFT HEART CATHETERIZATION WITH CORONARY ANGIOGRAM;  Surgeon: Sinclair Grooms, MD;  Location: [facility] CATH LAB;  Service: Cardiovascular;  Laterality: N/A;    Social History   Social History  . Marital Status: Married    Spouse Name: N/A  . Number of Children: N/A  . Years of Education: N/A   Occupational History  . Not on file.   Social History Main Topics  . Smoking status: Never Smoker   . Smokeless tobacco: Never Used  . Alcohol Use: No  . Drug Use: No  . Sexual Activity: Not on file   Other Topics Concern  . Not on file   Social History Narrative     Filed Vitals:   04/06/16 1410  BP: 118/75  Pulse: 71  Height: 5\' 7"  (1.702 m)  Weight: 300 lb (136.079 kg)  SpO2: 98%    PHYSICAL EXAM General: NAD HEENT: Normal. Neck: No JVD, no thyromegaly. Lungs: Clear to auscultation bilaterally with normal respiratory effort. CV: Tachycardic, irregular rhythm, normal S1/S2, no S3, no murmur. No pretibial or periankle edema.  Abdomen: Obese.  Neurologic: Alert and oriented x 3.  Psych: Normal affect. Skin: Normal. Musculoskeletal: No gross deformities. Extremities: No clubbing or cyanosis.   ECG: Most recent ECG reviewed.      ASSESSMENT AND PLAN: 1. Rapid atrial fibrillation: Tachycardic with on Coreg 6.25 mg BID. Unable to increase further without lowering BP. Will add long-acting diltiazem 120 mg q am. Continue Xarelto. She may need to f/u with Dr. [name] has mentioned using Tikosyn for recurrences. Will have her return in one week for ECG with nurse.  2.  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 Continue Coreg. No longer on ACEI. Will start Lasix 20 mg daily with 10 meq KCl daily. Will check BMET and magnesium 4/24. Aim to control HR.  3. Essential HTN: Monitor given addition of diltiazem.  Dispo: fu 1 week for ECG. Fu with me after seeing Dr. [name].  Time spent: 40 minutes, of which greater than 50% was spent reviewing symptoms, relevant blood tests and studies, and discussing management plan with the patient.   Kate Sable, M.D., F.A.C.C.

## 2016-04-10 DIAGNOSIS — E559 Vitamin D deficiency, unspecified: Secondary | ICD-10-CM | POA: Diagnosis not present

## 2016-04-10 DIAGNOSIS — N183 Chronic kidney disease, stage 3 (moderate): Secondary | ICD-10-CM | POA: Diagnosis not present

## 2016-04-10 DIAGNOSIS — I480 Paroxysmal atrial fibrillation: Secondary | ICD-10-CM | POA: Diagnosis not present

## 2016-04-10 DIAGNOSIS — E039 Hypothyroidism, unspecified: Secondary | ICD-10-CM | POA: Diagnosis not present

## 2016-04-13 ENCOUNTER — Ambulatory Visit (INDEPENDENT_AMBULATORY_CARE_PROVIDER_SITE_OTHER): Payer: BLUE CROSS/BLUE SHIELD | Admitting: *Deleted

## 2016-04-13 DIAGNOSIS — R0600 Dyspnea, unspecified: Secondary | ICD-10-CM

## 2016-04-13 DIAGNOSIS — I4891 Unspecified atrial fibrillation: Secondary | ICD-10-CM | POA: Diagnosis not present

## 2016-04-13 NOTE — Progress Notes (Signed)
Patient calling back to see if Dr. [name] suggest anything different.  Advised patient to go ahead with previous plan & go to her visit with Dr. [name] tomorrow.  Informed patient that nurse will call back before end of day if provider suggest anything different.  She verbalized understanding.

## 2016-04-13 NOTE — Progress Notes (Signed)
Patient here for EKG.  See scanned into EPIC.  Patient stated that Dr. Charlette Caffey decreased her Levothyroxine to 150mcg from 124mcg.

## 2016-04-14 ENCOUNTER — Encounter: Payer: Self-pay | Admitting: Internal Medicine

## 2016-04-14 ENCOUNTER — Ambulatory Visit (INDEPENDENT_AMBULATORY_CARE_PROVIDER_SITE_OTHER): Payer: BLUE CROSS/BLUE SHIELD | Admitting: Internal Medicine

## 2016-04-14 VITALS — BP 118/72 | HR 148 | Ht 67.0 in | Wt 299.0 lb

## 2016-04-14 DIAGNOSIS — E669 Obesity, unspecified: Secondary | ICD-10-CM | POA: Diagnosis not present

## 2016-04-14 DIAGNOSIS — I4891 Unspecified atrial fibrillation: Secondary | ICD-10-CM

## 2016-04-14 MED ORDER — CARVEDILOL 12.5 MG PO TABS: 12.5000 mg | ORAL_TABLET | Freq: Two times a day (BID) | ORAL | Status: DC

## 2016-04-14 MED ORDER — CARVEDILOL 25 MG PO TABS: 25.0000 mg | ORAL_TABLET | Freq: Two times a day (BID) | ORAL | Status: DC

## 2016-04-14 NOTE — Progress Notes (Signed)
PCP: Doree Albee, MD Primary Cardiologist:  Dr Lisabeth Register Isabella Hall is a 60 y.o. female who presents today for electrophysiology followup.  She has been noncompliant with EP follow-up over the past year.  She recently returned to see Dr [name] and was noted to have AF with RVR.  She reports recent excessive thyroid supplementation.  Unfortunately, she has not made any real progress with lifestyle modification.  She states that she is trying to lose weight, but without furver.  She has preserved EF by echo done by PCP and no real CHF symptoms.  She does have fatigue and weight related SOB.  Today, she denies symptoms of chest pain,  lower extremity edema, dizziness, presyncope, or syncope.  The patient is otherwise without complaint today.   Past Medical History  Diagnosis Date  . Essential hypertension   . Hypothyroidism   . Atrial fibrillation (HCC)      CHADSVASC score 4  . Obesity   . Atrial flutter ([place])   . Type 2 diabetes mellitus [facility])    Past Surgical History  Procedure Laterality Date  . Cholecystectomy  1979  . Tubal ligation  1980  . Tee without cardioversion N/A 01/25/2015    Procedure: TRANSESOPHAGEAL ECHOCARDIOGRAM (TEE);  Surgeon: Satira Sark, MD;  Location: [facility];  Service: Cardiovascular;  Laterality: N/A;  . Cardioversion N/A 01/25/2015    Procedure: CARDIOVERSION;  Surgeon: Satira Sark, MD;  Location: [facility];  Service: Cardiovascular;  Laterality: N/A;  . Left heart catheterization with coronary angiogram N/A 01/27/2015    Procedure: LEFT HEART CATHETERIZATION WITH CORONARY ANGIOGRAM;  Surgeon: Sinclair Grooms, MD;  Location: [facility] CATH LAB;  Service: Cardiovascular;  Laterality: N/A;    ROS- all systems are reviewed and negatives except as per HPI above  Current Outpatient Prescriptions  Medication Sig Dispense Refill  . carvedilol (COREG) 6.25 MG tablet Take 1 tablet (6.25 mg total) by mouth 2 (two) times daily with a meal. 15 tablet 0  .  diltiazem (CARDIZEM CD) 120 MG 24 hr capsule Take 1 capsule (120 mg total) by mouth every morning. 30 capsule 6  . furosemide (LASIX) 20 MG tablet Take 1 tablet (20 mg total) by mouth daily. 30 tablet 6  . GARLIC PO Take 1 tablet by mouth daily.    Marland Kitchen levothyroxine (SYNTHROID, LEVOTHROID) 100 MCG tablet Take 100 mcg by mouth daily.    . Multiple Vitamin (MULTIVITAMIN WITH MINERALS) TABS tablet Take 1 tablet by mouth daily.    . potassium chloride (K-DUR) 10 MEQ tablet Take 1 tablet (10 mEq total) by mouth daily. 30 tablet 6  . sodium chloride (OCEAN) 0.65 % nasal spray Place 1 spray into both nostrils 3 (three) times daily as needed. Uses daily, up to three times per day during allergy season    . XARELTO 20 MG TABS tablet TAKE ONE TABLET BY MOUTH ONCE DAILY WITH SUPPER 30 tablet 11   No current facility-administered medications for this visit.    Physical Exam: Filed Vitals:   04/14/16 0844  BP: 118/72  Pulse: 148  Height: 5\' 7"  (1.702 m)  Weight: 299 lb (135.626 kg)    GEN- The patient is morbidly obese, alert and oriented x 3 today.   Head- normocephalic, atraumatic Eyes-  Sclera clear, conjunctiva pink Ears- hearing intact Oropharynx- clear Lungs- Clear to ausculation bilaterally, normal work of breathing Heart- irregular rate and rhythm, no murmurs, rubs or gallops, PMI not laterally displaced GI- soft, NT,  ND, + BS Extremities- no clubbing, cyanosis, or edema  Assessment and Plan:  1. Persistent afib with RVR Given obesity, prolonged afib, and compliance issues, I worry that our ability to achieve and maintain sinus rhythm long term is very low. She does not wish to resume amiodarone. Given compliance concerns, I would not advise tikosyn presently. Given obesity, she is not a candidate for ablation. I would therefore advise rate control until she exhibits the intent to comply with medical and lifestyle therapies. The importance of lifestyle change was discussed today. I  have increased coreg to 25 mg BID  2. Nonischemic CM- resolved per recent echo Likely tachycardia mediated  3. HTN Stable No change required today  4. Morbid obesity Body mass index is 46.82 kg/(m^2). As above I have strongly advised that she be considered for bariatric surgery previously.  This should be further discussed by primary cardiologist.  Follow-up with Dr [name] as scheduled I will see when needed going forward  Thompson Grayer MD, [facility] 04/14/2016 9:03 AM

## 2016-04-14 NOTE — Patient Instructions (Addendum)
Medication Instructions:  Your physician has recommended you make the following change in your medication:  1) Increase Carvedilol to 25 mg twice daily   Labwork: None ordered   Testing/Procedures: None ordered   Follow-Up: Your physician recommends that you schedule a follow-up appointment in: as scheduled with Dr [name] and as needed with Dr [name]   Any Other Special Instructions Will Be Listed Below (If Applicable).     If you need a refill on your cardiac medications before your next appointment, please call your pharmacy.

## 2016-04-26 ENCOUNTER — Ambulatory Visit (INDEPENDENT_AMBULATORY_CARE_PROVIDER_SITE_OTHER): Payer: BLUE CROSS/BLUE SHIELD | Admitting: Cardiovascular Disease

## 2016-04-26 ENCOUNTER — Encounter: Payer: Self-pay | Admitting: Cardiovascular Disease

## 2016-04-26 VITALS — BP 126/86 | HR 92 | Ht 67.0 in | Wt 298.0 lb

## 2016-04-26 DIAGNOSIS — R0601 Orthopnea: Secondary | ICD-10-CM | POA: Diagnosis not present

## 2016-04-26 DIAGNOSIS — I429 Cardiomyopathy, unspecified: Secondary | ICD-10-CM | POA: Diagnosis not present

## 2016-04-26 DIAGNOSIS — I4891 Unspecified atrial fibrillation: Secondary | ICD-10-CM | POA: Diagnosis not present

## 2016-04-26 DIAGNOSIS — I428 Other cardiomyopathies: Secondary | ICD-10-CM

## 2016-04-26 DIAGNOSIS — I10 Essential (primary) hypertension: Secondary | ICD-10-CM

## 2016-04-26 MED ORDER — DILTIAZEM HCL ER COATED BEADS 240 MG PO CP24: 240.0000 mg | ORAL_CAPSULE | Freq: Every day | ORAL | Status: DC

## 2016-04-26 NOTE — Patient Instructions (Signed)
Your physician recommends that you schedule a follow-up appointment in: [place]  Your physician has recommended you make the following change in your medication:   INCREASE DILTIAZEM 240 MG DAILY  Thank you for choosing [facility]!!

## 2016-04-26 NOTE — Progress Notes (Signed)
Patient ID: Isabella Hall, female   DOB: Dec 26, 1955, 60 y.o.   MRN: [id number]      SUBJECTIVE: The patient presents for follow-up for rapid atrial fibrillation. She saw Dr. [name] 04/14/16. He mentioned she has been noncompliant with EP follow-up and has not made any real progress with lifestyle modification. He also stated given her obesity, prolonged atrial fibrillation, and compliance issues, the ability to achieve and maintain sinus rhythm long-term was very low. She did not wish to resume amiodarone and given compliance concerns, he did not advise Tikosyn. Given her obesity, she is not a candidate for ablation. He ultimately recommended rate control and increased Coreg to 25 mg twice daily. He also recommended bariatric surgery.  Says she is feeling much better. Continues to try to lose weight with diet and exercise and gave me details about her current diet and exercise regimen.   Review of Systems: As per "subjective", otherwise negative.  No Known Allergies  Current Outpatient Prescriptions  Medication Sig Dispense Refill  . carvedilol (COREG) 25 MG tablet Take 1 tablet (25 mg total) by mouth 2 (two) times daily with a meal. 180 tablet 3  . diltiazem (CARDIZEM CD) 120 MG 24 hr capsule Take 1 capsule (120 mg total) by mouth every morning. 30 capsule 6  . furosemide (LASIX) 20 MG tablet Take 1 tablet (20 mg total) by mouth daily. 30 tablet 6  . GARLIC PO Take 1 tablet by mouth daily.    Marland Kitchen levothyroxine (SYNTHROID, LEVOTHROID) 100 MCG tablet Take 100 mcg by mouth daily.    . Multiple Vitamin (MULTIVITAMIN WITH MINERALS) TABS tablet Take 1 tablet by mouth daily.    . potassium chloride (K-DUR) 10 MEQ tablet Take 1 tablet (10 mEq total) by mouth daily. 30 tablet 6  . sodium chloride (OCEAN) 0.65 % nasal spray Place 1 spray into both nostrils 3 (three) times daily as needed. Uses daily, up to three times per day during allergy season    . XARELTO 20 MG TABS tablet TAKE ONE TABLET BY MOUTH  ONCE DAILY WITH SUPPER 30 tablet 11   No current facility-administered medications for this visit.    Past Medical History  Diagnosis Date  . Essential hypertension   . Hypothyroidism   . Atrial fibrillation (HCC)      CHADSVASC score 4  . Obesity   . Atrial flutter ([place])   . Type 2 diabetes mellitus [facility])     Past Surgical History  Procedure Laterality Date  . Cholecystectomy  1979  . Tubal ligation  1980  . Tee without cardioversion N/A 01/25/2015    Procedure: TRANSESOPHAGEAL ECHOCARDIOGRAM (TEE);  Surgeon: Satira Sark, MD;  Location: [facility];  Service: Cardiovascular;  Laterality: N/A;  . Cardioversion N/A 01/25/2015    Procedure: CARDIOVERSION;  Surgeon: Satira Sark, MD;  Location: [facility];  Service: Cardiovascular;  Laterality: N/A;  . Left heart catheterization with coronary angiogram N/A 01/27/2015    Procedure: LEFT HEART CATHETERIZATION WITH CORONARY ANGIOGRAM;  Surgeon: Sinclair Grooms, MD;  Location: [facility] CATH LAB;  Service: Cardiovascular;  Laterality: N/A;    Social History   Social History  . Marital Status: Married    Spouse Name: N/A  . Number of Children: N/A  . Years of Education: N/A   Occupational History  . Not on file.   Social History Main Topics  . Smoking status: Never Smoker   . Smokeless tobacco: Never Used  . Alcohol Use: No  .  Drug Use: No  . Sexual Activity: Not on file   Other Topics Concern  . Not on file   Social History Narrative     Filed Vitals:   04/26/16 1056 04/26/16 1108  BP: 130/90 126/86  Pulse: 92   Height: 5\' 7"  (1.702 m)   Weight: 298 lb (135.172 kg)   SpO2: 97%    HR by auscultation: 120-130 bpm.  PHYSICAL EXAM General: NAD HEENT: Normal. Neck: No JVD, no thyromegaly. Lungs: Clear to auscultation bilaterally with normal respiratory effort. CV: Tachycardic, irregular rhythm, normal S1/S2, no S3, no murmur. No pretibial or periankle edema.  Abdomen: Obese.  Neurologic: Alert and oriented x 3.   Psych: Normal affect. Skin: Normal. Musculoskeletal: No gross deformities. Extremities: No clubbing or cyanosis.    ECG: Most recent ECG reviewed.      ASSESSMENT AND PLAN: 1. Rapid atrial fibrillation: Will increase diltiazem to 240 mg q am. Continue Coreg 25 mg BID. Continue Xarelto.   2. 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 Continue Coreg. No longer on ACEI. Continue Lasix. Aim to control HR.  3. Essential HTN: Borderline DBP elevation. Needs additional weight loss. Monitor given increase in diltiazem dose.  4. Morbid obesity: Continues to lose weight with lifestyle modifications. Wt 298 lbs (314 lbs 11/29/15).  Dispo: fu 6 weeks.  Kate Sable, M.D., F.A.C.C.

## 2016-06-13 ENCOUNTER — Ambulatory Visit (INDEPENDENT_AMBULATORY_CARE_PROVIDER_SITE_OTHER): Payer: BLUE CROSS/BLUE SHIELD | Admitting: Cardiovascular Disease

## 2016-06-13 ENCOUNTER — Encounter: Payer: Self-pay | Admitting: Cardiovascular Disease

## 2016-06-13 VITALS — BP 110/74 | HR 89 | Ht 67.0 in | Wt 294.0 lb

## 2016-06-13 DIAGNOSIS — R0601 Orthopnea: Secondary | ICD-10-CM | POA: Diagnosis not present

## 2016-06-13 DIAGNOSIS — I429 Cardiomyopathy, unspecified: Secondary | ICD-10-CM

## 2016-06-13 DIAGNOSIS — I10 Essential (primary) hypertension: Secondary | ICD-10-CM

## 2016-06-13 DIAGNOSIS — I4891 Unspecified atrial fibrillation: Secondary | ICD-10-CM | POA: Diagnosis not present

## 2016-06-13 DIAGNOSIS — I428 Other cardiomyopathies: Secondary | ICD-10-CM

## 2016-06-13 DIAGNOSIS — I5032 Chronic diastolic (congestive) heart failure: Secondary | ICD-10-CM

## 2016-06-13 MED ORDER — DILTIAZEM HCL ER COATED BEADS 120 MG PO CP24: 120.0000 mg | ORAL_CAPSULE | Freq: Every evening | ORAL | Status: DC

## 2016-06-13 MED ORDER — DILTIAZEM HCL ER COATED BEADS 240 MG PO CP24: ORAL_CAPSULE | ORAL | Status: DC

## 2016-06-13 MED ORDER — DILTIAZEM HCL ER COATED BEADS 240 MG PO CP24: 240.0000 mg | ORAL_CAPSULE | Freq: Every day | ORAL | Status: DC

## 2016-06-13 NOTE — Addendum Note (Signed)
Addended by: Julian Hy T on: 06/13/2016 11:29 AM   Modules accepted: Orders, Medications

## 2016-06-13 NOTE — Progress Notes (Signed)
Patient ID: Isabella Hall, female   DOB: 1956/09/14, 60 y.o.   MRN: [id number]      SUBJECTIVE: The patient presents for follow-up of rapid atrial fibrillation. She saw Dr. [name] 04/14/16. He mentioned she has been noncompliant with EP follow-up and has not made any real progress with lifestyle modification. He also stated given her obesity, prolonged atrial fibrillation, and compliance issues, the ability to achieve and maintain sinus rhythm long-term was very low. She did not wish to resume amiodarone and given compliance concerns, he did not advise Tikosyn. Given her obesity, she is not a candidate for ablation. He ultimately recommended rate control and increased Coreg to 25 mg twice daily. He also recommended bariatric surgery.  I increased diltiazem dose at her last visit. She feels well. She splits up taking diltiazem and Coreg to avoid fatigue.  ECG performed in the office today which I personally interpreted demonstrated rapid atrial fibrillation, heart rate 134 bpm, with diffuse ST segment and T-wave abnormalities.   Review of Systems: As per "subjective", otherwise negative.  No Known Allergies  Current Outpatient Prescriptions  Medication Sig Dispense Refill  . carvedilol (COREG) 25 MG tablet Take 1 tablet (25 mg total) by mouth 2 (two) times daily with a meal. 180 tablet 3  . diltiazem (CARDIZEM CD) 240 MG 24 hr capsule Take 1 capsule (240 mg total) by mouth daily. 90 capsule 3  . furosemide (LASIX) 20 MG tablet Take 1 tablet (20 mg total) by mouth daily. 30 tablet 6  . GARLIC PO Take 1 tablet by mouth daily.    Marland Kitchen levothyroxine (SYNTHROID, LEVOTHROID) 100 MCG tablet Take 100 mcg by mouth daily.    . Multiple Vitamin (MULTIVITAMIN WITH MINERALS) TABS tablet Take 1 tablet by mouth daily.    . potassium chloride (K-DUR) 10 MEQ tablet Take 1 tablet (10 mEq total) by mouth daily. 30 tablet 6  . sodium chloride (OCEAN) 0.65 % nasal spray Place 1 spray into both nostrils 3 (three)  times daily as needed. Uses daily, up to three times per day during allergy season    . XARELTO 20 MG TABS tablet TAKE ONE TABLET BY MOUTH ONCE DAILY WITH SUPPER 30 tablet 11   No current facility-administered medications for this visit.    Past Medical History  Diagnosis Date  . Essential hypertension   . Hypothyroidism   . Atrial fibrillation (HCC)      CHADSVASC score 4  . Obesity   . Atrial flutter ([place])   . Type 2 diabetes mellitus [facility])     Past Surgical History  Procedure Laterality Date  . Cholecystectomy  1979  . Tubal ligation  1980  . Tee without cardioversion N/A 01/25/2015    Procedure: TRANSESOPHAGEAL ECHOCARDIOGRAM (TEE);  Surgeon: Satira Sark, MD;  Location: [facility];  Service: Cardiovascular;  Laterality: N/A;  . Cardioversion N/A 01/25/2015    Procedure: CARDIOVERSION;  Surgeon: Satira Sark, MD;  Location: [facility];  Service: Cardiovascular;  Laterality: N/A;  . Left heart catheterization with coronary angiogram N/A 01/27/2015    Procedure: LEFT HEART CATHETERIZATION WITH CORONARY ANGIOGRAM;  Surgeon: Sinclair Grooms, MD;  Location: [facility] CATH LAB;  Service: Cardiovascular;  Laterality: N/A;    Social History   Social History  . Marital Status: Married    Spouse Name: N/A  . Number of Children: N/A  . Years of Education: N/A   Occupational History  . Not on file.   Social History Main Topics  .  Smoking status: Never Smoker   . Smokeless tobacco: Never Used  . Alcohol Use: No  . Drug Use: No  . Sexual Activity: Not on file   Other Topics Concern  . Not on file   Social History Narrative     Filed Vitals:   06/13/16 1052  BP: 110/74  Pulse: 89  Height: 5\' 7"  (1.702 m)  Weight: 294 lb (133.358 kg)  SpO2: 97%    PHYSICAL EXAM General: NAD HEENT: Normal. Neck: No JVD, no thyromegaly. Lungs: Clear to auscultation bilaterally with normal respiratory effort. CV: Tachycardic, irregular rhythm, normal S1/S2, no S3, no murmur. No pretibial  or periankle edema.  Abdomen: Obese.  Neurologic: Alert and oriented x 3.  Psych: Normal affect. Skin: Normal. Musculoskeletal: No gross deformities. Extremities: No clubbing or cyanosis.   ECG: Most recent ECG reviewed.      ASSESSMENT AND PLAN: 1. Rapid atrial fibrillation: Will increase diltiazem to 240 mg q am and 120 mg q pm. Continue Coreg 25 mg BID. Continue Xarelto.   2. 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 Continue Coreg. No longer on ACEI. Continue Lasix. Aim to control HR.  3. Essential HTN: Controlled. No changes.  4. Morbid obesity: Continues to lose weight with lifestyle modifications. Wt 294 lbs (298 04/26/16).  Dispo: fu 3 months.  Kate Sable, M.D., F.A.C.C.

## 2016-06-13 NOTE — Patient Instructions (Signed)
Your physician recommends that you schedule a follow-up appointment in: 1 month with Dr. [name]  Your physician has recommended you make the following change in your medication  TAKE AN ADDITIONAL 120 MG OF DILTIAZEM IN THE EVENINGS.  Thank you for choosing [facility]!!

## 2016-07-03 ENCOUNTER — Observation Stay (HOSPITAL_COMMUNITY)
Admission: EM | Admit: 2016-07-03 | Discharge: 2016-07-05 | DRG: 309 | Disposition: A | Discharge status: Home / Self Care | Payer: BLUE CROSS/BLUE SHIELD | Attending: Internal Medicine | Admitting: Internal Medicine

## 2016-07-03 ENCOUNTER — Encounter (HOSPITAL_COMMUNITY): Payer: Self-pay | Admitting: *Deleted

## 2016-07-03 ENCOUNTER — Emergency Department (HOSPITAL_COMMUNITY): Payer: BLUE CROSS/BLUE SHIELD

## 2016-07-03 DIAGNOSIS — Z809 Family history of malignant neoplasm, unspecified: Secondary | ICD-10-CM | POA: Diagnosis not present

## 2016-07-03 DIAGNOSIS — E1122 Type 2 diabetes mellitus with diabetic chronic kidney disease: Secondary | ICD-10-CM | POA: Diagnosis not present

## 2016-07-03 DIAGNOSIS — N183 Chronic kidney disease, stage 3 (moderate): Secondary | ICD-10-CM | POA: Diagnosis present

## 2016-07-03 DIAGNOSIS — Z8249 Family history of ischemic heart disease and other diseases of the circulatory system: Secondary | ICD-10-CM | POA: Diagnosis not present

## 2016-07-03 DIAGNOSIS — E1165 Type 2 diabetes mellitus with hyperglycemia: Secondary | ICD-10-CM

## 2016-07-03 DIAGNOSIS — I4891 Unspecified atrial fibrillation: Secondary | ICD-10-CM | POA: Diagnosis present

## 2016-07-03 DIAGNOSIS — N1832 Chronic kidney disease, stage 3b: Secondary | ICD-10-CM | POA: Diagnosis present

## 2016-07-03 DIAGNOSIS — I481 Persistent atrial fibrillation: Principal | ICD-10-CM | POA: Diagnosis present

## 2016-07-03 DIAGNOSIS — Z9119 Patient's noncompliance with other medical treatment and regimen: Secondary | ICD-10-CM

## 2016-07-03 DIAGNOSIS — I129 Hypertensive chronic kidney disease with stage 1 through stage 4 chronic kidney disease, or unspecified chronic kidney disease: Secondary | ICD-10-CM | POA: Diagnosis not present

## 2016-07-03 DIAGNOSIS — R918 Other nonspecific abnormal finding of lung field: Secondary | ICD-10-CM | POA: Diagnosis not present

## 2016-07-03 DIAGNOSIS — I10 Essential (primary) hypertension: Secondary | ICD-10-CM | POA: Diagnosis present

## 2016-07-03 DIAGNOSIS — Z7901 Long term (current) use of anticoagulants: Secondary | ICD-10-CM

## 2016-07-03 DIAGNOSIS — R000 Tachycardia, unspecified: Secondary | ICD-10-CM | POA: Diagnosis not present

## 2016-07-03 DIAGNOSIS — E039 Hypothyroidism, unspecified: Secondary | ICD-10-CM | POA: Diagnosis not present

## 2016-07-03 DIAGNOSIS — Z9049 Acquired absence of other specified parts of digestive tract: Secondary | ICD-10-CM

## 2016-07-03 DIAGNOSIS — R531 Weakness: Secondary | ICD-10-CM | POA: Diagnosis not present

## 2016-07-03 DIAGNOSIS — Z6841 Body Mass Index (BMI) 40.0 and over, adult: Secondary | ICD-10-CM | POA: Diagnosis not present

## 2016-07-03 DIAGNOSIS — R911 Solitary pulmonary nodule: Secondary | ICD-10-CM

## 2016-07-03 DIAGNOSIS — E119 Type 2 diabetes mellitus without complications: Secondary | ICD-10-CM

## 2016-07-03 DIAGNOSIS — E032 Hypothyroidism due to medicaments and other exogenous substances: Secondary | ICD-10-CM | POA: Diagnosis not present

## 2016-07-03 LAB — COMPREHENSIVE METABOLIC PANEL
ALT: 26 U/L (ref 14–54)
AST: 24 U/L (ref 15–41)
Albumin: 3.6 g/dL (ref 3.5–5.0)
Alkaline Phosphatase: 55 U/L (ref 38–126)
Anion gap: 7 (ref 5–15)
BILIRUBIN TOTAL: 0.9 mg/dL (ref 0.3–1.2)
BUN: 29 mg/dL — AB (ref 6–20)
CHLORIDE: 100 mmol/L — AB (ref 101–111)
CO2: 27 mmol/L (ref 22–32)
CREATININE: 1.77 mg/dL — AB (ref 0.44–1.00)
Calcium: 8.6 mg/dL — ABNORMAL LOW (ref 8.9–10.3)
GFR calc Af Amer: 35 mL/min — ABNORMAL LOW (ref >60)
GFR, EST NON AFRICAN AMERICAN: 31 mL/min — AB (ref >60)
Glucose, Bld: 232 mg/dL — ABNORMAL HIGH (ref 65–99)
Potassium: 3.4 mmol/L — ABNORMAL LOW (ref 3.5–5.1)
Sodium: 134 mmol/L — ABNORMAL LOW (ref 135–145)
Total Protein: 7.5 g/dL (ref 6.5–8.1)

## 2016-07-03 LAB — URINALYSIS, ROUTINE W REFLEX MICROSCOPIC
Bilirubin Urine: NEGATIVE
GLUCOSE, UA: 100 mg/dL — AB
Hgb urine dipstick: NEGATIVE
KETONES UR: NEGATIVE mg/dL
NITRITE: NEGATIVE
Specific Gravity, Urine: 1.02 (ref 1.005–1.030)
pH: 5.5 (ref 5.0–8.0)

## 2016-07-03 LAB — CBC
HCT: 38.5 % (ref 36.0–46.0)
Hemoglobin: 12.4 g/dL (ref 12.0–15.0)
MCH: 26.3 pg (ref 26.0–34.0)
MCHC: 32.2 g/dL (ref 30.0–36.0)
MCV: 81.7 fL (ref 78.0–100.0)
Platelets: 143 10*3/uL — ABNORMAL LOW (ref 150–400)
RBC: 4.71 MIL/uL (ref 3.87–5.11)
RDW: 16.8 % — AB (ref 11.5–15.5)
WBC: 7 10*3/uL (ref 4.0–10.5)

## 2016-07-03 LAB — URINE MICROSCOPIC-ADD ON

## 2016-07-03 LAB — I-STAT TROPONIN, ED: TROPONIN I, POC: 0 ng/mL (ref 0.00–0.08)

## 2016-07-03 LAB — CBG MONITORING, ED: Glucose-Capillary: 225 mg/dL — ABNORMAL HIGH (ref 65–99)

## 2016-07-03 MED ORDER — SODIUM CHLORIDE 0.9 % IV BOLUS (SEPSIS)
500.0000 mL | Freq: Once | INTRAVENOUS | Status: AC
  Administered 2016-07-03: 500 mL via INTRAVENOUS

## 2016-07-03 MED ORDER — POTASSIUM CHLORIDE CRYS ER 20 MEQ PO TBCR
40.0000 meq | EXTENDED_RELEASE_TABLET | Freq: Once | ORAL | Status: AC
  Administered 2016-07-03: 40 meq via ORAL
  Filled 2016-07-03: qty 2

## 2016-07-03 MED ORDER — DILTIAZEM HCL 100 MG IV SOLR
5.0000 mg/h | INTRAVENOUS | Status: DC
  Administered 2016-07-03: 5 mg/h via INTRAVENOUS
  Filled 2016-07-03: qty 100

## 2016-07-03 MED ORDER — DILTIAZEM HCL 25 MG/5ML IV SOLN
10.0000 mg | Freq: Once | INTRAVENOUS | Status: AC
  Administered 2016-07-03: 10 mg via INTRAVENOUS
  Filled 2016-07-03: qty 5

## 2016-07-03 MED ORDER — DILTIAZEM LOAD VIA INFUSION
15.0000 mg | Freq: Once | INTRAVENOUS | Status: AC
  Administered 2016-07-03: 15 mg via INTRAVENOUS
  Filled 2016-07-03: qty 15

## 2016-07-03 NOTE — ED Notes (Signed)
Refused to put gown on.

## 2016-07-03 NOTE — ED Provider Notes (Signed)
CSN: [id number]     Arrival date & time 07/03/16  1938 History  By signing my name below, I, Georgette Shell, attest that this documentation has been prepared under the direction and in the presence of Jola Schmidt, MD. Electronically Signed: Georgette Shell, ED Scribe. 07/03/2016. 11:37 PM.   Chief Complaint  Patient presents with  . Altered Mental Status   The history is provided by the patient. No language interpreter was used.   HPI Comments: Isabella Hall is a 60 y.o. female with h/o A-fib, obesity, DM, and HTN who presents to the Emergency Department complaining of sudden onset, intermittent "swimmy" lightheadedness onset tonight. Per pt, the episodes last about a minute. Pt states she felt like she was about to pass out. She notes she just "doesn't feel right". Pt was driving home when symptoms came but notes these symptoms have gradually improved at this time. Pt is on Xarelto and is compliant with her medication. Pt has a cardiologist she regularly follows up with. Pt was hospitalized on 01/2016 and had a left heart catheterization. Pt denies chest pain, fevers, and chills.  Cardiologist: Clearnce Hasten  Past Medical History  Diagnosis Date  . Essential hypertension   . Hypothyroidism   . Atrial fibrillation (HCC)      CHADSVASC score 4  . Obesity   . Atrial flutter ([place])   . Type 2 diabetes mellitus [facility])    Past Surgical History  Procedure Laterality Date  . Cholecystectomy  1979  . Tubal ligation  1980  . Tee without cardioversion N/A 01/25/2015    Procedure: TRANSESOPHAGEAL ECHOCARDIOGRAM (TEE);  Surgeon: Satira Sark, MD;  Location: [facility];  Service: Cardiovascular;  Laterality: N/A;  . Cardioversion N/A 01/25/2015    Procedure: CARDIOVERSION;  Surgeon: Satira Sark, MD;  Location: [facility];  Service: Cardiovascular;  Laterality: N/A;  . Left heart catheterization with coronary angiogram N/A 01/27/2015    Procedure: LEFT HEART CATHETERIZATION WITH CORONARY ANGIOGRAM;  Surgeon: Sinclair Grooms, MD;  Location: [facility] CATH LAB;  Service: Cardiovascular;  Laterality: N/A;   Family History  Problem Relation Age of Onset  . Atrial fibrillation Neg Hx   . Diabetes Neg Hx   . Hypertension    . Coronary artery disease Sister     coronary stents  . Cancer Mother   . Heart disease Father   . Cancer Maternal Grandmother    Social History  Substance Use Topics  . Smoking status: Never Smoker   . Smokeless tobacco: Never Used  . Alcohol Use: No   OB History    No data available     Review of Systems A complete 10 system review of systems was obtained and all systems are negative except as noted in the HPI and PMH.   Allergies  Review of patient's allergies indicates no known allergies.  Home Medications   Prior to Admission medications   Medication Sig Start Date End Date Taking? Authorizing Provider  carvedilol (COREG) 25 MG tablet Take 1 tablet (25 mg total) by mouth 2 (two) times daily with a meal. 04/14/16  Yes Thompson Grayer, MD  diltiazem (CARDIZEM CD) 120 MG 24 hr capsule Take 1 capsule (120 mg total) by mouth every evening. 06/13/16  Yes Herminio Commons, MD  diltiazem (CARDIZEM CD) 240 MG 24 hr capsule Take 1 capsule (240 mg total) by mouth daily. Patient taking differently: Take 240 mg by mouth every morning.  06/13/16  Yes Herminio Commons, MD  furosemide (LASIX) 20 MG tablet Take 1 tablet (20 mg total) by mouth daily. 04/06/16  Yes Herminio Commons, MD  GARLIC PO Take 1 tablet by mouth daily.   Yes Historical Provider, MD  levothyroxine (SYNTHROID, LEVOTHROID) 100 MCG tablet Take 100 mcg by mouth daily.   Yes Historical Provider, MD  Multiple Vitamin (MULTIVITAMIN WITH MINERALS) TABS tablet Take 1 tablet by mouth daily.   Yes Historical Provider, MD  potassium chloride (K-DUR) 10 MEQ tablet Take 1 tablet (10 mEq total) by mouth daily. 04/06/16  Yes Herminio Commons, MD  sodium chloride (OCEAN) 0.65 % nasal spray Place 1 spray into both nostrils 3  (three) times daily as needed. Uses daily, up to three times per day during allergy season   Yes Historical Provider, MD  XARELTO 20 MG TABS tablet TAKE ONE TABLET BY MOUTH ONCE DAILY WITH SUPPER 12/07/15  Yes Herminio Commons, MD   BP 133/100 mmHg  Pulse 78  Temp(Src) 97.9 F (36.6 C) (Oral)  Resp 17  Ht 5\' 6"  (1.676 m)  Wt 280 lb (127.007 kg)  BMI 45.21 kg/m2  SpO2 96% Physical Exam  Constitutional: She is oriented to person, place, and time. She appears well-developed and well-nourished. No distress.  HENT:  Head: Normocephalic and atraumatic.  Eyes: EOM are normal.  Neck: Normal range of motion.  Cardiovascular: Normal rate and normal heart sounds.  A regularly irregular rhythm present.  Pulmonary/Chest: Effort normal and breath sounds normal. She has no wheezes. She has no rales.  Abdominal: Soft. She exhibits no distension. There is no tenderness.  Musculoskeletal: Normal range of motion.  Neurological: She is alert and oriented to person, place, and time.  Skin: Skin is warm and dry.  Psychiatric: She has a normal mood and affect. Judgment normal.  Nursing note and vitals reviewed.   ED Course  Procedures   +++++++++++++++++++++++++++++++++++++++++++++++++++++++++++++++++  CRITICAL CARE Performed by: Hoy Morn Total critical care time: 30 minutes Critical care time was exclusive of separately billable procedures and treating other patients. Critical care was necessary to treat or prevent imminent or life-threatening deterioration. Critical care was time spent personally by me on the following activities: development of treatment plan with patient and/or surrogate as well as nursing, discussions with consultants, evaluation of patient's response to treatment, examination of patient, obtaining history from patient or surrogate, ordering and performing treatments and interventions, ordering and review of laboratory studies, ordering and review of radiographic  studies, pulse oximetry and re-evaluation of patient's condition.  ++++++++++++++++++++++++++++++++++++++++++++++++++++++++++++++++   DIAGNOSTIC STUDIES: Oxygen Saturation is 96% on RA, adequate by my interpretation.    COORDINATION OF CARE: 11:31 PM Discussed treatment plan with pt at bedside which includes cardiac monitoring and pt agreed to plan.  Labs Review Labs Reviewed  COMPREHENSIVE METABOLIC PANEL - Abnormal; Notable for the following:    Sodium 134 (*)    Potassium 3.4 (*)    Chloride 100 (*)    Glucose, Bld 232 (*)    BUN 29 (*)    Creatinine, Ser 1.77 (*)    Calcium 8.6 (*)    GFR calc non Af Amer 31 (*)    GFR calc Af Amer 35 (*)    All other components within normal limits  CBC - Abnormal; Notable for the following:    RDW 16.8 (*)    Platelets 143 (*)    All other components within normal limits  URINALYSIS, ROUTINE W REFLEX MICROSCOPIC (NOT AT [facility]) - Abnormal; Notable for the  following:    Glucose, UA 100 (*)    Protein, ur TRACE (*)    Leukocytes, UA TRACE (*)    All other components within normal limits  URINE MICROSCOPIC-ADD ON - Abnormal; Notable for the following:    Squamous Epithelial / LPF 0-5 (*)    Bacteria, UA FEW (*)    Casts HYALINE CASTS (*)    All other components within normal limits  CBG MONITORING, ED - Abnormal; Notable for the following:    Glucose-Capillary 225 (*)    All other components within normal limits  I-STAT TROPOININ, ED    Imaging Review No results found. I have personally reviewed and evaluated these images and lab results as part of my medical decision-making.   EKG Interpretation   Date/Time:  Monday July 03 2016 22:20:15 EDT Ventricular Rate:  144 PR Interval:    QRS Duration: 101 QT Interval:  325 QTC Calculation: 503 R Axis:   75 Text Interpretation:  Atrial fibrillation Ventricular premature complex  Nonspecific repol abnormality, diffuse leads Baseline wander in lead(s) I  II III aVR aVL aVF V1 V2 No  significant change was found Confirmed by  Camelle Henkels  MD, Aki Burdin ([id number]) on 07/04/2016 1:12:45 AM      MDM   Final diagnoses:  Atrial fibrillation with rapid ventricular response [facility])    Patient with known atrial fibrillation on anticoagulation.  Presenting with A. fib with RVR and symptomatic from this.  Initial dose of Cardizem did not rate control her.  She is requiring IV Cardizem and IV Cardizem drip at this time to maintain rate. HR still 105-121. Will increase drip at this time and repeat bolus   I personally performed the services described in this documentation, which was scribed in my presence. The recorded information has been reviewed and is accurate.       Jola Schmidt, MD 07/04/16 [phone]

## 2016-07-03 NOTE — ED Notes (Signed)
Pt brought in by [facility] for c/o "not feeling well" that started today at around 1830; pt states she "felt like I was going to have a seizure"; pt denies any pain

## 2016-07-03 NOTE — ED Notes (Signed)
Spoke with pt's family member and she expressed concern regarding pt's potassium and weakened state; this nurse told family member that pt's vital signs were stable and that the lab would come draw some blood; pt's family member verbalized understanding and went out to waiting area to inform patient

## 2016-07-04 ENCOUNTER — Inpatient Hospital Stay (HOSPITAL_COMMUNITY): Payer: BLUE CROSS/BLUE SHIELD

## 2016-07-04 ENCOUNTER — Encounter (HOSPITAL_COMMUNITY): Payer: Self-pay | Admitting: Internal Medicine

## 2016-07-04 DIAGNOSIS — I4891 Unspecified atrial fibrillation: Secondary | ICD-10-CM | POA: Diagnosis not present

## 2016-07-04 DIAGNOSIS — I10 Essential (primary) hypertension: Secondary | ICD-10-CM | POA: Diagnosis not present

## 2016-07-04 DIAGNOSIS — E039 Hypothyroidism, unspecified: Secondary | ICD-10-CM

## 2016-07-04 DIAGNOSIS — R918 Other nonspecific abnormal finding of lung field: Secondary | ICD-10-CM | POA: Diagnosis not present

## 2016-07-04 DIAGNOSIS — R531 Weakness: Secondary | ICD-10-CM | POA: Diagnosis not present

## 2016-07-04 LAB — TSH: TSH: 13.416 u[IU]/mL — ABNORMAL HIGH (ref 0.350–4.500)

## 2016-07-04 LAB — RAPID URINE DRUG SCREEN, HOSP PERFORMED
AMPHETAMINES: NOT DETECTED
Barbiturates: NOT DETECTED
Benzodiazepines: NOT DETECTED
Cocaine: NOT DETECTED
OPIATES: NOT DETECTED
Tetrahydrocannabinol: NOT DETECTED

## 2016-07-04 LAB — GLUCOSE, CAPILLARY
GLUCOSE-CAPILLARY: 165 mg/dL — AB (ref 65–99)
Glucose-Capillary: 238 mg/dL — ABNORMAL HIGH (ref 65–99)
Glucose-Capillary: 195 mg/dL — ABNORMAL HIGH (ref 65–99)

## 2016-07-04 LAB — BASIC METABOLIC PANEL
ANION GAP: 9 (ref 5–15)
BUN: 26 mg/dL — ABNORMAL HIGH (ref 6–20)
CALCIUM: 8.5 mg/dL — AB (ref 8.9–10.3)
CHLORIDE: 104 mmol/L (ref 101–111)
CO2: 27 mmol/L (ref 22–32)
CREATININE: 1.6 mg/dL — AB (ref 0.44–1.00)
GFR calc non Af Amer: 34 mL/min — ABNORMAL LOW (ref >60)
GFR, EST AFRICAN AMERICAN: 40 mL/min — AB (ref >60)
Glucose, Bld: 176 mg/dL — ABNORMAL HIGH (ref 65–99)
Potassium: 4 mmol/L (ref 3.5–5.1)
SODIUM: 140 mmol/L (ref 135–145)

## 2016-07-04 LAB — MRSA PCR SCREENING: MRSA BY PCR: NEGATIVE

## 2016-07-04 MED ORDER — INSULIN ASPART 100 UNIT/ML ~~LOC~~ SOLN: 0.0000 [IU] | Freq: Every day | SUBCUTANEOUS | Status: DC

## 2016-07-04 MED ORDER — DILTIAZEM HCL 30 MG PO TABS
90.0000 mg | ORAL_TABLET | Freq: Four times a day (QID) | ORAL | Status: DC
  Administered 2016-07-04 – 2016-07-05 (×4): 90 mg via ORAL
  Filled 2016-07-04 (×5): qty 1

## 2016-07-04 MED ORDER — SODIUM CHLORIDE 0.9% FLUSH
3.0000 mL | Freq: Two times a day (BID) | INTRAVENOUS | Status: DC
  Administered 2016-07-04 – 2016-07-05 (×4): 3 mL via INTRAVENOUS

## 2016-07-04 MED ORDER — ZOLPIDEM TARTRATE 5 MG PO TABS: 5.0000 mg | ORAL_TABLET | Freq: Every evening | ORAL | Status: DC | PRN

## 2016-07-04 MED ORDER — ACETAMINOPHEN 325 MG PO TABS: 650.0000 mg | ORAL_TABLET | ORAL | Status: DC | PRN

## 2016-07-04 MED ORDER — SODIUM CHLORIDE 0.9% FLUSH: 3.0000 mL | INTRAVENOUS | Status: DC | PRN

## 2016-07-04 MED ORDER — SODIUM CHLORIDE 0.9 % IV SOLN: 250.0000 mL | INTRAVENOUS | Status: DC | PRN

## 2016-07-04 MED ORDER — INSULIN ASPART 100 UNIT/ML ~~LOC~~ SOLN
0.0000 [IU] | Freq: Three times a day (TID) | SUBCUTANEOUS | Status: DC
  Administered 2016-07-04: 3 [IU] via SUBCUTANEOUS
  Administered 2016-07-04 – 2016-07-05 (×2): 2 [IU] via SUBCUTANEOUS

## 2016-07-04 MED ORDER — DILTIAZEM HCL 100 MG IV SOLR
5.0000 mg/h | INTRAVENOUS | Status: DC
  Administered 2016-07-04: 10 mg/h via INTRAVENOUS
  Filled 2016-07-04: qty 100

## 2016-07-04 MED ORDER — DILTIAZEM HCL 25 MG/5ML IV SOLN
10.0000 mg | Freq: Once | INTRAVENOUS | Status: AC
  Administered 2016-07-04: 10 mg via INTRAVENOUS

## 2016-07-04 MED ORDER — CARVEDILOL 12.5 MG PO TABS
25.0000 mg | ORAL_TABLET | Freq: Two times a day (BID) | ORAL | Status: DC
  Administered 2016-07-04 – 2016-07-05 (×3): 25 mg via ORAL
  Filled 2016-07-04 (×3): qty 2

## 2016-07-04 MED ORDER — LEVOTHYROXINE SODIUM 100 MCG PO TABS
100.0000 ug | ORAL_TABLET | Freq: Every day | ORAL | Status: DC
  Administered 2016-07-04 – 2016-07-05 (×2): 100 ug via ORAL
  Filled 2016-07-04 (×2): qty 1

## 2016-07-04 MED ORDER — RIVAROXABAN 20 MG PO TABS
20.0000 mg | ORAL_TABLET | Freq: Every day | ORAL | Status: DC
  Administered 2016-07-04: 20 mg via ORAL
  Filled 2016-07-04: qty 1

## 2016-07-04 MED ORDER — ONDANSETRON HCL 4 MG/2ML IJ SOLN: 4.0000 mg | Freq: Four times a day (QID) | INTRAMUSCULAR | Status: DC | PRN

## 2016-07-04 NOTE — ED Notes (Signed)
Patient transported to X-ray 

## 2016-07-04 NOTE — Progress Notes (Signed)
Inpatient Diabetes Program Recommendations  AACE/ADA: New Consensus Statement on Inpatient Glycemic Control (2015)  Target Ranges:  Prepandial:   less than 140 mg/dL      Peak postprandial:   less than 180 mg/dL (1-2 hours)      Critically ill patients:  140 - 180 mg/dL  Results for Isabella Hall, Isabella Hall (MRN [id number]) as of 07/04/2016 08:39  Ref. Range 07/03/2016 21:38 07/04/2016 04:42  Glucose Latest Ref Range: 65-99 mg/dL 232 (H) 176 (H)   Results for Isabella Hall, Isabella Hall (MRN [id number]) as of 07/04/2016 08:39  Ref. Range 07/03/2016 22:31  Glucose-Capillary Latest Ref Range: 65-99 mg/dL 225 (H)   Review of Glycemic Control  Diabetes history: DM2 Outpatient Diabetes medications: None Current orders for Inpatient glycemic control: None  Inpatient Diabetes Program Recommendations: Correction (SSI): While inpatient, please consider ordering CBGs with Novolog correction scale ACHS. HgbA1C: A1C in process.  Thanks, Barnie Alderman, RN, MSN, CDE Diabetes Coordinator Inpatient Diabetes Program [phone] (Team Pager from [place] to [place]) [phone] ([facility] office) [phone] [facility] office) [phone] [facility] office)

## 2016-07-04 NOTE — Progress Notes (Signed)
Patient is a hospital earlier this morning by Dr. Lorin Mercy.  Patient seen and examined. She is feeling better. She is not having any lightheadedness, chest pain or palpitations. Lungs are clear, heart sounds are irregular, no pedal edema  She has been admitted with atrial fibrillation with rapid ventricular response. Currently on a diltiazem infusion. She is continued on beta blockers. We'll try and wean her off. Has been fusion by starting oral diltiazem. She is anticoagulated. Cardiology following, appreciate assistance.  The patient has elevated TSH. She reports that her Synthroid dose was recently adjusted. Case discussed with her PCP, Dr. Anastasio Champion who will follow up the patient. The patient has been taking her Synthroid before breakfast, but taking with other medications which may be affecting absorption. I have advised her to take her Synthroid alone on an empty stomach. Dr. Anastasio Champion has requested that we keep her dose at 175mcg daily and he will follow her up in the next few weeks.  Discharge home once heart rate is controlled.  Isabella Hall

## 2016-07-04 NOTE — H&P (Signed)
History and Physical    IRMALEE Hall [id number] DOB: [birth year] DOA: 07/03/2016  PCP: Doree Albee, MD Consultants:  Arlyce Dice - cardiology Patient coming from: home, lives alone  Chief Complaint: atrial fibrillation  HPI: Isabella Hall is a 60 y.o. female with medical history significant of DM, HTN, and refractory afib with RVR,  Patient reports feeling swimmy-headed, thinking her potassium was low, and sudden onset afib.  Was driving home from work about 1830 and had to pull over and call 911.  Has been in afib for several months and they are giving her medication to try to get it stop.  She did get cardioverted during last hospitalization; lasted about 10 days and then went back into afib about 3 months ago.  Has been on Xarelto, Lasix, Cardizem, and Coreg since.  She reports absolute compliance with medication.  She did take an extra potassium today but it was still low when she got here.  She did feel a little swimmy headed yesterday about the same time.   ED Course: known afib on anticoagulation; RVR tonight; required Cardizem bolus and drip to 10.  Review of Systems: As per HPI; otherwise 10 point review of systems reviewed and negative.   Ambulatory Status:  Ambulatory without assistance  Past Medical History  Diagnosis Date  . Essential hypertension   . Hypothyroidism   . Atrial fibrillation (HCC)      CHADSVASC score 4  . Obesity   . Atrial flutter ([place])   . Type 2 diabetes mellitus [facility])     Past Surgical History  Procedure Laterality Date  . Cholecystectomy  1979  . Tubal ligation  1980  . Tee without cardioversion N/A 01/25/2015    Procedure: TRANSESOPHAGEAL ECHOCARDIOGRAM (TEE);  Surgeon: Satira Sark, MD;  Location: [facility];  Service: Cardiovascular;  Laterality: N/A;  . Cardioversion N/A 01/25/2015    Procedure: CARDIOVERSION;  Surgeon: Satira Sark, MD;  Location: [facility];  Service: Cardiovascular;  Laterality: N/A;  . Left heart catheterization  with coronary angiogram N/A 01/27/2015    Procedure: LEFT HEART CATHETERIZATION WITH CORONARY ANGIOGRAM;  Surgeon: Sinclair Grooms, MD;  Location: [facility] CATH LAB;  Service: Cardiovascular;  Laterality: N/A;    Social History   Social History  . Marital Status: Married    Spouse Name: N/A  . Number of Children: N/A  . Years of Education: N/A   Occupational History  . 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 Smoking status: Never Smoker   . Smokeless tobacco: Never Used  . Alcohol Use: No  . Drug Use: No  . Sexual Activity: Yes    Birth Control/ Protection: Surgical   Other Topics Concern  . Not on file   Social History Narrative    No Known Allergies  Family History  Problem Relation Age of Onset  . Atrial fibrillation Neg Hx   . Diabetes Neg Hx   . Hypertension    . Coronary artery disease Sister     coronary stents  . Cancer Mother   . Heart disease Father   . Cancer Maternal Grandmother     Prior to Admission medications   Medication Sig Start Date End Date Taking? Authorizing Provider  carvedilol (COREG) 25 MG tablet Take 1 tablet (25 mg total) by mouth 2 (two) times daily with a meal. 04/14/16  Yes Thompson Grayer, MD  diltiazem (CARDIZEM CD) 120 MG 24 hr capsule Take 1  capsule (120 mg total) by mouth every evening. 06/13/16  Yes Herminio Commons, MD  diltiazem (CARDIZEM CD) 240 MG 24 hr capsule Take 1 capsule (240 mg total) by mouth daily. Patient taking differently: Take 240 mg by mouth every morning.  06/13/16  Yes Herminio Commons, MD  furosemide (LASIX) 20 MG tablet Take 1 tablet (20 mg total) by mouth daily. 04/06/16  Yes Herminio Commons, MD  GARLIC PO Take 1 tablet by mouth daily.   Yes Historical Provider, MD  levothyroxine (SYNTHROID, LEVOTHROID) 100 MCG tablet Take 100 mcg by mouth daily.   Yes Historical Provider, MD  Multiple Vitamin (MULTIVITAMIN WITH MINERALS) TABS tablet Take 1 tablet by mouth daily.   Yes  Historical Provider, MD  potassium chloride (K-DUR) 10 MEQ tablet Take 1 tablet (10 mEq total) by mouth daily. 04/06/16  Yes Herminio Commons, MD  sodium chloride (OCEAN) 0.65 % nasal spray Place 1 spray into both nostrils 3 (three) times daily as needed. Uses daily, up to three times per day during allergy season   Yes Historical Provider, MD  XARELTO 20 MG TABS tablet TAKE ONE TABLET BY MOUTH ONCE DAILY WITH SUPPER 12/07/15  Yes Herminio Commons, MD    Physical Exam: Filed Vitals:   07/04/16 0115 07/04/16 0130 07/04/16 0145 07/04/16 0200  BP:  114/89  113/81  Pulse: 135 43 34 37  Temp:      TempSrc:      Resp: 20 21 19 19   Height:      Weight:      SpO2: 99% 98% 97% 98%     General: Appears calm and comfortable and is NAD Eyes:  PERRL, EOMI, normal lids, iris ENT:  grossly normal hearing, lips & tongue, mmm Neck:  no LAD, masses or thyromegaly Cardiovascular:  irregularly irregular no m/r/g. No LE edema.  Respiratory:  CTA bilaterally, no w/r/r. Normal respiratory effort. Abdomen:  soft, ntnd, NABS Skin:  no rash or induration seen on limited exam Musculoskeletal:  grossly normal tone BUE/BLE, good ROM, no bony abnormality Psychiatric:  grossly normal mood and affect, speech fluent and appropriate, AOx3 Neurologic:  CN 2-12 grossly intact, moves all extremities in coordinated fashion, sensation intact  Labs on Admission: I have personally reviewed following labs and imaging studies  CBC:  Recent Labs Lab 07/03/16 2138  WBC 7.0  HGB 12.4  HCT 38.5  MCV 81.7  PLT [id number]*   Basic Metabolic Panel:  Recent Labs Lab 07/03/16 2138  NA 134*  K 3.4*  CL 100*  CO2 27  GLUCOSE 232*  BUN 29*  CREATININE 1.77*  CALCIUM 8.6*   GFR: Estimated Creatinine Clearance: 47.3 mL/min (by C-G formula based on Cr of 1.77). Liver Function Tests:  Recent Labs Lab 07/03/16 2138  AST 24  ALT 26  ALKPHOS 55  BILITOT 0.9  PROT 7.5  ALBUMIN 3.6   No results for input(s):  LIPASE, AMYLASE in the last 168 hours. No results for input(s): AMMONIA in the last 168 hours. Coagulation Profile: No results for input(s): INR, PROTIME in the last 168 hours. Cardiac Enzymes: No results for input(s): CKTOTAL, CKMB, CKMBINDEX, TROPONINI in the last 168 hours. BNP (last 3 results) No results for input(s): PROBNP in the last 8760 hours. HbA1C: No results for input(s): HGBA1C in the last 72 hours. CBG:  Recent Labs Lab 07/03/16 2231  GLUCAP 225*   Lipid Profile: No results for input(s): CHOL, HDL, LDLCALC, TRIG, CHOLHDL, LDLDIRECT in the last 72  hours. Thyroid Function Tests: No results for input(s): TSH, T4TOTAL, FREET4, T3FREE, THYROIDAB in the last 72 hours. Anemia Panel: No results for input(s): VITAMINB12, FOLATE, FERRITIN, TIBC, IRON, RETICCTPCT in the last 72 hours. Urine analysis:    Component Value Date/Time   COLORURINE YELLOW 07/03/2016 2302   APPEARANCEUR CLEAR 07/03/2016 2302   LABSPEC 1.020 07/03/2016 2302   PHURINE 5.5 07/03/2016 2302   GLUCOSEU 100* 07/03/2016 2302   GLUCOSEU NEGATIVE 03/03/2015 1050   HGBUR NEGATIVE 07/03/2016 2302   BILIRUBINUR NEGATIVE 07/03/2016 2302   KETONESUR NEGATIVE 07/03/2016 2302   PROTEINUR TRACE* 07/03/2016 2302   UROBILINOGEN 0.2 03/03/2015 1050   NITRITE NEGATIVE 07/03/2016 2302   LEUKOCYTESUR TRACE* 07/03/2016 2302    Creatinine Clearance: Estimated Creatinine Clearance: 47.3 mL/min (by C-G formula based on Cr of 1.77).  Sepsis Labs: @LABRCNTIP (procalcitonin:4,lacticidven:4) )No results found for this or any previous visit (from the past 240 hour(s)).   Radiological Exams on Admission: Dg Chest 2 View  07/04/2016  CLINICAL DATA:  Weakness EXAM: CHEST  2 VIEW COMPARISON:  January 26, 2015 FINDINGS: No pneumothorax. Cardiomegaly. The hila and mediastinum are normal. A nodular density projected over the lateral left lung base is poorly evaluated due to poor penetration. No other nodules or masses. No focal  infiltrates. No overt edema. IMPRESSION: Nodular density projected over the lateral left lung base may be artifactual due to technique. Recommend a short-term follow-up with better penetration to ensure resolution. No acute abnormalities. Electronically Signed   By: Dorise Bullion III M.D   On: 07/04/2016 01:26    EKG: Independently reviewed.  Aifb with rate 144; no evidence of acute ischemia  Assessment/Plan Principal Problem:   Atrial fibrillation with tachycardic ventricular rate (HCC) Active Problems:   Diabetes type 2, controlled (HCC)   Benign essential HTN   Hypothyroid- TSH WNL   CKD (chronic kidney disease), stage III    Afib with RVR -Will admit to SDU for Diltiazem drip as per protocol with plan to transition to PO Diltiazem once heart rate is controlled.   -No chest pain and initial troponin negative so will not cycle cardiac enzymes -Will order TSH and UDS.   -Will repeat EKG in AM and plan to consult cardiology at that time (consult requested already). -Continue anticoagulation with Xarelto for CHA2DS2-VASc score 3, 3.2% stroke rate/year.  HTN -Continue Coreg, hold PO Cardizem  DM -Appears to be diet controlled -Will check A1c  Hypothyroidism -Recheck TSH as she has dosing adjustment about 4 months ago with no check since  CKD -Baseline creatinine appears to be 1.5-1.6 -Slightly above baseline for now -Will not give IVF and will follow    DVT prophylaxis: Xarelto Code Status:  Full - confirmed with patient/family Family Communication: Daughter-in-law present throughout evaluation; if needed, NOK is son, Stephens November, [phone] Disposition Plan:  Home once clinically improved Consults called: Cardiology  Admission status: Admit to SDU    Karmen Bongo MD [facility]  If 7PM-7AM, please contact night-coverage [email or web address] Password [email or web address]  07/04/2016, 2:19 AM

## 2016-07-04 NOTE — Consult Note (Signed)
Requesting provider: Dr. Kathie Dike Primary cardiologist: Dr. Kate Sable Consulting cardiologist: Dr. Satira Sark  Reason for consultation: Atrial fibrillation with RVR  Clinical Summary Ms. Clingman is a 60 y.o.female with past medical history outlined below, currently admitted to the hospital with feeling of dizziness and finding of atrial fibrillation with RVR. Patient thought that her potassium might have been low which was the main reason that she has sought medical attention. She reports compliance with her heart rate control regimen including Coreg and Cardizem CD which was recently increased at office visit per Dr. [name] on June 27. She has been treated with intravenous diltiazem and heart rate was in the 90-100 range on my examination.  She has a history of persistent atrial fibrillation status post prior failed cardioversion attempts, and EP consultation with Dr. [name] recommending rate control strategy.  She is not reporting any exertional chest pain and has NYHA class II dyspnea at baseline. She states that she has been trying to swim for exercise and lose some weight.   No Known Allergies  Medications Scheduled Medications: . carvedilol  25 mg Oral BID WC  . levothyroxine  100 mcg Oral QAC breakfast  . rivaroxaban  20 mg Oral Q supper  . sodium chloride flush  3 mL Intravenous Q12H    Infusions: . diltiazem (CARDIZEM) infusion 10 mg/hr (07/04/16 0600)    PRN Medications: sodium chloride, acetaminophen, ondansetron (ZOFRAN) IV, sodium chloride flush, zolpidem   Past Medical History  Diagnosis Date  . Essential hypertension   . Hypothyroidism   . Atrial fibrillation (HCC)      CHADSVASC score 4  . Obesity   . Atrial flutter ([place])   . Type 2 diabetes mellitus [facility])     Past Surgical History  Procedure Laterality Date  . Cholecystectomy  1979  . Tubal ligation  1980  . Tee without cardioversion N/A 01/25/2015    Procedure:  TRANSESOPHAGEAL ECHOCARDIOGRAM (TEE);  Surgeon: Satira Sark, MD;  Location: [facility];  Service: Cardiovascular;  Laterality: N/A;  . Cardioversion N/A 01/25/2015    Procedure: CARDIOVERSION;  Surgeon: Satira Sark, MD;  Location: [facility];  Service: Cardiovascular;  Laterality: N/A;  . Left heart catheterization with coronary angiogram N/A 01/27/2015    Procedure: LEFT HEART CATHETERIZATION WITH CORONARY ANGIOGRAM;  Surgeon: Sinclair Grooms, MD;  Location: [facility] CATH LAB;  Service: Cardiovascular;  Laterality: N/A;    Family History  Problem Relation Age of Onset  . Atrial fibrillation Neg Hx   . Diabetes Neg Hx   . Hypertension    . Coronary artery disease Sister   . Cancer Mother   . Heart disease Father   . Cancer Maternal Grandmother     Social History Ms. Elwell reports that she has never smoked. She has never used smokeless tobacco. Ms. Uhde reports that she does not drink alcohol.  Review of Systems Complete review of systems negative except as otherwise outlined in the clinical summary and also the following. Vague sense of palpitations.  Physical Examination Blood pressure 94/68, pulse 69, temperature 97 F (36.1 C), temperature source Oral, resp. rate 22, height 5\' 6"  (1.676 m), weight 300 lb 7.8 oz (136.3 kg), SpO2 98 %.  Intake/Output Summary (Last 24 hours) at 07/04/16 0926 Last data filed at 07/04/16 0919  Gross per 24 hour  Intake      0 ml  Output    900 ml  Net   -900 ml   Telemetry:  Atrial fibrillation.  Gen.: Morbidly obese woman in no distress. HEENT: Conjunctiva and lids normal, oropharynx clear. Neck: Supple, no elevated JVP or carotid bruits, no thyromegaly. Lungs: Clear to auscultation, nonlabored breathing at rest. Cardiac: Irregularly irregular, no S3 or significant systolic murmur, no pericardial rub. Abdomen: Soft, nontender, bowel sounds present, no guarding or rebound. Extremities: No pitting edema, distal pulses 2+. Skin: Warm and  dry. Musculoskeletal: No kyphosis. Neuropsychiatric: Alert and oriented x3, affect grossly appropriate.  Lab Results  Basic Metabolic Panel:  Recent Labs Lab 07/03/16 2138 07/04/16 0442  NA 134* 140  K 3.4* 4.0  CL 100* 104  CO2 27 27  GLUCOSE 232* 176*  BUN 29* 26*  CREATININE 1.77* 1.60*  CALCIUM 8.6* 8.5*    Liver Function Tests:  Recent Labs Lab 07/03/16 2138  AST 24  ALT 26  ALKPHOS 55  BILITOT 0.9  PROT 7.5  ALBUMIN 3.6    CBC:  Recent Labs Lab 07/03/16 2138  WBC 7.0  HGB 12.4  HCT 38.5  MCV 81.7  PLT 143*    Cardiac Enzymes: 0.00  ECG I partially reviewed the tracing from 07/04/2016 which showed coarse atrial fibrillation the 90s with diffuse nonspecific ST-T wave abnormalities.  Imaging  TEE 01/25/2015: Study Conclusions  - Left ventricle: Systolic function was mildly to moderately reduced. The estimated ejection fraction was in the range of 40% to 45%. Diffuse hypokinesis. - Descending aorta: The descending aorta had mild diffuse disease. - Mitral valve: There was trivial regurgitation. - Left atrium: The atrium was dilated. No evidence of thrombus in the atrial cavity or appendage. There was mildintermittent spontaneous echo contrast (&quot;smoke&quot;) in the appendage. Appendage velocity reduced at approximately 20 cm/s. - Right atrium: No evidence of thrombus in the atrial cavity or appendage. - Atrial septum: No defect or patent foramen ovale was identified. - Tricuspid valve: There was physiologic regurgitation. - Pericardium, extracardiac: There was no pericardial effusion.  Impressions:  - Rapid atrial flutter present during the study. LVEF estimated at 40-45% with diffuse hypokinesis in this setting. Consider repeat transthoracic echocardiogram later to reassess LVEF in sinus rhythm. No left or right atrial appendage thrombus with intermittent spontaneous contrast noted within the left atrium and  appendage. No PFO or ASD by color Doppler. Mild scattered atherosclerosis noted within the descending aorta.  Impression  1. Persistent atrial fibrillation with recent RVR. Patient is being followed closely by Dr. [name] as an outpatient and underwent recent medication adjustments on June 27. Patient states that she has been compliant with her medications at home. I do see that noncompliance has been a concern based on review of the chart. CHADSVASC score is 4 and she is on Xarelto.  2. Essential hypertension, blood pressure is adequately controlled.  3. Hypothyroidism, TSH 13.4. Question compliance with levothyroxine versus need for further medication titration.  4. Morbid obesity.   Recommendations  Discussed with Dr. Roderic Palau. Recommend converting to Cardizem 90 mg by mouth every 6 hours and stop intravenous diltiazem. Continue current dose of Coreg. Depending on heart rate control, can further titrate Cardizem dose. Otherwise continue Xarelto.  Satira Sark, M.D., F.A.C.C.

## 2016-07-05 DIAGNOSIS — N183 Chronic kidney disease, stage 3 (moderate): Secondary | ICD-10-CM

## 2016-07-05 DIAGNOSIS — I10 Essential (primary) hypertension: Secondary | ICD-10-CM | POA: Diagnosis not present

## 2016-07-05 DIAGNOSIS — I4891 Unspecified atrial fibrillation: Secondary | ICD-10-CM

## 2016-07-05 DIAGNOSIS — E032 Hypothyroidism due to medicaments and other exogenous substances: Secondary | ICD-10-CM

## 2016-07-05 LAB — HEMOGLOBIN A1C
HEMOGLOBIN A1C: 7.4 % — AB (ref 4.8–5.6)
Hgb A1c MFr Bld: 6.1 % — ABNORMAL HIGH (ref 4.8–5.6)
MEAN PLASMA GLUCOSE: 166 mg/dL
Mean Plasma Glucose: 128 mg/dL

## 2016-07-05 LAB — BASIC METABOLIC PANEL
Anion gap: 8 (ref 5–15)
BUN: 30 mg/dL — AB (ref 6–20)
CHLORIDE: 104 mmol/L (ref 101–111)
CO2: 27 mmol/L (ref 22–32)
Calcium: 9 mg/dL (ref 8.9–10.3)
Creatinine, Ser: 1.56 mg/dL — ABNORMAL HIGH (ref 0.44–1.00)
GFR calc Af Amer: 41 mL/min — ABNORMAL LOW (ref >60)
GFR calc non Af Amer: 36 mL/min — ABNORMAL LOW (ref >60)
GLUCOSE: 182 mg/dL — AB (ref 65–99)
POTASSIUM: 3.6 mmol/L (ref 3.5–5.1)
Sodium: 139 mmol/L (ref 135–145)

## 2016-07-05 LAB — GLUCOSE, CAPILLARY: Glucose-Capillary: 173 mg/dL — ABNORMAL HIGH (ref 65–99)

## 2016-07-05 MED ORDER — DILTIAZEM HCL ER COATED BEADS 180 MG PO CP24: 180.0000 mg | ORAL_CAPSULE | Freq: Every day | ORAL | Status: DC

## 2016-07-05 MED ORDER — DILTIAZEM HCL ER COATED BEADS 180 MG PO CP24: 180.0000 mg | ORAL_CAPSULE | Freq: Every evening | ORAL | Status: DC

## 2016-07-05 MED ORDER — DILTIAZEM HCL ER COATED BEADS 240 MG PO CP24
240.0000 mg | ORAL_CAPSULE | Freq: Every morning | ORAL | Status: DC
  Administered 2016-07-05: 240 mg via ORAL
  Filled 2016-07-05: qty 1

## 2016-07-05 NOTE — Progress Notes (Signed)
Pt given discharge instruction after pt understood instructions. Pt transported via wheelchair to family vehicle.

## 2016-07-05 NOTE — Discharge Summary (Signed)
Physician Discharge Summary  Isabella Hall [id number] DOB: Oct 11, 1956 DOA: 07/03/2016  PCP: Doree Albee, MD  Admit date: 07/03/2016 Discharge date: 07/05/2016  Admitted From: Home Disposition:  Home  Recommendations for Outpatient Follow-up:  1. Follow up with PCP in 1-2 weeks 2. Follow up with Cardiology as already scheduled   Discharge Condition:Stable CODE STATUS:full Diet recommendation: Heart healthy, diabetic  Brief/Interim Summary: 60 y.o. female with medical history significant of DM, HTN, and refractory afib with RVR, Patient reports feeling swimmy-headed, thinking her potassium was low, and sudden onset afib. Was driving home from work about 1830 and had to pull over and call 911. Has been in afib for several months and they are giving her medication to try to get it stop. She did get cardioverted during last hospitalization; lasted about 10 days and then went back into afib about 3 months ago. Has been on Xarelto, Lasix, Cardizem, and Coreg since. She claims absolute compliance with medication.  Afib with RVR -Patient was admitted to SDU for Diltiazem drip as per protocol  -Patient was later transitioned to oral Cardizem.  -TSH was mildly elevated. Dr. Roderic Palau I discussed case with patient's PCP who recommends continuing patient's current levothyroxine dose, and will follow-up as an outpatient.. -Continue anticoagulation with Xarelto for CHA2DS2-VASc score 3, 3.2% stroke rate/year. -Patient heart rate remained stable. Discussed case with cardiology who recommends continuing patient's home regimen with the exception of increasing her evening Cardizem dose from 120 mg to 180 mg (see med list below).  HTN -Patient continue on home regimen with the exception of increased evening Cardizem per above  DM -Appears to be diet controlled -Will check A1c  Hypothyroidism -Per above, TSH was mildly elevated. Patient to continue current levothyroxine dose and to  follow-up closely with her PCP.  CKD -Baseline creatinine appears to be 1.5-1.6  Discharge Diagnoses:  Principal Problem:   Atrial fibrillation with tachycardic ventricular rate (HCC) Active Problems:   Diabetes type 2, controlled (HCC)   Benign essential HTN   Hypothyroid- TSH WNL   CKD (chronic kidney disease), stage III      Medication List    TAKE these medications        carvedilol 25 MG tablet  Commonly known as:  COREG  Take 1 tablet (25 mg total) by mouth 2 (two) times daily with a meal.     diltiazem 240 MG 24 hr capsule  Commonly known as:  CARDIZEM CD  Take 1 capsule (240 mg total) by mouth daily.     diltiazem 180 MG 24 hr capsule  Commonly known as:  CARDIZEM CD  Take 1 capsule (180 mg total) by mouth daily.     furosemide 20 MG tablet  Commonly known as:  LASIX  Take 1 tablet (20 mg total) by mouth daily.     GARLIC PO  Take 1 tablet by mouth daily.     levothyroxine 100 MCG tablet  Commonly known as:  SYNTHROID, LEVOTHROID  Take 100 mcg by mouth daily.     multivitamin with minerals Tabs tablet  Take 1 tablet by mouth daily.     potassium chloride 10 MEQ tablet  Commonly known as:  K-DUR  Take 1 tablet (10 mEq total) by mouth daily.     sodium chloride 0.65 % nasal spray  Commonly known as:  OCEAN  Place 1 spray into both nostrils 3 (three) times daily as needed. Uses daily, up to three times per day during allergy season  XARELTO 20 MG Tabs tablet  Generic drug:  rivaroxaban  TAKE ONE TABLET BY MOUTH ONCE DAILY WITH SUPPER       Follow-up Information    Follow up with Doree Albee, MD In 2 weeks.   Specialty:  Internal Medicine   Why:  Hospital follow up   Contact information:   [address] [address] [phone]       Follow up with Kate Sable, MD In 1 week.   Specialty:  Cardiology   Why:  follow up as already scheduled   Contact information:   [place] [address] [phone]       No Known Allergies  Consultations:  Cardiology   Procedures/Studies: Dg Chest 2 View  07/04/2016  CLINICAL DATA:  Follow-up for possible pulmonary nodule. EXAM: CHEST  2 VIEW COMPARISON:  Chest radiograph 07/04/2016. FINDINGS: Monitoring leads overlie the patient. Stable enlarged cardiac and mediastinal contours. No consolidative pulmonary opacities. No pleural effusion or pneumothorax. Lateral view limited due to overlapping soft tissue. IMPRESSION: No acute cardiopulmonary process. Electronically Signed   By: Lovey Newcomer M.D.   On: 07/04/2016 13:36   Dg Chest 2 View  07/04/2016  CLINICAL DATA:  Weakness EXAM: CHEST  2 VIEW COMPARISON:  January 26, 2015 FINDINGS: No pneumothorax. Cardiomegaly. The hila and mediastinum are normal. A nodular density projected over the lateral left lung base is poorly evaluated due to poor penetration. No other nodules or masses. No focal infiltrates. No overt edema. IMPRESSION: Nodular density projected over the lateral left lung base may be artifactual due to technique. Recommend a short-term follow-up with better penetration to ensure resolution. No acute abnormalities. Electronically Signed   By: Dorise Bullion III M.D   On: 07/04/2016 01:26     Subjective: Feels well today. Eager to go home   Discharge Exam: Filed Vitals:   07/05/16 0920 07/05/16 1000  BP: 99/83 109/72  Pulse:  56  Temp:    Resp:  24   Filed Vitals:   07/05/16 0800 07/05/16 0900 07/05/16 0920 07/05/16 1000  BP:  81/46 99/83 109/72  Pulse: 137 65  56  Temp:      TempSrc:      Resp: 19 16  24   Height:      Weight:      SpO2: 99% 99%  99%    General: Pt is alert, awake, not in acute distress Cardiovascular: RRR, S1/S2 +, no rubs, no gallops Respiratory: CTA bilaterally, no wheezing, no rhonchi Abdominal: Soft, NT, ND, bowel sounds + Extremities: no edema, no cyanosis    The results of significant diagnostics from this hospitalization (including imaging,  microbiology, ancillary and laboratory) are listed below for reference.     Microbiology: Recent Results (from the past 240 hour(s))  MRSA PCR Screening     Status: None   Collection Time: 07/04/16  7:48 AM  Result Value Ref Range Status   MRSA by PCR NEGATIVE NEGATIVE Final    Comment:        The GeneXpert MRSA Assay (FDA approved for NASAL specimens only), is one component of a comprehensive MRSA colonization surveillance program. It is not intended to diagnose MRSA infection nor to guide or monitor treatment for MRSA infections.      Labs: BNP (last 3 results) No results for input(s): BNP in the last 8760 hours. Basic Metabolic Panel:  Recent Labs Lab 07/03/16 2138 07/04/16 0442 07/05/16 0505  NA 134* 140 139  K  3.4* 4.0 3.6  CL 100* 104 104  CO2 27 27 27   GLUCOSE 232* 176* 182*  BUN 29* 26* 30*  CREATININE 1.77* 1.60* 1.56*  CALCIUM 8.6* 8.5* 9.0   Liver Function Tests:  Recent Labs Lab 07/03/16 2138  AST 24  ALT 26  ALKPHOS 55  BILITOT 0.9  PROT 7.5  ALBUMIN 3.6   No results for input(s): LIPASE, AMYLASE in the last 168 hours. No results for input(s): AMMONIA in the last 168 hours. CBC:  Recent Labs Lab 07/03/16 2138  WBC 7.0  HGB 12.4  HCT 38.5  MCV 81.7  PLT 143*   Cardiac Enzymes: No results for input(s): CKTOTAL, CKMB, CKMBINDEX, TROPONINI in the last 168 hours. BNP: Invalid input(s): POCBNP CBG:  Recent Labs Lab 07/03/16 2231 07/04/16 1130 07/04/16 1605 07/04/16 2118 07/05/16 0725  GLUCAP 225* 238* 195* 165* 173*   D-Dimer No results for input(s): DDIMER in the last 72 hours. Hgb A1c  Recent Labs  07/03/16 2132 07/04/16 0558  HGBA1C 7.4* 6.1*   Lipid Profile No results for input(s): CHOL, HDL, LDLCALC, TRIG, CHOLHDL, LDLDIRECT in the last 72 hours. Thyroid function studies  Recent Labs  07/03/16 2132  TSH 13.416*   Anemia work up No results for input(s): VITAMINB12, FOLATE, FERRITIN, TIBC, IRON, RETICCTPCT  in the last 72 hours. Urinalysis    Component Value Date/Time   COLORURINE YELLOW 07/03/2016 2302   APPEARANCEUR CLEAR 07/03/2016 2302   LABSPEC 1.020 07/03/2016 2302   PHURINE 5.5 07/03/2016 2302   GLUCOSEU 100* 07/03/2016 2302   GLUCOSEU NEGATIVE 03/03/2015 1050   HGBUR NEGATIVE 07/03/2016 2302   BILIRUBINUR NEGATIVE 07/03/2016 2302   KETONESUR NEGATIVE 07/03/2016 2302   PROTEINUR TRACE* 07/03/2016 2302   UROBILINOGEN 0.2 03/03/2015 1050   NITRITE NEGATIVE 07/03/2016 2302   LEUKOCYTESUR TRACE* 07/03/2016 2302   Sepsis Labs Invalid input(s): PROCALCITONIN,  WBC,  LACTICIDVEN Microbiology Recent Results (from the past 240 hour(s))  MRSA PCR Screening     Status: None   Collection Time: 07/04/16  7:48 AM  Result Value Ref Range Status   MRSA by PCR NEGATIVE NEGATIVE Final    Comment:        The GeneXpert MRSA Assay (FDA approved for NASAL specimens only), is one component of a comprehensive MRSA colonization surveillance program. It is not intended to diagnose MRSA infection nor to guide or monitor treatment for MRSA infections.      SIGNED:   Donne Hazel, MD  [facility] 07/05/2016, 5:54 PM  If 7PM-7AM, please contact night-coverage [email or web address] Password [email or web address]

## 2016-07-05 NOTE — Progress Notes (Addendum)
Primary cardiologist: Dr. Kate Sable  Subjective:  Hoping to go home. Feels better  Objective:  Vital Signs in the last 24 hours: Temp:  [96.8 F (36 C)-98.3 F (36.8 C)] 97 F (36.1 C) (07/19 0727) Pulse Rate:  [33-120] 93 (07/19 0734) Resp:  [14-24] 14 (07/19 0700) BP: (94-137)/(68-113) 112/92 mmHg (07/19 0734) SpO2:  [95 %-100 %] 95 % (07/19 0700) Weight:  [302 lb 4 oz (137.1 kg)] 302 lb 4 oz (137.1 kg) (07/19 0509)  Intake/Output from previous day: 07/18 0701 - 07/19 0700 In: 840 [P.O.:840] Out: 400 [Urine:400] Intake/Output from this shift: Total I/O In: -  Out: 450 [Urine:450]  Physical Exam: NECK: Without JVD, HJR, or bruit LUNGS: Clear anterior, posterior, lateral HEART: Irregular rate and rhythm, no murmur, gallop, rub, bruit, thrill, or heave EXTREMITIES: Without cyanosis, clubbing, or edema  Telemetry: Afib with rates of 90-125 bpm  Lab Results:  Recent Labs  07/03/16 2138  WBC 7.0  HGB 12.4  PLT 143*    Recent Labs  07/04/16 0442 07/05/16 0505  NA 140 139  K 4.0 3.6  CL 104 104  CO2 27 27  GLUCOSE 176* 182*  BUN 26* 30*  CREATININE 1.60* 1.56*   Hepatic Function Panel  Recent Labs  07/03/16 2138  PROT 7.5  ALBUMIN 3.6  AST 24  ALT 26  ALKPHOS 55  BILITOT 0.9   Cardiac Studies:  TEE 01/25/2015: Study Conclusions  - Left ventricle: Systolic function was mildly to moderately   reduced. The estimated ejection fraction was in the range of 40%   to 45%. Diffuse hypokinesis. - Descending aorta: The descending aorta had mild diffuse disease. - Mitral valve: There was trivial regurgitation. - Left atrium: The atrium was dilated. No evidence of thrombus in   the atrial cavity or appendage. There was mildintermittent   spontaneous echo contrast (&quot;smoke&quot;) in the appendage. Appendage   velocity reduced at approximately 20 cm/s. - Right atrium: No evidence of thrombus in the atrial cavity or   appendage. - Atrial  septum: No defect or patent foramen ovale was identified. - Tricuspid valve: There was physiologic regurgitation. - Pericardium, extracardiac: There was no pericardial effusion.  Impressions:  - Rapid atrial flutter present during the study. LVEF estimated at   40-45% with diffuse hypokinesis in this setting. Consider repeat   transthoracic echocardiogram later to reassess LVEF in sinus   rhythm. No left or right atrial appendage thrombus with   intermittent spontaneous contrast noted within the left atrium   and appendage. No PFO or ASD by color Doppler. Mild scattered   atherosclerosis noted within the descending aorta.  Assessment/Plan:   1. Persistent atrial fibrillation with recent RVR. Patient is being followed closely by Dr. [name] as an outpatient and underwent recent medication adjustments on June 27. Patient states that she has been compliant with her medications at home. I do see that noncompliance has been a concern based on review of the chart. CHADSVASC score is 4 and she is on Xarelto. On Cardizem 90 mg q 6, Coreg 25 mg BID.  Was on Cardizem 240 mg am and 120mg  pm. Heart rate control better on essentially the same dose with short acting formulation.  2. Essential hypertension, blood pressure is adequately controlled.  3. Hypothyroidism, TSH 13.4. Question compliance with levothyroxine versus need for further medication titration.  4. Morbid obesity.   LOS: 1 day   Ermalinda Barrios 07/05/2016, 8:34 AM  Attending note:  Patient seen and  examined. Agree with above assessment by Ms. Bonnell Public PA-C. Heart rate better controlled on Cardizem 90 mg Q6 hours and Coreg 25 mg BID. I discussed with Dr. Wyline Copas. Anticipate discharge today and keep followup with Dr. [name] that is already scheduled. Would give Cardizem CD 240 mg dose this AM and plan to increase the evening dose to 180 mg along with Coreg 25 mg BID. If heart rate up at next visit in the setting of medication compliance,  can then increase Cardizem CD to 240 mg BID. Maintain Xarelto for stroke prophylaxis.  Satira Sark, M.D., F.A.C.C.

## 2016-07-05 NOTE — Discharge Instructions (Signed)
Please replace your 120mg  evening cardizem with 180mg  dose

## 2016-07-17 ENCOUNTER — Ambulatory Visit (INDEPENDENT_AMBULATORY_CARE_PROVIDER_SITE_OTHER): Payer: BLUE CROSS/BLUE SHIELD | Admitting: Cardiovascular Disease

## 2016-07-17 ENCOUNTER — Encounter: Payer: Self-pay | Admitting: Cardiovascular Disease

## 2016-07-17 VITALS — BP 130/90 | HR 73 | Ht 67.0 in | Wt 298.0 lb

## 2016-07-17 DIAGNOSIS — I10 Essential (primary) hypertension: Secondary | ICD-10-CM

## 2016-07-17 DIAGNOSIS — Z87898 Personal history of other specified conditions: Secondary | ICD-10-CM

## 2016-07-17 DIAGNOSIS — I4891 Unspecified atrial fibrillation: Secondary | ICD-10-CM | POA: Diagnosis not present

## 2016-07-17 DIAGNOSIS — I5032 Chronic diastolic (congestive) heart failure: Secondary | ICD-10-CM

## 2016-07-17 DIAGNOSIS — R0601 Orthopnea: Secondary | ICD-10-CM | POA: Diagnosis not present

## 2016-07-17 DIAGNOSIS — Z9289 Personal history of other medical treatment: Secondary | ICD-10-CM

## 2016-07-17 MED ORDER — DILTIAZEM HCL ER COATED BEADS 240 MG PO CP24: 240.0000 mg | ORAL_CAPSULE | Freq: Two times a day (BID) | ORAL | 3 refills | Status: DC

## 2016-07-17 NOTE — Patient Instructions (Addendum)
Medication Instructions:   Your physician has recommended you make the following change in your medication:   Increase diltiazem to 240 mg twice daily.  Continue all other medications the same.  Labwork: NONE  Testing/Procedures: NONE  Follow-Up:  Your physician recommends that you schedule a follow-up appointment in: 3 months with an EKG.  Any Other Special Instructions Will Be Listed Below (If Applicable).  If you need a refill on your cardiac medications before your next appointment, please call your pharmacy.

## 2016-07-17 NOTE — Progress Notes (Signed)
SUBJECTIVE: The patient presents for posthospitalization follow-up of persistent atrial fibrillation with rapid ventricular response. Cardizem and Coreg doses were adjusted.  She saw Dr. [name] 04/14/16. He mentioned she has been noncompliant with EP follow-up and has not made any real progress with lifestyle modification. He also stated given her obesity, prolonged atrial fibrillation, and compliance issues, the ability to achieve and maintain sinus rhythm long-term was very low. She did not wish to resume amiodarone and given compliance concerns, he did not advise Tikosyn. Given her obesity, she is not a candidate for ablation. He ultimately recommended rate control and bariatric surgery.  I saw her 6/27 and she was doing well but still had elevated HR's.  She denies chest pain, shortness of breath, and palpitations. Dr. Domenic Polite mentioned in his hospital note that should her heart rate be elevated at this office visit, to increase diltiazem to 240 mg twice daily.  ECG performed in the office today which I personally interpreted demonstrated atrial fibrillation, heart rate 89 bpm.  By auscultation her heart rate ranges between 95-100 bpm.   Review of Systems: As per "subjective", otherwise negative.  No Known Allergies  Current Outpatient Prescriptions  Medication Sig Dispense Refill  . carvedilol (COREG) 25 MG tablet Take 1 tablet (25 mg total) by mouth 2 (two) times daily with a meal. 180 tablet 3  . diltiazem (CARDIZEM CD) 180 MG 24 hr capsule Take 1 capsule (180 mg total) by mouth daily. 30 capsule 0  . diltiazem (CARDIZEM CD) 240 MG 24 hr capsule Take 1 capsule (240 mg total) by mouth daily. (Patient taking differently: Take 240 mg by mouth every morning. ) 90 capsule 3  . furosemide (LASIX) 20 MG tablet Take 1 tablet (20 mg total) by mouth daily. 30 tablet 6  . GARLIC PO Take 1 tablet by mouth daily.    Marland Kitchen levothyroxine (SYNTHROID, LEVOTHROID) 100 MCG tablet Take 100 mcg by  mouth daily.    . Multiple Vitamin (MULTIVITAMIN WITH MINERALS) TABS tablet Take 1 tablet by mouth daily.    . potassium chloride (K-DUR) 10 MEQ tablet Take 1 tablet (10 mEq total) by mouth daily. 30 tablet 6  . sodium chloride (OCEAN) 0.65 % nasal spray Place 1 spray into both nostrils 3 (three) times daily as needed. Uses daily, up to three times per day during allergy season    . XARELTO 20 MG TABS tablet TAKE ONE TABLET BY MOUTH ONCE DAILY WITH SUPPER 30 tablet 11   No current facility-administered medications for this visit.     Past Medical History:  Diagnosis Date  . Atrial fibrillation (HCC)     CHADSVASC score 4  . Atrial flutter ([place])   . Essential hypertension   . Hypothyroidism   . Obesity   . Type 2 diabetes mellitus ([place])     Past Surgical History:  Procedure Laterality Date  . CARDIOVERSION N/A 01/25/2015   Procedure: CARDIOVERSION;  Surgeon: Satira Sark, MD;  Location: [facility];  Service: Cardiovascular;  Laterality: N/A;  . CHOLECYSTECTOMY  1979  . LEFT HEART CATHETERIZATION WITH CORONARY ANGIOGRAM N/A 01/27/2015   Procedure: LEFT HEART CATHETERIZATION WITH CORONARY ANGIOGRAM;  Surgeon: Sinclair Grooms, MD;  Location: [facility] CATH LAB;  Service: Cardiovascular;  Laterality: N/A;  . TEE WITHOUT CARDIOVERSION N/A 01/25/2015   Procedure: TRANSESOPHAGEAL ECHOCARDIOGRAM (TEE);  Surgeon: Satira Sark, MD;  Location: [facility];  Service: Cardiovascular;  Laterality: N/A;  . [place]  Social History   Social History  . Marital status: Married    Spouse name: N/A  . Number of children: N/A  . Years of education: N/A   Occupational History  . 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 Smoking status: Never Smoker  . Smokeless tobacco: Never Used  . Alcohol use No  . Drug use: No  . Sexual activity: Yes    Birth control/ protection: Surgical   Other Topics Concern  . Not on file   Social History Narrative  .  No narrative on file     Vitals:   07/17/16 1633  BP: 130/90  Pulse: 73  Weight: 298 lb (135.2 kg)  Height: 5\' 7"  (1.702 m)    PHYSICAL EXAM General: NAD HEENT: Normal. Neck: No JVD, no thyromegaly. Lungs: Clear to auscultation bilaterally with normal respiratory effort. CV: Tachycardic, irregular rhythm, normal S1/S2, no S3, no murmur. No pretibial or periankle edema.  Abdomen: Obese.  Neurologic: Alert and oriented x 3.  Psych: Normal affect. Skin: Normal. Musculoskeletal: No gross deformities. Extremities: No clubbing or cyanosis.      ECG: Most recent ECG reviewed.      ASSESSMENT AND PLAN: 1. Rapid atrial fibrillation: Will increase diltiazem to 240 mg BID. Continue Coreg 25 mg BID. Continue Xarelto.   2. 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 Continue Coreg. No longer on ACEI. Continue Lasix. Aim to control HR.  3. Essential HTN: Controlled. No changes.  4. Morbid obesity: Continues to lose weight with lifestyle modifications.   Dispo: fu 3 months with ECG.   Kate Sable, M.D., F.A.C.C.

## 2016-08-10 DIAGNOSIS — E785 Hyperlipidemia, unspecified: Secondary | ICD-10-CM | POA: Diagnosis not present

## 2016-08-10 DIAGNOSIS — E119 Type 2 diabetes mellitus without complications: Secondary | ICD-10-CM | POA: Diagnosis not present

## 2016-08-10 DIAGNOSIS — E559 Vitamin D deficiency, unspecified: Secondary | ICD-10-CM | POA: Diagnosis not present

## 2016-08-10 DIAGNOSIS — E039 Hypothyroidism, unspecified: Secondary | ICD-10-CM | POA: Diagnosis not present

## 2016-08-10 DIAGNOSIS — N183 Chronic kidney disease, stage 3 (moderate): Secondary | ICD-10-CM | POA: Diagnosis not present

## 2016-08-24 ENCOUNTER — Emergency Department (HOSPITAL_COMMUNITY)
Admission: EM | Admit: 2016-08-24 | Discharge: 2016-08-24 | Disposition: A | Discharge status: Home / Self Care | Payer: BLUE CROSS/BLUE SHIELD | Attending: Emergency Medicine | Admitting: Emergency Medicine

## 2016-08-24 ENCOUNTER — Emergency Department (HOSPITAL_COMMUNITY): Payer: BLUE CROSS/BLUE SHIELD

## 2016-08-24 ENCOUNTER — Encounter (HOSPITAL_COMMUNITY): Payer: Self-pay

## 2016-08-24 DIAGNOSIS — Y92007 Garden or yard of unspecified non-institutional (private) residence as the place of occurrence of the external cause: Secondary | ICD-10-CM | POA: Diagnosis not present

## 2016-08-24 DIAGNOSIS — S99911A Unspecified injury of right ankle, initial encounter: Secondary | ICD-10-CM | POA: Diagnosis not present

## 2016-08-24 DIAGNOSIS — M25571 Pain in right ankle and joints of right foot: Secondary | ICD-10-CM | POA: Diagnosis not present

## 2016-08-24 DIAGNOSIS — S99921A Unspecified injury of right foot, initial encounter: Secondary | ICD-10-CM | POA: Diagnosis not present

## 2016-08-24 DIAGNOSIS — I5031 Acute diastolic (congestive) heart failure: Secondary | ICD-10-CM | POA: Insufficient documentation

## 2016-08-24 DIAGNOSIS — S93601A Unspecified sprain of right foot, initial encounter: Secondary | ICD-10-CM | POA: Diagnosis not present

## 2016-08-24 DIAGNOSIS — E039 Hypothyroidism, unspecified: Secondary | ICD-10-CM | POA: Insufficient documentation

## 2016-08-24 DIAGNOSIS — Z79899 Other long term (current) drug therapy: Secondary | ICD-10-CM | POA: Diagnosis not present

## 2016-08-24 DIAGNOSIS — E1122 Type 2 diabetes mellitus with diabetic chronic kidney disease: Secondary | ICD-10-CM | POA: Insufficient documentation

## 2016-08-24 DIAGNOSIS — Y999 Unspecified external cause status: Secondary | ICD-10-CM | POA: Diagnosis not present

## 2016-08-24 DIAGNOSIS — Y939 Activity, unspecified: Secondary | ICD-10-CM | POA: Diagnosis not present

## 2016-08-24 DIAGNOSIS — I130 Hypertensive heart and chronic kidney disease with heart failure and stage 1 through stage 4 chronic kidney disease, or unspecified chronic kidney disease: Secondary | ICD-10-CM | POA: Insufficient documentation

## 2016-08-24 DIAGNOSIS — M79671 Pain in right foot: Secondary | ICD-10-CM | POA: Diagnosis not present

## 2016-08-24 DIAGNOSIS — N183 Chronic kidney disease, stage 3 (moderate): Secondary | ICD-10-CM | POA: Insufficient documentation

## 2016-08-24 DIAGNOSIS — X501XXA Overexertion from prolonged static or awkward postures, initial encounter: Secondary | ICD-10-CM | POA: Diagnosis not present

## 2016-08-24 DIAGNOSIS — M7989 Other specified soft tissue disorders: Secondary | ICD-10-CM | POA: Diagnosis not present

## 2016-08-24 MED ORDER — TRAMADOL HCL 50 MG PO TABS: 50.0000 mg | ORAL_TABLET | Freq: Four times a day (QID) | ORAL | 0 refills | Status: DC | PRN

## 2016-08-24 NOTE — ED Triage Notes (Signed)
Patient states she fell and twisted her ankle 4 days ago. C/o pain and swelling to right ankle and bruise to left forearm

## 2016-08-24 NOTE — Discharge Instructions (Signed)
Continue using ice and elevation as you are doing.  Wear the foot and ankle brace for support.  Use the tramadol prescribed if needed for pain relief.  This is a mild narcotic and may cause mild drowsiness, do not drive within 4 hours of taking this medication.  It is not safe for you to take ibuprofen while you are on Xaralto so please stop taking this medication.

## 2016-08-25 NOTE — ED Provider Notes (Signed)
[facility] DEPT Provider Note   CSN: [id number] Arrival date & time: 08/24/16  1858     History   Chief Complaint Chief Complaint  Patient presents with  . Fall    HPI Isabella Hall is a 60 y.o. female with past medical history outlined below presenting with persistent right foot and ankle pain and swelling since stepping in a hole in a friends yard and twisting the foot 4 days ago.  She has employed ice, elevation and has been using ibuprofen for inflammation and pain without relief.  She has been able to weight bear but also has the use of a walker which she has been using.  She is on xaralto for chronic a fib.  She denies head injury, dizziness, confusion, nausea or vomiting.  She sustained a bruise on her left forearm during the fall but denies pain at this site.  The history is provided by the patient.    Past Medical History:  Diagnosis Date  . Atrial fibrillation (HCC)     CHADSVASC score 4  . Atrial flutter ([place])   . Essential hypertension   . Hypothyroidism   . Obesity   . Type 2 diabetes mellitus [facility])     Patient Active Problem List   Diagnosis Date Noted  . Atrial fibrillation with tachycardic ventricular rate ([place]) 07/04/2016  . Nonischemic cardiomyopathy ([place])   . Atrial flutter ([place]) 01/25/2015  . Acute diastolic heart failure ([place])   . Demand ischemia ([place])   . Moderate mitral regurgitation   . Dyspnea 01/20/2015  . Diabetes type 2, controlled ([place]) 01/20/2015  . Acute CHF- secondary to AF with RVR 01/20/2015  . Benign essential HTN 01/20/2015  . Hypothyroid- TSH WNL 01/20/2015  . Obesity-BMI 45 01/20/2015  . Acute respiratory failure with hypoxia ([place]) 01/20/2015  . Elevated troponin 01/20/2015  . CKD (chronic kidney disease), stage III 01/20/2015    Past Surgical History:  Procedure Laterality Date  . CARDIOVERSION N/A 01/25/2015   Procedure: CARDIOVERSION;  Surgeon: Satira Sark, MD;  Location: [facility]S;  Service: Cardiovascular;   Laterality: N/A;  . CHOLECYSTECTOMY  1979  . LEFT HEART CATHETERIZATION WITH CORONARY ANGIOGRAM N/A 01/27/2015   Procedure: LEFT HEART CATHETERIZATION WITH CORONARY ANGIOGRAM;  Surgeon: Sinclair Grooms, MD;  Location: [facility] CATH LAB;  Service: Cardiovascular;  Laterality: N/A;  . TEE WITHOUT CARDIOVERSION N/A 01/25/2015   Procedure: TRANSESOPHAGEAL ECHOCARDIOGRAM (TEE);  Surgeon: Satira Sark, MD;  Location: [facility] ORS;  Service: Cardiovascular;  Laterality: N/A;  . TUBAL LIGATION  1980    OB History    No data available       Home Medications    Prior to Admission medications   Medication Sig Start Date End Date Taking? Authorizing Provider  carvedilol (COREG) 25 MG tablet Take 1 tablet (25 mg total) by mouth 2 (two) times daily with a meal. 04/14/16   Thompson Grayer, MD  diltiazem (CARDIZEM CD) 240 MG 24 hr capsule Take 1 capsule (240 mg total) by mouth 2 (two) times daily. 07/17/16 dose increase 07/17/16   Herminio Commons, MD  furosemide (LASIX) 20 MG tablet Take 1 tablet (20 mg total) by mouth daily. 04/06/16   Herminio Commons, MD  GARLIC PO Take 1 tablet by mouth daily.    Historical Provider, MD  levothyroxine (SYNTHROID, LEVOTHROID) 100 MCG tablet Take 100 mcg by mouth daily.    Historical Provider, MD  Multiple Vitamin (MULTIVITAMIN WITH MINERALS) TABS tablet Take 1 tablet by  mouth daily.    Historical Provider, MD  potassium chloride (K-DUR) 10 MEQ tablet Take 1 tablet (10 mEq total) by mouth daily. 04/06/16   Herminio Commons, MD  sodium chloride (OCEAN) 0.65 % nasal spray Place 1 spray into both nostrils 3 (three) times daily as needed. Uses daily, up to three times per day during allergy season    Historical Provider, MD  traMADol (ULTRAM) 50 MG tablet Take 1 tablet (50 mg total) by mouth every 6 (six) hours as needed. 08/24/16   Evalee Jefferson, PA-C  XARELTO 20 MG TABS tablet TAKE ONE TABLET BY MOUTH ONCE DAILY WITH SUPPER 12/07/15   Herminio Commons, MD    Family  History Family History  Problem Relation Age of Onset  . Cancer Mother   . Heart disease Father   . Hypertension    . Coronary artery disease Sister   . Cancer Maternal Grandmother   . Atrial fibrillation Neg Hx   . Diabetes Neg Hx     Social History Social History  Substance Use Topics  . Smoking status: Never Smoker  . Smokeless tobacco: Never Used  . Alcohol use No     Allergies   Review of patient's allergies indicates no known allergies.   Review of Systems Review of Systems  Musculoskeletal: Positive for arthralgias and joint swelling.  Skin: Positive for color change. Negative for wound.  Neurological: Negative for weakness and numbness.     Physical Exam Updated Vital Signs BP 140/99 (BP Location: Left Arm)   Pulse 92   Temp 98.1 F (36.7 C) (Oral)   Resp 18   Ht 5\' 5"  (1.651 m)   Wt 127 kg   SpO2 96%   BMI 46.59 kg/m   Physical Exam  Constitutional: She appears well-developed and well-nourished.  HENT:  Head: Normocephalic.  Cardiovascular: Normal rate and intact distal pulses.  Exam reveals no decreased pulses.   Pulses:      Dorsalis pedis pulses are 2+ on the right side, and 2+ on the left side.       Posterior tibial pulses are 2+ on the right side, and 2+ on the left side.  Musculoskeletal: She exhibits edema and tenderness.       Right knee: Normal.       Right ankle: She exhibits decreased range of motion and ecchymosis. She exhibits no swelling and normal pulse. Tenderness. Lateral malleolus tenderness found. No head of 5th metatarsal and no proximal fibula tenderness found. Achilles tendon normal.       Left forearm: She exhibits no bony tenderness.       Arms:      Right foot: There is decreased range of motion, bony tenderness and swelling. There is normal capillary refill, no crepitus and no deformity.       Feet:  Neurological: She is alert. No sensory deficit.  Skin: Skin is warm, dry and intact.  Nursing note and vitals  reviewed.    ED Treatments / Results  Labs (all labs ordered are listed, but only abnormal results are displayed) Labs Reviewed - No data to display  EKG  EKG Interpretation None       Radiology Dg Ankle Complete Right  Result Date: 08/24/2016 CLINICAL DATA:  Anterior RIGHT ankle pain, pain at top of RIGHT foot, stepped in a hole on Sunday causing her to fall, initial encounter, history diabetes mellitus, hypertension, atrial fibrillation EXAM: RIGHT ANKLE - COMPLETE 3+ VIEW COMPARISON:  None FINDINGS: Diffuse soft  tissue swelling greatest anteriorly. Ankle mortise intact. Osseous mineralization normal. No acute fracture, dislocation or bone destruction. Small plantar calcaneal spur. IMPRESSION: No acute osseous abnormalities. Soft tissue swelling. Small calcaneal spur. Electronically Signed   By: Lavonia Dana M.D.   On: 08/24/2016 19:55   Dg Foot Complete Right  Result Date: 08/24/2016 CLINICAL DATA:  Anterior RIGHT ankle pain, pain at top of RIGHT foot, stepped in a hole on Sunday causing her to fall, initial encounter, history diabetes mellitus, hypertension, atrial fibrillation EXAM: RIGHT FOOT COMPLETE - 3+ VIEW COMPARISON:  None FINDINGS: Soft tissue swelling at dorsum of foot into anterior ankle. Small plantar calcaneal spur. Osseous mineralization normal. Joint spaces preserved. No acute fracture, dislocation, or bone destruction. IMPRESSION: No acute osseous abnormalities. Soft tissue swelling and calcaneal spurring. Electronically Signed   By: Lavonia Dana M.D.   On: 08/24/2016 19:56    Procedures Procedures (including critical care time)  Medications Ordered in ED Medications - No data to display   Initial Impression / Assessment and Plan / ED Course  I have reviewed the triage vital signs and the nursing notes.  Pertinent labs & imaging results that were available during my care of the patient were reviewed by me and considered in my medical decision making (see chart for  details).  Clinical Course    [place], continue RICE.  Tramadol.  F/u with pcp for a recheck (already has appt scheduled in one week).  The patient appears reasonably screened and/or stabilized for discharge and I doubt any other medical condition or other [facility] requiring further screening, evaluation, or treatment in the ED at this time prior to discharge.   Final Clinical Impressions(s) / ED Diagnoses   Final diagnoses:  Foot sprain, right, initial encounter    New Prescriptions Discharge Medication List as of 08/24/2016  8:40 PM    START taking these medications   Details  traMADol (ULTRAM) 50 MG tablet Take 1 tablet (50 mg total) by mouth every 6 (six) hours as needed., Starting Thu 08/24/2016, Print         Evalee Jefferson, PA-C 08/25/16 1355    Nat Christen, MD 08/25/16 2245

## 2016-10-13 ENCOUNTER — Telehealth: Payer: Self-pay | Admitting: Cardiovascular Disease

## 2016-10-13 NOTE — Telephone Encounter (Signed)
Patient has a terrible cough since Monday.  . Wants to know what she take with her A.Fib.

## 2016-10-13 NOTE — Telephone Encounter (Signed)
Depends on her type of cough. If it is a dry cough, would recommend a product with dextromethorphan as the active ingredient to act as a cough suppressant (like Robitussin). If she has a productive cough, would recommend something with guaifenesin (like Mucinex). This is an expectorant that will help to clear out the mucus from her chest.

## 2016-10-16 NOTE — Telephone Encounter (Signed)
Patient notified and verbalized understanding. 

## 2016-10-31 ENCOUNTER — Ambulatory Visit: Payer: BLUE CROSS/BLUE SHIELD | Admitting: Cardiovascular Disease

## 2016-11-20 DIAGNOSIS — Z23 Encounter for immunization: Secondary | ICD-10-CM | POA: Diagnosis not present

## 2016-11-20 DIAGNOSIS — E039 Hypothyroidism, unspecified: Secondary | ICD-10-CM | POA: Diagnosis not present

## 2016-11-20 DIAGNOSIS — I480 Paroxysmal atrial fibrillation: Secondary | ICD-10-CM | POA: Diagnosis not present

## 2016-11-20 DIAGNOSIS — Z0000 Encounter for general adult medical examination without abnormal findings: Secondary | ICD-10-CM | POA: Diagnosis not present

## 2016-11-20 DIAGNOSIS — N183 Chronic kidney disease, stage 3 (moderate): Secondary | ICD-10-CM | POA: Diagnosis not present

## 2016-11-20 DIAGNOSIS — E119 Type 2 diabetes mellitus without complications: Secondary | ICD-10-CM | POA: Diagnosis not present

## 2016-11-20 DIAGNOSIS — E559 Vitamin D deficiency, unspecified: Secondary | ICD-10-CM | POA: Diagnosis not present

## 2016-11-20 DIAGNOSIS — E785 Hyperlipidemia, unspecified: Secondary | ICD-10-CM | POA: Diagnosis not present

## 2016-11-29 ENCOUNTER — Ambulatory Visit (INDEPENDENT_AMBULATORY_CARE_PROVIDER_SITE_OTHER): Payer: BLUE CROSS/BLUE SHIELD | Admitting: Cardiovascular Disease

## 2016-11-29 ENCOUNTER — Encounter: Payer: Self-pay | Admitting: Cardiovascular Disease

## 2016-11-29 VITALS — BP 136/100 | HR 136 | Ht 67.0 in | Wt 299.0 lb

## 2016-11-29 DIAGNOSIS — I4891 Unspecified atrial fibrillation: Secondary | ICD-10-CM

## 2016-11-29 DIAGNOSIS — I10 Essential (primary) hypertension: Secondary | ICD-10-CM

## 2016-11-29 DIAGNOSIS — I428 Other cardiomyopathies: Secondary | ICD-10-CM

## 2016-11-29 DIAGNOSIS — I483 Typical atrial flutter: Secondary | ICD-10-CM

## 2016-11-29 DIAGNOSIS — I5032 Chronic diastolic (congestive) heart failure: Secondary | ICD-10-CM | POA: Diagnosis not present

## 2016-11-29 MED ORDER — CARVEDILOL 25 MG PO TABS: 37.5000 mg | ORAL_TABLET | Freq: Two times a day (BID) | ORAL | 6 refills | Status: DC

## 2016-11-29 NOTE — Patient Instructions (Addendum)
Medication Instructions:   Increase Coreg to 37.5mg  twice a day.  Continue all other medications.    Labwork: none  Testing/Procedures: None  Follow-Up:  To be determined with Dr. [name].  Dr. [name] - 4-6 weeks   Any Other Special Instructions Will Be Listed Below (If Applicable). EKG nurse visit in 1 month.    If you need a refill on your cardiac medications before your next appointment, please call your pharmacy.

## 2016-11-29 NOTE — Progress Notes (Signed)
SUBJECTIVE: The patient presents for follow-up of persistent atrial fibrillation.  She saw Dr. [name] 04/14/16. He mentioned she has been noncompliant with EP follow-up and has not made any real progress with lifestyle modification. He also stated given her obesity, prolonged atrial fibrillation, and compliance issues, the ability to achieve and maintain sinus rhythm long-term was very low. She did not wish to resume amiodarone and given compliance concerns, he did not advise Tikosyn. Given her obesity, she is not a candidate for ablation. He ultimately recommended rate control and bariatric surgery.  ECG today shows rapid atrial fibrillation with diffuse ST segment and T-wave abnormalities, heart rate 131 bpm.  She is taking diltiazem and Coreg as prescribed. She has been struggling with an upper respiratory tract infection for 4 weeks.   Review of Systems: As per "subjective", otherwise negative.  No Known Allergies  Current Outpatient Prescriptions  Medication Sig Dispense Refill  . carvedilol (COREG) 25 MG tablet Take 1 tablet (25 mg total) by mouth 2 (two) times daily with a meal. 180 tablet 3  . diltiazem (CARDIZEM CD) 240 MG 24 hr capsule Take 1 capsule (240 mg total) by mouth 2 (two) times daily. 07/17/16 dose increase 180 capsule 3  . furosemide (LASIX) 20 MG tablet Take 1 tablet (20 mg total) by mouth daily. 30 tablet 6  . GARLIC PO Take 1 tablet by mouth daily.    Marland Kitchen levothyroxine (SYNTHROID, LEVOTHROID) 100 MCG tablet Take 100 mcg by mouth daily.    . Multiple Vitamin (MULTIVITAMIN WITH MINERALS) TABS tablet Take 1 tablet by mouth daily.    . potassium chloride (K-DUR) 10 MEQ tablet Take 1 tablet (10 mEq total) by mouth daily. 30 tablet 6  . sodium chloride (OCEAN) 0.65 % nasal spray Place 1 spray into both nostrils 3 (three) times daily as needed. Uses daily, up to three times per day during allergy season    . traMADol (ULTRAM) 50 MG tablet Take 1 tablet (50 mg total) by  mouth every 6 (six) hours as needed. 20 tablet 0  . XARELTO 20 MG TABS tablet TAKE ONE TABLET BY MOUTH ONCE DAILY WITH SUPPER 30 tablet 11   No current facility-administered medications for this visit.     Past Medical History:  Diagnosis Date  . Atrial fibrillation (HCC)     CHADSVASC score 4  . Atrial flutter ([place])   . Essential hypertension   . Hypothyroidism   . Obesity   . Type 2 diabetes mellitus ([place])     Past Surgical History:  Procedure Laterality Date  . CARDIOVERSION N/A 01/25/2015   Procedure: CARDIOVERSION;  Surgeon: Satira Sark, MD;  Location: [facility]S;  Service: Cardiovascular;  Laterality: N/A;  . CHOLECYSTECTOMY  1979  . LEFT HEART CATHETERIZATION WITH CORONARY ANGIOGRAM N/A 01/27/2015   Procedure: LEFT HEART CATHETERIZATION WITH CORONARY ANGIOGRAM;  Surgeon: Sinclair Grooms, MD;  Location: [facility] CATH LAB;  Service: Cardiovascular;  Laterality: N/A;  . TEE WITHOUT CARDIOVERSION N/A 01/25/2015   Procedure: TRANSESOPHAGEAL ECHOCARDIOGRAM (TEE);  Surgeon: Satira Sark, MD;  Location: [facility] ORS;  Service: Cardiovascular;  Laterality: N/A;  . [place] History   Social History  . Marital status: Married    Spouse name: N/A  . Number of children: N/A  . Years of education: N/A   Occupational History  . 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 Smoking  status: Never Smoker  . Smokeless tobacco: Never Used  . Alcohol use No  . Drug use: No  . Sexual activity: Yes    Birth control/ protection: Surgical   Other Topics Concern  . Not on file   Social History Narrative  . No narrative on file     Vitals:   11/29/16 0824  BP: (!) 136/100  Pulse: (!) 136  SpO2: 98%  Weight: 299 lb (135.6 kg)  Height: 5\' 7"  (1.702 m)    PHYSICAL EXAM General: NAD HEENT: Normal. Neck: No JVD, no thyromegaly. Lungs: Clear to auscultation bilaterally with normal respiratory effort. CV: Tachycardic, irregular  rhythm, normal S1/S2, no S3, no murmur. No pretibial or periankle edema.  Abdomen: Obese.  Neurologic: Alert and oriented x 3.  Psych: Normal affect. Skin: Normal. Musculoskeletal: No gross deformities. Extremities: No clubbing or cyanosis.     ECG: Most recent ECG reviewed.      ASSESSMENT AND PLAN: 1. Rapid atrial fibrillation: Will increase Coreg to 37.5 mg BID. Continue diltiazem and Xarelto at present doses.  As medical therapy does not appear to be controlling her heart rate, she may need AV node ablation and pacemaker implantation. I will defer to Dr. [name] and have her see him in the very near future. Will have her ECG repeated in one month.  2. 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 Continue Coreg. No longer on ACEI. Continue Lasix. Aim to control HR.  3. Essential HTN: Elevated. Increase Coreg as above.  4. Morbid obesity: Continues to lose weight with lifestyle modifications.   Dispo: Return in 1 month for ECG only. Will arrange for follow up with Dr. [name] as her primary problems are arrhythmic in nature.   Kate Sable, M.D., F.A.C.C.

## 2016-11-30 DIAGNOSIS — J209 Acute bronchitis, unspecified: Secondary | ICD-10-CM | POA: Diagnosis not present

## 2016-12-19 ENCOUNTER — Other Ambulatory Visit: Payer: Self-pay | Admitting: Cardiovascular Disease

## 2016-12-20 ENCOUNTER — Telehealth: Payer: Self-pay | Admitting: Cardiovascular Disease

## 2016-12-20 NOTE — Telephone Encounter (Signed)
Cant afford XARELTO 20 MG TABS tablet  Would like to know if she can get samples so she doesn't have to purchase

## 2016-12-20 NOTE — Telephone Encounter (Signed)
Patient informed that we currently are out of samples.  Patient stated that this year she does have a $300.00 deductible to meet on her prescriptions before being able to get at lesser co-pay dollar amount.  $0 co-pay savings card was given as she does have commercial insurance.  She will pick up today.

## 2016-12-22 ENCOUNTER — Ambulatory Visit (INDEPENDENT_AMBULATORY_CARE_PROVIDER_SITE_OTHER): Payer: BLUE CROSS/BLUE SHIELD | Admitting: Internal Medicine

## 2016-12-22 ENCOUNTER — Encounter: Payer: Self-pay | Admitting: Internal Medicine

## 2016-12-22 VITALS — BP 112/77 | HR 98 | Ht 67.0 in | Wt 297.0 lb

## 2016-12-22 DIAGNOSIS — E6609 Other obesity due to excess calories: Secondary | ICD-10-CM

## 2016-12-22 DIAGNOSIS — G4733 Obstructive sleep apnea (adult) (pediatric): Secondary | ICD-10-CM

## 2016-12-22 DIAGNOSIS — I5032 Chronic diastolic (congestive) heart failure: Secondary | ICD-10-CM

## 2016-12-22 DIAGNOSIS — I4891 Unspecified atrial fibrillation: Secondary | ICD-10-CM | POA: Diagnosis not present

## 2016-12-22 NOTE — Patient Instructions (Addendum)
Medication Instructions:  Continue all current medications.  Labwork: none  Testing/Procedures: none  Follow-Up: With Dr. [name] after seeing Dr. Hassell Done as needed.    Any Other Special Instructions Will Be Listed Below (If Applicable). Dr. [name] - next few months.   EKG visit has been cancelled.    If you need a refill on your cardiac medications before your next appointment, please call your pharmacy.

## 2016-12-22 NOTE — Progress Notes (Signed)
PCP: Doree Albee, MD Primary Cardiologist:  Dr Lisabeth Register Isabella Hall is a 61 y.o. female who presents today for electrophysiology followup.  She is well known to me.  Due to noncompliance with lifestyle modification and follow-up, we have opted for a rate control strategy.  She has been working at increasing exercise but has not made any real progress with weight reduction. She has frequent RVR but appears to be minimally symptomatic.  SOB and fatigue appear to be controlled currently.   Today, she denies symptoms of chest pain,  lower extremity edema, dizziness, presyncope, or syncope.  The patient is otherwise without complaint today.   Past Medical History:  Diagnosis Date  . Atrial fibrillation (HCC)     CHADSVASC score 4  . Atrial flutter ([place])   . Essential hypertension   . Hypothyroidism   . Obesity   . Type 2 diabetes mellitus ([place])    Past Surgical History:  Procedure Laterality Date  . CARDIOVERSION N/A 01/25/2015   Procedure: CARDIOVERSION;  Surgeon: Satira Sark, MD;  Location: [facility];  Service: Cardiovascular;  Laterality: N/A;  . CHOLECYSTECTOMY  1979  . LEFT HEART CATHETERIZATION WITH CORONARY ANGIOGRAM N/A 01/27/2015   Procedure: LEFT HEART CATHETERIZATION WITH CORONARY ANGIOGRAM;  Surgeon: Sinclair Grooms, MD;  Location: [facility] CATH LAB;  Service: Cardiovascular;  Laterality: N/A;  . TEE WITHOUT CARDIOVERSION N/A 01/25/2015   Procedure: TRANSESOPHAGEAL ECHOCARDIOGRAM (TEE);  Surgeon: Satira Sark, MD;  Location: [facility] ORS;  Service: Cardiovascular;  Laterality: N/A;  . [place] are reviewed and negatives except as per HPI above  Current Outpatient Prescriptions  Medication Sig Dispense Refill  . carvedilol (COREG) 25 MG tablet Take 1.5 tablets (37.5 mg total) by mouth 2 (two) times daily. 90 tablet 6  . CINNAMON PO Take 1 capsule by mouth daily.    Marland Kitchen diltiazem (CARDIZEM CD) 240 MG 24 hr capsule Take 1 capsule (240 mg  total) by mouth 2 (two) times daily. 07/17/16 dose increase 180 capsule 3  . furosemide (LASIX) 20 MG tablet Take 1 tablet (20 mg total) by mouth daily. 30 tablet 6  . GARLIC PO Take 1 tablet by mouth daily.    Marland Kitchen levothyroxine (SYNTHROID, LEVOTHROID) 112 MCG tablet Take 1 tablet by mouth daily.  1  . Multiple Vitamin (MULTIVITAMIN WITH MINERALS) TABS tablet Take 1 tablet by mouth daily.    . potassium chloride (K-DUR) 10 MEQ tablet Take 1 tablet (10 mEq total) by mouth daily. 30 tablet 6  . sodium chloride (OCEAN) 0.65 % nasal spray Place 1 spray into both nostrils 3 (three) times daily as needed. Uses daily, up to three times per day during allergy season    . traMADol (ULTRAM) 50 MG tablet Take 1 tablet (50 mg total) by mouth every 6 (six) hours as needed. 20 tablet 0  . vitamin C (ASCORBIC ACID) 500 MG tablet Take 500 mg by mouth 2 (two) times daily.    . vitamin E 400 UNIT capsule Take 400 Units by mouth daily.    Alveda Reasons 20 MG TABS tablet TAKE ONE TABLET BY MOUTH ONCE DAILY WITH SUPPER 90 tablet 0   No current facility-administered medications for this visit.     Physical Exam: Vitals:   12/22/16 0904  BP: 112/77  Pulse: 98  SpO2: 97%  Weight: 297 lb (134.7 kg)  Height: 5\' 7"  (1.702 m)    GEN- The patient is  morbidly obese, alert and oriented x 3 today.   Head- normocephalic, atraumatic Eyes-  Sclera clear, conjunctiva pink Ears- hearing intact Oropharynx- clear Lungs- Clear to ausculation bilaterally, normal work of breathing Heart- irregular rate and rhythm, no murmurs, rubs or gallops, PMI not laterally displaced GI- soft, NT, ND, + BS Extremities- no clubbing, cyanosis, or edema  ekg today reveals afib, V rate 130s  Assessment and Plan:  1. Persistent afib with RVR Given obesity, prolonged afib, and compliance issues, I worry that our ability to achieve and maintain sinus rhythm long term is very low.  V rates are chronically elevated. She is not a candidate for EP  procedures at this time.  I am not excited about AV nodal ablation.  Clearly the appropriate strategy for her is weight reduction.  I suspect that this may require bariatric surgery.  I have again today encouraged her to establish with the [facility]. No changes to her AF regimen are advised at this time. Continue long term anticoagulation  2. Nonischemic CM- resolved per prior echo Likely tachycardia mediated No CHF on exam or by symptoms  3. HTN Stable No change required today  4. Morbid obesity Body mass index is 46.52 kg/m. As above I have strongly advised that she be considered for bariatric surgery previously.    Follow-up with Dr [name] as scheduled I will see again after she has had her initial visit with the bariatric team.  I have very little to offer her until we are able to get her weight down.  Thompson Grayer MD, [facility] 12/22/2016 9:45 AM

## 2017-02-05 DIAGNOSIS — E039 Hypothyroidism, unspecified: Secondary | ICD-10-CM | POA: Diagnosis not present

## 2017-02-20 ENCOUNTER — Ambulatory Visit: Payer: BLUE CROSS/BLUE SHIELD | Admitting: Cardiovascular Disease

## 2017-02-22 ENCOUNTER — Encounter: Payer: Self-pay | Admitting: *Deleted

## 2017-02-23 ENCOUNTER — Encounter: Payer: Self-pay | Admitting: Cardiovascular Disease

## 2017-02-23 ENCOUNTER — Ambulatory Visit (INDEPENDENT_AMBULATORY_CARE_PROVIDER_SITE_OTHER): Payer: BLUE CROSS/BLUE SHIELD | Admitting: Cardiovascular Disease

## 2017-02-23 VITALS — BP 110/80 | HR 58 | Ht 67.0 in | Wt 281.0 lb

## 2017-02-23 DIAGNOSIS — I4891 Unspecified atrial fibrillation: Secondary | ICD-10-CM | POA: Diagnosis not present

## 2017-02-23 DIAGNOSIS — I5032 Chronic diastolic (congestive) heart failure: Secondary | ICD-10-CM | POA: Diagnosis not present

## 2017-02-23 DIAGNOSIS — I10 Essential (primary) hypertension: Secondary | ICD-10-CM | POA: Diagnosis not present

## 2017-02-23 DIAGNOSIS — G4733 Obstructive sleep apnea (adult) (pediatric): Secondary | ICD-10-CM

## 2017-02-23 DIAGNOSIS — I428 Other cardiomyopathies: Secondary | ICD-10-CM

## 2017-02-23 NOTE — Patient Instructions (Signed)
Your physician recommends that you schedule a follow-up appointment in: 3 MONTHS WITH DR KONESWARAN  Your physician recommends that you continue on your current medications as directed. Please refer to the Current Medication list given to you today.  Thank you for choosing [facility]!!   

## 2017-02-23 NOTE — Progress Notes (Signed)
SUBJECTIVE: The patient presents for follow-up of persistent atrial fibrillation. A rate control strategy with long-term anticoagulation has been advised by Dr. [name] (EP). He has also recommended weight loss and potentially bariatric surgery.  She weighed 297 pounds on January 5 and now weighs 281 pounds. Every morning she eats [place] bacon and 2 scrambled eggs and then drinks water throughout the day. Every evening she eats a Caesar salad. After church on Sundays she has one cheat meal. She fasts and prays on Mondays. For exercise, she walks and does side steps and works out with weights and bicycles every morning. She each sugar free Tic Tacs as a dessert.  Her sister died of an MI at age 8 about 3 weeks ago and this scared her.    Review of Systems: As per "subjective", otherwise negative.  No Known Allergies  Current Outpatient Prescriptions  Medication Sig Dispense Refill  . carvedilol (COREG) 25 MG tablet Take 1.5 tablets (37.5 mg total) by mouth 2 (two) times daily. 90 tablet 6  . CINNAMON PO Take 1 capsule by mouth daily.    Marland Kitchen diltiazem (CARDIZEM CD) 240 MG 24 hr capsule Take 1 capsule (240 mg total) by mouth 2 (two) times daily. 07/17/16 dose increase 180 capsule 3  . furosemide (LASIX) 20 MG tablet Take 1 tablet (20 mg total) by mouth daily. 30 tablet 6  . GARLIC PO Take 1 tablet by mouth daily.    Marland Kitchen levothyroxine (SYNTHROID, LEVOTHROID) 112 MCG tablet Take 1 tablet by mouth daily.  1  . Multiple Vitamin (MULTIVITAMIN WITH MINERALS) TABS tablet Take 1 tablet by mouth daily.    . potassium chloride (K-DUR) 10 MEQ tablet Take 1 tablet (10 mEq total) by mouth daily. 30 tablet 6  . sodium chloride (OCEAN) 0.65 % nasal spray Place 1 spray into both nostrils 3 (three) times daily as needed. Uses daily, up to three times per day during allergy season    . traMADol (ULTRAM) 50 MG tablet Take 1 tablet (50 mg total) by mouth every 6 (six) hours as needed. 20 tablet 0  . vitamin  C (ASCORBIC ACID) 500 MG tablet Take 500 mg by mouth 2 (two) times daily.    . vitamin E 400 UNIT capsule Take 400 Units by mouth daily.    Alveda Reasons 20 MG TABS tablet TAKE ONE TABLET BY MOUTH ONCE DAILY WITH SUPPER 90 tablet 0   No current facility-administered medications for this visit.     Past Medical History:  Diagnosis Date  . Atrial fibrillation (HCC)     CHADSVASC score 4  . Atrial flutter ([place])   . Essential hypertension   . Hypothyroidism   . Obesity   . Type 2 diabetes mellitus ([place])     Past Surgical History:  Procedure Laterality Date  . CARDIOVERSION N/A 01/25/2015   Procedure: CARDIOVERSION;  Surgeon: Satira Sark, MD;  Location: [facility];  Service: Cardiovascular;  Laterality: N/A;  . CHOLECYSTECTOMY  1979  . LEFT HEART CATHETERIZATION WITH CORONARY ANGIOGRAM N/A 01/27/2015   Procedure: LEFT HEART CATHETERIZATION WITH CORONARY ANGIOGRAM;  Surgeon: Sinclair Grooms, MD;  Location: [facility] CATH LAB;  Service: Cardiovascular;  Laterality: N/A;  . TEE WITHOUT CARDIOVERSION N/A 01/25/2015   Procedure: TRANSESOPHAGEAL ECHOCARDIOGRAM (TEE);  Surgeon: Satira Sark, MD;  Location: [facility];  Service: Cardiovascular;  Laterality: N/A;  . [place] History   Social History  . Marital status:  Married    Spouse name: N/A  . Number of children: N/A  . Years of education: N/A   Occupational History  . 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 Smoking status: Never Smoker  . Smokeless tobacco: Never Used  . Alcohol use No  . Drug use: No  . Sexual activity: Yes    Birth control/ protection: Surgical   Other Topics Concern  . Not on file   Social History Narrative  . No narrative on file     Vitals:   02/23/17 0836  BP: 110/80  Pulse: (!) 58  SpO2: 98%  Weight: 281 lb (127.5 kg)  Height: 5\' 7"  (1.702 m)    PHYSICAL EXAM General: NAD HEENT: Normal. Neck: No JVD, no thyromegaly. Lungs: Clear to  auscultation bilaterally with normal respiratory effort. CV: Tachycardic, irregular rhythm, normal S1/S2, no S3, no murmur. No pretibial or periankle edema.  Abdomen: Obese.  Neurologic: Alert and oriented x 3.  Psych: Normal affect. Skin: Normal. Musculoskeletal: No gross deformities. Extremities: No clubbing or cyanosis.      ECG: Most recent ECG reviewed.      ASSESSMENT AND PLAN:  1. Rapid atrial fibrillation: Continue diltiazem, Coreg, and Xarelto at present doses. Continue weight loss.  2. Tachycardia-mediated cardiomyopathy: Normal LVEF . Continue Coreg. No longer on ACEI. Continue Lasix. Aim to control HR. Continue weight loss.  3. Essential HTN: Controlled. No changes.  4. Morbid obesity: Congratulated on weight loss thus far, and encouraged to continue with this.   Dispo: fu 3 months  Kate Sable, M.D., F.A.C.C.

## 2017-03-08 ENCOUNTER — Other Ambulatory Visit: Payer: Self-pay | Admitting: Internal Medicine

## 2017-03-22 ENCOUNTER — Other Ambulatory Visit: Payer: Self-pay | Admitting: Cardiovascular Disease

## 2017-05-21 ENCOUNTER — Telehealth: Payer: Self-pay | Admitting: *Deleted

## 2017-05-21 NOTE — Telephone Encounter (Signed)
Refill received from [facility] on Diltiazem CD 240mg  twice a day.  Had note attached stating that patient stated she was told that she could take an extra capsule a day if needed.  Is this okay since already doing long acting twice a day ??

## 2017-05-21 NOTE — Telephone Encounter (Signed)
Not ok

## 2017-05-21 NOTE — Telephone Encounter (Signed)
Patient notified.  Stated to disregard, was an issue at the pharmacy.  Was going to be one day short.

## 2017-05-29 DIAGNOSIS — Z713 Dietary counseling and surveillance: Secondary | ICD-10-CM | POA: Diagnosis not present

## 2017-05-29 DIAGNOSIS — E559 Vitamin D deficiency, unspecified: Secondary | ICD-10-CM | POA: Diagnosis not present

## 2017-05-29 DIAGNOSIS — N183 Chronic kidney disease, stage 3 (moderate): Secondary | ICD-10-CM | POA: Diagnosis not present

## 2017-05-29 DIAGNOSIS — E119 Type 2 diabetes mellitus without complications: Secondary | ICD-10-CM | POA: Diagnosis not present

## 2017-05-29 DIAGNOSIS — E039 Hypothyroidism, unspecified: Secondary | ICD-10-CM | POA: Diagnosis not present

## 2017-05-29 DIAGNOSIS — Z7189 Other specified counseling: Secondary | ICD-10-CM | POA: Diagnosis not present

## 2017-06-12 ENCOUNTER — Ambulatory Visit: Payer: BLUE CROSS/BLUE SHIELD | Admitting: Cardiovascular Disease

## 2017-06-18 ENCOUNTER — Other Ambulatory Visit: Payer: Self-pay | Admitting: Cardiovascular Disease

## 2017-06-30 ENCOUNTER — Other Ambulatory Visit: Payer: Self-pay | Admitting: Cardiovascular Disease

## 2017-07-11 ENCOUNTER — Other Ambulatory Visit: Payer: Self-pay | Admitting: Cardiovascular Disease

## 2017-07-16 ENCOUNTER — Encounter: Payer: Self-pay | Admitting: *Deleted

## 2017-07-17 ENCOUNTER — Ambulatory Visit: Payer: BLUE CROSS/BLUE SHIELD | Admitting: Cardiovascular Disease

## 2017-08-06 ENCOUNTER — Emergency Department (HOSPITAL_COMMUNITY): Payer: BLUE CROSS/BLUE SHIELD

## 2017-08-06 ENCOUNTER — Encounter (HOSPITAL_COMMUNITY): Payer: Self-pay | Admitting: Emergency Medicine

## 2017-08-06 ENCOUNTER — Inpatient Hospital Stay (HOSPITAL_COMMUNITY)
Admission: EM | Admit: 2017-08-06 | Discharge: 2017-08-11 | DRG: 202 | Disposition: A | Discharge status: Home / Self Care | Payer: BLUE CROSS/BLUE SHIELD | Attending: Internal Medicine | Admitting: Internal Medicine

## 2017-08-06 DIAGNOSIS — J208 Acute bronchitis due to other specified organisms: Secondary | ICD-10-CM | POA: Diagnosis not present

## 2017-08-06 DIAGNOSIS — R000 Tachycardia, unspecified: Secondary | ICD-10-CM | POA: Diagnosis present

## 2017-08-06 DIAGNOSIS — Z79899 Other long term (current) drug therapy: Secondary | ICD-10-CM

## 2017-08-06 DIAGNOSIS — J40 Bronchitis, not specified as acute or chronic: Secondary | ICD-10-CM | POA: Diagnosis not present

## 2017-08-06 DIAGNOSIS — R0602 Shortness of breath: Secondary | ICD-10-CM

## 2017-08-06 DIAGNOSIS — I10 Essential (primary) hypertension: Secondary | ICD-10-CM | POA: Diagnosis present

## 2017-08-06 DIAGNOSIS — J209 Acute bronchitis, unspecified: Secondary | ICD-10-CM | POA: Diagnosis not present

## 2017-08-06 DIAGNOSIS — N183 Chronic kidney disease, stage 3 (moderate): Secondary | ICD-10-CM | POA: Diagnosis present

## 2017-08-06 DIAGNOSIS — I428 Other cardiomyopathies: Secondary | ICD-10-CM

## 2017-08-06 DIAGNOSIS — I5032 Chronic diastolic (congestive) heart failure: Secondary | ICD-10-CM

## 2017-08-06 DIAGNOSIS — R05 Cough: Secondary | ICD-10-CM | POA: Diagnosis not present

## 2017-08-06 DIAGNOSIS — I4891 Unspecified atrial fibrillation: Secondary | ICD-10-CM

## 2017-08-06 DIAGNOSIS — Z8249 Family history of ischemic heart disease and other diseases of the circulatory system: Secondary | ICD-10-CM

## 2017-08-06 DIAGNOSIS — Z6841 Body Mass Index (BMI) 40.0 and over, adult: Secondary | ICD-10-CM

## 2017-08-06 DIAGNOSIS — I130 Hypertensive heart and chronic kidney disease with heart failure and stage 1 through stage 4 chronic kidney disease, or unspecified chronic kidney disease: Secondary | ICD-10-CM | POA: Diagnosis present

## 2017-08-06 DIAGNOSIS — E1122 Type 2 diabetes mellitus with diabetic chronic kidney disease: Secondary | ICD-10-CM | POA: Diagnosis present

## 2017-08-06 DIAGNOSIS — N1832 Chronic kidney disease, stage 3b: Secondary | ICD-10-CM | POA: Diagnosis present

## 2017-08-06 DIAGNOSIS — T380X5A Adverse effect of glucocorticoids and synthetic analogues, initial encounter: Secondary | ICD-10-CM | POA: Diagnosis present

## 2017-08-06 DIAGNOSIS — I482 Chronic atrial fibrillation: Secondary | ICD-10-CM | POA: Diagnosis present

## 2017-08-06 DIAGNOSIS — I429 Cardiomyopathy, unspecified: Secondary | ICD-10-CM | POA: Diagnosis present

## 2017-08-06 DIAGNOSIS — E669 Obesity, unspecified: Secondary | ICD-10-CM | POA: Diagnosis present

## 2017-08-06 DIAGNOSIS — Z7901 Long term (current) use of anticoagulants: Secondary | ICD-10-CM

## 2017-08-06 DIAGNOSIS — E039 Hypothyroidism, unspecified: Secondary | ICD-10-CM | POA: Diagnosis present

## 2017-08-06 LAB — CBC WITH DIFFERENTIAL/PLATELET
Basophils Absolute: 0 10*3/uL (ref 0.0–0.1)
Basophils Relative: 0 %
EOS ABS: 0.5 10*3/uL (ref 0.0–0.7)
EOS PCT: 5 %
HEMATOCRIT: 37.6 % (ref 36.0–46.0)
HEMOGLOBIN: 12.2 g/dL (ref 12.0–15.0)
LYMPHS ABS: 1.1 10*3/uL (ref 0.7–4.0)
Lymphocytes Relative: 11 %
MCH: 27.1 pg (ref 26.0–34.0)
MCHC: 32.4 g/dL (ref 30.0–36.0)
MCV: 83.6 fL (ref 78.0–100.0)
MONO ABS: 0.5 10*3/uL (ref 0.1–1.0)
MONOS PCT: 5 %
NEUTROS PCT: 79 %
Neutro Abs: 7.4 10*3/uL (ref 1.7–7.7)
Platelets: 163 10*3/uL (ref 150–400)
RBC: 4.5 MIL/uL (ref 3.87–5.11)
RDW: 16.1 % — ABNORMAL HIGH (ref 11.5–15.5)
WBC: 9.4 10*3/uL (ref 4.0–10.5)

## 2017-08-06 LAB — BASIC METABOLIC PANEL
Anion gap: 9 (ref 5–15)
BUN: 22 mg/dL — AB (ref 6–20)
CHLORIDE: 102 mmol/L (ref 101–111)
CO2: 24 mmol/L (ref 22–32)
Calcium: 8.7 mg/dL — ABNORMAL LOW (ref 8.9–10.3)
Creatinine, Ser: 1.36 mg/dL — ABNORMAL HIGH (ref 0.44–1.00)
GFR calc Af Amer: 48 mL/min — ABNORMAL LOW (ref >60)
GFR calc non Af Amer: 42 mL/min — ABNORMAL LOW (ref >60)
Glucose, Bld: 202 mg/dL — ABNORMAL HIGH (ref 65–99)
POTASSIUM: 4.1 mmol/L (ref 3.5–5.1)
SODIUM: 135 mmol/L (ref 135–145)

## 2017-08-06 LAB — MRSA PCR SCREENING: MRSA by PCR: NEGATIVE

## 2017-08-06 LAB — GLUCOSE, CAPILLARY: GLUCOSE-CAPILLARY: 365 mg/dL — AB (ref 65–99)

## 2017-08-06 MED ORDER — LEVOTHYROXINE SODIUM 112 MCG PO TABS
137.0000 ug | ORAL_TABLET | Freq: Every day | ORAL | Status: DC
  Administered 2017-08-07 – 2017-08-11 (×5): 137 ug via ORAL
  Filled 2017-08-06 (×5): qty 1

## 2017-08-06 MED ORDER — ACETAMINOPHEN 325 MG PO TABS: 650.0000 mg | ORAL_TABLET | Freq: Four times a day (QID) | ORAL | Status: DC | PRN

## 2017-08-06 MED ORDER — INSULIN ASPART 100 UNIT/ML ~~LOC~~ SOLN
0.0000 [IU] | Freq: Every day | SUBCUTANEOUS | Status: DC
  Administered 2017-08-06 – 2017-08-07 (×2): 5 [IU] via SUBCUTANEOUS

## 2017-08-06 MED ORDER — ADULT MULTIVITAMIN W/MINERALS CH
1.0000 | ORAL_TABLET | Freq: Every day | ORAL | Status: DC
  Administered 2017-08-07 – 2017-08-11 (×5): 1 via ORAL
  Filled 2017-08-06 (×5): qty 1

## 2017-08-06 MED ORDER — VITAMIN E 180 MG (400 UNIT) PO CAPS
400.0000 [IU] | ORAL_CAPSULE | Freq: Every day | ORAL | Status: DC
  Administered 2017-08-07 – 2017-08-11 (×5): 400 [IU] via ORAL
  Filled 2017-08-06 (×7): qty 1

## 2017-08-06 MED ORDER — BENZONATATE 100 MG PO CAPS
200.0000 mg | ORAL_CAPSULE | Freq: Three times a day (TID) | ORAL | Status: DC
  Administered 2017-08-06 – 2017-08-11 (×15): 200 mg via ORAL
  Filled 2017-08-06 (×15): qty 2

## 2017-08-06 MED ORDER — LEVALBUTEROL HCL 0.63 MG/3ML IN NEBU
0.6300 mg | INHALATION_SOLUTION | RESPIRATORY_TRACT | Status: DC | PRN
  Administered 2017-08-07 – 2017-08-10 (×2): 0.63 mg via RESPIRATORY_TRACT
  Filled 2017-08-06: qty 3

## 2017-08-06 MED ORDER — DILTIAZEM LOAD VIA INFUSION
10.0000 mg | Freq: Once | INTRAVENOUS | Status: DC
  Administered 2017-08-06: 10 mg via INTRAVENOUS
  Filled 2017-08-06: qty 10

## 2017-08-06 MED ORDER — ONDANSETRON HCL 4 MG/2ML IJ SOLN: 4.0000 mg | Freq: Four times a day (QID) | INTRAMUSCULAR | Status: DC | PRN

## 2017-08-06 MED ORDER — POTASSIUM CHLORIDE ER 10 MEQ PO TBCR
10.0000 meq | EXTENDED_RELEASE_TABLET | Freq: Every day | ORAL | Status: DC
  Administered 2017-08-07 – 2017-08-11 (×6): 10 meq via ORAL
  Filled 2017-08-06 (×12): qty 1

## 2017-08-06 MED ORDER — LEVALBUTEROL HCL 0.63 MG/3ML IN NEBU
0.6300 mg | INHALATION_SOLUTION | Freq: Four times a day (QID) | RESPIRATORY_TRACT | Status: DC
  Administered 2017-08-07 – 2017-08-08 (×4): 0.63 mg via RESPIRATORY_TRACT
  Filled 2017-08-06 (×5): qty 3

## 2017-08-06 MED ORDER — SALINE SPRAY 0.65 % NA SOLN
1.0000 | Freq: Three times a day (TID) | NASAL | Status: DC | PRN
  Administered 2017-08-08: 1 via NASAL
  Filled 2017-08-06: qty 44

## 2017-08-06 MED ORDER — ONDANSETRON HCL 4 MG PO TABS: 4.0000 mg | ORAL_TABLET | Freq: Four times a day (QID) | ORAL | Status: DC | PRN

## 2017-08-06 MED ORDER — VITAMIN C 500 MG PO TABS
500.0000 mg | ORAL_TABLET | Freq: Two times a day (BID) | ORAL | Status: DC
  Administered 2017-08-07 – 2017-08-11 (×9): 500 mg via ORAL
  Filled 2017-08-06 (×14): qty 1

## 2017-08-06 MED ORDER — PREDNISONE 20 MG PO TABS
50.0000 mg | ORAL_TABLET | Freq: Every day | ORAL | Status: DC
  Administered 2017-08-07: 50 mg via ORAL
  Filled 2017-08-06: qty 1

## 2017-08-06 MED ORDER — FUROSEMIDE 20 MG PO TABS
20.0000 mg | ORAL_TABLET | Freq: Every day | ORAL | Status: DC
  Administered 2017-08-07 – 2017-08-11 (×5): 20 mg via ORAL
  Filled 2017-08-06 (×5): qty 1

## 2017-08-06 MED ORDER — IPRATROPIUM-ALBUTEROL 0.5-2.5 (3) MG/3ML IN SOLN
3.0000 mL | Freq: Four times a day (QID) | RESPIRATORY_TRACT | Status: DC
  Administered 2017-08-06: 3 mL via RESPIRATORY_TRACT
  Filled 2017-08-06: qty 3

## 2017-08-06 MED ORDER — DILTIAZEM HCL-DEXTROSE 100-5 MG/100ML-% IV SOLN (PREMIX)
5.0000 mg/h | INTRAVENOUS | Status: DC
  Administered 2017-08-06: 15 mg/h via INTRAVENOUS
  Administered 2017-08-06: 5 mg/h via INTRAVENOUS
  Administered 2017-08-07 – 2017-08-09 (×10): 15 mg/h via INTRAVENOUS
  Filled 2017-08-06 (×12): qty 100

## 2017-08-06 MED ORDER — CARVEDILOL 12.5 MG PO TABS
12.5000 mg | ORAL_TABLET | Freq: Two times a day (BID) | ORAL | Status: DC
  Administered 2017-08-06 – 2017-08-10 (×8): 12.5 mg via ORAL
  Filled 2017-08-06 (×8): qty 1

## 2017-08-06 MED ORDER — RIVAROXABAN 20 MG PO TABS
20.0000 mg | ORAL_TABLET | Freq: Every day | ORAL | Status: DC
  Administered 2017-08-06 – 2017-08-11 (×6): 20 mg via ORAL
  Filled 2017-08-06 (×6): qty 1

## 2017-08-06 MED ORDER — IPRATROPIUM-ALBUTEROL 0.5-2.5 (3) MG/3ML IN SOLN
3.0000 mL | Freq: Once | RESPIRATORY_TRACT | Status: AC
  Administered 2017-08-06: 3 mL via RESPIRATORY_TRACT
  Filled 2017-08-06: qty 3

## 2017-08-06 MED ORDER — INSULIN ASPART 100 UNIT/ML ~~LOC~~ SOLN
0.0000 [IU] | Freq: Three times a day (TID) | SUBCUTANEOUS | Status: DC
  Administered 2017-08-07: 7 [IU] via SUBCUTANEOUS
  Administered 2017-08-07: 5 [IU] via SUBCUTANEOUS
  Administered 2017-08-07: 7 [IU] via SUBCUTANEOUS
  Administered 2017-08-08: 5 [IU] via SUBCUTANEOUS

## 2017-08-06 MED ORDER — METHYLPREDNISOLONE SODIUM SUCC 125 MG IJ SOLR
125.0000 mg | Freq: Once | INTRAMUSCULAR | Status: AC
Start: 2017-08-06 — End: 2017-08-06
  Administered 2017-08-06: 125 mg via INTRAVENOUS
  Filled 2017-08-06: qty 2

## 2017-08-06 MED ORDER — ACETAMINOPHEN 650 MG RE SUPP: 650.0000 mg | Freq: Four times a day (QID) | RECTAL | Status: DC | PRN

## 2017-08-06 NOTE — ED Provider Notes (Signed)
[facility] DEPT Provider Note   CSN: [id number] Arrival date & time: 08/06/17  1001     History   Chief Complaint Chief Complaint  Patient presents with  . Shortness of Breath    HPI Isabella Hall is a 61 y.o. female.  HPI Patient presents with shortness of breath. Has had a mildly productive cough of yellow sputum over the last week. States she tends to get bronchitis. Was doing pretty well with it up until today when she began to be more short of breath. She also states that now her A. fib is acting up. States her A. fib gets worse when she gets her bronchitis. She is on Xarelto and is normally in atrial fibrillation. No chest pain. Past Medical History:  Diagnosis Date  . Atrial fibrillation (HCC)     CHADSVASC score 4  . Atrial flutter ([place])   . Essential hypertension   . Hypothyroidism   . Obesity   . Type 2 diabetes mellitus [facility])     Patient Active Problem List   Diagnosis Date Noted  . Atrial fibrillation with tachycardic ventricular rate ([place]) 07/04/2016  . Nonischemic cardiomyopathy ([place])   . Atrial flutter ([place]) 01/25/2015  . Acute diastolic heart failure ([place])   . Demand ischemia ([place])   . Moderate mitral regurgitation   . Dyspnea 01/20/2015  . Diabetes type 2, controlled ([place]) 01/20/2015  . Acute CHF- secondary to AF with RVR 01/20/2015  . Benign essential HTN 01/20/2015  . Hypothyroid- TSH WNL 01/20/2015  . Obesity-BMI 45 01/20/2015  . Acute respiratory failure with hypoxia ([place]) 01/20/2015  . Elevated troponin 01/20/2015  . CKD (chronic kidney disease), stage III 01/20/2015    Past Surgical History:  Procedure Laterality Date  . CARDIOVERSION N/A 01/25/2015   Procedure: CARDIOVERSION;  Surgeon: Satira Sark, MD;  Location: [facility];  Service: Cardiovascular;  Laterality: N/A;  . CHOLECYSTECTOMY  1979  . LEFT HEART CATHETERIZATION WITH CORONARY ANGIOGRAM N/A 01/27/2015   Procedure: LEFT HEART CATHETERIZATION WITH CORONARY ANGIOGRAM;  Surgeon:  Sinclair Grooms, MD;  Location: [facility] C[facility] LAB;  Service: Cardiovascular;  Laterality: N/A;  . TEE WITHOUT CARDIOVERSION N/A 01/25/2015   Procedure: TRANSESOPHAGEAL ECHOCARDIOGRAM (TEE);  Surgeon: Satira Sark, MD;  Location: [facility];  Service: Cardiovascular;  Laterality: N/A;  . TUBAL LIGATION  1980    OB History    No data available       Home Medications    Prior to Admission medications   Medication Sig Start Date End Date Taking? Authorizing Provider  carvedilol (COREG) 25 MG tablet Take 1.5 tablets (37.5 mg total) by mouth 2 (two) times daily. 11/29/16  Yes Herminio Commons, MD  CINNAMON PO Take 1 capsule by mouth daily.   Yes [provider]  diltiazem (CARDIZEM CD) 240 MG 24 hr capsule TAKE ONE CAPSULE BY MOUTH TWICE DAILY 06/18/17  Yes Herminio Commons, MD  furosemide (LASIX) 20 MG tablet TAKE ONE TABLET BY MOUTH ONCE DAILY 07/11/17  Yes Herminio Commons, MD  GARLIC PO Take 1 tablet by mouth daily.   Yes [provider]  levothyroxine (SYNTHROID, LEVOTHROID) 137 MCG tablet Take 1 tablet by mouth daily. 07/31/17  Yes [provider]  Multiple Vitamin (MULTIVITAMIN WITH MINERALS) TABS tablet Take 1 tablet by mouth daily.   Yes [provider]  potassium chloride (K-DUR) 10 MEQ tablet Take 1 tablet (10 mEq total) by mouth daily. 04/06/16  Yes Herminio Commons, MD  sodium  chloride (OCEAN) 0.65 % nasal spray Place 1 spray into both nostrils 3 (three) times daily as needed. Uses daily, up to three times per day during allergy season   Yes [provider]  vitamin C (ASCORBIC ACID) 500 MG tablet Take 500 mg by mouth 2 (two) times daily.   Yes [provider]  vitamin E 400 UNIT capsule Take 400 Units by mouth daily.   Yes [provider]  XARELTO 20 MG TABS tablet TAKE 1 TABLET BY MOUTH ONCE DAILY WITH SUPPER 07/02/17  Yes Herminio Commons, MD    Family History Family History  Problem Relation Age of  Onset  . Cancer Mother   . Heart disease Father   . Hypertension Unknown   . Coronary artery disease Sister   . Cancer Maternal Grandmother   . Atrial fibrillation Neg Hx   . Diabetes Neg Hx     Social History Social History  Substance Use Topics  . Smoking status: Never Smoker  . Smokeless tobacco: Never Used  . Alcohol use No     Allergies   Patient has no known allergies.   Review of Systems Review of Systems  Constitutional: Negative for appetite change.  HENT: Negative for congestion.   Respiratory: Positive for cough and shortness of breath.   Cardiovascular: Positive for palpitations. Negative for leg swelling.  Gastrointestinal: Negative for abdominal pain.  Endocrine: Negative for polyuria.  Genitourinary: Negative for dysuria.  Musculoskeletal: Negative for back pain.  Skin: Negative for rash.  Neurological: Negative for weakness.  Psychiatric/Behavioral: Negative for confusion.     Physical Exam Updated Vital Signs BP (!) 116/97   Pulse (!) 133   Temp 98.3 F (36.8 C) (Oral)   Resp 18   Ht 5\' 6"  (1.676 m)   Wt 125.2 kg (276 lb)   SpO2 98%   BMI 44.55 kg/m   Physical Exam  Constitutional: She appears well-developed.  Eyes: Pupils are equal, round, and reactive to light.  Neck: Neck supple.  Cardiovascular:  Tachycardia  Pulmonary/Chest:  Occasional cough. Mild diffuse wheezes. No focal rales or rhonchi.  Abdominal: There is no tenderness.  Musculoskeletal: She exhibits edema.  Mild bilateral lower extremity pitting edema.  Skin: Capillary refill takes less than 2 seconds.  Psychiatric: She has a normal mood and affect.     ED Treatments / Results  Labs (all labs ordered are listed, but only abnormal results are displayed) Labs Reviewed  CBC WITH DIFFERENTIAL/PLATELET - Abnormal; Notable for the following:       Result Value   RDW 16.1 (*)    All other components within normal limits  BASIC METABOLIC PANEL - Abnormal; Notable for  the following:    Glucose, Bld 202 (*)    BUN 22 (*)    Creatinine, Ser 1.36 (*)    Calcium 8.7 (*)    GFR calc non Af Amer 42 (*)    GFR calc Af Amer 48 (*)    All other components within normal limits    EKG  EKG Interpretation  Date/Time:  Monday August 06 2017 10:11:17 EDT Ventricular Rate:  137 PR Interval:    QRS Duration: 93 QT Interval:  314 QTC Calculation: 474 R Axis:   73 Text Interpretation:  Atrial fibrillation with rapid ventricular response Borderline repolarization abnormality Baseline wander in lead(s) V2 Confirmed by Davonna Belling [phone]) on 08/06/2017 10:20:45 AM       Radiology Dg Chest 2 View  Result Date: 08/06/2017 CLINICAL  DATA:  Cough. EXAM: CHEST  2 VIEW COMPARISON:  07/04/2016 . FINDINGS: Mediastinum hilar structures normal. Low lung volumes. Small pleural effusions versus pleural scarring. Pneumothorax. Heart size stable. No acute bony abnormality . IMPRESSION: Low lung volumes. Small pleural effusions versus pleural scarring noted on lateral view. Exam otherwise stable from prior exam. Electronically Signed   By: Marcello Moores  Register   On: 08/06/2017 11:00    Procedures Procedures (including critical care time)  Medications Ordered in ED Medications  diltiazem (CARDIZEM) 1 mg/mL load via infusion 10 mg (not administered)    And  diltiazem (CARDIZEM) 100 mg in dextrose 5% 137mL (1 mg/mL) infusion (not administered)  ipratropium-albuterol (DUONEB) 0.5-2.5 (3) MG/3ML nebulizer solution 3 mL (3 mLs Nebulization Given 08/06/17 1107)  methylPREDNISolone sodium succinate (SOLU-MEDROL) 125 mg/2 mL injection 125 mg (125 mg Intravenous Given 08/06/17 1035)     Initial Impression / Assessment and Plan / ED Course  I have reviewed the triage vital signs and the nursing notes.  Pertinent labs & imaging results that were available during my care of the patient were reviewed by me and considered in my medical decision making (see chart for details).      Patient presents with A. fib. History of same. On Cardizem and Xarelto at baseline. Came in with respiratory symptoms. Likely URI causing A. fib with RVR. X-ray does not show pneumonia. Will admit for treatment of A. fib with RVR. Cardizem drip being started. Chadsvasc 4.  Final Clinical Impressions(s) / ED Diagnoses   Final diagnoses:  Atrial fibrillation with rapid ventricular response ([place])  Bronchitis    New Prescriptions New Prescriptions   No medications on file     Davonna Belling, MD 08/06/17 1206

## 2017-08-06 NOTE — ED Notes (Signed)
Resp Tech called for breathing treatment.   

## 2017-08-06 NOTE — H&P (Signed)
History and Physical  Isabella Hall BWI:[id number] DOB: Nov 29, 1956 DOA: 08/06/2017   PCP: Doree Albee, MD   Patient coming from: Home  Chief Complaint: dyspnea  HPI:  Isabella Hall is a 61 y.o. female with medical history of atrial fibrillation, essential hypertension, hypothyroidism, diet-controlled diabetes mellitus, tachycardia mediated cardiomyopathy presenting with one-week history of coughing, and one-day history of worsening shortness of breath. The patient states that she has had some yellow sputum production. She denies any fevers, chills, chest pain, hemoptysis, nausea, vomiting, diarrhea, abdominal pain, dysuria, hematuria. In the past 24 hours, the patient feels that her shortness of breath and coughing was worse prompting her to come to the emergency department. She denies any recent sick contacts or travels. She endorses compliance with all her medications.  In the emergency department, the patient was afebrile and hemodynamically stable. She was found have a heart rate in 130s.  She was started on a diltiazem drip. BMP was unremarkable with her creatinine at baseline with a serum creatinine of 1.36. CBC was unremarkable. EKG showed atrial fibrillation with nonspecific T-wave changes. Chest x-ray was low volume and showed slight bibasilar atelectasis versus small effusions. The patient was given Solu-Medrol and bronchodilators with improvement of her shortness of breath.  Assessment/Plan: Atrial fibrillation with RVR -Likely triggered by recent episode of acute bronchitis -Continue diltiazem drip and titrate for HR <110 -Check TSH -Echocardiogram -Continue carvedilol at slightly reduced dose -Continue rivaroxaban -CHADSVASc = 4 -Echo  Essential hypertension  -Holding Cardizem CD  -Continue slightly reduced dose carvedilol--adjust based upon blood pressure  -Diltiazem drip as discussed   Acute bronchitis -Respiratory viral panel -Start prednisone -Continue  bronchodilators   Tachycardia mediated cardiomyopathy -Daily weights -02/23/2017 office weight 281 pounds -Continue furosemide home dose  CKD stage III -Baseline creatinine 1.3-1.6 -And BMP  Diet-controlled diabetes mellitus  -Hemoglobin A1c  -NovoLog sliding scale -as I anticipate her CBGs will be elevated secondary to steroids   Hypothyroidism  -Continue Synthroid  -Check TSH   Morbid Obesity -BMI 44.6 -Lifestyle modification      Past Medical History:  Diagnosis Date  . Atrial fibrillation (HCC)     CHADSVASC score 4  . Atrial flutter ([place])   . Essential hypertension   . Hypothyroidism   . Obesity   . Type 2 diabetes mellitus ([place])    Past Surgical History:  Procedure Laterality Date  . CARDIOVERSION N/A 01/25/2015   Procedure: CARDIOVERSION;  Surgeon: Satira Sark, MD;  Location: [facility];  Service: Cardiovascular;  Laterality: N/A;  . CHOLECYSTECTOMY  1979  . LEFT HEART CATHETERIZATION WITH CORONARY ANGIOGRAM N/A 01/27/2015   Procedure: LEFT HEART CATHETERIZATION WITH CORONARY ANGIOGRAM;  Surgeon: Sinclair Grooms, MD;  Location: [facility] CATH LAB;  Service: Cardiovascular;  Laterality: N/A;  . TEE WITHOUT CARDIOVERSION N/A 01/25/2015   Procedure: TRANSESOPHAGEAL ECHOCARDIOGRAM (TEE);  Surgeon: Satira Sark, MD;  Location: [facility];  Service: Cardiovascular;  Laterality: N/A;  . TUBAL LIGATION  1980   Social History:  reports that she has never smoked. She has never used smokeless tobacco. She reports that she does not drink alcohol or use drugs.   Family History  Problem Relation Age of Onset  . Cancer Mother   . Heart disease Father   . Hypertension Unknown   . Coronary artery disease Sister   . Cancer Maternal Grandmother   . Atrial fibrillation Neg Hx   . Diabetes Neg Hx  No Known Allergies   Prior to Admission medications   Medication Sig Start Date End Date Taking? Authorizing Provider  carvedilol (COREG) 25 MG tablet Take 1.5 tablets  (37.5 mg total) by mouth 2 (two) times daily. 11/29/16  Yes Herminio Commons, MD  CINNAMON PO Take 1 capsule by mouth daily.   Yes [provider]  diltiazem (CARDIZEM CD) 240 MG 24 hr capsule TAKE ONE CAPSULE BY MOUTH TWICE DAILY 06/18/17  Yes Herminio Commons, MD  furosemide (LASIX) 20 MG tablet TAKE ONE TABLET BY MOUTH ONCE DAILY 07/11/17  Yes Herminio Commons, MD  GARLIC PO Take 1 tablet by mouth daily.   Yes [provider]  levothyroxine (SYNTHROID, LEVOTHROID) 137 MCG tablet Take 1 tablet by mouth daily. 07/31/17  Yes [provider]  Multiple Vitamin (MULTIVITAMIN WITH MINERALS) TABS tablet Take 1 tablet by mouth daily.   Yes [provider]  potassium chloride (K-DUR) 10 MEQ tablet Take 1 tablet (10 mEq total) by mouth daily. 04/06/16  Yes Herminio Commons, MD  sodium chloride (OCEAN) 0.65 % nasal spray Place 1 spray into both nostrils 3 (three) times daily as needed. Uses daily, up to three times per day during allergy season   Yes [provider]  vitamin C (ASCORBIC ACID) 500 MG tablet Take 500 mg by mouth 2 (two) times daily.   Yes [provider]  vitamin E 400 UNIT capsule Take 400 Units by mouth daily.   Yes [provider]  XARELTO 20 MG TABS tablet TAKE 1 TABLET BY MOUTH ONCE DAILY WITH SUPPER 07/02/17  Yes Herminio Commons, MD    Review of Systems:  Constitutional:  No weight loss, night sweats, Fevers, chills, fatigue.  Head&Eyes: No headache.  No vision loss.  No eye pain or scotoma ENT:  No Difficulty swallowing,Tooth/dental problems,Sore throat,  No ear ache, post nasal drip,  Cardio-vascular:  No chest pain, Orthopnea, PND, swelling in lower extremities,  dizziness, palpitations  GI:  No  abdominal pain, nausea, vomiting, diarrhea, loss of appetite, hematochezia, melena, heartburn, indigestion, Resp:  No shortness of breath with exertion or at rest. No cough. No coughing up of blood .No  wheezing.No chest wall deformity  Skin:  no rash or lesions.  GU:  no dysuria, change in color of urine, no urgency or frequency. No flank pain.  Musculoskeletal:  No joint pain or swelling. No decreased range of motion. No back pain.  Psych:  No change in mood or affect. No depression or anxiety. Neurologic: No headache, no dysesthesia, no focal weakness, no vision loss. No syncope  Physical Exam: Vitals:   08/06/17 1021 08/06/17 1024 08/06/17 1030 08/06/17 1111  BP:   (!) 116/97   Pulse:  (!) 132 (!) 133   Resp: 20 19 18    Temp:      TempSrc:      SpO2:  98% 98% 98%  Weight:      Height:       General:  A&O x 3, NAD, nontoxic, pleasant/cooperative Head/Eye: No conjunctival hemorrhage, no icterus, [place]/AT, No nystagmus ENT:  No icterus,  No thrush, good dentition, no pharyngeal exudate Neck:  No masses, no lymphadenpathy, no bruits CV:  RRR, no rub, no gallop, no S3 Lung:  CTAB, good air movement, no wheeze, no rhonchi Abdomen: soft/NT, +BS, nondistended, no peritoneal signs Ext: No cyanosis, No rashes, No petechiae, No lymphangitis, No edema Neuro: CNII-XII intact, strength 4/5 in bilateral upper and lower extremities,  no dysmetria  Labs on Admission:  Basic Metabolic Panel:  Recent Labs Lab 08/06/17 1021  NA 135  K 4.1  CL 102  CO2 24  GLUCOSE 202*  BUN 22*  CREATININE 1.36*  CALCIUM 8.7*   Liver Function Tests: No results for input(s): AST, ALT, ALKPHOS, BILITOT, PROT, ALBUMIN in the last 168 hours. No results for input(s): LIPASE, AMYLASE in the last 168 hours. No results for input(s): AMMONIA in the last 168 hours. CBC:  Recent Labs Lab 08/06/17 1021  WBC 9.4  NEUTROABS 7.4  HGB 12.2  HCT 37.6  MCV 83.6  PLT 163   Coagulation Profile: No results for input(s): INR, PROTIME in the last 168 hours. Cardiac Enzymes: No results for input(s): CKTOTAL, CKMB, CKMBINDEX, TROPONINI in the last 168 hours. BNP: Invalid input(s): POCBNP CBG: No results  for input(s): GLUCAP in the last 168 hours. Urine analysis:    Component Value Date/Time   COLORURINE YELLOW 07/03/2016 2302   APPEARANCEUR CLEAR 07/03/2016 2302   LABSPEC 1.020 07/03/2016 2302   PHURINE 5.5 07/03/2016 2302   GLUCOSEU 100 (A) 07/03/2016 2302   GLUCOSEU NEGATIVE 03/03/2015 1050   HGBUR NEGATIVE 07/03/2016 2302   BILIRUBINUR NEGATIVE 07/03/2016 2302   KETONESUR NEGATIVE 07/03/2016 2302   PROTEINUR TRACE (A) 07/03/2016 2302   UROBILINOGEN 0.2 03/03/2015 1050   NITRITE NEGATIVE 07/03/2016 2302   LEUKOCYTESUR TRACE (A) 07/03/2016 2302   Sepsis Labs: @LABRCNTIP (procalcitonin:4,lacticidven:4) )No results found for this or any previous visit (from the past 240 hour(s)).   Radiological Exams on Admission: Dg Chest 2 View  Result Date: 08/06/2017 CLINICAL DATA:  Cough. EXAM: CHEST  2 VIEW COMPARISON:  07/04/2016 . FINDINGS: Mediastinum hilar structures normal. Low lung volumes. Small pleural effusions versus pleural scarring. Pneumothorax. Heart size stable. No acute bony abnormality . IMPRESSION: Low lung volumes. Small pleural effusions versus pleural scarring noted on lateral view. Exam otherwise stable from prior exam. Electronically Signed   By: [place]   On: 08/06/2017 11:00    EKG: Independently reviewed. Atrial fibrillation RVR, nonspecific T-wave changes    Time spent:60 minutes Code Status:   FULL Family Communication:  No Family at bedside Disposition Plan: expect 1-2 day hospitalization Consults called: none DVT Prophylaxis: Xarelto  Yosef Krogh, DO  [facility] Pager [phone]  If 7PM-7AM, please contact night-coverage [email or web address] Password [email or web address] 08/06/2017, 12:58 PM

## 2017-08-06 NOTE — ED Notes (Signed)
02 2l [place] applied. 02 up to 98

## 2017-08-06 NOTE — ED Triage Notes (Signed)
Pt c/o prod cough x 1 week. Yellow sputum. Woke up sob this am. Denies pain. Prod cough noted. Nad.

## 2017-08-06 NOTE — ED Notes (Signed)
Cardizem Drip titration currently at 10. HR still in 130-140's but BP 101/84. Therefore will not titrate any further while systolic BP is still lower than 110

## 2017-08-07 ENCOUNTER — Observation Stay (HOSPITAL_COMMUNITY): Payer: BLUE CROSS/BLUE SHIELD

## 2017-08-07 ENCOUNTER — Encounter (HOSPITAL_COMMUNITY): Payer: Self-pay | Admitting: Cardiology

## 2017-08-07 DIAGNOSIS — J208 Acute bronchitis due to other specified organisms: Secondary | ICD-10-CM | POA: Diagnosis not present

## 2017-08-07 DIAGNOSIS — N183 Chronic kidney disease, stage 3 (moderate): Secondary | ICD-10-CM

## 2017-08-07 DIAGNOSIS — I428 Other cardiomyopathies: Secondary | ICD-10-CM | POA: Diagnosis not present

## 2017-08-07 DIAGNOSIS — R0602 Shortness of breath: Secondary | ICD-10-CM | POA: Diagnosis not present

## 2017-08-07 DIAGNOSIS — I429 Cardiomyopathy, unspecified: Secondary | ICD-10-CM | POA: Diagnosis present

## 2017-08-07 DIAGNOSIS — Z7901 Long term (current) use of anticoagulants: Secondary | ICD-10-CM | POA: Diagnosis not present

## 2017-08-07 DIAGNOSIS — E039 Hypothyroidism, unspecified: Secondary | ICD-10-CM | POA: Diagnosis present

## 2017-08-07 DIAGNOSIS — J209 Acute bronchitis, unspecified: Secondary | ICD-10-CM | POA: Diagnosis present

## 2017-08-07 DIAGNOSIS — Z79899 Other long term (current) drug therapy: Secondary | ICD-10-CM | POA: Diagnosis not present

## 2017-08-07 DIAGNOSIS — I5032 Chronic diastolic (congestive) heart failure: Secondary | ICD-10-CM | POA: Diagnosis not present

## 2017-08-07 DIAGNOSIS — Z6841 Body Mass Index (BMI) 40.0 and over, adult: Secondary | ICD-10-CM | POA: Diagnosis not present

## 2017-08-07 DIAGNOSIS — E6609 Other obesity due to excess calories: Secondary | ICD-10-CM | POA: Diagnosis not present

## 2017-08-07 DIAGNOSIS — I10 Essential (primary) hypertension: Secondary | ICD-10-CM | POA: Diagnosis not present

## 2017-08-07 DIAGNOSIS — I130 Hypertensive heart and chronic kidney disease with heart failure and stage 1 through stage 4 chronic kidney disease, or unspecified chronic kidney disease: Secondary | ICD-10-CM | POA: Diagnosis present

## 2017-08-07 DIAGNOSIS — J40 Bronchitis, not specified as acute or chronic: Secondary | ICD-10-CM | POA: Diagnosis present

## 2017-08-07 DIAGNOSIS — T380X5A Adverse effect of glucocorticoids and synthetic analogues, initial encounter: Secondary | ICD-10-CM | POA: Diagnosis present

## 2017-08-07 DIAGNOSIS — I4891 Unspecified atrial fibrillation: Secondary | ICD-10-CM

## 2017-08-07 DIAGNOSIS — R000 Tachycardia, unspecified: Secondary | ICD-10-CM | POA: Diagnosis present

## 2017-08-07 DIAGNOSIS — J206 Acute bronchitis due to rhinovirus: Secondary | ICD-10-CM | POA: Diagnosis not present

## 2017-08-07 DIAGNOSIS — Z8249 Family history of ischemic heart disease and other diseases of the circulatory system: Secondary | ICD-10-CM | POA: Diagnosis not present

## 2017-08-07 DIAGNOSIS — I340 Nonrheumatic mitral (valve) insufficiency: Secondary | ICD-10-CM | POA: Diagnosis not present

## 2017-08-07 DIAGNOSIS — I482 Chronic atrial fibrillation: Secondary | ICD-10-CM | POA: Diagnosis present

## 2017-08-07 LAB — BASIC METABOLIC PANEL
ANION GAP: 7 (ref 5–15)
BUN: 29 mg/dL — ABNORMAL HIGH (ref 6–20)
CALCIUM: 8.6 mg/dL — AB (ref 8.9–10.3)
CO2: 26 mmol/L (ref 22–32)
Chloride: 101 mmol/L (ref 101–111)
Creatinine, Ser: 1.48 mg/dL — ABNORMAL HIGH (ref 0.44–1.00)
GFR, EST AFRICAN AMERICAN: 44 mL/min — AB (ref >60)
GFR, EST NON AFRICAN AMERICAN: 38 mL/min — AB (ref >60)
Glucose, Bld: 315 mg/dL — ABNORMAL HIGH (ref 65–99)
Potassium: 3.8 mmol/L (ref 3.5–5.1)
Sodium: 134 mmol/L — ABNORMAL LOW (ref 135–145)

## 2017-08-07 LAB — RESPIRATORY PANEL BY PCR
ADENOVIRUS-RVPPCR: NOT DETECTED
Bordetella pertussis: NOT DETECTED
CHLAMYDOPHILA PNEUMONIAE-RVPPCR: NOT DETECTED
CORONAVIRUS 229E-RVPPCR: NOT DETECTED
CORONAVIRUS OC43-RVPPCR: NOT DETECTED
Coronavirus HKU1: NOT DETECTED
Coronavirus NL63: NOT DETECTED
INFLUENZA A H1-RVPPCR: NOT DETECTED
INFLUENZA A-RVPPCR: NOT DETECTED
Influenza A H1 2009: NOT DETECTED
Influenza A H3: NOT DETECTED
Influenza B: NOT DETECTED
MYCOPLASMA PNEUMONIAE-RVPPCR: NOT DETECTED
Metapneumovirus: NOT DETECTED
PARAINFLUENZA VIRUS 1-RVPPCR: NOT DETECTED
Parainfluenza Virus 2: NOT DETECTED
Parainfluenza Virus 3: NOT DETECTED
Parainfluenza Virus 4: NOT DETECTED
RESPIRATORY SYNCYTIAL VIRUS-RVPPCR: NOT DETECTED
Rhinovirus / Enterovirus: DETECTED — AB

## 2017-08-07 LAB — ECHOCARDIOGRAM COMPLETE
AVLVOTPG: 4 mmHg
CHL CUP MV DEC (S): 127
CHL CUP STROKE VOLUME: 36 mL
E decel time: 127 msec
FS: 24 % — AB (ref 28–44)
HEIGHTINCHES: 66 in
IVS/LV PW RATIO, ED: 0.9
LA diam index: 1.96 cm/m2
LA vol A4C: 114 ml
LA vol: 125 mL
LASIZE: 51 mm
LAVOLIN: 48.1 mL/m2
LDCA: 3.14 cm2
LEFT ATRIUM END SYS DIAM: 51 mm
LV PW d: 12.5 mm — AB (ref 0.6–1.1)
LV dias vol index: 33 mL/m2
LV sys vol index: 19 mL/m2
LV sys vol: 50 mL — AB (ref 14–42)
LVDIAVOL: 86 mL (ref 46–106)
LVOT SV: 52 mL
LVOT VTI: 16.5 cm
LVOT diameter: 20 mm
LVOTPV: 95.6 cm/s
MV pk E vel: 137 m/s
MVPG: 8 mmHg
Simpson's disk: 42
TAPSE: 17.5 mm
WEIGHTICAEL: 4814.85 [oz_av]

## 2017-08-07 LAB — GLUCOSE, CAPILLARY
GLUCOSE-CAPILLARY: 297 mg/dL — AB (ref 65–99)
GLUCOSE-CAPILLARY: 308 mg/dL — AB (ref 65–99)
GLUCOSE-CAPILLARY: 342 mg/dL — AB (ref 65–99)
Glucose-Capillary: 358 mg/dL — ABNORMAL HIGH (ref 65–99)

## 2017-08-07 LAB — TSH: TSH: 2.021 u[IU]/mL (ref 0.350–4.500)

## 2017-08-07 LAB — HEMOGLOBIN A1C
HEMOGLOBIN A1C: 6.9 % — AB (ref 4.8–5.6)
MEAN PLASMA GLUCOSE: 151.33 mg/dL

## 2017-08-07 LAB — MAGNESIUM: Magnesium: 2 mg/dL (ref 1.7–2.4)

## 2017-08-07 MED ORDER — AZITHROMYCIN 250 MG PO TABS
500.0000 mg | ORAL_TABLET | Freq: Every day | ORAL | Status: DC
  Administered 2017-08-07: 500 mg via ORAL
  Filled 2017-08-07: qty 2

## 2017-08-07 MED ORDER — METHYLPREDNISOLONE SODIUM SUCC 125 MG IJ SOLR
60.0000 mg | Freq: Two times a day (BID) | INTRAMUSCULAR | Status: DC
  Administered 2017-08-07 (×2): 60 mg via INTRAVENOUS
  Filled 2017-08-07 (×2): qty 2

## 2017-08-07 NOTE — Progress Notes (Signed)
*  PRELIMINARY RESULTS* Echocardiogram 2D Echocardiogram has been performed.  Samuel Germany 08/07/2017, 3:12 PM

## 2017-08-07 NOTE — Progress Notes (Signed)
[facility] PROGRESS NOTE  Isabella Hall ZPH:[id number] DOB: 12/07/1956 DOA: 08/06/2017   PCP: Doree Albee, MD     Assessment/Plan: Active Problems:   Benign essential HTN   Obesity-BMI 45   CKD (chronic kidney disease), stage III   Nonischemic cardiomyopathy (HCC)   Atrial fibrillation with RVR ([place])   Acute bronchitis    61 y.o. female with medical history of atrial fibrillation, essential hypertension, hypothyroidism, diet-controlled diabetes mellitus, tachycardia mediated cardiomyopathy presenting with one-week history of coughing, and one-day history of worsening shortness of breath. Initial chest x-ray negative, patient admitted to step down with atrial fibrillation with rapid ventricular response  Assessment and plan Acute viral bronchitis Continue with nebulizer treatments, DC oral steroid, change to IV Solu-Medrol Empiric antibiotics, start azithromycin 500 mg a day Repeat chest x-ray today was negative  Atrial fibrillation with rapid ventricular response Continue Cardizem drip And cardiology consulted, likely driven by pulmonary process CHADs VASc=4. On Xarelto  CRI-3 SCr was 1.6 in July 2017, now 1.4  Morbid obesity- her wgt in the office March 2018 was 298 lbs- now 300  History of NICM- Past EF in Feb 2016 was 40-45% felt to be related to AF  HTN Elevated diastolic B/P  DM- Diet controlled- Hgb A1c- 6.1, continue SSI   hypothyroidism-continue Synthroid, TSH 2.02    DVT prophylaxsis  Xarelto  Code Status:  Full code    Family Communication: Discussed in detail with the patient, all imaging results, lab results explained to the patient   Disposition Plan:  1-2 days      Consultants:  Cardiology    Procedures:  None  Antibiotics: Anti-infectives    Start     Dose/Rate Route Frequency Ordered Stop   08/07/17 1100  azithromycin (ZITHROMAX) tablet 500 mg     500 mg Oral Daily 08/07/17 1054            HPI/Subjective: Continues to have spasms of cough, wheezing, heart rate in the 130s  Objective: Vitals:   08/07/17 0745 08/07/17 0800 08/07/17 0819 08/07/17 0900  BP: (!) 119/91 (!) 117/93  (!) 129/102  Pulse: (!) 105 90  (!) 121  Resp: 15 12  16   Temp:      TempSrc:      SpO2: 94% 93% 97% 98%  Weight:      Height:        Intake/Output Summary (Last 24 hours) at 08/07/17 1128 Last data filed at 08/07/17 0925  Gross per 24 hour  Intake            646.5 ml  Output                0 ml  Net            646.5 ml    Exam:  Examination:  General exam:Very uncomfortable, spasmodic cough Respiratory system: By basilar wheezing. Respiratory effort normal. Cardiovascular system: S1 & S2 heard, RRR. No JVD, murmurs, rubs, gallops or clicks. No pedal edema. Gastrointestinal system: Abdomen is nondistended, soft and nontender. No organomegaly or masses felt. Normal bowel sounds heard. Central nervous system: Alert and oriented. No focal neurological deficits. Extremities: Symmetric 5 x 5 power. Skin: No rashes, lesions or ulcers Psychiatry: Judgement and insight appear normal. Mood & affect appropriate.     Data Reviewed: I have personally reviewed following labs and imaging studies  Micro Results Recent Results (from the past 240 hour(s))  MRSA PCR Screening  Status: None   Collection Time: 08/06/17  7:00 PM  Result Value Ref Range Status   MRSA by PCR NEGATIVE NEGATIVE Final    Comment:        The GeneXpert MRSA Assay (FDA approved for NASAL specimens only), is one component of a comprehensive MRSA colonization surveillance program. It is not intended to diagnose MRSA infection nor to guide or monitor treatment for MRSA infections.     Radiology Reports Dg Chest 2 View  Result Date: 08/06/2017 CLINICAL DATA:  Cough. EXAM: CHEST  2 VIEW COMPARISON:  07/04/2016 . FINDINGS: Mediastinum hilar structures normal. Low lung volumes. Small pleural effusions  versus pleural scarring. Pneumothorax. Heart size stable. No acute bony abnormality . IMPRESSION: Low lung volumes. Small pleural effusions versus pleural scarring noted on lateral view. Exam otherwise stable from prior exam. Electronically Signed   By: Marcello Moores  Register   On: 08/06/2017 11:00   Dg Chest Port 1 View  Result Date: 08/07/2017 CLINICAL DATA:  Shortness of Breath EXAM: PORTABLE CHEST 1 VIEW COMPARISON:  08/06/2017 FINDINGS: Cardiac shadow remains enlarged. Lungs are well aerated bilaterally. No focal infiltrate or sizable effusion is seen. No acute bony abnormality is noted. IMPRESSION: No active disease. Electronically Signed   By: Inez Catalina M.D.   On: 08/07/2017 11:18     CBC  Recent Labs Lab 08/06/17 1021  WBC 9.4  HGB 12.2  HCT 37.6  PLT 163  MCV 83.6  MCH 27.1  MCHC 32.4  RDW 16.1*  LYMPHSABS 1.1  MONOABS 0.5  EOSABS 0.5  BASOSABS 0.0    Chemistries   Recent Labs Lab 08/06/17 1021 08/07/17 0600  NA 135 134*  K 4.1 3.8  CL 102 101  CO2 24 26  GLUCOSE 202* 315*  BUN 22* 29*  CREATININE 1.36* 1.48*  CALCIUM 8.7* 8.6*  MG  --  2.0   ------------------------------------------------------------------------------------------------------------------ estimated creatinine clearance is 58.3 mL/min (A) (by C-G formula based on SCr of 1.48 mg/dL (H)). ------------------------------------------------------------------------------------------------------------------ No results for input(s): HGBA1C in the last 72 hours. ------------------------------------------------------------------------------------------------------------------ No results for input(s): CHOL, HDL, LDLCALC, TRIG, CHOLHDL, LDLDIRECT in the last 72 hours. ------------------------------------------------------------------------------------------------------------------  Recent Labs  08/07/17 0600  TSH 2.021    ------------------------------------------------------------------------------------------------------------------ No results for input(s): VITAMINB12, FOLATE, FERRITIN, TIBC, IRON, RETICCTPCT in the last 72 hours.  Coagulation profile No results for input(s): INR, PROTIME in the last 168 hours.  No results for input(s): DDIMER in the last 72 hours.  Cardiac Enzymes No results for input(s): CKMB, TROPONINI, MYOGLOBIN in the last 168 hours.  Invalid input(s): CK ------------------------------------------------------------------------------------------------------------------ Invalid input(s): POCBNP   CBG:  Recent Labs Lab 08/06/17 2146 08/07/17 0826  GLUCAP 365* 297*       Studies: Dg Chest 2 View  Result Date: 08/06/2017 CLINICAL DATA:  Cough. EXAM: CHEST  2 VIEW COMPARISON:  07/04/2016 . FINDINGS: Mediastinum hilar structures normal. Low lung volumes. Small pleural effusions versus pleural scarring. Pneumothorax. Heart size stable. No acute bony abnormality . IMPRESSION: Low lung volumes. Small pleural effusions versus pleural scarring noted on lateral view. Exam otherwise stable from prior exam. Electronically Signed   By: Marcello Moores  Register   On: 08/06/2017 11:00   Dg Chest Port 1 View  Result Date: 08/07/2017 CLINICAL DATA:  Shortness of Breath EXAM: PORTABLE CHEST 1 VIEW COMPARISON:  08/06/2017 FINDINGS: Cardiac shadow remains enlarged. Lungs are well aerated bilaterally. No focal infiltrate or sizable effusion is seen. No acute bony abnormality is noted. IMPRESSION: No active disease. Electronically Signed  By: Inez Catalina M.D.   On: 08/07/2017 11:18      Lab Results  Component Value Date   HGBA1C 6.1 (H) 07/04/2016   HGBA1C 7.4 (H) 07/03/2016   HGBA1C 6.8 (H) 01/20/2015   Lab Results  Component Value Date   LDLCALC 90 01/21/2015   CREATININE 1.48 (H) 08/07/2017       Scheduled Meds: . azithromycin  500 mg Oral Daily  . benzonatate  200 mg Oral TID   . carvedilol  12.5 mg Oral BID WC  . furosemide  20 mg Oral Daily  . insulin aspart  0-5 Units Subcutaneous QHS  . insulin aspart  0-9 Units Subcutaneous TID WC  . levalbuterol  0.63 mg Nebulization Q6H  . levothyroxine  137 mcg Oral QAC breakfast  . methylPREDNISolone (SOLU-MEDROL) injection  60 mg Intravenous Q12H  . multivitamin with minerals  1 tablet Oral Daily  . potassium chloride  10 mEq Oral Daily  . rivaroxaban  20 mg Oral q1800  . vitamin C  500 mg Oral BID  . vitamin E  400 Units Oral Daily   Continuous Infusions: . diltiazem (CARDIZEM) infusion 15 mg/hr (08/07/17 0925)     LOS: 0 days    Time spent: >30 MINS    Reyne Dumas  [facility] Pager [phone]. If 7PM-7AM, please contact night-coverage at [email or web address], password [facility] 08/07/2017, 11:28 AM  LOS: 0 days

## 2017-08-07 NOTE — Consult Note (Signed)
Cardiology Consultation:   Patient ID: Isabella Hall; [id number]; 1956/06/17   Admit date: 08/06/2017 Date of Consult: 08/07/2017  Primary Care Provider: Doree Albee, MD Primary Cardiologist: Dr [name] Primary Electrophysiologist:  Dr [name]   Patient Profile:   Isabella Hall is a 61 y.o. female with a hx of permanent AF who is being seen today for the evaluation of AF with RVR at the request of Dr Tat.  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 In Feb 2016 she was noted to have an EF of 40% by when in AF. Cath done then revealed normal coronaries. She had a TEE cardioversion then. A f/u echo was scheduled for Aug 2016 but she never had it done.  An EKG in Dec 2017 shows she had gone back into AF but was asymptomatic so the plan was for anticoagulation and rate control. She has always had a fast rate at baseline and when she gets sick it goes faster. She has had bouts bronchitis in the past (never smoked). She was admitted 08/06/17 with acute bronchitis and her rate was noted to be fast-145. She has been admitted and placed on IV Diltiazem. She tells me she really doesn't notice her AF unless the rate gets > 130 and then it bothers her.   Past Medical History:  Diagnosis Date  . Atrial fibrillation (HCC)     CHADSVASC score 4  . Atrial flutter ([place])   . Essential hypertension   . Hypothyroidism   . Obesity   . Type 2 diabetes mellitus ([place])     Past Surgical History:  Procedure Laterality Date  . CARDIOVERSION N/A 01/25/2015   Procedure: CARDIOVERSION;  Surgeon: Satira Sark, MD;  Location: [facility];  Service: Cardiovascular;  Laterality: N/A;  . CHOLECYSTECTOMY  1979  . LEFT HEART CATHETERIZATION WITH CORONARY ANGIOGRAM N/A 01/27/2015   Procedure: LEFT HEART CATHETERIZATION WITH CORONARY ANGIOGRAM;  Surgeon: Sinclair Grooms, MD;  Location: [facility] CATH LAB;  Service: Cardiovascular;  Laterality:  N/A;  . TEE WITHOUT CARDIOVERSION N/A 01/25/2015   Procedure: TRANSESOPHAGEAL ECHOCARDIOGRAM (TEE);  Surgeon: Satira Sark, MD;  Location: [facility];  Service: Cardiovascular;  Laterality: N/A;  . [place]      Prior to Admission medications   Medication Sig Start Date End Date Taking? Authorizing Provider  carvedilol (COREG) 25 MG tablet Take 1.5 tablets (37.5 mg total) by mouth 2 (two) times daily. 11/29/16  Yes Herminio Commons, MD  CINNAMON PO Take 1 capsule by mouth daily.   Yes [provider]  diltiazem (CARDIZEM CD) 240 MG 24 hr capsule TAKE ONE CAPSULE BY MOUTH TWICE DAILY 06/18/17  Yes Herminio Commons, MD  furosemide (LASIX) 20 MG tablet TAKE ONE TABLET BY MOUTH ONCE DAILY 07/11/17  Yes Herminio Commons, MD  GARLIC PO Take 1 tablet by mouth daily.   Yes [provider]  levothyroxine (SYNTHROID, LEVOTHROID) 137 MCG tablet Take 1 tablet by mouth daily. 07/31/17  Yes [provider]  Multiple Vitamin (MULTIVITAMIN WITH MINERALS) TABS tablet Take 1 tablet by mouth daily.   Yes [provider]  potassium chloride (K-DUR) 10 MEQ tablet Take 1 tablet (10 mEq total) by mouth daily. 04/06/16  Yes Herminio Commons, MD  sodium chloride (OCEAN) 0.65 % nasal spray Place 1 spray into both nostrils 3 (three) times daily as needed. Uses daily, up to  three times per day during allergy season   Yes [provider]  vitamin C (ASCORBIC ACID) 500 MG tablet Take 500 mg by mouth 2 (two) times daily.   Yes [provider]  vitamin E 400 UNIT capsule Take 400 Units by mouth daily.   Yes [provider]  XARELTO 20 MG TABS tablet TAKE 1 TABLET BY MOUTH ONCE DAILY WITH SUPPER 07/02/17        Inpatient Medications: Scheduled Meds: . benzonatate  200 mg Oral TID  . carvedilol  12.5 mg Oral BID WC  . furosemide  20 mg Oral Daily  . insulin aspart  0-5 Units Subcutaneous QHS  . insulin aspart  0-9 Units  Subcutaneous TID WC  . levalbuterol  0.63 mg Nebulization Q6H  . levothyroxine  137 mcg Oral QAC breakfast  . multivitamin with minerals  1 tablet Oral Daily  . potassium chloride  10 mEq Oral Daily  . predniSONE  50 mg Oral Q breakfast  . rivaroxaban  20 mg Oral q1800  . vitamin C  500 mg Oral BID  . vitamin E  400 Units Oral Daily   Continuous Infusions: . diltiazem (CARDIZEM) infusion 15 mg/hr (08/07/17 0925)   PRN Meds: acetaminophen **OR** acetaminophen, levalbuterol, ondansetron **OR** ondansetron (ZOFRAN) IV, sodium chloride  Allergies:   No Known Allergies  Social History:   Social History   Social History  . Marital status: Divorced    Spouse name: N/A  . Number of children: N/A  . Years of education: N/A   Occupational History  . 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 Smoking status: Never Smoker  . Smokeless tobacco: Never Used  . Alcohol use No  . Drug use: No  . Sexual activity: Yes    Birth control/ protection: Surgical   Other Topics Concern  . Not on file   Social History Narrative  . No narrative on file    Family History:   Family History  Problem Relation Age of Onset  . Cancer Mother   . Heart disease Father   . Hypertension Unknown   . Coronary artery disease Sister        died of an MI in her 39's  . Cancer Maternal Grandmother   . Atrial fibrillation Neg Hx   . Diabetes Neg Hx      ROS:  Please see the history of present illness.  ROS  All other ROS reviewed and negative.     Physical Exam/Data:   Vitals:   08/07/17 0745 08/07/17 0800 08/07/17 0819 08/07/17 0900  BP: (!) 119/91 (!) 117/93  (!) 129/102  Pulse: (!) 105 90  (!) 121  Resp: 15 12  16   Temp:      TempSrc:      SpO2: 94% 93% 97% 98%  Weight:      Height:        Intake/Output Summary (Last 24 hours) at 08/07/17 0949 Last data filed at 08/07/17 0925  Gross per 24 hour  Intake            646.5 ml  Output                 0 ml  Net            646.5 ml   Filed Weights   08/06/17 1007 08/06/17 1900 08/07/17 0500  Weight: 276 lb (125.2 kg) 298 lb 4.5 oz (135.3 kg) Marland Kitchen)  300 lb 14.9 oz (136.5 kg)   Body mass index is 48.57 kg/m.  General:  Obese Caucasian female, NAD HEENT: normal Lymph: no adenopathy Neck: no JVD Endocrine:  No thryomegaly Vascular: No carotid bruits; FA pulses 2+ bilaterally without bruits  Cardiac:  Irregularly irregular with rapid rate Lungs:  Diffuse rhonchi and wheezing Abd: obese, soft, nontender, no hepatomegaly  Ext: no edema Musculoskeletal:  No deformities, BUE and BLE strength normal and equal Skin: warm and dry  Neuro:  CNs 2-12 intact, no focal abnormalities noted Psych:  Normal affect   EKG:  The EKG was personally reviewed and demonstrates:  AF with RVR Telemetry:  Telemetry was personally reviewed and demonstrates:  AF with RVR  Relevant CV Studies: Cath Feb 2016- Normal coronaries  TEE Feb 2016- Impressions:  - Rapid atrial flutter present during the study. LVEF estimated at 40-45% with diffuse hypokinesis in this setting. Consider repeat transthoracic echocardiogram later to reassess LVEF in sinus rhythm. No left or right atrial appendage thrombus with intermittent spontaneous contrast noted within the left atrium and appendage. No PFO or ASD by color Doppler. Mild scattered atherosclerosis noted within the descending aorta.   Laboratory Data:  Chemistry  Recent Labs Lab 08/06/17 1021 08/07/17 0600  NA 135 134*  K 4.1 3.8  CL 102 101  CO2 24 26  GLUCOSE 202* 315*  BUN 22* 29*  CREATININE 1.36* 1.48*  CALCIUM 8.7* 8.6*  GFRNONAA 42* 38*  GFRAA 48* 44*  ANIONGAP 9 7    No results for input(s): PROT, ALBUMIN, AST, ALT, ALKPHOS, BILITOT in the last 168 hours. Hematology  Recent Labs Lab 08/06/17 1021  WBC 9.4  RBC 4.50  HGB 12.2  HCT 37.6  MCV 83.6  MCH 27.1  MCHC 32.4  RDW 16.1*  PLT 163   Cardiac EnzymesNo results  for input(s): TROPONINI in the last 168 hours. No results for input(s): TROPIPOC in the last 168 hours.  BNPNo results for input(s): BNP, PROBNP in the last 168 hours.  DDimer No results for input(s): DDIMER in the last 168 hours.  Radiology/Studies:  Dg Chest 2 View  Result Date: 08/06/2017 CLINICAL DATA:  Cough. EXAM: CHEST  2 VIEW COMPARISON:  07/04/2016 . FINDINGS: Mediastinum hilar structures normal. Low lung volumes. Small pleural effusions versus pleural scarring. Pneumothorax. Heart size stable. No acute bony abnormality . IMPRESSION: Low lung volumes. Small pleural effusions versus pleural scarring noted on lateral view. Exam otherwise stable from prior exam. Electronically Signed   By: [place]   On: 08/06/2017 11:00    Assessment and Plan:   AF with RVR- Pt has permanent AF. Her rate is usually high but she tolerates this  Acute bronchitis- Per primary team. She has had this in the past  CRI-3 SCr was 1.6 in July 2017  Morbid obesity- her wgt in the office March 2018 was 298 lbs- now 300  History of NICM- Past EF in Feb 2016 was 40-45% felt to be related to AF  Normal coronaries- Cath feb 2016  Chronin anticoagulation- CHADs VASc=4. On Xarelto  HTN Elevated diastolic B/P  DM- Diet controlled- Hgb A1c- 6.1  Plan- MD to see. She does not appear to be in CHF but her wgt is up 20 lbs, will check a BNP. An echo done when her rate is in the 130's may not reflect her baseline EF, consider checking an echo when she is over her acute illness. Continue IV Diltiazem for now, avoid beta blocker.  Signed, Kerin Ransom, PA-C  08/07/2017 9:49 AM   The patient was seen and examined, and I agree with the history, physical exam, assessment and plan as documented above, with modifications as noted below. I have also personally reviewed all relevant documentation, old records, labs, and both radiographic and cardiovascular studies. I have also independently interpreted  old and new ECG's.  61 yr old woman well known to me from clinic admitted with upper respiratory tract infection (likely virally mediated bronchitis) and consequent rapid atrial fibrillation.  Says she is feeling much better today than yesterday. Had visited a hospitalized friend last week with pneumonia, and began to develop non-productive cough and myalgias 2-3 days later.  Currently being treated with steroids and Xopenex.  She does not appear to be in heart failure. Currently on diltiazem infusion to control HR. Anticoagulated with Xarelto.  HR's will become easier to control as infection subsides and as steroids are weaned, at which time she can be transitioned back to AV nodal blocking agents.  Continue oral diuretics for now.    Kate Sable, MD, [facility]  08/07/2017 10:40 AM

## 2017-08-08 DIAGNOSIS — J206 Acute bronchitis due to rhinovirus: Secondary | ICD-10-CM

## 2017-08-08 DIAGNOSIS — E6609 Other obesity due to excess calories: Secondary | ICD-10-CM

## 2017-08-08 LAB — COMPREHENSIVE METABOLIC PANEL
ALBUMIN: 3.5 g/dL (ref 3.5–5.0)
ALT: 25 U/L (ref 14–54)
AST: 21 U/L (ref 15–41)
Alkaline Phosphatase: 51 U/L (ref 38–126)
Anion gap: 10 (ref 5–15)
BILIRUBIN TOTAL: 0.9 mg/dL (ref 0.3–1.2)
BUN: 34 mg/dL — AB (ref 6–20)
CHLORIDE: 100 mmol/L — AB (ref 101–111)
CO2: 25 mmol/L (ref 22–32)
CREATININE: 1.42 mg/dL — AB (ref 0.44–1.00)
Calcium: 8.9 mg/dL (ref 8.9–10.3)
GFR calc Af Amer: 46 mL/min — ABNORMAL LOW (ref >60)
GFR calc non Af Amer: 40 mL/min — ABNORMAL LOW (ref >60)
GLUCOSE: 298 mg/dL — AB (ref 65–99)
Potassium: 4.1 mmol/L (ref 3.5–5.1)
Sodium: 135 mmol/L (ref 135–145)
Total Protein: 7.2 g/dL (ref 6.5–8.1)

## 2017-08-08 LAB — CBC
HCT: 36.7 % (ref 36.0–46.0)
Hemoglobin: 11.9 g/dL — ABNORMAL LOW (ref 12.0–15.0)
MCH: 26.7 pg (ref 26.0–34.0)
MCHC: 32.4 g/dL (ref 30.0–36.0)
MCV: 82.5 fL (ref 78.0–100.0)
PLATELETS: 167 10*3/uL (ref 150–400)
RBC: 4.45 MIL/uL (ref 3.87–5.11)
RDW: 16.2 % — AB (ref 11.5–15.5)
WBC: 14.4 10*3/uL — AB (ref 4.0–10.5)

## 2017-08-08 LAB — GLUCOSE, CAPILLARY
GLUCOSE-CAPILLARY: 335 mg/dL — AB (ref 65–99)
Glucose-Capillary: 295 mg/dL — ABNORMAL HIGH (ref 65–99)
Glucose-Capillary: 344 mg/dL — ABNORMAL HIGH (ref 65–99)
Glucose-Capillary: 311 mg/dL — ABNORMAL HIGH (ref 65–99)

## 2017-08-08 LAB — BRAIN NATRIURETIC PEPTIDE: B Natriuretic Peptide: 334 pg/mL — ABNORMAL HIGH (ref 0.0–100.0)

## 2017-08-08 LAB — HIV ANTIBODY (ROUTINE TESTING W REFLEX): HIV SCREEN 4TH GENERATION: NONREACTIVE

## 2017-08-08 MED ORDER — IPRATROPIUM BROMIDE 0.02 % IN SOLN: 0.5000 mg | Freq: Three times a day (TID) | RESPIRATORY_TRACT | Status: DC

## 2017-08-08 MED ORDER — METHYLPREDNISOLONE SODIUM SUCC 125 MG IJ SOLR
60.0000 mg | Freq: Every day | INTRAMUSCULAR | Status: DC
  Administered 2017-08-08 – 2017-08-10 (×3): 60 mg via INTRAVENOUS
  Filled 2017-08-08 (×3): qty 2

## 2017-08-08 MED ORDER — INSULIN ASPART 100 UNIT/ML ~~LOC~~ SOLN
3.0000 [IU] | Freq: Three times a day (TID) | SUBCUTANEOUS | Status: DC
  Administered 2017-08-08 – 2017-08-09 (×4): 3 [IU] via SUBCUTANEOUS

## 2017-08-08 MED ORDER — GUAIFENESIN ER 600 MG PO TB12
1200.0000 mg | ORAL_TABLET | Freq: Two times a day (BID) | ORAL | Status: DC
  Administered 2017-08-08 – 2017-08-11 (×7): 1200 mg via ORAL
  Filled 2017-08-08 (×7): qty 2

## 2017-08-08 MED ORDER — INSULIN ASPART 100 UNIT/ML ~~LOC~~ SOLN
0.0000 [IU] | Freq: Every day | SUBCUTANEOUS | Status: DC
  Administered 2017-08-08: 4 [IU] via SUBCUTANEOUS

## 2017-08-08 MED ORDER — INSULIN GLARGINE 100 UNIT/ML ~~LOC~~ SOLN
14.0000 [IU] | Freq: Every day | SUBCUTANEOUS | Status: DC
  Administered 2017-08-08: 14 [IU] via SUBCUTANEOUS
  Filled 2017-08-08 (×2): qty 0.14

## 2017-08-08 MED ORDER — LEVALBUTEROL HCL 0.63 MG/3ML IN NEBU
0.6300 mg | INHALATION_SOLUTION | Freq: Three times a day (TID) | RESPIRATORY_TRACT | Status: DC
  Administered 2017-08-08 – 2017-08-09 (×3): 0.63 mg via RESPIRATORY_TRACT
  Filled 2017-08-08 (×3): qty 3

## 2017-08-08 MED ORDER — INSULIN ASPART 100 UNIT/ML ~~LOC~~ SOLN
0.0000 [IU] | Freq: Three times a day (TID) | SUBCUTANEOUS | Status: DC
  Administered 2017-08-08 – 2017-08-09 (×3): 15 [IU] via SUBCUTANEOUS
  Administered 2017-08-09: 20 [IU] via SUBCUTANEOUS
  Administered 2017-08-09: 4 [IU] via SUBCUTANEOUS
  Administered 2017-08-10: 7 [IU] via SUBCUTANEOUS
  Administered 2017-08-10: 11 [IU] via SUBCUTANEOUS
  Administered 2017-08-10: 3 [IU] via SUBCUTANEOUS
  Administered 2017-08-11: 11 [IU] via SUBCUTANEOUS
  Administered 2017-08-11: 3 [IU] via SUBCUTANEOUS
  Administered 2017-08-11: 4 [IU] via SUBCUTANEOUS

## 2017-08-08 MED ORDER — IPRATROPIUM BROMIDE 0.02 % IN SOLN
0.5000 mg | Freq: Three times a day (TID) | RESPIRATORY_TRACT | Status: DC
  Administered 2017-08-08 – 2017-08-09 (×3): 0.5 mg via RESPIRATORY_TRACT
  Filled 2017-08-08 (×3): qty 2.5

## 2017-08-08 NOTE — Progress Notes (Signed)
PROGRESS NOTE    Isabella Hall  SPQ:[id number] DOB: Jan 21, 1956 DOA: 08/06/2017 PCP: Doree Albee, MD    Brief Narrative:  61 year old female with a history of chronic atrial fibrillation, hypertension, diabetes, chronic kidney disease stage III, presented with worsening shortness of breath and rapid atrial fibrillation. INR viral bronchitis. Treated with intravenous Cardizem for atrial fibrillation and supportive management for bronchitis.   Assessment & Plan:   Active Problems:   Benign essential HTN   Obesity-BMI 45   CKD (chronic kidney disease), stage III   Nonischemic cardiomyopathy (HCC)   Atrial fibrillation with RVR (HCC)   Acute bronchitis   1. Atrial fibrillation with ventricular response. Precipitated by underlying respiratory illness. Patient is currently on Cardizem drip and heart rate remains uncontrolled. She is also on Coreg. Hopefully, heart rate will improve his respiratory illness improves. Continue current treatments. She is anticoagulated with xarelto 2. Acute viral bronchitis. Respiratory panel positive for enterovirus. Discontinue further antibiotics. Continue supportive management with bronchodilators. We'll start to wean steroids. Continue pulmonary hygiene. 3. Chronic kidney disease stage III. Creatinine at 1.4 and is currently at baseline. Continue to monitor. 4. Morbid obesity. Counseled on importance of diet and exercise 5. Nonischemic cardiomyopathy. Previous ejection fraction in 2/26 was 40-45%. Echocardiogram repeated during this admission showed EF 50%. Continue medical management. 6. Diabetes. Diet controlled. A1c is 6.1. Continue on sliding scale insulin. Blood sugars currently elevated due to steroids. 7. Hypothyroidism. TSH is in normal range. Continue on Synthroid   DVT prophylaxis: xarelto Code Status: full code Family Communication: no family present Disposition Plan: discharge home once improved   Consultants:    cardiology  Procedures:  Echo: - Left ventricle: The cavity size was normal. Wall thickness was   increased in a pattern of mild LVH. Systolic function was at the   lower limits of normal. The estimated ejection fraction was 50%.   The study was not technically sufficient to allow evaluation of   LV diastolic dysfunction due to atrial fibrillation. - Mitral valve: There was mild regurgitation. - Left atrium: The atrium was severely dilated. - Systemic veins: IVC is dilated with normal respiratory variation.    Estimated CVP 8 mmHg.  Antimicrobials:   Azithromycin 8/20>>8/22   Subjective: Overall breathing is improving. Still has wheezing and productive cough  Objective: Vitals:   08/08/17 0615 08/08/17 0630 08/08/17 0700 08/08/17 0730  BP:  (!) 123/106 109/86 135/88  Pulse: (!) 133 (!) 152 (!) 122 (!) 108  Resp: 19 (!) 28 16 16   Temp:      TempSrc:      SpO2: 92% 94% 92% 92%  Weight:      Height:        Intake/Output Summary (Last 24 hours) at 08/08/17 0848 Last data filed at 08/08/17 0200  Gross per 24 hour  Intake           623.75 ml  Output                0 ml  Net           623.75 ml   Filed Weights   08/06/17 1900 08/07/17 0500 08/08/17 0500  Weight: 135.3 kg (298 lb 4.5 oz) (!) 136.5 kg (300 lb 14.9 oz) (!) 136.3 kg (300 lb 7.8 oz)    Examination:  General exam: Appears calm and comfortable  Respiratory system: mild wheeze bilatearlly. Respiratory effort normal. Cardiovascular system: S1 & S2 heard, irregular. No JVD, murmurs, rubs, gallops or clicks.  No pedal edema. Gastrointestinal system: Abdomen is nondistended, soft and nontender. No organomegaly or masses felt. Normal bowel sounds heard. Central nervous system: Alert and oriented. No focal neurological deficits. Extremities: Symmetric 5 x 5 power. Skin: No rashes, lesions or ulcers Psychiatry: Judgement and insight appear normal. Mood & affect appropriate.     Data Reviewed: I have  personally reviewed following labs and imaging studies  CBC:  Recent Labs Lab 08/06/17 1021 08/08/17 0540  WBC 9.4 14.4*  NEUTROABS 7.4  --   HGB 12.2 11.9*  HCT 37.6 36.7  MCV 83.6 82.5  PLT 163 196   Basic Metabolic Panel:  Recent Labs Lab 08/06/17 1021 08/07/17 0600 08/08/17 0540  NA 135 134* 135  K 4.1 3.8 4.1  CL 102 101 100*  CO2 24 26 25   GLUCOSE 202* 315* 298*  BUN 22* 29* 34*  CREATININE 1.36* 1.48* 1.42*  CALCIUM 8.7* 8.6* 8.9  MG  --  2.0  --    GFR: Estimated Creatinine Clearance: 60.7 mL/min (A) (by C-G formula based on SCr of 1.42 mg/dL (H)). Liver Function Tests:  Recent Labs Lab 08/08/17 0540  AST 21  ALT 25  ALKPHOS 51  BILITOT 0.9  PROT 7.2  ALBUMIN 3.5   No results for input(s): LIPASE, AMYLASE in the last 168 hours. No results for input(s): AMMONIA in the last 168 hours. Coagulation Profile: No results for input(s): INR, PROTIME in the last 168 hours. Cardiac Enzymes: No results for input(s): CKTOTAL, CKMB, CKMBINDEX, TROPONINI in the last 168 hours. BNP (last 3 results) No results for input(s): PROBNP in the last 8760 hours. HbA1C:  Recent Labs  08/06/17 1021  HGBA1C 6.9*   CBG:  Recent Labs Lab 08/07/17 0826 08/07/17 1202 08/07/17 1740 08/07/17 2136 08/08/17 0739  GLUCAP 297* 308* 342* 358* 295*   Lipid Profile: No results for input(s): CHOL, HDL, LDLCALC, TRIG, CHOLHDL, LDLDIRECT in the last 72 hours. Thyroid Function Tests:  Recent Labs  08/07/17 0600  TSH 2.021   Anemia Panel: No results for input(s): VITAMINB12, FOLATE, FERRITIN, TIBC, IRON, RETICCTPCT in the last 72 hours. Sepsis Labs: No results for input(s): PROCALCITON, LATICACIDVEN in the last 168 hours.  Recent Results (from the past 240 hour(s))  MRSA PCR Screening     Status: None   Collection Time: 08/06/17  7:00 PM  Result Value Ref Range Status   MRSA by PCR NEGATIVE NEGATIVE Final    Comment:        The GeneXpert MRSA Assay (FDA approved  for NASAL specimens only), is one component of a comprehensive MRSA colonization surveillance program. It is not intended to diagnose MRSA infection nor to guide or monitor treatment for MRSA infections.   Respiratory Panel by PCR     Status: Abnormal   Collection Time: 08/06/17  7:00 PM  Result Value Ref Range Status   Adenovirus NOT DETECTED NOT DETECTED Final   Coronavirus 229E NOT DETECTED NOT DETECTED Final   Coronavirus HKU1 NOT DETECTED NOT DETECTED Final   Coronavirus NL63 NOT DETECTED NOT DETECTED Final   Coronavirus OC43 NOT DETECTED NOT DETECTED Final   Metapneumovirus NOT DETECTED NOT DETECTED Final   Rhinovirus / Enterovirus DETECTED (A) NOT DETECTED Final   Influenza A NOT DETECTED NOT DETECTED Final   Influenza A H1 NOT DETECTED NOT DETECTED Final   Influenza A H1 2009 NOT DETECTED NOT DETECTED Final   Influenza A H3 NOT DETECTED NOT DETECTED Final   Influenza B NOT DETECTED NOT  DETECTED Final   Parainfluenza Virus 1 NOT DETECTED NOT DETECTED Final   Parainfluenza Virus 2 NOT DETECTED NOT DETECTED Final   Parainfluenza Virus 3 NOT DETECTED NOT DETECTED Final   Parainfluenza Virus 4 NOT DETECTED NOT DETECTED Final   Respiratory Syncytial Virus NOT DETECTED NOT DETECTED Final   Bordetella pertussis NOT DETECTED NOT DETECTED Final   Chlamydophila pneumoniae NOT DETECTED NOT DETECTED Final   Mycoplasma pneumoniae NOT DETECTED NOT DETECTED Final    Comment: Performed at [facility], [place] [address], [place], [place] [address]         Radiology Studies: Dg Chest 2 View  Result Date: 08/06/2017 CLINICAL DATA:  Cough. EXAM: CHEST  2 VIEW COMPARISON:  07/04/2016 . FINDINGS: Mediastinum hilar structures normal. Low lung volumes. Small pleural effusions versus pleural scarring. Pneumothorax. Heart size stable. No acute bony abnormality . IMPRESSION: Low lung volumes. Small pleural effusions versus pleural scarring noted on lateral view. Exam otherwise stable from  prior exam. Electronically Signed   By: Marcello Moores  Register   On: 08/06/2017 11:00   Dg Chest Port 1 View  Result Date: 08/07/2017 CLINICAL DATA:  Shortness of Breath EXAM: PORTABLE CHEST 1 VIEW COMPARISON:  08/06/2017 FINDINGS: Cardiac shadow remains enlarged. Lungs are well aerated bilaterally. No focal infiltrate or sizable effusion is seen. No acute bony abnormality is noted. IMPRESSION: No active disease. Electronically Signed   By: Inez Catalina M.D.   On: 08/07/2017 11:18        Scheduled Meds: . azithromycin  500 mg Oral Daily  . benzonatate  200 mg Oral TID  . carvedilol  12.5 mg Oral BID WC  . furosemide  20 mg Oral Daily  . insulin aspart  0-5 Units Subcutaneous QHS  . insulin aspart  0-9 Units Subcutaneous TID WC  . levalbuterol  0.63 mg Nebulization TID  . levothyroxine  137 mcg Oral QAC breakfast  . methylPREDNISolone (SOLU-MEDROL) injection  60 mg Intravenous Q12H  . multivitamin with minerals  1 tablet Oral Daily  . potassium chloride  10 mEq Oral Daily  . rivaroxaban  20 mg Oral q1800  . vitamin C  500 mg Oral BID  . vitamin E  400 Units Oral Daily   Continuous Infusions: . diltiazem (CARDIZEM) infusion 15 mg/hr (08/07/17 2023)     LOS: 1 day    Time spent: 15mins    Yehudis Monceaux, MD [facility] Pager [phone]  If 7PM-7AM, please contact night-coverage [email or web address] Password [facility] 08/08/2017, 8:48 AM

## 2017-08-08 NOTE — Progress Notes (Signed)
Progress Note  Patient Name: Isabella Hall Date of Encounter: 08/08/2017  Primary Cardiologist: Dr. [name]  Subjective   Says she's breathing better. Can't feel rapid afib  Inpatient Medications    Scheduled Meds: . benzonatate  200 mg Oral TID  . carvedilol  12.5 mg Oral BID WC  . furosemide  20 mg Oral Daily  . guaiFENesin  1,200 mg Oral BID  . insulin aspart  0-5 Units Subcutaneous QHS  . insulin aspart  0-9 Units Subcutaneous TID WC  . ipratropium  0.5 mg Nebulization TID  . levalbuterol  0.63 mg Nebulization TID  . levothyroxine  137 mcg Oral QAC breakfast  . methylPREDNISolone (SOLU-MEDROL) injection  60 mg Intravenous Daily  . multivitamin with minerals  1 tablet Oral Daily  . potassium chloride  10 mEq Oral Daily  . rivaroxaban  20 mg Oral q1800  . vitamin C  500 mg Oral BID  . vitamin E  400 Units Oral Daily   Continuous Infusions: . diltiazem (CARDIZEM) infusion 15 mg/hr (08/08/17 0900)   PRN Meds: acetaminophen **OR** acetaminophen, levalbuterol, ondansetron **OR** ondansetron (ZOFRAN) IV, sodium chloride   Vital Signs    Vitals:   08/08/17 0845 08/08/17 0900 08/08/17 0915 08/08/17 0930  BP: (!) 138/122 128/88 92/75 111/90  Pulse: (!) 141 (!) 144 (!) 138 (!) 140  Resp: (!) 25 20 16  (!) 21  Temp:      TempSrc:      SpO2: 94% 95% 96% 93%  Weight:      Height:        Intake/Output Summary (Last 24 hours) at 08/08/17 0939 Last data filed at 08/08/17 0900  Gross per 24 hour  Intake           308.75 ml  Output                0 ml  Net           308.75 ml   Filed Weights   08/06/17 1900 08/07/17 0500 08/08/17 0500  Weight: 298 lb 4.5 oz (135.3 kg) (!) 300 lb 14.9 oz (136.5 kg) (!) 300 lb 7.8 oz (136.3 kg)    Telemetry    Afib 117-150/m- Personally Reviewed  ECG      Physical Exam    GEN: No acute distress.   Neck: No JVD Cardiac: irreg at 120/m no murmurs, rubs, or gallops.  Respiratory: diffuse inspir/exp wheezing. GI: Soft,  nontender, non-distended  MS: No edema; No deformity. Neuro:  Nonfocal  Psych: Normal affect   Labs    Chemistry Recent Labs Lab 08/06/17 1021 08/07/17 0600 08/08/17 0540  NA 135 134* 135  K 4.1 3.8 4.1  CL 102 101 100*  CO2 24 26 25   GLUCOSE 202* 315* 298*  BUN 22* 29* 34*  CREATININE 1.36* 1.48* 1.42*  CALCIUM 8.7* 8.6* 8.9  PROT  --   --  7.2  ALBUMIN  --   --  3.5  AST  --   --  21  ALT  --   --  25  ALKPHOS  --   --  51  BILITOT  --   --  0.9  GFRNONAA 42* 38* 40*  GFRAA 48* 44* 46*  ANIONGAP 9 7 10      Hematology Recent Labs Lab 08/06/17 1021 08/08/17 0540  WBC 9.4 14.4*  RBC 4.50 4.45  HGB 12.2 11.9*  HCT 37.6 36.7  MCV 83.6 82.5  MCH 27.1 26.7  MCHC 32.4  32.4  RDW 16.1* 16.2*  PLT 163 167    Cardiac EnzymesNo results for input(s): TROPONINI in the last 168 hours. No results for input(s): TROPIPOC in the last 168 hours.   BNP Recent Labs Lab 08/08/17 0540  BNP 334.0*     DDimer No results for input(s): DDIMER in the last 168 hours.   Radiology    Dg Chest 2 View  Result Date: 08/06/2017 CLINICAL DATA:  Cough. EXAM: CHEST  2 VIEW COMPARISON:  07/04/2016 . FINDINGS: Mediastinum hilar structures normal. Low lung volumes. Small pleural effusions versus pleural scarring. Pneumothorax. Heart size stable. No acute bony abnormality . IMPRESSION: Low lung volumes. Small pleural effusions versus pleural scarring noted on lateral view. Exam otherwise stable from prior exam. Electronically Signed   By: Marcello Moores  Register   On: 08/06/2017 11:00   Dg Chest Port 1 View  Result Date: 08/07/2017 CLINICAL DATA:  Shortness of Breath EXAM: PORTABLE CHEST 1 VIEW COMPARISON:  08/06/2017 FINDINGS: Cardiac shadow remains enlarged. Lungs are well aerated bilaterally. No focal infiltrate or sizable effusion is seen. No acute bony abnormality is noted. IMPRESSION: No active disease. Electronically Signed   By: Inez Catalina M.D.   On: 08/07/2017 11:18    Cardiac Studies    Cath Feb 2016- Normal coronaries   TEE Feb 2016- Impressions:  - Rapid atrial flutter present during the study. LVEF estimated at   40-45% with diffuse hypokinesis in this setting. Consider repeat   transthoracic echocardiogram later to reassess LVEF in sinus   rhythm. No left or right atrial appendage thrombus with   intermittent spontaneous contrast noted within the left atrium   and appendage. No PFO or ASD by color Doppler. Mild scattered   atherosclerosis noted within the descending aorta.    ECHO 08/07/17 Study Conclusions   - Left ventricle: The cavity size was normal. Wall thickness was   increased in a pattern of mild LVH. Systolic function was at the   lower limits of normal. The estimated ejection fraction was 50%.   The study was not technically sufficient to allow evaluation of   LV diastolic dysfunction due to atrial fibrillation. - Mitral valve: There was mild regurgitation. - Left atrium: The atrium was severely dilated. - Systemic veins: IVC is dilated with normal respiratory variation.   Estimated CVP 8 mmHg.    Patient Profile     61 y.o. female with permanent AF admitted with bronchitis and now with rapid AF on IV diltiazem  Assessment & Plan     AF with RVR- Pt has permanent AF. Her rate is usually high but she tolerates this Still 117-150/m on IV diltiazem 15 mg/hr. Also on Coreg 12.5 mg BID, can't increase with amount of wheezing.   Acute bronchitis- Per primary team. She has had this in the past. On Xopenex, atrovent mucinex   CRI-3 SCr was 1.6 in July 2017 1.42 today   Morbid obesity- her wgt in the office March 2018 was 298 lbs- now 300   History of NICM- Past EF in Feb 2016 was 40-45% felt to be related to AF.EF yesterday 50%.  BNP 335 on admission. I/O's positve since admission   Normal coronaries- Cath feb 2016   Chronin anticoagulation- CHADs VASc=4. On Xarelto   HTN Elevated diastolic B/P yest now low   DM- Diet controlled-  Hgb A1c- 6.1        Signed, Ermalinda Barrios, PA-C  08/08/2017, 9:39 AM    The patient was seen and  examined, and I agree with the history, physical exam, assessment and plan as documented above, with modifications as noted below.  She is doing better clinically today. Rhinovirus detected on viral panel. Steroids being weaned by hospitalist. Remains tachycardic on IV diltiazem infusion. LVEF 50% by echo yesterday.  She does not appear to be in heart failure. Anticoagulated with Xarelto.  HR's will become easier to control as infection subsides and as steroids are weaned, at which time she can be transitioned back to AV nodal blocking agents.   Kate Sable, MD, [facility]  08/08/2017 10:13 AM

## 2017-08-08 NOTE — Progress Notes (Signed)
Inpatient Diabetes Program Recommendations  AACE/ADA: New Consensus Statement on Inpatient Glycemic Control (2015)  Target Ranges:  Prepandial:   less than 140 mg/dL      Peak postprandial:   less than 180 mg/dL (1-2 hours)      Critically ill patients:  140 - 180 mg/dL  Results for Isabella Hall, Isabella Hall (MRN [id number]) as of 08/08/2017 11:16  Ref. Range 08/07/2017 08:26 08/07/2017 12:02 08/07/2017 17:40 08/07/2017 21:36 08/08/2017 07:39  Glucose-Capillary Latest Ref Range: 65 - 99 mg/dL 297 (H) 308 (H) 342 (H) 358 (H) 295 (H)   Results for MINSA, Isabella Hall (MRN [id number]) as of 08/08/2017 11:16  Ref. Range 08/06/2017 10:21  Hemoglobin A1C Latest Ref Range: 4.8 - 5.6 % 6.9 (H)   Review of Glycemic Control  Diabetes history: DM2 Outpatient Diabetes medications: None (diet controlled) Current orders for Inpatient glycemic control: Novolog 0-9 units TID with meals, Novolog 0-5 units QHS  Inpatient Diabetes Program Recommendations:  Insulin - Basal: Please consider ordering one time dose of Lantus 14 units x1 now (based on 136 kg x 0.1 units). Then while on steroids, re-evaluate if basal insulin is needed on a daily basis. Insulin-Meal Coverage: While inpatient and ordered steroids, please consider ordering Novolog 3 units TID with meals for meal coverage if patient eats at least 50% of meals. HgbA1C: A1C 6.9% on 08/06/17 indicating an average glucose of 151 mg/dl over the past 2-3 months.  Thanks, Barnie Alderman, RN, MSN, CDE Diabetes Coordinator Inpatient Diabetes Program [phone] (Team Pager from 8am to 5pm)

## 2017-08-08 NOTE — Plan of Care (Signed)
Problem: Tissue Perfusion: Goal: Risk factors for ineffective tissue perfusion will decrease Outcome: Not Progressing Pt's hr remains on a fib w/ hr 100's. Elevated glucose.

## 2017-08-08 NOTE — Plan of Care (Signed)
Problem: Fluid Volume: Goal: Ability to maintain a balanced intake and output will improve Outcome: Progressing Positive intake over output

## 2017-08-09 DIAGNOSIS — I5032 Chronic diastolic (congestive) heart failure: Secondary | ICD-10-CM

## 2017-08-09 LAB — GLUCOSE, CAPILLARY
GLUCOSE-CAPILLARY: 133 mg/dL — AB (ref 65–99)
Glucose-Capillary: 315 mg/dL — ABNORMAL HIGH (ref 65–99)
Glucose-Capillary: 474 mg/dL — ABNORMAL HIGH (ref 65–99)
Glucose-Capillary: 185 mg/dL — ABNORMAL HIGH (ref 65–99)

## 2017-08-09 LAB — GLUCOSE, RANDOM: Glucose, Bld: 305 mg/dL — ABNORMAL HIGH (ref 65–99)

## 2017-08-09 MED ORDER — IPRATROPIUM BROMIDE 0.02 % IN SOLN
0.5000 mg | Freq: Two times a day (BID) | RESPIRATORY_TRACT | Status: DC
  Administered 2017-08-09: 0.5 mg via RESPIRATORY_TRACT
  Filled 2017-08-09: qty 2.5

## 2017-08-09 MED ORDER — INSULIN ASPART 100 UNIT/ML ~~LOC~~ SOLN: 6.0000 [IU] | Freq: Three times a day (TID) | SUBCUTANEOUS | Status: DC

## 2017-08-09 MED ORDER — INSULIN GLARGINE 100 UNIT/ML ~~LOC~~ SOLN
20.0000 [IU] | Freq: Every day | SUBCUTANEOUS | Status: DC
  Administered 2017-08-09 – 2017-08-11 (×3): 20 [IU] via SUBCUTANEOUS
  Filled 2017-08-09 (×5): qty 0.2

## 2017-08-09 MED ORDER — INSULIN ASPART 100 UNIT/ML ~~LOC~~ SOLN
8.0000 [IU] | Freq: Three times a day (TID) | SUBCUTANEOUS | Status: DC
  Administered 2017-08-09 – 2017-08-11 (×7): 8 [IU] via SUBCUTANEOUS

## 2017-08-09 MED ORDER — DILTIAZEM HCL 100 MG IV SOLR
5.0000 mg/h | INTRAVENOUS | Status: DC
  Administered 2017-08-09 – 2017-08-10 (×3): 15 mg/h via INTRAVENOUS
  Filled 2017-08-09 (×2): qty 100

## 2017-08-09 MED ORDER — LEVALBUTEROL HCL 0.63 MG/3ML IN NEBU
0.6300 mg | INHALATION_SOLUTION | Freq: Two times a day (BID) | RESPIRATORY_TRACT | Status: DC
  Administered 2017-08-09: 0.63 mg via RESPIRATORY_TRACT
  Filled 2017-08-09: qty 3

## 2017-08-09 MED ORDER — DIGOXIN 0.25 MG/ML IJ SOLN
0.1250 mg | Freq: Every day | INTRAMUSCULAR | Status: DC
  Administered 2017-08-09 – 2017-08-10 (×2): 0.125 mg via INTRAVENOUS
  Filled 2017-08-09 (×2): qty 2

## 2017-08-09 NOTE — Progress Notes (Signed)
PT'S CBG IS FINALLY UNDER 200.

## 2017-08-09 NOTE — Plan of Care (Signed)
Problem: Physical Regulation: Goal: Ability to maintain clinical measurements within normal limits will improve Outcome: Not Progressing Pt still on cardizem gtt at 15 HR still tachy.Jumps up between 130's when getting up to restroom

## 2017-08-09 NOTE — Progress Notes (Signed)
DR5 MEMON NOTIFIED OF ELEVATED CBG. STAT SERUM GLUCOSE ORDERED.

## 2017-08-09 NOTE — Plan of Care (Signed)
Problem: Tissue Perfusion: Goal: Risk factors for ineffective tissue perfusion will decrease Outcome: Not Progressing PT CONTINUES TO BE IN RAPID ATRIAL FIB. REMAINS ON CARDIZEN DRIP AT MAX DOSE 15MG /HR. DAILY IV DIGOXIN STARTED TODAY.  Problem: Metabolic: Goal: Ability to maintain appropriate glucose levels will improve Outcome: Not Progressing CBG'S  REMAIN ABOVE 300 DESPITE CHANGES IN INSULIN.

## 2017-08-09 NOTE — Progress Notes (Signed)
DR MEMON NOTIFIED OF ELEVATED CBG'S. HE WILL ORDER ADDITIONAL COVERAGE.

## 2017-08-09 NOTE — Progress Notes (Signed)
Progress Note  Patient Name: Isabella Hall Date of Encounter: 08/09/2017  Primary Cardiologist: [name]  Subjective   Breathing has improved but she feels weak and attributes it to rapid a fib.  Inpatient Medications    Scheduled Meds: . benzonatate  200 mg Oral TID  . carvedilol  12.5 mg Oral BID WC  . digoxin  0.125 mg Intravenous Daily  . furosemide  20 mg Oral Daily  . guaiFENesin  1,200 mg Oral BID  . insulin aspart  0-20 Units Subcutaneous TID WC  . insulin aspart  0-5 Units Subcutaneous QHS  . insulin aspart  3 Units Subcutaneous TID WC  . insulin glargine  20 Units Subcutaneous Daily  . ipratropium  0.5 mg Nebulization BID  . levalbuterol  0.63 mg Nebulization BID  . levothyroxine  137 mcg Oral QAC breakfast  . methylPREDNISolone (SOLU-MEDROL) injection  60 mg Intravenous Daily  . multivitamin with minerals  1 tablet Oral Daily  . potassium chloride  10 mEq Oral Daily  . rivaroxaban  20 mg Oral q1800  . vitamin C  500 mg Oral BID  . vitamin E  400 Units Oral Daily   Continuous Infusions: . diltiazem (CARDIZEM) infusion 15 mg/hr (08/09/17 0800)   PRN Meds: acetaminophen **OR** acetaminophen, levalbuterol, ondansetron **OR** ondansetron (ZOFRAN) IV, sodium chloride   Vital Signs    Vitals:   08/09/17 0800 08/09/17 0815 08/09/17 0830 08/09/17 0845  BP: (!) 112/92 (!) 137/124 107/88 100/76  Pulse: (!) 134 (!) 128 (!) 168 (!) 107  Resp: (!) 22 18 (!) 22 18  Temp:      TempSrc:      SpO2: 90% (!) 87% 90% 95%  Weight:      Height:        Intake/Output Summary (Last 24 hours) at 08/09/17 0855 Last data filed at 08/09/17 0846  Gross per 24 hour  Intake             2851 ml  Output                0 ml  Net             2851 ml   Filed Weights   08/07/17 0500 08/08/17 0500 08/09/17 0745  Weight: (!) 300 lb 14.9 oz (136.5 kg) (!) 300 lb 7.8 oz (136.3 kg) (!) 306 lb (138.8 kg)    Telemetry    Rapid atrial fibrillation - Personally Reviewed  ECG      Physical Exam   GEN: No acute distress.   Neck: No JVD Cardiac: Tachycardic, irregular, no murmurs, rubs, or gallops.  Respiratory: Clear to auscultation bilaterally. GI: Soft, nontender, non-distended  MS: No edema; No deformity. Neuro:  Nonfocal  Psych: Normal affect   Labs    Chemistry Recent Labs Lab 08/06/17 1021 08/07/17 0600 08/08/17 0540  NA 135 134* 135  K 4.1 3.8 4.1  CL 102 101 100*  CO2 24 26 25   GLUCOSE 202* 315* 298*  BUN 22* 29* 34*  CREATININE 1.36* 1.48* 1.42*  CALCIUM 8.7* 8.6* 8.9  PROT  --   --  7.2  ALBUMIN  --   --  3.5  AST  --   --  21  ALT  --   --  25  ALKPHOS  --   --  51  BILITOT  --   --  0.9  GFRNONAA 42* 38* 40*  GFRAA 48* 44* 46*  ANIONGAP 9 7 10  Hematology Recent Labs Lab 08/06/17 1021 08/08/17 0540  WBC 9.4 14.4*  RBC 4.50 4.45  HGB 12.2 11.9*  HCT 37.6 36.7  MCV 83.6 82.5  MCH 27.1 26.7  MCHC 32.4 32.4  RDW 16.1* 16.2*  PLT 163 167    Cardiac EnzymesNo results for input(s): TROPONINI in the last 168 hours. No results for input(s): TROPIPOC in the last 168 hours.   BNP Recent Labs Lab 08/08/17 0540  BNP 334.0*     DDimer No results for input(s): DDIMER in the last 168 hours.   Radiology    Dg Chest Port 1 View  Result Date: 08/07/2017 CLINICAL DATA:  Shortness of Breath EXAM: PORTABLE CHEST 1 VIEW COMPARISON:  08/06/2017 FINDINGS: Cardiac shadow remains enlarged. Lungs are well aerated bilaterally. No focal infiltrate or sizable effusion is seen. No acute bony abnormality is noted. IMPRESSION: No active disease. Electronically Signed   By: Inez Catalina M.D.   On: 08/07/2017 11:18    Cardiac Studies   ECHO 08/07/17 Study Conclusions  - Left ventricle: The cavity size was normal. Wall thickness was increased in a pattern of mild LVH. Systolic function was at the lower limits of normal. The estimated ejection fraction was 50%. The study was not technically sufficient to allow evaluation  of LV diastolic dysfunction due to atrial fibrillation. - Mitral valve: There was mild regurgitation. - Left atrium: The atrium was severely dilated. - Systemic veins: IVC is dilated with normal respiratory variation. Estimated CVP 8 mmHg.  Patient Profile     61 y.o. female with permanent AF admitted with acute viral bronchitis and with rapid AF on IV diltiazem.  Assessment & Plan    1. Rapid atrial fibrillation: Remains on IV diltiazem and Coreg. BP's low normal. I will start IV digoxin daily. She has chronically elevated HR's and has been evaluated by EP, but they are not eager to consider AV nodal ablation and pacing. That being said, her cardiomyopathy is tachycardia-mediated so may need to reconsider this issue. Anticoagulated with Xarelto. Unlikely to maintain sinus rhythm given permanence of disease and severe left atrial dilatation. Currently on IV methylprednisolone 60 mg daily. This is contributing to elevated HR's to some degree.  2. Acute viral bronchitis: On oral steroids. Improving. Currently on IV methylprednisolone 60 mg daily.  3. Chronic diastolic heart failure: Euvolemic. Continue Lasix 20 mg daily.  4. HTN: BP low normal.   Signed, Kate Sable, MD  08/09/2017, 8:55 AM

## 2017-08-09 NOTE — Progress Notes (Signed)
PROGRESS NOTE    Isabella Hall  VPX:[id number] DOB: 03/01/1956 DOA: 08/06/2017 PCP: Doree Albee, MD    Brief Narrative:  61 year old female with a history of chronic atrial fibrillation, hypertension, diabetes, chronic kidney disease stage III, presented with worsening shortness of breath and rapid atrial fibrillation. INR viral bronchitis. Treated with intravenous Cardizem for atrial fibrillation and supportive management for bronchitis.   Assessment & Plan:   Active Problems:   Benign essential HTN   Obesity-BMI 45   CKD (chronic kidney disease), stage III   Nonischemic cardiomyopathy (HCC)   Atrial fibrillation with RVR (HCC)   Acute bronchitis   1. Atrial fibrillation with ventricular response. Precipitated by underlying respiratory illness. Patient is currently on Cardizem drip and heart rate remains uncontrolled. She is also on Coreg. Blood pressures running in the low 100s range. May need to consider digoxin vs. Amiodarone. Will defer to cardiology. She is anticoagulated with xarelto 2. Acute viral bronchitis. Respiratory panel positive for enterovirus. Antibiotics discontinued. Continue supportive management with bronchodilators. Since she is still wheezing, will continue steroids. Continue pulmonary hygiene. 3. Chronic kidney disease stage III. Creatinine at 1.4 and is currently at baseline. Continue to monitor. 4. Morbid obesity. Counseled on importance of diet and exercise 5. Nonischemic cardiomyopathy. Previous ejection fraction in 2/26 was 40-45%. Echocardiogram repeated during this admission showed EF 50%. Continue medical management. 6. Diabetes. Diet controlled. A1c is 6.1. Continue on sliding scale insulin. Blood sugars currently elevated due to steroids. Started on lantus and meal coverage novolog yesterday. Will increase lantus to 20 units. 7. Hypothyroidism. TSH is in normal range. Continue on Synthroid   DVT prophylaxis: xarelto Code Status: full  code Family Communication: no family present Disposition Plan: discharge home once improved   Consultants:   cardiology  Procedures:  Echo: - Left ventricle: The cavity size was normal. Wall thickness was   increased in a pattern of mild LVH. Systolic function was at the   lower limits of normal. The estimated ejection fraction was 50%.   The study was not technically sufficient to allow evaluation of   LV diastolic dysfunction due to atrial fibrillation. - Mitral valve: There was mild regurgitation. - Left atrium: The atrium was severely dilated. - Systemic veins: IVC is dilated with normal respiratory variation.    Estimated CVP 8 mmHg.  Antimicrobials:   Azithromycin 8/20>>8/22   Subjective: Has productive cough. Still wheezing. Breathing mildly better today  Objective: Vitals:   08/09/17 0739 08/09/17 0745 08/09/17 0800 08/09/17 0815  BP:  117/88 (!) 112/92 (!) 137/124  Pulse:  (!) 146 (!) 134 (!) 128  Resp:  (!) 24 (!) 22 18  Temp: 97.6 F (36.4 C)     TempSrc:      SpO2:  (!) 89% 90% (!) 87%  Weight:  (!) 138.8 kg (306 lb)    Height:        Intake/Output Summary (Last 24 hours) at 08/09/17 0836 Last data filed at 08/09/17 0800  Gross per 24 hour  Intake             2491 ml  Output                0 ml  Net             2491 ml   Filed Weights   08/07/17 0500 08/08/17 0500 08/09/17 0745  Weight: (!) 136.5 kg (300 lb 14.9 oz) (!) 136.3 kg (300 lb 7.8 oz) (!) 138.8 kg (306  lb)    Examination:  General exam: Appears calm and comfortable  Respiratory system: bilateral wheezes. Respiratory effort normal. Cardiovascular system: S1 & S2 heard, irregular. No JVD, murmurs, rubs, gallops or clicks. No pedal edema. Gastrointestinal system: Abdomen is nondistended, soft and nontender. No organomegaly or masses felt. Normal bowel sounds heard. Central nervous system: Alert and oriented. No focal neurological deficits. Extremities: Symmetric 5 x 5 power. Skin: No  rashes, lesions or ulcers Psychiatry: Judgement and insight appear normal. Mood & affect appropriate.     Data Reviewed: I have personally reviewed following labs and imaging studies  CBC:  Recent Labs Lab 08/06/17 1021 08/08/17 0540  WBC 9.4 14.4*  NEUTROABS 7.4  --   HGB 12.2 11.9*  HCT 37.6 36.7  MCV 83.6 82.5  PLT 163 387   Basic Metabolic Panel:  Recent Labs Lab 08/06/17 1021 08/07/17 0600 08/08/17 0540  NA 135 134* 135  K 4.1 3.8 4.1  CL 102 101 100*  CO2 24 26 25   GLUCOSE 202* 315* 298*  BUN 22* 29* 34*  CREATININE 1.36* 1.48* 1.42*  CALCIUM 8.7* 8.6* 8.9  MG  --  2.0  --    GFR: Estimated Creatinine Clearance: 61.3 mL/min (A) (by C-G formula based on SCr of 1.42 mg/dL (H)). Liver Function Tests:  Recent Labs Lab 08/08/17 0540  AST 21  ALT 25  ALKPHOS 51  BILITOT 0.9  PROT 7.2  ALBUMIN 3.5   No results for input(s): LIPASE, AMYLASE in the last 168 hours. No results for input(s): AMMONIA in the last 168 hours. Coagulation Profile: No results for input(s): INR, PROTIME in the last 168 hours. Cardiac Enzymes: No results for input(s): CKTOTAL, CKMB, CKMBINDEX, TROPONINI in the last 168 hours. BNP (last 3 results) No results for input(s): PROBNP in the last 8760 hours. HbA1C:  Recent Labs  08/06/17 1021  HGBA1C 6.9*   CBG:  Recent Labs Lab 08/08/17 0739 08/08/17 1134 08/08/17 1632 08/08/17 2124 08/09/17 0730  GLUCAP 295* 344* 335* 311* 315*   Lipid Profile: No results for input(s): CHOL, HDL, LDLCALC, TRIG, CHOLHDL, LDLDIRECT in the last 72 hours. Thyroid Function Tests:  Recent Labs  08/07/17 0600  TSH 2.021   Anemia Panel: No results for input(s): VITAMINB12, FOLATE, FERRITIN, TIBC, IRON, RETICCTPCT in the last 72 hours. Sepsis Labs: No results for input(s): PROCALCITON, LATICACIDVEN in the last 168 hours.  Recent Results (from the past 240 hour(s))  MRSA PCR Screening     Status: None   Collection Time: 08/06/17  7:00  PM  Result Value Ref Range Status   MRSA by PCR NEGATIVE NEGATIVE Final    Comment:        The GeneXpert MRSA Assay (FDA approved for NASAL specimens only), is one component of a comprehensive MRSA colonization surveillance program. It is not intended to diagnose MRSA infection nor to guide or monitor treatment for MRSA infections.   Respiratory Panel by PCR     Status: Abnormal   Collection Time: 08/06/17  7:00 PM  Result Value Ref Range Status   Adenovirus NOT DETECTED NOT DETECTED Final   Coronavirus 229E NOT DETECTED NOT DETECTED Final   Coronavirus HKU1 NOT DETECTED NOT DETECTED Final   Coronavirus NL63 NOT DETECTED NOT DETECTED Final   Coronavirus OC43 NOT DETECTED NOT DETECTED Final   Metapneumovirus NOT DETECTED NOT DETECTED Final   Rhinovirus / Enterovirus DETECTED (A) NOT DETECTED Final   Influenza A NOT DETECTED NOT DETECTED Final   Influenza A  H1 NOT DETECTED NOT DETECTED Final   Influenza A H1 2009 NOT DETECTED NOT DETECTED Final   Influenza A H3 NOT DETECTED NOT DETECTED Final   Influenza B NOT DETECTED NOT DETECTED Final   Parainfluenza Virus 1 NOT DETECTED NOT DETECTED Final   Parainfluenza Virus 2 NOT DETECTED NOT DETECTED Final   Parainfluenza Virus 3 NOT DETECTED NOT DETECTED Final   Parainfluenza Virus 4 NOT DETECTED NOT DETECTED Final   Respiratory Syncytial Virus NOT DETECTED NOT DETECTED Final   Bordetella pertussis NOT DETECTED NOT DETECTED Final   Chlamydophila pneumoniae NOT DETECTED NOT DETECTED Final   Mycoplasma pneumoniae NOT DETECTED NOT DETECTED Final    Comment: Performed at [facility], [place] [address], [place], [place] [address]         Radiology Studies: Dg Chest Port 1 View  Result Date: 08/07/2017 CLINICAL DATA:  Shortness of Breath EXAM: PORTABLE CHEST 1 VIEW COMPARISON:  08/06/2017 FINDINGS: Cardiac shadow remains enlarged. Lungs are well aerated bilaterally. No focal infiltrate or sizable effusion is seen. No acute bony  abnormality is noted. IMPRESSION: No active disease. Electronically Signed   By: Inez Catalina M.D.   On: 08/07/2017 11:18        Scheduled Meds: . benzonatate  200 mg Oral TID  . carvedilol  12.5 mg Oral BID WC  . furosemide  20 mg Oral Daily  . guaiFENesin  1,200 mg Oral BID  . insulin aspart  0-20 Units Subcutaneous TID WC  . insulin aspart  0-5 Units Subcutaneous QHS  . insulin aspart  3 Units Subcutaneous TID WC  . insulin glargine  14 Units Subcutaneous Daily  . ipratropium  0.5 mg Nebulization BID  . levalbuterol  0.63 mg Nebulization BID  . levothyroxine  137 mcg Oral QAC breakfast  . methylPREDNISolone (SOLU-MEDROL) injection  60 mg Intravenous Daily  . multivitamin with minerals  1 tablet Oral Daily  . potassium chloride  10 mEq Oral Daily  . rivaroxaban  20 mg Oral q1800  . vitamin C  500 mg Oral BID  . vitamin E  400 Units Oral Daily   Continuous Infusions: . diltiazem (CARDIZEM) infusion 15 mg/hr (08/09/17 0800)     LOS: 2 days    Time spent: 54mins    Whitten Andreoni, MD [facility] Pager [phone]  If 7PM-7AM, please contact night-coverage [email or web address] Password [email or web address] 08/09/2017, 8:36 AM

## 2017-08-10 LAB — GLUCOSE, CAPILLARY
GLUCOSE-CAPILLARY: 144 mg/dL — AB (ref 65–99)
GLUCOSE-CAPILLARY: 270 mg/dL — AB (ref 65–99)
Glucose-Capillary: 204 mg/dL — ABNORMAL HIGH (ref 65–99)
Glucose-Capillary: 384 mg/dL — ABNORMAL HIGH (ref 65–99)
Glucose-Capillary: 416 mg/dL — ABNORMAL HIGH (ref 65–99)
Glucose-Capillary: 455 mg/dL — ABNORMAL HIGH (ref 65–99)
Glucose-Capillary: 458 mg/dL — ABNORMAL HIGH (ref 65–99)

## 2017-08-10 MED ORDER — PREDNISONE 20 MG PO TABS
40.0000 mg | ORAL_TABLET | Freq: Every day | ORAL | Status: DC
  Administered 2017-08-11: 40 mg via ORAL
  Filled 2017-08-10 (×2): qty 2

## 2017-08-10 MED ORDER — CARVEDILOL 12.5 MG PO TABS: 12.5000 mg | ORAL_TABLET | Freq: Once | ORAL | Status: AC

## 2017-08-10 MED ORDER — DILTIAZEM HCL ER COATED BEADS 240 MG PO CP24
240.0000 mg | ORAL_CAPSULE | Freq: Two times a day (BID) | ORAL | Status: DC
  Administered 2017-08-10 – 2017-08-11 (×3): 240 mg via ORAL
  Filled 2017-08-10 (×3): qty 1

## 2017-08-10 MED ORDER — IPRATROPIUM BROMIDE 0.02 % IN SOLN: 0.5000 mg | RESPIRATORY_TRACT | Status: DC | PRN

## 2017-08-10 MED ORDER — CARVEDILOL 12.5 MG PO TABS
25.0000 mg | ORAL_TABLET | Freq: Two times a day (BID) | ORAL | Status: DC
  Administered 2017-08-10 – 2017-08-11 (×2): 25 mg via ORAL
  Filled 2017-08-10 (×2): qty 2

## 2017-08-10 MED ORDER — INSULIN ASPART 100 UNIT/ML ~~LOC~~ SOLN
10.0000 [IU] | Freq: Once | SUBCUTANEOUS | Status: AC
  Administered 2017-08-10: 10 [IU] via SUBCUTANEOUS

## 2017-08-10 NOTE — Progress Notes (Signed)
Progress Note  Patient Name: Isabella Hall Date of Encounter: 08/10/2017  Primary Cardiologist: Dr. [name]  Subjective   Feeling much better with respect to breathing. HR better controlled.  Inpatient Medications    Scheduled Meds: . benzonatate  200 mg Oral TID  . carvedilol  12.5 mg Oral BID WC  . digoxin  0.125 mg Intravenous Daily  . furosemide  20 mg Oral Daily  . guaiFENesin  1,200 mg Oral BID  . insulin aspart  0-20 Units Subcutaneous TID WC  . insulin aspart  0-5 Units Subcutaneous QHS  . insulin aspart  8 Units Subcutaneous TID WC  . insulin glargine  20 Units Subcutaneous Daily  . levothyroxine  137 mcg Oral QAC breakfast  . methylPREDNISolone (SOLU-MEDROL) injection  60 mg Intravenous Daily  . multivitamin with minerals  1 tablet Oral Daily  . potassium chloride  10 mEq Oral Daily  . rivaroxaban  20 mg Oral q1800  . vitamin C  500 mg Oral BID  . vitamin E  400 Units Oral Daily   Continuous Infusions: . diltiazem (CARDIZEM) infusion 15 mg/hr (08/10/17 0726)   PRN Meds: acetaminophen **OR** acetaminophen, ipratropium, levalbuterol, ondansetron **OR** ondansetron (ZOFRAN) IV, sodium chloride   Vital Signs    Vitals:   08/10/17 0653 08/10/17 0700 08/10/17 0715 08/10/17 0800  BP:  (!) 137/99 (!) 146/94   Pulse:  81 81   Resp:  13 13   Temp:    97.6 F (36.4 C)  TempSrc:    Oral  SpO2:  95% 93%   Weight: (!) 302 lb 4 oz (137.1 kg)     Height:        Intake/Output Summary (Last 24 hours) at 08/10/17 0835 Last data filed at 08/10/17 0701  Gross per 24 hour  Intake          1185.25 ml  Output                0 ml  Net          1185.25 ml   Filed Weights   08/08/17 0500 08/09/17 0745 08/10/17 0653  Weight: (!) 300 lb 7.8 oz (136.3 kg) (!) 306 lb (138.8 kg) (!) 302 lb 4 oz (137.1 kg)    Telemetry    A fib, rates better controlled - Personally Reviewed  ECG      Physical Exam   GEN: No acute distress.   Neck: No JVD Cardiac: Irregular  rhythm, no murmurs, rubs, or gallops.  Respiratory: Diminished b/l, no significant wheezes, no crackles. GI: Soft, nontender, non-distended  MS: No edema; No deformity. Neuro:  Nonfocal  Psych: Normal affect   Labs    Chemistry Recent Labs Lab 08/06/17 1021 08/07/17 0600 08/08/17 0540 08/09/17 1147  NA 135 134* 135  --   K 4.1 3.8 4.1  --   CL 102 101 100*  --   CO2 24 26 25   --   GLUCOSE 202* 315* 298* 305*  BUN 22* 29* 34*  --   CREATININE 1.36* 1.48* 1.42*  --   CALCIUM 8.7* 8.6* 8.9  --   PROT  --   --  7.2  --   ALBUMIN  --   --  3.5  --   AST  --   --  21  --   ALT  --   --  25  --   ALKPHOS  --   --  51  --   BILITOT  --   --  0.9  --   GFRNONAA 42* 38* 40*  --   GFRAA 48* 44* 46*  --   ANIONGAP 9 7 10   --      Hematology Recent Labs Lab 08/06/17 1021 08/08/17 0540  WBC 9.4 14.4*  RBC 4.50 4.45  HGB 12.2 11.9*  HCT 37.6 36.7  MCV 83.6 82.5  MCH 27.1 26.7  MCHC 32.4 32.4  RDW 16.1* 16.2*  PLT 163 167    Cardiac EnzymesNo results for input(s): TROPONINI in the last 168 hours. No results for input(s): TROPIPOC in the last 168 hours.   BNP Recent Labs Lab 08/08/17 0540  BNP 334.0*     DDimer No results for input(s): DDIMER in the last 168 hours.   Radiology    No results found.  Cardiac Studies   ECHO 08/07/17 Study Conclusions  - Left ventricle: The cavity size was normal. Wall thickness was increased in a pattern of mild LVH. Systolic function was at the lower limits of normal. The estimated ejection fraction was 50%. The study was not technically sufficient to allow evaluation of LV diastolic dysfunction due to atrial fibrillation. - Mitral valve: There was mild regurgitation. - Left atrium: The atrium was severely dilated. - Systemic veins: IVC is dilated with normal respiratory variation. Estimated CVP 8 mmHg.  Patient Profile     61 y.o. female with permanent AF admitted with acute viral bronchitis and with rapid AF  on IV diltiazem.  Assessment & Plan    1. Rapid atrial fibrillation: HR better controlled. Symptomatically improved. I will wean off diltiazem infusion and resume home meds (will start Coreg at lower dose of 25 mg bid). Will dc digoxin. She has chronically elevated HR's and has been evaluated by EP, but they are not eager to consider AV nodal ablation and pacing. That being said, her cardiomyopathy is tachycardia-mediated so may need to reconsider this issue. Anticoagulated with Xarelto. Unlikely to maintain sinus rhythm given permanence of disease and severe left atrial dilatation. Currently on IV methylprednisolone 60 mg daily. This is contributing to elevated HR's to some degree.  2. Acute viral bronchitis: On oral steroids. Improving. Currently on IV methylprednisolone 60 mg daily.  3. Chronic diastolic heart failure: Euvolemic. Continue Lasix 20 mg daily.  4. HTN: BP mildly elevated. Resuming home meds.   Signed, Kate Sable, MD  08/10/2017, 8:35 AM

## 2017-08-10 NOTE — Progress Notes (Signed)
PROGRESS NOTE    Isabella Hall  SWN:[id number] DOB: 1956/01/30 DOA: 08/06/2017 PCP: Doree Albee, MD    Brief Narrative:  60 year old female with a history of chronic atrial fibrillation, hypertension, diabetes, chronic kidney disease stage III, presented with worsening shortness of breath and rapid atrial fibrillation. INR viral bronchitis. Treated with intravenous Cardizem for atrial fibrillation and supportive management for bronchitis.   Assessment & Plan:   Active Problems:   Benign essential HTN   Obesity-BMI 45   CKD (chronic kidney disease), stage III   Nonischemic cardiomyopathy (HCC)   Atrial fibrillation with RVR (HCC)   Acute bronchitis   Chronic diastolic CHF (congestive heart failure) ([place])   1. Atrial fibrillation with ventricular response. Precipitated by underlying respiratory illness. She is currently on Cardizem infusion and beta blockers. Overall heart rate is improving. Plan is to start on oral calcium channel blockers and wean off Cardizem infusion. She is anticoagulated with xarelto 2. Acute viral bronchitis. Respiratory panel positive for enterovirus. Antibiotics discontinued. Continue supportive management with bronchodilators. Change Solu-Medrol to prednisone taper. 3. Chronic kidney disease stage III. Creatinine at 1.4 and is currently at baseline. Continue to monitor. 4. Morbid obesity. Counseled on importance of diet and exercise 5. Nonischemic cardiomyopathy. Previous ejection fraction in 2/26 was 40-45%. Echocardiogram repeated during this admission showed EF 50%. Continue medical management. 6. Diabetes. Diet controlled. A1c is 6.1. Continue on sliding scale insulin. Blood sugars currently elevated due to steroids. Continue on sliding scale insulin and Lantus. Continue to wean steroids. 7. Hypothyroidism. TSH is in normal range. Continue on Synthroid   DVT prophylaxis: xarelto Code Status: full code Family Communication: no family  present Disposition Plan: discharge home once improved   Consultants:   cardiology  Procedures:  Echo: - Left ventricle: The cavity size was normal. Wall thickness was   increased in a pattern of mild LVH. Systolic function was at the   lower limits of normal. The estimated ejection fraction was 50%.   The study was not technically sufficient to allow evaluation of   LV diastolic dysfunction due to atrial fibrillation. - Mitral valve: There was mild regurgitation. - Left atrium: The atrium was severely dilated. - Systemic veins: IVC is dilated with normal respiratory variation.    Estimated CVP 8 mmHg.  Antimicrobials:   Azithromycin 8/20>>8/22   Subjective: Overall breathing is better today. No wheezing.  Objective: Vitals:   08/10/17 0653 08/10/17 0700 08/10/17 0715 08/10/17 0800  BP:  (!) 137/99 (!) 146/94   Pulse:  81 81   Resp:  13 13   Temp:    97.6 F (36.4 C)  TempSrc:    Oral  SpO2:  95% 93%   Weight: (!) 137.1 kg (302 lb 4 oz)     Height:        Intake/Output Summary (Last 24 hours) at 08/10/17 1009 Last data filed at 08/10/17 0701  Gross per 24 hour  Intake           795.25 ml  Output                0 ml  Net           795.25 ml   Filed Weights   08/08/17 0500 08/09/17 0745 08/10/17 0653  Weight: (!) 136.3 kg (300 lb 7.8 oz) (!) 138.8 kg (306 lb) (!) 137.1 kg (302 lb 4 oz)    Examination:  General exam: Appears calm and comfortable  Respiratory system: clear bilaterally. Respiratory  effort normal. Cardiovascular system: S1 & S2 heard, irregular. No JVD, murmurs, rubs, gallops or clicks. No pedal edema. Gastrointestinal system: Abdomen is nondistended, soft and nontender. No organomegaly or masses felt. Normal bowel sounds heard. Central nervous system: Alert and oriented. No focal neurological deficits. Extremities: Symmetric 5 x 5 power. Skin: No rashes, lesions or ulcers Psychiatry: Judgement and insight appear normal. Mood & affect  appropriate.     Data Reviewed: I have personally reviewed following labs and imaging studies  CBC:  Recent Labs Lab 08/06/17 1021 08/08/17 0540  WBC 9.4 14.4*  NEUTROABS 7.4  --   HGB 12.2 11.9*  HCT 37.6 36.7  MCV 83.6 82.5  PLT 163 448   Basic Metabolic Panel:  Recent Labs Lab 08/06/17 1021 08/07/17 0600 08/08/17 0540 08/09/17 1147  NA 135 134* 135  --   K 4.1 3.8 4.1  --   CL 102 101 100*  --   CO2 24 26 25   --   GLUCOSE 202* 315* 298* 305*  BUN 22* 29* 34*  --   CREATININE 1.36* 1.48* 1.42*  --   CALCIUM 8.7* 8.6* 8.9  --   MG  --  2.0  --   --    GFR: Estimated Creatinine Clearance: 60.9 mL/min (A) (by C-G formula based on SCr of 1.42 mg/dL (H)). Liver Function Tests:  Recent Labs Lab 08/08/17 0540  AST 21  ALT 25  ALKPHOS 51  BILITOT 0.9  PROT 7.2  ALBUMIN 3.5   No results for input(s): LIPASE, AMYLASE in the last 168 hours. No results for input(s): AMMONIA in the last 168 hours. Coagulation Profile: No results for input(s): INR, PROTIME in the last 168 hours. Cardiac Enzymes: No results for input(s): CKTOTAL, CKMB, CKMBINDEX, TROPONINI in the last 168 hours. BNP (last 3 results) No results for input(s): PROBNP in the last 8760 hours. HbA1C: No results for input(s): HGBA1C in the last 72 hours. CBG:  Recent Labs Lab 08/09/17 0730 08/09/17 1114 08/09/17 1649 08/09/17 2135 08/10/17 0737  GLUCAP 315* 474* 185* 133* 204*   Lipid Profile: No results for input(s): CHOL, HDL, LDLCALC, TRIG, CHOLHDL, LDLDIRECT in the last 72 hours. Thyroid Function Tests: No results for input(s): TSH, T4TOTAL, FREET4, T3FREE, THYROIDAB in the last 72 hours. Anemia Panel: No results for input(s): VITAMINB12, FOLATE, FERRITIN, TIBC, IRON, RETICCTPCT in the last 72 hours. Sepsis Labs: No results for input(s): PROCALCITON, LATICACIDVEN in the last 168 hours.  Recent Results (from the past 240 hour(s))  MRSA PCR Screening     Status: None   Collection Time:  08/06/17  7:00 PM  Result Value Ref Range Status   MRSA by PCR NEGATIVE NEGATIVE Final    Comment:        The GeneXpert MRSA Assay (FDA approved for NASAL specimens only), is one component of a comprehensive MRSA colonization surveillance program. It is not intended to diagnose MRSA infection nor to guide or monitor treatment for MRSA infections.   Respiratory Panel by PCR     Status: Abnormal   Collection Time: 08/06/17  7:00 PM  Result Value Ref Range Status   Adenovirus NOT DETECTED NOT DETECTED Final   Coronavirus 229E NOT DETECTED NOT DETECTED Final   Coronavirus HKU1 NOT DETECTED NOT DETECTED Final   Coronavirus NL63 NOT DETECTED NOT DETECTED Final   Coronavirus OC43 NOT DETECTED NOT DETECTED Final   Metapneumovirus NOT DETECTED NOT DETECTED Final   Rhinovirus / Enterovirus DETECTED (A) NOT DETECTED Final  Influenza A NOT DETECTED NOT DETECTED Final   Influenza A H1 NOT DETECTED NOT DETECTED Final   Influenza A H1 2009 NOT DETECTED NOT DETECTED Final   Influenza A H3 NOT DETECTED NOT DETECTED Final   Influenza B NOT DETECTED NOT DETECTED Final   Parainfluenza Virus 1 NOT DETECTED NOT DETECTED Final   Parainfluenza Virus 2 NOT DETECTED NOT DETECTED Final   Parainfluenza Virus 3 NOT DETECTED NOT DETECTED Final   Parainfluenza Virus 4 NOT DETECTED NOT DETECTED Final   Respiratory Syncytial Virus NOT DETECTED NOT DETECTED Final   Bordetella pertussis NOT DETECTED NOT DETECTED Final   Chlamydophila pneumoniae NOT DETECTED NOT DETECTED Final   Mycoplasma pneumoniae NOT DETECTED NOT DETECTED Final    Comment: Performed at [facility], [place] [address], [place], [place] [address]         Radiology Studies: No results found.      Scheduled Meds: . benzonatate  200 mg Oral TID  . carvedilol  12.5 mg Oral Once  . carvedilol  25 mg Oral BID WC  . diltiazem  240 mg Oral BID  . furosemide  20 mg Oral Daily  . guaiFENesin  1,200 mg Oral BID  . insulin aspart   0-20 Units Subcutaneous TID WC  . insulin aspart  0-5 Units Subcutaneous QHS  . insulin aspart  8 Units Subcutaneous TID WC  . insulin glargine  20 Units Subcutaneous Daily  . levothyroxine  137 mcg Oral QAC breakfast  . multivitamin with minerals  1 tablet Oral Daily  . potassium chloride  10 mEq Oral Daily  . [START ON 08/11/2017] predniSONE  40 mg Oral Q breakfast  . rivaroxaban  20 mg Oral q1800  . vitamin C  500 mg Oral BID  . vitamin E  400 Units Oral Daily   Continuous Infusions:    LOS: 3 days    Time spent: 40mins    Mackenzie Lia, MD [facility] Pager [phone]  If 7PM-7AM, please contact night-coverage [email or web address] Password [email or web address] 08/10/2017, 10:09 AM

## 2017-08-10 NOTE — Plan of Care (Signed)
Problem: Tissue Perfusion: Goal: Risk factors for ineffective tissue perfusion will decrease Outcome: Progressing PT REMAINS IN ATRIAL FIB, BUT RATE IS DECREASING.  Problem: Metabolic: Goal: Ability to maintain appropriate glucose levels will improve Outcome: Progressing CBG'S ARE IMPROVING

## 2017-08-11 LAB — GLUCOSE, CAPILLARY
GLUCOSE-CAPILLARY: 179 mg/dL — AB (ref 65–99)
GLUCOSE-CAPILLARY: 121 mg/dL — AB (ref 65–99)
Glucose-Capillary: 252 mg/dL — ABNORMAL HIGH (ref 65–99)

## 2017-08-11 MED ORDER — INSULIN GLARGINE 100 UNIT/ML SOLOSTAR PEN: 20.0000 [IU] | PEN_INJECTOR | Freq: Every day | SUBCUTANEOUS | 11 refills | Status: DC

## 2017-08-11 MED ORDER — PREDNISONE 10 MG PO TABS: ORAL_TABLET | ORAL | 0 refills | Status: DC

## 2017-08-11 MED ORDER — INSULIN PEN NEEDLE 31G X 5 MM MISC: 0 refills | Status: DC

## 2017-08-11 MED ORDER — CARVEDILOL 12.5 MG PO TABS
12.5000 mg | ORAL_TABLET | Freq: Once | ORAL | Status: AC
  Administered 2017-08-11: 12.5 mg via ORAL
  Filled 2017-08-11: qty 1

## 2017-08-11 MED ORDER — BLOOD GLUCOSE MONITOR KIT: PACK | 0 refills | Status: DC

## 2017-08-11 MED ORDER — LEVALBUTEROL TARTRATE 45 MCG/ACT IN AERO: 2.0000 | INHALATION_SPRAY | RESPIRATORY_TRACT | 2 refills | Status: DC | PRN

## 2017-08-11 MED ORDER — CARVEDILOL 12.5 MG PO TABS
37.5000 mg | ORAL_TABLET | Freq: Two times a day (BID) | ORAL | Status: DC
  Administered 2017-08-11: 37.5 mg via ORAL
  Filled 2017-08-11: qty 3

## 2017-08-11 MED ORDER — GUAIFENESIN ER 600 MG PO TB12: 600.0000 mg | ORAL_TABLET | Freq: Two times a day (BID) | ORAL | 0 refills | Status: DC

## 2017-08-11 NOTE — Progress Notes (Signed)
Dr. Roderic Palau notified of range of heart rate 80-130  while ambulating around the unit x2.

## 2017-08-11 NOTE — Discharge Summary (Signed)
Physician Discharge Summary  MANIE BEALER TIW:[id number] DOB: Feb 27, 1956 DOA: 08/06/2017  PCP: Doree Albee, MD  Admit date: 08/06/2017 Discharge date: 08/11/2017  Admitted From: home Disposition:  home  Recommendations for Outpatient Follow-up:  1. Follow up with PCP in 1-2 weeks 2. Please obtain BMP/CBC in one week 3. Follow-up with cardiology as an outpatient  Home Health: Equipment/Devices:  Discharge Condition: stable CODE STATUS: full code Diet recommendation: Heart Healthy / Carb Modified   Brief/Interim Summary: 61 year old female with a history of chronic atrial fibrillation, hypertension, diabetes, chronic kidney disease stage III, presented with worsening shortness of breath and rapid atrial fibrillation. INR viral bronchitis. Treated with intravenous Cardizem for atrial fibrillation and supportive management for bronchitis.  Discharge Diagnoses:  Active Problems:   Benign essential HTN   Obesity-BMI 45   CKD (chronic kidney disease), stage III   Nonischemic cardiomyopathy (HCC)   Atrial fibrillation with RVR (HCC)   Acute bronchitis   Chronic diastolic CHF (congestive heart failure) ([place])  1. Atrial fibrillation with ventricular response. Precipitated by underlying respiratory illness. Patient was treated with Cardizem infusion and continued on beta blockers. As heart rate improved, she was transitioned to her home dose of oral Cardizem. Heart rate is reasonably controlled. She is anticoagulated with xarelto 2. Acute viral bronchitis. Respiratory panel positive for enterovirus. Antibiotics discontinued. Treated supportively with steroids, bronchodilators and mucolytics. We'll discharge on prednisone taper. 3. Chronic kidney disease stage III. Creatinine at 1.4 and is currently at baseline. Continue to monitor. 4. Morbid obesity. Counseled on importance of diet and exercise 5. Nonischemic cardiomyopathy. Previous ejection fraction in 2/26 was 40-45%.  Echocardiogram repeated during this admission showed EF 50%. Continue medical management. 6. Diabetes. Diet controlled. A1c is 6.1. Blood sugars remain markedly elevated in the setting of steroids. Since she'll be discharged on prednisone taper, we will also discharge her on Lantus. This will need to be followed up with primary care physician. 7. Hypothyroidism. TSH is in normal range. Continue on Synthroid  Discharge Instructions  Discharge Instructions    Diet - low sodium heart healthy    Complete by:  As directed    Increase activity slowly    Complete by:  As directed      Allergies as of 08/11/2017   No Known Allergies     Medication List    TAKE these medications   blood glucose meter kit and supplies Kit Dispense based on patient and insurance preference. Use up to four times daily as directed. (FOR ICD-9 250.00, 250.01).   carvedilol 25 MG tablet Commonly known as:  COREG Take 1.5 tablets (37.5 mg total) by mouth 2 (two) times daily.   CINNAMON PO Take 1 capsule by mouth daily.   diltiazem 240 MG 24 hr capsule Commonly known as:  CARDIZEM CD TAKE ONE CAPSULE BY MOUTH TWICE DAILY   furosemide 20 MG tablet Commonly known as:  LASIX TAKE ONE TABLET BY MOUTH ONCE DAILY   GARLIC PO Take 1 tablet by mouth daily.   guaiFENesin 600 MG 12 hr tablet Commonly known as:  MUCINEX Take 1 tablet (600 mg total) by mouth 2 (two) times daily.   Insulin Glargine 100 UNIT/ML Solostar Pen Commonly known as:  LANTUS Inject 20 Units into the skin daily.   Insulin Pen Needle 31G X 5 MM Misc Use as directed   levalbuterol 45 MCG/ACT inhaler Commonly known as:  XOPENEX HFA Inhale 2 puffs into the lungs every 4 (four) hours as needed for wheezing.  levothyroxine 137 MCG tablet Commonly known as:  SYNTHROID, LEVOTHROID Take 1 tablet by mouth daily.   multivitamin with minerals Tabs tablet Take 1 tablet by mouth daily.   potassium chloride 10 MEQ tablet Commonly known as:   K-DUR Take 1 tablet (10 mEq total) by mouth daily.   predniSONE 10 MG tablet Commonly known as:  DELTASONE Take 63m po daily for 2 days then 376mdaily for 2 days then 2069maily for 2 days then 38m19mily for 2 days then stop   sodium chloride 0.65 % nasal spray Commonly known as:  OCEAN Place 1 spray into both nostrils 3 (three) times daily as needed. Uses daily, up to three times per day during allergy season   vitamin C 500 MG tablet Commonly known as:  ASCORBIC ACID Take 500 mg by mouth 2 (two) times daily.   vitamin E 400 UNIT capsule Take 400 Units by mouth daily.   XARELTO 20 MG Tabs tablet Generic drug:  rivaroxaban TAKE 1 TABLET BY MOUTH ONCE DAILY WITH SUPPER            Discharge Care Instructions        Start     Ordered   08/11/17 0000  guaiFENesin (MUCINEX) 600 MG 12 hr tablet  2 times daily     08/11/17 1703   08/11/17 0000  Insulin Glargine (LANTUS) 100 UNIT/ML Solostar Pen  Daily     08/11/17 1703   08/11/17 0000  predniSONE (DELTASONE) 10 MG tablet     08/11/17 1703   08/11/17 0000  levalbuterol (XOPENEX HFA) 45 MCG/ACT inhaler  Every 4 hours PRN     08/11/17 1703   08/11/17 0000  blood glucose meter kit and supplies KIT    Question Answer Comment  Number of strips 100   Number of lancets 100      08/11/17 1703   08/11/17 0000  Insulin Pen Needle 31G X 5 MM MISC     08/11/17 1709   08/11/17 0000  Increase activity slowly     08/11/17 1709   08/11/17 0000  Diet - low sodium heart healthy     08/11/17 1709      No Known Allergies  Consultations:  cardiology   Procedures/Studies: Dg Chest 2 View  Result Date: 08/06/2017 CLINICAL DATA:  Cough. EXAM: CHEST  2 VIEW COMPARISON:  07/04/2016 . FINDINGS: Mediastinum hilar structures normal. Low lung volumes. Small pleural effusions versus pleural scarring. Pneumothorax. Heart size stable. No acute bony abnormality . IMPRESSION: Low lung volumes. Small pleural effusions versus pleural scarring  noted on lateral view. Exam otherwise stable from prior exam. Electronically Signed   By: ThomMarcello Mooresgister   On: 08/06/2017 11:00   Dg Chest Port 1 View  Result Date: 08/07/2017 CLINICAL DATA:  Shortness of Breath EXAM: PORTABLE CHEST 1 VIEW COMPARISON:  08/06/2017 FINDINGS: Cardiac shadow remains enlarged. Lungs are well aerated bilaterally. No focal infiltrate or sizable effusion is seen. No acute bony abnormality is noted. IMPRESSION: No active disease. Electronically Signed   By: MarkInez Catalina.   On: 08/07/2017 11:18       Subjective: Feeling better. Shortness of breath improved. Able to ambulate without difficulty.  Discharge Exam: Vitals:   08/11/17 1100 08/11/17 1609  BP:    Pulse:    Resp:    Temp: 98 F (36.7 C) 98.1 F (36.7 C)  SpO2:     Vitals:   08/11/17 0746 08/11/17 0959 08/11/17 1100 08/11/17 1609  BP:  (!) 144/102    Pulse:      Resp:      Temp: 98.1 F (36.7 C)  98 F (36.7 C) 98.1 F (36.7 C)  TempSrc: Oral  Oral Oral  SpO2:      Weight:      Height:        General: Pt is alert, awake, not in acute distress Cardiovascular: irregular, S1/S2 +, no rubs, no gallops Respiratory: CTA bilaterally, no wheezing, no rhonchi Abdominal: Soft, NT, ND, bowel sounds + Extremities: no edema, no cyanosis    The results of significant diagnostics from this hospitalization (including imaging, microbiology, ancillary and laboratory) are listed below for reference.     Microbiology: Recent Results (from the past 240 hour(s))  MRSA PCR Screening     Status: None   Collection Time: 08/06/17  7:00 PM  Result Value Ref Range Status   MRSA by PCR NEGATIVE NEGATIVE Final    Comment:        The GeneXpert MRSA Assay (FDA approved for NASAL specimens only), is one component of a comprehensive MRSA colonization surveillance program. It is not intended to diagnose MRSA infection nor to guide or monitor treatment for MRSA infections.   Respiratory Panel by PCR      Status: Abnormal   Collection Time: 08/06/17  7:00 PM  Result Value Ref Range Status   Adenovirus NOT DETECTED NOT DETECTED Final   Coronavirus 229E NOT DETECTED NOT DETECTED Final   Coronavirus HKU1 NOT DETECTED NOT DETECTED Final   Coronavirus NL63 NOT DETECTED NOT DETECTED Final   Coronavirus OC43 NOT DETECTED NOT DETECTED Final   Metapneumovirus NOT DETECTED NOT DETECTED Final   Rhinovirus / Enterovirus DETECTED (A) NOT DETECTED Final   Influenza A NOT DETECTED NOT DETECTED Final   Influenza A H1 NOT DETECTED NOT DETECTED Final   Influenza A H1 2009 NOT DETECTED NOT DETECTED Final   Influenza A H3 NOT DETECTED NOT DETECTED Final   Influenza B NOT DETECTED NOT DETECTED Final   Parainfluenza Virus 1 NOT DETECTED NOT DETECTED Final   Parainfluenza Virus 2 NOT DETECTED NOT DETECTED Final   Parainfluenza Virus 3 NOT DETECTED NOT DETECTED Final   Parainfluenza Virus 4 NOT DETECTED NOT DETECTED Final   Respiratory Syncytial Virus NOT DETECTED NOT DETECTED Final   Bordetella pertussis NOT DETECTED NOT DETECTED Final   Chlamydophila pneumoniae NOT DETECTED NOT DETECTED Final   Mycoplasma pneumoniae NOT DETECTED NOT DETECTED Final    Comment: Performed at [facility], [place]. [address], [place], [place] [address]     Labs: BNP (last 3 results)  Recent Labs  08/08/17 0540  BNP 226.3*   Basic Metabolic Panel:  Recent Labs Lab 08/06/17 1021 08/07/17 0600 08/08/17 0540 08/09/17 1147  NA 135 134* 135  --   K 4.1 3.8 4.1  --   CL 102 101 100*  --   CO2 24 26 25   --   GLUCOSE 202* 315* 298* 305*  BUN 22* 29* 34*  --   CREATININE 1.36* 1.48* 1.42*  --   CALCIUM 8.7* 8.6* 8.9  --   MG  --  2.0  --   --    Liver Function Tests:  Recent Labs Lab 08/08/17 0540  AST 21  ALT 25  ALKPHOS 51  BILITOT 0.9  PROT 7.2  ALBUMIN 3.5   No results for input(s): LIPASE, AMYLASE in the last 168 hours. No results for input(s): AMMONIA in the  last 168 hours. CBC:  Recent  Labs Lab 08/06/17 1021 08/08/17 0540  WBC 9.4 14.4*  NEUTROABS 7.4  --   HGB 12.2 11.9*  HCT 37.6 36.7  MCV 83.6 82.5  PLT 163 167   Cardiac Enzymes: No results for input(s): CKTOTAL, CKMB, CKMBINDEX, TROPONINI in the last 168 hours. BNP: Invalid input(s): POCBNP CBG:  Recent Labs Lab 08/10/17 2225 08/11/17 0000 08/11/17 0745 08/11/17 1102 08/11/17 1608  GLUCAP 455* 384* 179* 252* 121*   D-Dimer No results for input(s): DDIMER in the last 72 hours. Hgb A1c No results for input(s): HGBA1C in the last 72 hours. Lipid Profile No results for input(s): CHOL, HDL, LDLCALC, TRIG, CHOLHDL, LDLDIRECT in the last 72 hours. Thyroid function studies No results for input(s): TSH, T4TOTAL, T3FREE, THYROIDAB in the last 72 hours.  Invalid input(s): FREET3 Anemia work up No results for input(s): VITAMINB12, FOLATE, FERRITIN, TIBC, IRON, RETICCTPCT in the last 72 hours. Urinalysis    Component Value Date/Time   COLORURINE YELLOW 07/03/2016 2302   APPEARANCEUR CLEAR 07/03/2016 2302   LABSPEC 1.020 07/03/2016 2302   PHURINE 5.5 07/03/2016 2302   GLUCOSEU 100 (A) 07/03/2016 2302   GLUCOSEU NEGATIVE 03/03/2015 1050   HGBUR NEGATIVE 07/03/2016 2302   BILIRUBINUR NEGATIVE 07/03/2016 2302   KETONESUR NEGATIVE 07/03/2016 2302   PROTEINUR TRACE (A) 07/03/2016 2302   UROBILINOGEN 0.2 03/03/2015 1050   NITRITE NEGATIVE 07/03/2016 2302   LEUKOCYTESUR TRACE (A) 07/03/2016 2302   Sepsis Labs Invalid input(s): PROCALCITONIN,  WBC,  LACTICIDVEN Microbiology Recent Results (from the past 240 hour(s))  MRSA PCR Screening     Status: None   Collection Time: 08/06/17  7:00 PM  Result Value Ref Range Status   MRSA by PCR NEGATIVE NEGATIVE Final    Comment:        The GeneXpert MRSA Assay (FDA approved for NASAL specimens only), is one component of a comprehensive MRSA colonization surveillance program. It is not intended to diagnose MRSA infection nor to guide or monitor treatment  for MRSA infections.   Respiratory Panel by PCR     Status: Abnormal   Collection Time: 08/06/17  7:00 PM  Result Value Ref Range Status   Adenovirus NOT DETECTED NOT DETECTED Final   Coronavirus 229E NOT DETECTED NOT DETECTED Final   Coronavirus HKU1 NOT DETECTED NOT DETECTED Final   Coronavirus NL63 NOT DETECTED NOT DETECTED Final   Coronavirus OC43 NOT DETECTED NOT DETECTED Final   Metapneumovirus NOT DETECTED NOT DETECTED Final   Rhinovirus / Enterovirus DETECTED (A) NOT DETECTED Final   Influenza A NOT DETECTED NOT DETECTED Final   Influenza A H1 NOT DETECTED NOT DETECTED Final   Influenza A H1 2009 NOT DETECTED NOT DETECTED Final   Influenza A H3 NOT DETECTED NOT DETECTED Final   Influenza B NOT DETECTED NOT DETECTED Final   Parainfluenza Virus 1 NOT DETECTED NOT DETECTED Final   Parainfluenza Virus 2 NOT DETECTED NOT DETECTED Final   Parainfluenza Virus 3 NOT DETECTED NOT DETECTED Final   Parainfluenza Virus 4 NOT DETECTED NOT DETECTED Final   Respiratory Syncytial Virus NOT DETECTED NOT DETECTED Final   Bordetella pertussis NOT DETECTED NOT DETECTED Final   Chlamydophila pneumoniae NOT DETECTED NOT DETECTED Final   Mycoplasma pneumoniae NOT DETECTED NOT DETECTED Final    Comment: Performed at [facility], [place]. [address], [place], [place] [address]     Time coordinating discharge: Over 30 minutes  SIGNED:   Kathie Dike, MD  [facility]  08/11/2017, 5:09 PM Pager   If 7PM-7AM, please contact night-coverage [email or web address] Password [email or web address]

## 2017-08-11 NOTE — Progress Notes (Signed)
Discharge instructions, appointments and medications reviewed with patient and printed materials given to patient.  Voiced understanding with teach back.

## 2017-08-11 NOTE — Progress Notes (Signed)
Insulin glargine reviewed with patient.  Insulin pen shown to patient and patient returned demonstration for use of pen and described locations appropriate for injecting insulin.  Described proper storage and demonstrated dialing pen to correct dose and attaching the needle to the syringe. Symptoms of low and high blood sugars reviewed with patient and measures to take if blood sugar is under 70.

## 2017-08-11 NOTE — Progress Notes (Signed)
Transported to exit via wheelchair.  All belongings gathered and taken with patient

## 2017-08-15 ENCOUNTER — Ambulatory Visit: Payer: BLUE CROSS/BLUE SHIELD | Admitting: Cardiovascular Disease

## 2017-09-13 ENCOUNTER — Other Ambulatory Visit: Payer: Self-pay | Admitting: Cardiovascular Disease

## 2017-10-01 ENCOUNTER — Other Ambulatory Visit: Payer: Self-pay | Admitting: Cardiovascular Disease

## 2017-10-15 DIAGNOSIS — I480 Paroxysmal atrial fibrillation: Secondary | ICD-10-CM | POA: Diagnosis not present

## 2017-10-15 DIAGNOSIS — E559 Vitamin D deficiency, unspecified: Secondary | ICD-10-CM | POA: Diagnosis not present

## 2017-10-15 DIAGNOSIS — Z23 Encounter for immunization: Secondary | ICD-10-CM | POA: Diagnosis not present

## 2017-10-15 DIAGNOSIS — E119 Type 2 diabetes mellitus without complications: Secondary | ICD-10-CM | POA: Diagnosis not present

## 2017-10-15 DIAGNOSIS — N183 Chronic kidney disease, stage 3 (moderate): Secondary | ICD-10-CM | POA: Diagnosis not present

## 2017-10-15 DIAGNOSIS — E039 Hypothyroidism, unspecified: Secondary | ICD-10-CM | POA: Diagnosis not present

## 2017-10-16 ENCOUNTER — Ambulatory Visit: Payer: BLUE CROSS/BLUE SHIELD | Admitting: Cardiovascular Disease

## 2017-10-23 ENCOUNTER — Encounter: Payer: Self-pay | Admitting: Cardiovascular Disease

## 2017-10-23 ENCOUNTER — Ambulatory Visit: Payer: BLUE CROSS/BLUE SHIELD | Admitting: Cardiovascular Disease

## 2017-10-23 VITALS — BP 120/92 | HR 132 | Ht 67.0 in | Wt 299.0 lb

## 2017-10-23 DIAGNOSIS — I10 Essential (primary) hypertension: Secondary | ICD-10-CM

## 2017-10-23 DIAGNOSIS — G4733 Obstructive sleep apnea (adult) (pediatric): Secondary | ICD-10-CM | POA: Diagnosis not present

## 2017-10-23 DIAGNOSIS — I5032 Chronic diastolic (congestive) heart failure: Secondary | ICD-10-CM | POA: Diagnosis not present

## 2017-10-23 DIAGNOSIS — I428 Other cardiomyopathies: Secondary | ICD-10-CM

## 2017-10-23 DIAGNOSIS — I4891 Unspecified atrial fibrillation: Secondary | ICD-10-CM

## 2017-10-23 DIAGNOSIS — Z9289 Personal history of other medical treatment: Secondary | ICD-10-CM

## 2017-10-23 NOTE — Progress Notes (Signed)
SUBJECTIVE: The patient presents for follow-up of chronic atrial fibrillation and chronic diastolic heart failure.  She was hospitalized in August 2018 for acute viral bronchitis and consequent rapid atrial fibrillation. A rate control strategy with long-term anticoagulation has been advised by Dr. [name] (EP). He has also recommended weight loss and potentially bariatric surgery.  She has been experiencing another episode of bronchitis.  She denies fevers. She has refused steroids as it causes hyperglycemia, increased appetite, and weight gain.  She has tried antibiotics and Mucinex.  She says she tends to get bronchitis about twice a year and it has lasted as long as 2 months.  She feels fatigued all the time but continues to work daily.  She has been sleeping in a recliner.  Creatinine 1.42 on 08/08/17.   Review of Systems: As per "subjective", otherwise negative.  No Known Allergies  Current Outpatient Medications  Medication Sig Dispense Refill  . blood glucose meter kit and supplies KIT Dispense based on patient and insurance preference. Use up to four times daily as directed. (FOR ICD-9 250.00, 250.01). 1 each 0  . carvedilol (COREG) 25 MG tablet TAKE ONE & ONE-HALF TABLETS BY MOUTH TWICE DAILY 90 tablet 6  . CINNAMON PO Take 1 capsule by mouth daily.    Marland Kitchen diltiazem (CARDIZEM CD) 240 MG 24 hr capsule TAKE ONE CAPSULE BY MOUTH TWICE DAILY 180 capsule 3  . furosemide (LASIX) 20 MG tablet TAKE ONE TABLET BY MOUTH ONCE DAILY 90 tablet 1  . GARLIC PO Take 1 tablet by mouth daily.    Marland Kitchen guaiFENesin (MUCINEX) 600 MG 12 hr tablet Take 1 tablet (600 mg total) by mouth 2 (two) times daily. 30 tablet 0  . levothyroxine (SYNTHROID, LEVOTHROID) 137 MCG tablet Take 1 tablet by mouth daily.  2  . Multiple Vitamin (MULTIVITAMIN WITH MINERALS) TABS tablet Take 1 tablet by mouth daily.    . potassium chloride (K-DUR) 10 MEQ tablet Take 1 tablet (10 mEq total) by mouth daily. 30 tablet 6  .  sodium chloride (OCEAN) 0.65 % nasal spray Place 1 spray into both nostrils 3 (three) times daily as needed. Uses daily, up to three times per day during allergy season    . vitamin C (ASCORBIC ACID) 500 MG tablet Take 500 mg by mouth 2 (two) times daily.    . vitamin E 400 UNIT capsule Take 400 Units by mouth daily.    Alveda Reasons 20 MG TABS tablet TAKE 1 TABLET BY MOUTH ONCE DAILY WITH  SUPPER 90 tablet 1   No current facility-administered medications for this visit.     Past Medical History:  Diagnosis Date  . Atrial fibrillation (HCC)     CHADSVASC score 4  . Atrial flutter ([place])   . Essential hypertension   . Hypothyroidism   . Obesity   . Type 2 diabetes mellitus ([place])     Past Surgical History:  Procedure Laterality Date  . CHOLECYSTECTOMY  1979  . TUBAL LIGATION  1980    Social History   Socioeconomic History  . Marital status: Divorced    Spouse name: Not on file  . Number of children: Not on file  . Years of education: Not on file  . Highest education level: Not on file  Social Needs  . Financial resource strain: Not on file  . Food insecurity - worry: Not on file  . Food insecurity - inability: Not on file  . Transportation needs - medical: Not  on file  . Transportation needs - non-medical: Not on file  Occupational History  . 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 Smoking status: Never Smoker  . Smokeless tobacco: Never Used  Substance and Sexual Activity  . Alcohol use: No    Alcohol/week: 0.0 oz  . Drug use: No  . Sexual activity: Yes    Birth control/protection: Surgical  Other Topics Concern  . Not on file  Social History Narrative  . Not on file     Vitals:   10/23/17 1547  BP: (!) 120/92  Pulse: (!) 132  SpO2: 98%  Weight: 299 lb (135.6 kg)  Height: 5' 7"  (1.702 m)    Wt Readings from Last 3 Encounters:  10/23/17 299 lb (135.6 kg)  08/11/17 298 lb 8.1 oz (135.4 kg)  02/23/17 281 lb (127.5 kg)      PHYSICAL EXAM General: NAD HEENT: Normal. Neck: No JVD, no thyromegaly. Lungs: Faint expiratory wheezes bilaterally, no crackles. CV: Tachycardic, irregular rhythm, normal S1/S2, no S3, no murmur. No pretibial or periankle edema.  Abdomen: Soft, nontender, obese.  Neurologic: Alert and oriented.  Psych: Normal affect. Skin: Normal. Musculoskeletal: No gross deformities.    ECG: Most recent ECG reviewed.   Labs: Lab Results  Component Value Date/Time   K 4.1 08/08/2017 05:40 AM   BUN 34 (H) 08/08/2017 05:40 AM   CREATININE 1.42 (H) 08/08/2017 05:40 AM   CREATININE 1.51 (H) 02/08/2015 04:40 PM   ALT 25 08/08/2017 05:40 AM   TSH 2.021 08/07/2017 06:00 AM   TSH 1.323 01/20/2015 01:22 AM   HGB 11.9 (L) 08/08/2017 05:40 AM     Lipids: Lab Results  Component Value Date/Time   LDLCALC 90 01/21/2015 08:52 AM   CHOL 147 01/21/2015 08:52 AM   TRIG 61 01/21/2015 08:52 AM   HDL 45 01/21/2015 08:52 AM       ASSESSMENT AND PLAN:  1. Rapid atrial fibrillation: Currently on carvedilol 37.5 mg twice daily and long-acting diltiazem 240 mg twice daily.  She is appropriately anticoagulated with Xarelto 20 mg daily. She remains tachycardic on high doses of carvedilol and long-acting diltiazem.  She feels chronically fatigued.  She may need AV node ablation and pacemaker.  I will ask EP's (Dr. [name]) opinion.  2. Tachycardia-mediated cardiomyopathy: Left ventricular systolic function is low normal, LVEF 50% on 08/07/17.  There was mild mitral regurgitation and severe left atrial dilatation.  She remains tachycardic on high doses of carvedilol and long-acting diltiazem.  She may need AV node ablation and pacemaker.   I will ask EP's (Dr. [name]) opinion.  3. Essential HTN:  Mildly elevated diastolic blood pressure.  No changes to therapy.  4. Morbid obesity:  After losing a considerable amount of weight, she has gained it back probably from prednisone use with hyperglycemia and  increased appetite.    Disposition: Follow up with me in 6 weeks.  I will try and obtain an earlier appointment with Dr. [name].   Kate Sable, M.D., F.A.C.C.

## 2017-10-23 NOTE — Patient Instructions (Signed)
Medication Instructions:  Continue all current medications.  Labwork: none  Testing/Procedures: none  Follow-Up: 6 weeks   Any Other Special Instructions Will Be Listed Below (If Applicable).  If you need a refill on your cardiac medications before your next appointment, please call your pharmacy.  

## 2017-12-10 ENCOUNTER — Encounter: Payer: Self-pay | Admitting: *Deleted

## 2017-12-11 IMAGING — CR DG CHEST 1V PORT
1 series · 1 of 1 positions shown · non-contrast
Comparison: 08/06/2017

CLINICAL DATA: Shortness of Breath

EXAM:
PORTABLE CHEST 1 VIEW

[portable]
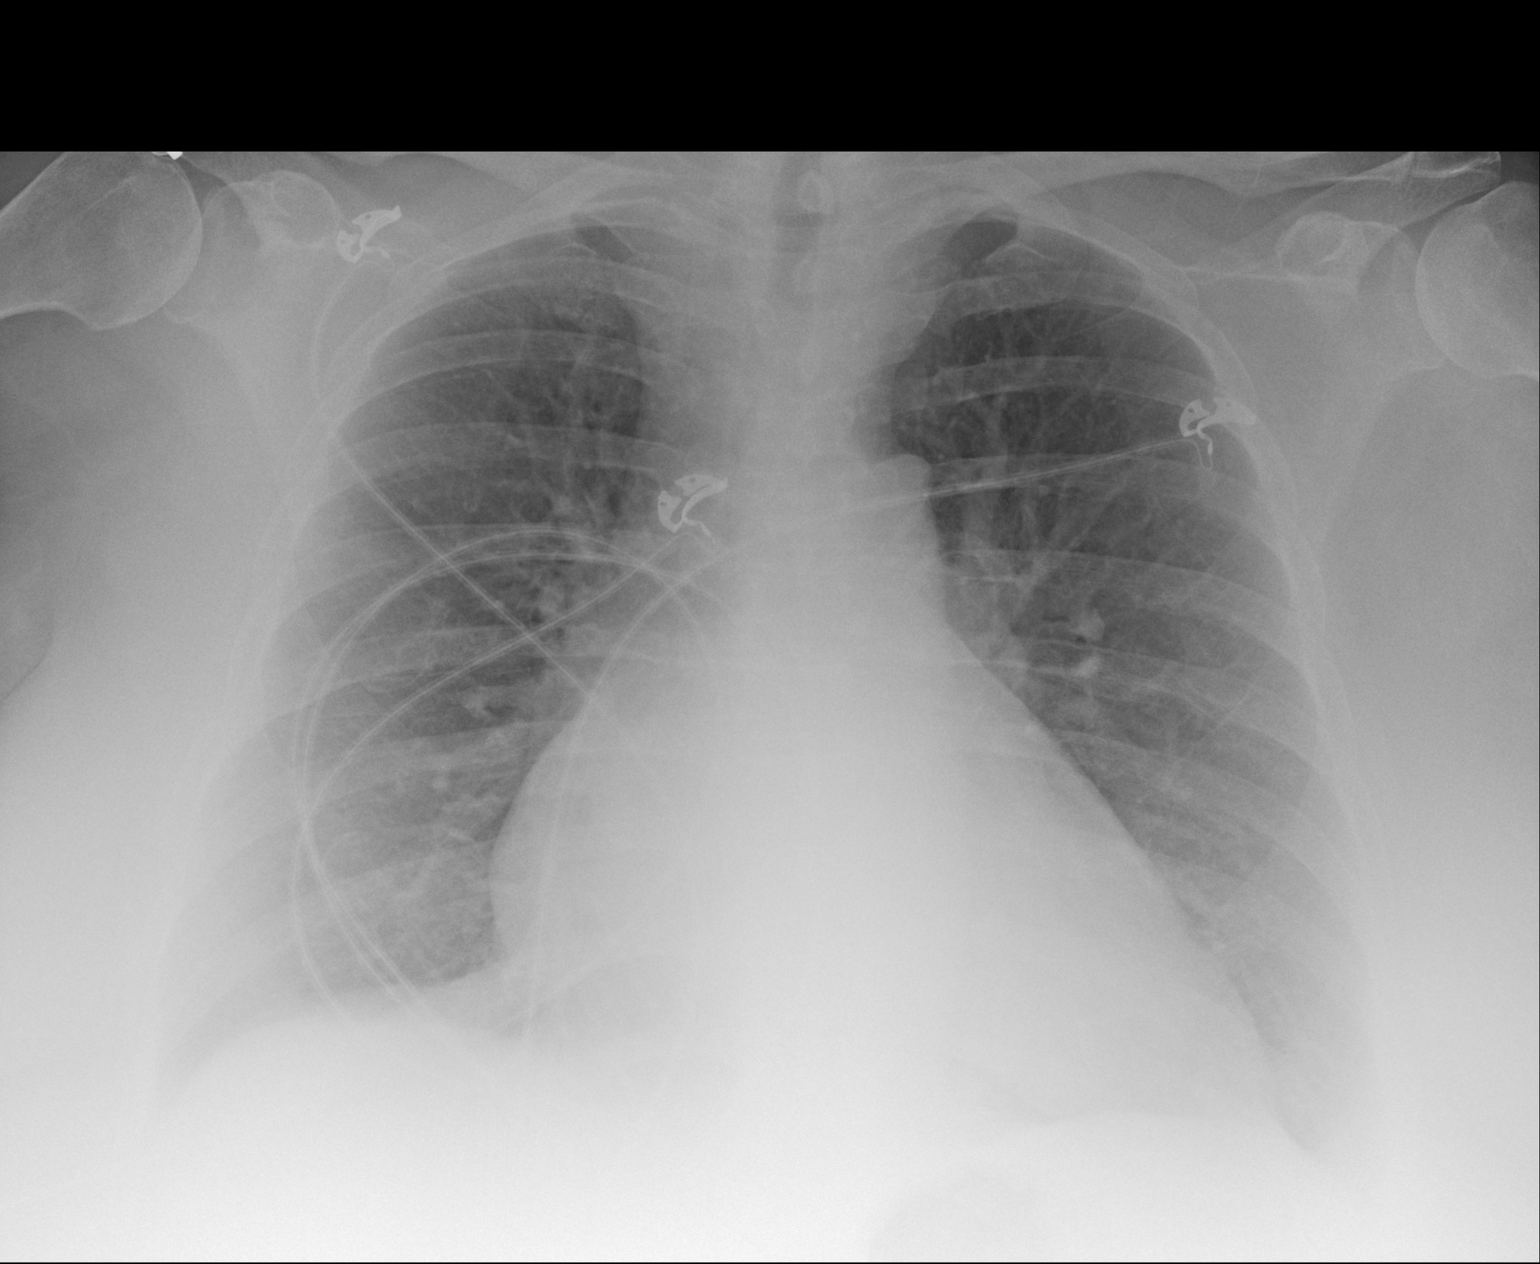

[1 of 1 positions shown; findings below may reference images not displayed]

FINDINGS: Cardiac shadow remains enlarged. Lungs are well aerated bilaterally.
No focal infiltrate or sizable effusion is seen. No acute bony
abnormality is noted.
IMPRESSION: No active disease.

## 2017-12-12 ENCOUNTER — Encounter: Payer: Self-pay | Admitting: Cardiovascular Disease

## 2017-12-12 ENCOUNTER — Other Ambulatory Visit: Payer: Self-pay

## 2017-12-12 ENCOUNTER — Ambulatory Visit (INDEPENDENT_AMBULATORY_CARE_PROVIDER_SITE_OTHER): Payer: BLUE CROSS/BLUE SHIELD | Admitting: Cardiovascular Disease

## 2017-12-12 VITALS — BP 122/88 | HR 104 | Ht 66.0 in | Wt 291.0 lb

## 2017-12-12 DIAGNOSIS — I10 Essential (primary) hypertension: Secondary | ICD-10-CM | POA: Diagnosis not present

## 2017-12-12 DIAGNOSIS — I5032 Chronic diastolic (congestive) heart failure: Secondary | ICD-10-CM | POA: Diagnosis not present

## 2017-12-12 DIAGNOSIS — I4891 Unspecified atrial fibrillation: Secondary | ICD-10-CM

## 2017-12-12 DIAGNOSIS — I428 Other cardiomyopathies: Secondary | ICD-10-CM

## 2017-12-12 NOTE — Patient Instructions (Addendum)
Medication Instructions:  Continue all current medications.  Labwork: none  Testing/Procedures: none  Follow-Up:  Your physician wants you to follow up in: 6 months.  You will receive a reminder letter in the mail one-two months in advance.  If you don't receive a letter, please call our office to schedule the follow up appointment.  Dr. [name] - atrial fibrillation   Any Other Special Instructions Will Be Listed Below (If Applicable).  If you need a refill on your cardiac medications before your next appointment, please call your pharmacy.

## 2017-12-12 NOTE — Progress Notes (Signed)
SUBJECTIVE: The patient presents for follow-up of chronic atrial fibrillation and chronic diastolic heart failure.  She was hospitalized in August 2018 for acute viral bronchitis and consequent rapid atrial fibrillation. A rate control strategy with long-term anticoagulation has been advised by Dr. [name] (EP).He hasalso recommended weight loss and potentially bariatric surgery.  She is feeling much better now and has gotten over bronchitis.  She denies chest pain, palpitations, leg swelling, and shortness of breath.  She has lost 8 pounds since her last visit with me.  She has been strict about avoiding calorie dense food.     Review of Systems: As per "subjective", otherwise negative.  No Known Allergies  Current Outpatient Medications  Medication Sig Dispense Refill  . blood glucose meter kit and supplies KIT Dispense based on patient and insurance preference. Use up to four times daily as directed. (FOR ICD-9 250.00, 250.01). 1 each 0  . carvedilol (COREG) 25 MG tablet TAKE ONE & ONE-HALF TABLETS BY MOUTH TWICE DAILY 90 tablet 6  . CINNAMON PO Take 1 capsule by mouth daily.    Marland Kitchen diltiazem (CARDIZEM CD) 240 MG 24 hr capsule TAKE ONE CAPSULE BY MOUTH TWICE DAILY 180 capsule 3  . furosemide (LASIX) 20 MG tablet TAKE ONE TABLET BY MOUTH ONCE DAILY 90 tablet 1  . GARLIC PO Take 1 tablet by mouth daily.    Marland Kitchen guaiFENesin (MUCINEX) 600 MG 12 hr tablet Take 1 tablet (600 mg total) by mouth 2 (two) times daily. 30 tablet 0  . levothyroxine (SYNTHROID, LEVOTHROID) 137 MCG tablet Take 1 tablet by mouth daily.  2  . Multiple Vitamin (MULTIVITAMIN WITH MINERALS) TABS tablet Take 1 tablet by mouth daily.    . potassium chloride (K-DUR) 10 MEQ tablet Take 1 tablet (10 mEq total) by mouth daily. 30 tablet 6  . sodium chloride (OCEAN) 0.65 % nasal spray Place 1 spray into both nostrils 3 (three) times daily as needed. Uses daily, up to three times per day during allergy season    . vitamin C  (ASCORBIC ACID) 500 MG tablet Take 500 mg by mouth 2 (two) times daily.    . vitamin E 400 UNIT capsule Take 400 Units by mouth daily.    Alveda Reasons 20 MG TABS tablet TAKE 1 TABLET BY MOUTH ONCE DAILY WITH  SUPPER 90 tablet 1   No current facility-administered medications for this visit.     Past Medical History:  Diagnosis Date  . Atrial fibrillation (HCC)     CHADSVASC score 4  . Atrial flutter ([place])   . Essential hypertension   . Hypothyroidism   . Obesity   . Type 2 diabetes mellitus ([place])     Past Surgical History:  Procedure Laterality Date  . CARDIOVERSION N/A 01/25/2015   Procedure: CARDIOVERSION;  Surgeon: Satira Sark, MD;  Location: [facility];  Service: Cardiovascular;  Laterality: N/A;  . CHOLECYSTECTOMY  1979  . LEFT HEART CATHETERIZATION WITH CORONARY ANGIOGRAM N/A 01/27/2015   Procedure: LEFT HEART CATHETERIZATION WITH CORONARY ANGIOGRAM;  Surgeon: Sinclair Grooms, MD;  Location: [facility] CATH LAB;  Service: Cardiovascular;  Laterality: N/A;  . TEE WITHOUT CARDIOVERSION N/A 01/25/2015   Procedure: TRANSESOPHAGEAL ECHOCARDIOGRAM (TEE);  Surgeon: Satira Sark, MD;  Location: [facility];  Service: Cardiovascular;  Laterality: N/A;  . [place] History   Socioeconomic History  . Marital status: Divorced    Spouse name: Not on file  .  Number of children: Not on file  . Years of education: Not on file  . Highest education level: Not on file  Social Needs  . Financial resource strain: Not on file  . Food insecurity - worry: Not on file  . Food insecurity - inability: Not on file  . Transportation needs - medical: Not on file  . Transportation needs - non-medical: Not on file  Occupational History  . 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 Smoking status: Never Smoker  . Smokeless tobacco: Never Used  Substance and Sexual Activity  . Alcohol use: No    Alcohol/week: 0.0 oz  . Drug use: No  . Sexual activity:  Yes    Birth control/protection: Surgical  Other Topics Concern  . Not on file  Social History Narrative  . Not on file     Vitals:   12/12/17 1544  BP: 122/88  Pulse: (!) 104  SpO2: 98%  Weight: 291 lb (132 kg)  Height: _0  (1.676 m)    Wt Readings from Last 3 Encounters:  12/12/17 291 lb (132 kg)  10/23/17 299 lb (135.6 kg)  08/11/17 298 lb 8.1 oz (135.4 kg)     PHYSICAL EXAM General: NAD HEENT: Normal. Neck: No JVD, no thyromegaly. Lungs: Clear to auscultation bilaterally with normal respiratory effort. CV: Tachycardic, irregular rhythm, normal S1/S2, no S3, no murmur. No pretibial or periankle edema.  No carotid bruit.   Abdomen: Soft, nontender, no distention.  Neurologic: Alert and oriented.  Psych: Normal affect. Skin: Normal. Musculoskeletal: No gross deformities.    ECG: Most recent ECG reviewed.   Labs: Lab Results  Component Value Date/Time   K 4.1 08/08/2017 05:40 AM   BUN 34 (H) 08/08/2017 05:40 AM   CREATININE 1.42 (H) 08/08/2017 05:40 AM   CREATININE 1.51 (H) 02/08/2015 04:40 PM   ALT 25 08/08/2017 05:40 AM   TSH 2.021 08/07/2017 06:00 AM   TSH 1.323 01/20/2015 01:22 AM   HGB 11.9 (L) 08/08/2017 05:40 AM     Lipids: Lab Results  Component Value Date/Time   LDLCALC 90 01/21/2015 08:52 AM   CHOL 147 01/21/2015 08:52 AM   TRIG 61 01/21/2015 08:52 AM   HDL 45 01/21/2015 08:52 AM       ASSESSMENT AND PLAN: 1. Rapid atrial fibrillation:  Heart rate has improved considerably since getting over bronchitis.  She is still mildly tachycardic.  She is currently on carvedilol 37.5 mg twice daily and long-acting diltiazem 240 mg twice daily.  She is appropriately anticoagulated with Xarelto 20 mg daily. She feels chronically fatigued.  She may need AV node ablation and pacemaker.  I have already made a referral to EP (Dr. [name]) to obtain his opinion.  2. Tachycardia-mediated cardiomyopathy: Left ventricular systolic function is low normal,  LVEF 50% on 08/07/17.  There was mild mitral regurgitation and severe left atrial dilatation.  She remains mildly tachycardic on high doses of carvedilol and long-acting diltiazem.  She may need AV node ablation and pacemaker.  I have already made a referral to EP (Dr. [name]) to obtain his opinion.  3. Essential HTN: Mildly elevated diastolic blood pressure.  No changes to therapy.  I congratulated her on her weight loss which should hopefully improve blood pressure control in the long-term.  4. Morbid obesity:  She has lost 9 pounds since her last visit with me in November.  I congratulated her on this and encouraged her to continue with her  efforts.    Disposition: Follow up 6 months with me.  Follow-up with Dr. [name] in the near future.   Kate Sable, M.D., F.A.C.C.

## 2018-01-15 DIAGNOSIS — Z0000 Encounter for general adult medical examination without abnormal findings: Secondary | ICD-10-CM | POA: Diagnosis not present

## 2018-01-15 DIAGNOSIS — E039 Hypothyroidism, unspecified: Secondary | ICD-10-CM | POA: Diagnosis not present

## 2018-01-15 DIAGNOSIS — N183 Chronic kidney disease, stage 3 (moderate): Secondary | ICD-10-CM | POA: Diagnosis not present

## 2018-01-15 DIAGNOSIS — E2839 Other primary ovarian failure: Secondary | ICD-10-CM | POA: Diagnosis not present

## 2018-01-15 DIAGNOSIS — E785 Hyperlipidemia, unspecified: Secondary | ICD-10-CM | POA: Diagnosis not present

## 2018-01-15 DIAGNOSIS — E119 Type 2 diabetes mellitus without complications: Secondary | ICD-10-CM | POA: Diagnosis not present

## 2018-02-04 DIAGNOSIS — Z780 Asymptomatic menopausal state: Secondary | ICD-10-CM | POA: Diagnosis not present

## 2018-02-04 DIAGNOSIS — E039 Hypothyroidism, unspecified: Secondary | ICD-10-CM | POA: Diagnosis not present

## 2018-02-20 ENCOUNTER — Other Ambulatory Visit: Payer: Self-pay | Admitting: Cardiovascular Disease

## 2018-03-08 ENCOUNTER — Ambulatory Visit (INDEPENDENT_AMBULATORY_CARE_PROVIDER_SITE_OTHER): Payer: BLUE CROSS/BLUE SHIELD | Admitting: Internal Medicine

## 2018-03-08 ENCOUNTER — Other Ambulatory Visit: Payer: Self-pay

## 2018-03-08 ENCOUNTER — Encounter: Payer: Self-pay | Admitting: Internal Medicine

## 2018-03-08 VITALS — BP 133/81 | HR 110 | Ht 66.0 in | Wt 269.0 lb

## 2018-03-08 DIAGNOSIS — I10 Essential (primary) hypertension: Secondary | ICD-10-CM

## 2018-03-08 DIAGNOSIS — I4819 Other persistent atrial fibrillation: Secondary | ICD-10-CM

## 2018-03-08 DIAGNOSIS — I428 Other cardiomyopathies: Secondary | ICD-10-CM

## 2018-03-08 DIAGNOSIS — I481 Persistent atrial fibrillation: Secondary | ICD-10-CM

## 2018-03-08 NOTE — Progress Notes (Signed)
PCP: Doree Albee, MD Primary Cardiologist: Dr [name] Primary EP: Dr Serita Kyle Isabella Hall is a 62 y.o. female who presents today for routine electrophysiology followup.  Since last being seen in our clinic, the patient reports doing very well.  She has lost 30 lbs!  She is exercising regularly and workinig diligently on diet.  She has made dramatic lifestyle change.  She feels well.  Remains in afib with RVR.   Today, she denies symptoms of palpitations, chest pain, shortness of breath,  lower extremity edema, dizziness, presyncope, or syncope.  The patient is otherwise without complaint today.   Past Medical History:  Diagnosis Date  . Atrial fibrillation (HCC)     CHADSVASC score 4  . Atrial flutter ([place])   . Essential hypertension   . Hypothyroidism   . Obesity   . Type 2 diabetes mellitus ([place])    Past Surgical History:  Procedure Laterality Date  . CARDIOVERSION N/A 01/25/2015   Procedure: CARDIOVERSION;  Surgeon: Satira Sark, MD;  Location: [facility];  Service: Cardiovascular;  Laterality: N/A;  . CHOLECYSTECTOMY  1979  . LEFT HEART CATHETERIZATION WITH CORONARY ANGIOGRAM N/A 01/27/2015   Procedure: LEFT HEART CATHETERIZATION WITH CORONARY ANGIOGRAM;  Surgeon: Sinclair Grooms, MD;  Location: [facility] CATH LAB;  Service: Cardiovascular;  Laterality: N/A;  . TEE WITHOUT CARDIOVERSION N/A 01/25/2015   Procedure: TRANSESOPHAGEAL ECHOCARDIOGRAM (TEE);  Surgeon: Satira Sark, MD;  Location: [facility];  Service: Cardiovascular;  Laterality: N/A;  . [place] are reviewed and negatives except as per HPI above  Current Outpatient Medications  Medication Sig Dispense Refill  . blood glucose meter kit and supplies KIT Dispense based on patient and insurance preference. Use up to four times daily as directed. (FOR ICD-9 250.00, 250.01). 1 each 0  . carvedilol (COREG) 25 MG tablet TAKE ONE & ONE-HALF TABLETS BY MOUTH TWICE DAILY 90 tablet 6  .  CINNAMON PO Take 1 capsule by mouth daily.    Marland Kitchen diltiazem (CARDIZEM CD) 240 MG 24 hr capsule TAKE ONE CAPSULE BY MOUTH TWICE DAILY 180 capsule 3  . estradiol (ESTRACE) 0.5 MG tablet Take 0.5 mg by mouth daily.    . furosemide (LASIX) 20 MG tablet TAKE ONE TABLET BY MOUTH ONCE DAILY 90 tablet 1  . GARLIC PO Take 1 tablet by mouth daily.    Marland Kitchen guaiFENesin (MUCINEX) 600 MG 12 hr tablet Take 1 tablet (600 mg total) by mouth 2 (two) times daily. 30 tablet 0  . Multiple Vitamin (MULTIVITAMIN WITH MINERALS) TABS tablet Take 1 tablet by mouth daily.    . potassium chloride (K-DUR) 10 MEQ tablet Take 1 tablet (10 mEq total) by mouth daily. 30 tablet 6  . progesterone (PROMETRIUM) 100 MG capsule Take 100 mg by mouth daily.    . sodium chloride (OCEAN) 0.65 % nasal spray Place 1 spray into both nostrils 3 (three) times daily as needed. Uses daily, up to three times per day during allergy season    . thyroid (ARMOUR) 60 MG tablet Take 60 mg by mouth daily before breakfast.    . vitamin C (ASCORBIC ACID) 500 MG tablet Take 500 mg by mouth 2 (two) times daily.    . vitamin E 400 UNIT capsule Take 400 Units by mouth daily.    Alveda Reasons 20 MG TABS tablet TAKE 1 TABLET BY MOUTH ONCE DAILY WITH SUPPER 90 tablet 3   No current  facility-administered medications for this visit.     Physical Exam: Vitals:   03/08/18 1532  BP: 133/81  Pulse: (!) 110  SpO2: 96%  Weight: 269 lb (122 kg)  Height: _0  (1.676 m)    GEN- The patient is well appearing, alert and oriented x 3 today.   Head- normocephalic, atraumatic Eyes-  Sclera clear, conjunctiva pink Ears- hearing intact Oropharynx- clear Lungs- Clear to ausculation bilaterally, normal work of breathing Heart- Regular rate and rhythm, no murmurs, rubs or gallops, PMI not laterally displaced GI- soft, NT, ND, + BS Extremities- no clubbing, cyanosis, or edema  EKG tracing ordered today is personally reviewed and shows afib with RVR  Assessment and  Plan:  1. Persistent atrial fibrillation with RVR. Given lifestyle modification, I am much more hopeful that we can make positive impact on her afib. Therapeutic strategies for afib including medicine and ablation were discussed in detail with the patient today. Risk, benefits, and alternatives to EP study and radiofrequency ablation for afib were also discussed in detail today. These risks include but are not limited to stroke, bleeding, vascular damage, tamponade, perforation, damage to the esophagus, lungs, and other structures, pulmonary vein stenosis, worsening renal function, and death. The patient understands these risk and wishes to proceed.  We will therefore proceed with catheter ablation at the next available time.  She will require cardiac CT prior to ablation to exclude LAA thrombus.  Will request Carto, ICE, and anesthesia for this procedure  2. Nonischemic CM Previously due to tachycardia Has improved with rate control, though I worry about recurrence given difficult to control V rates Would consider ablation as above  3. HTN Stable No change required today  4. Obesity Body mass index is 43.42 kg/m. I am very pleased with her lifestyle change.  30 lbs lost so far! Continue current strategy   Thompson Grayer MD, [facility] 03/08/2018 4:08 PM

## 2018-03-08 NOTE — H&P (View-Only) (Signed)
PCP: Doree Albee, MD Primary Cardiologist: Dr [name] Primary EP: Dr Serita Kyle Isabella Hall is a 62 y.o. female who presents today for routine electrophysiology followup.  Since last being seen in our clinic, the patient reports doing very well.  She has lost 30 lbs!  She is exercising regularly and workinig diligently on diet.  She has made dramatic lifestyle change.  She feels well.  Remains in afib with RVR.   Today, she denies symptoms of palpitations, chest pain, shortness of breath,  lower extremity edema, dizziness, presyncope, or syncope.  The patient is otherwise without complaint today.   Past Medical History:  Diagnosis Date  . Atrial fibrillation (HCC)     CHADSVASC score 4  . Atrial flutter ([place])   . Essential hypertension   . Hypothyroidism   . Obesity   . Type 2 diabetes mellitus ([place])    Past Surgical History:  Procedure Laterality Date  . CARDIOVERSION N/A 01/25/2015   Procedure: CARDIOVERSION;  Surgeon: Satira Sark, MD;  Location: [facility];  Service: Cardiovascular;  Laterality: N/A;  . CHOLECYSTECTOMY  1979  . LEFT HEART CATHETERIZATION WITH CORONARY ANGIOGRAM N/A 01/27/2015   Procedure: LEFT HEART CATHETERIZATION WITH CORONARY ANGIOGRAM;  Surgeon: Sinclair Grooms, MD;  Location: [facility] CATH LAB;  Service: Cardiovascular;  Laterality: N/A;  . TEE WITHOUT CARDIOVERSION N/A 01/25/2015   Procedure: TRANSESOPHAGEAL ECHOCARDIOGRAM (TEE);  Surgeon: Satira Sark, MD;  Location: [facility];  Service: Cardiovascular;  Laterality: N/A;  . [place] are reviewed and negatives except as per HPI above  Current Outpatient Medications  Medication Sig Dispense Refill  . blood glucose meter kit and supplies KIT Dispense based on patient and insurance preference. Use up to four times daily as directed. (FOR ICD-9 250.00, 250.01). 1 each 0  . carvedilol (COREG) 25 MG tablet TAKE ONE & ONE-HALF TABLETS BY MOUTH TWICE DAILY 90 tablet 6  .  CINNAMON PO Take 1 capsule by mouth daily.    Marland Kitchen diltiazem (CARDIZEM CD) 240 MG 24 hr capsule TAKE ONE CAPSULE BY MOUTH TWICE DAILY 180 capsule 3  . estradiol (ESTRACE) 0.5 MG tablet Take 0.5 mg by mouth daily.    . furosemide (LASIX) 20 MG tablet TAKE ONE TABLET BY MOUTH ONCE DAILY 90 tablet 1  . GARLIC PO Take 1 tablet by mouth daily.    Marland Kitchen guaiFENesin (MUCINEX) 600 MG 12 hr tablet Take 1 tablet (600 mg total) by mouth 2 (two) times daily. 30 tablet 0  . Multiple Vitamin (MULTIVITAMIN WITH MINERALS) TABS tablet Take 1 tablet by mouth daily.    . potassium chloride (K-DUR) 10 MEQ tablet Take 1 tablet (10 mEq total) by mouth daily. 30 tablet 6  . progesterone (PROMETRIUM) 100 MG capsule Take 100 mg by mouth daily.    . sodium chloride (OCEAN) 0.65 % nasal spray Place 1 spray into both nostrils 3 (three) times daily as needed. Uses daily, up to three times per day during allergy season    . thyroid (ARMOUR) 60 MG tablet Take 60 mg by mouth daily before breakfast.    . vitamin C (ASCORBIC ACID) 500 MG tablet Take 500 mg by mouth 2 (two) times daily.    . vitamin E 400 UNIT capsule Take 400 Units by mouth daily.    Alveda Reasons 20 MG TABS tablet TAKE 1 TABLET BY MOUTH ONCE DAILY WITH SUPPER 90 tablet 3   No current  facility-administered medications for this visit.     Physical Exam: Vitals:   03/08/18 1532  BP: 133/81  Pulse: (!) 110  SpO2: 96%  Weight: 269 lb (122 kg)  Height: _0  (1.676 m)    GEN- The patient is well appearing, alert and oriented x 3 today.   Head- normocephalic, atraumatic Eyes-  Sclera clear, conjunctiva pink Ears- hearing intact Oropharynx- clear Lungs- Clear to ausculation bilaterally, normal work of breathing Heart- Regular rate and rhythm, no murmurs, rubs or gallops, PMI not laterally displaced GI- soft, NT, ND, + BS Extremities- no clubbing, cyanosis, or edema  EKG tracing ordered today is personally reviewed and shows afib with RVR  Assessment and  Plan:  1. Persistent atrial fibrillation with RVR. Given lifestyle modification, I am much more hopeful that we can make positive impact on her afib. Therapeutic strategies for afib including medicine and ablation were discussed in detail with the patient today. Risk, benefits, and alternatives to EP study and radiofrequency ablation for afib were also discussed in detail today. These risks include but are not limited to stroke, bleeding, vascular damage, tamponade, perforation, damage to the esophagus, lungs, and other structures, pulmonary vein stenosis, worsening renal function, and death. The patient understands these risk and wishes to proceed.  We will therefore proceed with catheter ablation at the next available time.  She will require cardiac CT prior to ablation to exclude LAA thrombus.  Will request Carto, ICE, and anesthesia for this procedure  2. Nonischemic CM Previously due to tachycardia Has improved with rate control, though I worry about recurrence given difficult to control V rates Would consider ablation as above  3. HTN Stable No change required today  4. Obesity Body mass index is 43.42 kg/m. I am very pleased with her lifestyle change.  30 lbs lost so far! Continue current strategy   Thompson Grayer MD, [facility] 03/08/2018 4:08 PM

## 2018-03-08 NOTE — Patient Instructions (Signed)
Medication Instructions:  Continue all current medications.  Labwork: none  Testing/Procedures: none  Follow-Up: To be determined   Any Other Special Instructions Will Be Listed Below (If Applicable). Jenni from [facility] office will call to set up AFib ablation.   If you need a refill on your cardiac medications before your next appointment, please call your pharmacy.

## 2018-03-11 DIAGNOSIS — D696 Thrombocytopenia, unspecified: Secondary | ICD-10-CM | POA: Diagnosis not present

## 2018-03-11 DIAGNOSIS — E039 Hypothyroidism, unspecified: Secondary | ICD-10-CM | POA: Diagnosis not present

## 2018-03-11 DIAGNOSIS — E119 Type 2 diabetes mellitus without complications: Secondary | ICD-10-CM | POA: Diagnosis not present

## 2018-03-11 DIAGNOSIS — Z780 Asymptomatic menopausal state: Secondary | ICD-10-CM | POA: Diagnosis not present

## 2018-03-11 DIAGNOSIS — E2839 Other primary ovarian failure: Secondary | ICD-10-CM | POA: Diagnosis not present

## 2018-03-12 ENCOUNTER — Telehealth: Payer: Self-pay | Admitting: Internal Medicine

## 2018-03-12 DIAGNOSIS — I4819 Other persistent atrial fibrillation: Secondary | ICD-10-CM

## 2018-03-12 NOTE — Telephone Encounter (Signed)
Call placed to Pt.  Pt would like afib ablation on March 26, 2018 at 7:30 am.  Pt will have lab work drawn in [place] and pick up instruction letters there. Cardiac CT and ablation will be at [facility], scheduling to call for cardiac CT.   Will create instruction letter.  Pt indicates understanding of all information.  Pt to pick up instruction letter and get labs this week.

## 2018-03-12 NOTE — Telephone Encounter (Signed)
Calling to see when ablation is schedule for

## 2018-03-12 NOTE — Telephone Encounter (Signed)
-----   Message from Thompson Grayer, MD sent at 03/08/2018  4:34 PM EDT ----- Please call and schedule afib ablation on Monday. Cardiac CT prior  See my note 03/08/18

## 2018-03-14 ENCOUNTER — Telehealth: Payer: Self-pay | Admitting: *Deleted

## 2018-03-14 ENCOUNTER — Other Ambulatory Visit: Payer: Self-pay | Admitting: Internal Medicine

## 2018-03-14 DIAGNOSIS — I481 Persistent atrial fibrillation: Secondary | ICD-10-CM | POA: Diagnosis not present

## 2018-03-14 LAB — CBC WITH DIFFERENTIAL/PLATELET
BASOS ABS: 30 {cells}/uL (ref 0–200)
Basophils Relative: 0.5 %
EOS PCT: 6 %
Eosinophils Absolute: 360 cells/uL (ref 15–500)
HEMATOCRIT: 35.7 % (ref 35.0–45.0)
HEMOGLOBIN: 11.5 g/dL — AB (ref 11.7–15.5)
LYMPHS ABS: 948 {cells}/uL (ref 850–3900)
MCH: 25.9 pg — ABNORMAL LOW (ref 27.0–33.0)
MCHC: 32.2 g/dL (ref 32.0–36.0)
MCV: 80.4 fL (ref 80.0–100.0)
MPV: 12.3 fL (ref 7.5–12.5)
Monocytes Relative: 8.1 %
NEUTROS ABS: 4176 {cells}/uL (ref 1500–7800)
Neutrophils Relative %: 69.6 %
Platelets: 116 10*3/uL — ABNORMAL LOW (ref 140–400)
RBC: 4.44 10*6/uL (ref 3.80–5.10)
RDW: 15.1 % — ABNORMAL HIGH (ref 11.0–15.0)
Total Lymphocyte: 15.8 %
WBC mixed population: 486 cells/uL (ref 200–950)
WBC: 6 10*3/uL (ref 3.8–10.8)

## 2018-03-14 LAB — BASIC METABOLIC PANEL WITH GFR
BUN / CREAT RATIO: 19 (calc) (ref 6–22)
BUN: 25 mg/dL (ref 7–25)
CALCIUM: 8.8 mg/dL (ref 8.6–10.4)
CHLORIDE: 107 mmol/L (ref 98–110)
CO2: 25 mmol/L (ref 20–32)
Creat: 1.35 mg/dL — ABNORMAL HIGH (ref 0.50–1.05)
GFR, EST AFRICAN AMERICAN: 50 mL/min/{1.73_m2} — AB (ref >=60)
GFR, EST NON AFRICAN AMERICAN: 43 mL/min/{1.73_m2} — AB (ref >=60)
Glucose, Bld: 135 mg/dL — ABNORMAL HIGH (ref 65–99)
Potassium: 4 mmol/L (ref 3.5–5.3)
Sodium: 139 mmol/L (ref 135–146)

## 2018-03-14 NOTE — Addendum Note (Signed)
Addended by: Laurine Blazer on: 03/14/2018 10:08 AM   Modules accepted: Orders

## 2018-03-14 NOTE — Telephone Encounter (Signed)
Requesting  A faxed copy of labs needed for patient  Upcoming patient 's ablation. Informed rep -  Lab request does not have  sku code . Rep stated to fax form. -- done  [phone]

## 2018-03-18 ENCOUNTER — Other Ambulatory Visit: Payer: Self-pay | Admitting: Cardiovascular Disease

## 2018-03-22 ENCOUNTER — Ambulatory Visit (HOSPITAL_COMMUNITY): Payer: BLUE CROSS/BLUE SHIELD

## 2018-03-22 ENCOUNTER — Ambulatory Visit (HOSPITAL_COMMUNITY)
Admission: RE | Admit: 2018-03-22 | Discharge: 2018-03-22 | Disposition: A | Discharge status: Home / Self Care | Payer: BLUE CROSS/BLUE SHIELD | Source: Ambulatory Visit | Attending: Internal Medicine | Admitting: Internal Medicine

## 2018-03-22 DIAGNOSIS — I517 Cardiomegaly: Secondary | ICD-10-CM | POA: Diagnosis not present

## 2018-03-22 DIAGNOSIS — I288 Other diseases of pulmonary vessels: Secondary | ICD-10-CM | POA: Insufficient documentation

## 2018-03-22 DIAGNOSIS — K449 Diaphragmatic hernia without obstruction or gangrene: Secondary | ICD-10-CM | POA: Insufficient documentation

## 2018-03-22 DIAGNOSIS — I4891 Unspecified atrial fibrillation: Secondary | ICD-10-CM | POA: Diagnosis not present

## 2018-03-22 DIAGNOSIS — I481 Persistent atrial fibrillation: Secondary | ICD-10-CM | POA: Insufficient documentation

## 2018-03-22 DIAGNOSIS — I4819 Other persistent atrial fibrillation: Secondary | ICD-10-CM

## 2018-03-22 LAB — POCT I-STAT CREATININE: CREATININE: 1.4 mg/dL — AB (ref 0.44–1.00)

## 2018-03-22 MED ORDER — METOPROLOL TARTRATE 5 MG/5ML IV SOLN
INTRAVENOUS | Status: AC
  Administered 2018-03-22: 5 mg via INTRAVENOUS
  Filled 2018-03-22: qty 20

## 2018-03-22 MED ORDER — METOPROLOL TARTRATE 5 MG/5ML IV SOLN
5.0000 mg | INTRAVENOUS | Status: AC | PRN
  Administered 2018-03-22 (×4): 5 mg via INTRAVENOUS

## 2018-03-22 MED ORDER — IOPAMIDOL (ISOVUE-370) INJECTION 76%
100.0000 mL | Freq: Once | INTRAVENOUS | Status: AC | PRN
  Administered 2018-03-22: 100 mL via INTRAVENOUS

## 2018-03-22 MED ORDER — IOPAMIDOL (ISOVUE-370) INJECTION 76%
INTRAVENOUS | Status: AC
  Filled 2018-03-22: qty 100

## 2018-03-25 ENCOUNTER — Encounter (HOSPITAL_COMMUNITY): Payer: Self-pay | Admitting: Certified Registered Nurse Anesthetist

## 2018-03-26 ENCOUNTER — Encounter (HOSPITAL_COMMUNITY): Payer: Self-pay | Admitting: *Deleted

## 2018-03-26 ENCOUNTER — Ambulatory Visit (HOSPITAL_COMMUNITY): Payer: BLUE CROSS/BLUE SHIELD | Admitting: Certified Registered Nurse Anesthetist

## 2018-03-26 ENCOUNTER — Ambulatory Visit (HOSPITAL_COMMUNITY)
Admission: RE | Admit: 2018-03-26 | Discharge: 2018-03-27 | Disposition: A | Discharge status: Home / Self Care | Payer: BLUE CROSS/BLUE SHIELD | Source: Ambulatory Visit | Attending: Internal Medicine | Admitting: Internal Medicine

## 2018-03-26 ENCOUNTER — Other Ambulatory Visit: Payer: Self-pay

## 2018-03-26 ENCOUNTER — Encounter (HOSPITAL_COMMUNITY)
Admission: RE | Disposition: A | Discharge status: Home / Self Care | Payer: Self-pay | Source: Ambulatory Visit | Attending: Internal Medicine

## 2018-03-26 DIAGNOSIS — E039 Hypothyroidism, unspecified: Secondary | ICD-10-CM | POA: Diagnosis not present

## 2018-03-26 DIAGNOSIS — I4892 Unspecified atrial flutter: Secondary | ICD-10-CM | POA: Insufficient documentation

## 2018-03-26 DIAGNOSIS — Z7901 Long term (current) use of anticoagulants: Secondary | ICD-10-CM | POA: Diagnosis not present

## 2018-03-26 DIAGNOSIS — I428 Other cardiomyopathies: Secondary | ICD-10-CM | POA: Insufficient documentation

## 2018-03-26 DIAGNOSIS — I4891 Unspecified atrial fibrillation: Secondary | ICD-10-CM | POA: Diagnosis not present

## 2018-03-26 DIAGNOSIS — I509 Heart failure, unspecified: Secondary | ICD-10-CM | POA: Insufficient documentation

## 2018-03-26 DIAGNOSIS — I481 Persistent atrial fibrillation: Secondary | ICD-10-CM | POA: Insufficient documentation

## 2018-03-26 DIAGNOSIS — I110 Hypertensive heart disease with heart failure: Secondary | ICD-10-CM | POA: Insufficient documentation

## 2018-03-26 DIAGNOSIS — Z6841 Body Mass Index (BMI) 40.0 and over, adult: Secondary | ICD-10-CM | POA: Diagnosis not present

## 2018-03-26 DIAGNOSIS — I4819 Other persistent atrial fibrillation: Secondary | ICD-10-CM | POA: Diagnosis present

## 2018-03-26 DIAGNOSIS — E119 Type 2 diabetes mellitus without complications: Secondary | ICD-10-CM | POA: Diagnosis not present

## 2018-03-26 HISTORY — DX: Type 2 diabetes mellitus without complications: E11.9

## 2018-03-26 HISTORY — PX: ATRIAL FIBRILLATION ABLATION: EP1191

## 2018-03-26 LAB — GLUCOSE, CAPILLARY
GLUCOSE-CAPILLARY: 124 mg/dL — AB (ref 65–99)
GLUCOSE-CAPILLARY: 151 mg/dL — AB (ref 65–99)
GLUCOSE-CAPILLARY: 160 mg/dL — AB (ref 65–99)
Glucose-Capillary: 197 mg/dL — ABNORMAL HIGH (ref 65–99)

## 2018-03-26 LAB — POCT ACTIVATED CLOTTING TIME
ACTIVATED CLOTTING TIME: 197 s
Activated Clotting Time: 307 seconds
Activated Clotting Time: 312 seconds
Activated Clotting Time: 340 seconds
Activated Clotting Time: 362 seconds
Activated Clotting Time: 230 seconds
Activated Clotting Time: 208 seconds

## 2018-03-26 SURGERY — ATRIAL FIBRILLATION ABLATION
Anesthesia: General

## 2018-03-26 MED ORDER — DEXAMETHASONE SODIUM PHOSPHATE 10 MG/ML IJ SOLN
INTRAMUSCULAR | Status: DC | PRN
  Administered 2018-03-26: 5 mg via INTRAVENOUS

## 2018-03-26 MED ORDER — THYROID 60 MG PO TABS
60.0000 mg | ORAL_TABLET | Freq: Every day | ORAL | Status: DC
  Administered 2018-03-27: 60 mg via ORAL
  Filled 2018-03-26: qty 1

## 2018-03-26 MED ORDER — ONDANSETRON HCL 4 MG/2ML IJ SOLN
INTRAMUSCULAR | Status: DC | PRN
  Administered 2018-03-26: 4 mg via INTRAVENOUS

## 2018-03-26 MED ORDER — FUROSEMIDE 20 MG PO TABS
20.0000 mg | ORAL_TABLET | Freq: Every day | ORAL | Status: DC
  Administered 2018-03-27: 20 mg via ORAL
  Filled 2018-03-26: qty 1

## 2018-03-26 MED ORDER — HEPARIN SODIUM (PORCINE) 1000 UNIT/ML IJ SOLN
INTRAMUSCULAR | Status: AC
  Filled 2018-03-26: qty 1

## 2018-03-26 MED ORDER — RIVAROXABAN 20 MG PO TABS
20.0000 mg | ORAL_TABLET | Freq: Every day | ORAL | Status: DC
  Administered 2018-03-26: 20 mg via ORAL
  Filled 2018-03-26: qty 1

## 2018-03-26 MED ORDER — CARVEDILOL 12.5 MG PO TABS
12.5000 mg | ORAL_TABLET | Freq: Two times a day (BID) | ORAL | Status: DC
  Administered 2018-03-26 – 2018-03-27 (×2): 12.5 mg via ORAL
  Filled 2018-03-26 (×2): qty 1

## 2018-03-26 MED ORDER — ROCURONIUM BROMIDE 100 MG/10ML IV SOLN
INTRAVENOUS | Status: DC | PRN
  Administered 2018-03-26: 10 mg via INTRAVENOUS
  Administered 2018-03-26: 40 mg via INTRAVENOUS
  Administered 2018-03-26: 20 mg via INTRAVENOUS
  Administered 2018-03-26 (×3): 10 mg via INTRAVENOUS

## 2018-03-26 MED ORDER — LIDOCAINE HCL (CARDIAC) 20 MG/ML IV SOLN
INTRAVENOUS | Status: DC | PRN
  Administered 2018-03-26: 80 mg via INTRAVENOUS

## 2018-03-26 MED ORDER — SODIUM CHLORIDE 0.9% FLUSH
3.0000 mL | Freq: Two times a day (BID) | INTRAVENOUS | Status: DC
  Administered 2018-03-26: 3 mL via INTRAVENOUS

## 2018-03-26 MED ORDER — POTASSIUM CHLORIDE CRYS ER 10 MEQ PO TBCR
10.0000 meq | EXTENDED_RELEASE_TABLET | Freq: Every day | ORAL | Status: DC
  Administered 2018-03-27: 10 meq via ORAL
  Filled 2018-03-26 (×2): qty 1

## 2018-03-26 MED ORDER — SODIUM CHLORIDE 0.9% FLUSH: 3.0000 mL | INTRAVENOUS | Status: DC | PRN

## 2018-03-26 MED ORDER — FENTANYL CITRATE (PF) 100 MCG/2ML IJ SOLN
INTRAMUSCULAR | Status: DC | PRN
  Administered 2018-03-26 (×2): 50 ug via INTRAVENOUS

## 2018-03-26 MED ORDER — HEPARIN SODIUM (PORCINE) 1000 UNIT/ML IJ SOLN
INTRAMUSCULAR | Status: DC | PRN
  Administered 2018-03-26: 3000 [IU] via INTRAVENOUS
  Administered 2018-03-26: 2000 [IU] via INTRAVENOUS
  Administered 2018-03-26: 3000 [IU] via INTRAVENOUS

## 2018-03-26 MED ORDER — HEPARIN SODIUM (PORCINE) 1000 UNIT/ML IJ SOLN
INTRAMUSCULAR | Status: DC | PRN
  Administered 2018-03-26 (×2): 1000 [IU] via INTRAVENOUS
  Administered 2018-03-26: 12000 [IU] via INTRAVENOUS

## 2018-03-26 MED ORDER — BUPIVACAINE HCL (PF) 0.25 % IJ SOLN
INTRAMUSCULAR | Status: DC | PRN
  Administered 2018-03-26: 25 mL

## 2018-03-26 MED ORDER — HYDROCODONE-ACETAMINOPHEN 5-325 MG PO TABS: 1.0000 | ORAL_TABLET | ORAL | Status: DC | PRN

## 2018-03-26 MED ORDER — IOPAMIDOL (ISOVUE-370) INJECTION 76%
INTRAVENOUS | Status: AC
  Filled 2018-03-26: qty 50

## 2018-03-26 MED ORDER — HEPARIN (PORCINE) IN NACL 2-0.9 UNIT/ML-% IJ SOLN
INTRAMUSCULAR | Status: AC | PRN
  Administered 2018-03-26: 500 mL

## 2018-03-26 MED ORDER — HEPARIN (PORCINE) IN NACL 2-0.9 UNIT/ML-% IJ SOLN
INTRAMUSCULAR | Status: AC
  Filled 2018-03-26: qty 500

## 2018-03-26 MED ORDER — SUGAMMADEX SODIUM 200 MG/2ML IV SOLN
INTRAVENOUS | Status: DC | PRN
  Administered 2018-03-26: 250 mg via INTRAVENOUS

## 2018-03-26 MED ORDER — SODIUM CHLORIDE 0.9 % IV SOLN
INTRAVENOUS | Status: DC
  Administered 2018-03-26 (×2): via INTRAVENOUS

## 2018-03-26 MED ORDER — ONDANSETRON HCL 4 MG/2ML IJ SOLN: 4.0000 mg | Freq: Four times a day (QID) | INTRAMUSCULAR | Status: DC | PRN

## 2018-03-26 MED ORDER — PROTAMINE SULFATE 10 MG/ML IV SOLN
INTRAVENOUS | Status: DC | PRN
  Administered 2018-03-26: 20 mg via INTRAVENOUS
  Administered 2018-03-26: 10 mg via INTRAVENOUS

## 2018-03-26 MED ORDER — MIDAZOLAM HCL 5 MG/5ML IJ SOLN
INTRAMUSCULAR | Status: DC | PRN
  Administered 2018-03-26 (×2): 1 mg via INTRAVENOUS

## 2018-03-26 MED ORDER — PHENYLEPHRINE HCL 10 MG/ML IJ SOLN
INTRAVENOUS | Status: DC | PRN
  Administered 2018-03-26: 20 ug/min via INTRAVENOUS

## 2018-03-26 MED ORDER — ACETAMINOPHEN 325 MG PO TABS
650.0000 mg | ORAL_TABLET | ORAL | Status: DC | PRN
  Administered 2018-03-26: 650 mg via ORAL
  Filled 2018-03-26: qty 2

## 2018-03-26 MED ORDER — PROPOFOL 10 MG/ML IV BOLUS
INTRAVENOUS | Status: DC | PRN
  Administered 2018-03-26: 50 mg via INTRAVENOUS
  Administered 2018-03-26: 150 mg via INTRAVENOUS

## 2018-03-26 MED ORDER — BUPIVACAINE HCL (PF) 0.25 % IJ SOLN
INTRAMUSCULAR | Status: AC
  Filled 2018-03-26: qty 30

## 2018-03-26 MED ORDER — IOPAMIDOL (ISOVUE-370) INJECTION 76%
INTRAVENOUS | Status: DC | PRN
  Administered 2018-03-26: 6 mL via INTRAVENOUS

## 2018-03-26 MED ORDER — SODIUM CHLORIDE 0.9 % IV SOLN: 250.0000 mL | INTRAVENOUS | Status: DC | PRN

## 2018-03-26 SURGICAL SUPPLY — 19 items
BLANKET WARM UNDERBOD FULL ACC (MISCELLANEOUS) ×2 IMPLANT
CATH MAPPNG PENTARAY F 2-6-2MM (CATHETERS) ×1 IMPLANT
CATH NAVISTAR SMARTTOUCH DF (ABLATOR) ×2 IMPLANT
CATH SOUNDSTAR 3D IMAGING (CATHETERS) ×2 IMPLANT
CATH WEBSTER BI DIR CS D-F CRV (CATHETERS) ×2 IMPLANT
COVER SWIFTLINK CONNECTOR (BAG) ×2 IMPLANT
HOVERMATT SINGLE USE (MISCELLANEOUS) ×2 IMPLANT
NEEDLE TRANSEP BRK 71CM 407200 (NEEDLE) ×2 IMPLANT
PACK EP LATEX FREE (CUSTOM PROCEDURE TRAY) ×1
PACK EP LF (CUSTOM PROCEDURE TRAY) ×1 IMPLANT
PAD DEFIB LIFELINK (PAD) ×2 IMPLANT
PATCH CARTO3 (PAD) ×2 IMPLANT
PENTARAY F 2-6-2MM (CATHETERS) ×2
SHEATH AVANTI 11CM 7FR (SHEATH) ×4 IMPLANT
SHEATH AVANTI 11CM 9FR (SHEATH) ×2 IMPLANT
SHEATH AVANTI 11F 11CM (SHEATH) ×2 IMPLANT
SHEATH SWARTZ TS SL2 63CM 8.5F (SHEATH) ×2 IMPLANT
TUBING SMART ABLATE COOLFLOW (TUBING) ×2 IMPLANT
WIRE GUIDERIGHT .032X180 (WIRE) ×2 IMPLANT

## 2018-03-26 NOTE — Anesthesia Postprocedure Evaluation (Signed)
Anesthesia Post Note  Patient: Isabella Hall  Procedure(s) Performed: ATRIAL FIBRILLATION ABLATION (N/A )     Patient location during evaluation: Cath Lab Anesthesia Type: General Level of consciousness: awake and sedated Pain management: pain level controlled Vital Signs Assessment: post-procedure vital signs reviewed and stable Respiratory status: spontaneous breathing, nonlabored ventilation, respiratory function stable and patient connected to nasal cannula oxygen Cardiovascular status: blood pressure returned to baseline and stable Postop Assessment: no apparent nausea or vomiting Anesthetic complications: no    Last Vitals:  Vitals:   03/26/18 1315 03/26/18 1320  BP:    Pulse: 66 71  Resp: 14 (!) 21  Temp:    SpO2: 99% 98%    Last Pain:  Vitals:   03/26/18 1210  TempSrc: Temporal  PainSc:                  Sharnetta Gielow,JAMES TERRILL

## 2018-03-26 NOTE — Discharge Summary (Addendum)
  ELECTROPHYSIOLOGY PROCEDURE DISCHARGE SUMMARY    Patient ID: Isabella Hall,  MRN: [id number], DOB/AGE: 06/10/1956 62 y.o.  Admit date: 03/26/2018 Discharge date: 03/27/18  Primary Care Physician: Gosrani, Nimish C, MD  Primary Cardiologist: Dr. Kpeswaran Electrophysiologist: James Allred, MD  Primary Discharge Diagnosis:  1. Persistent AFib     CHA2DS2Vasc is 4, on Xarelto  Secondary Discharge Diagnosis:  1. HTN 2. Hx of tachy-mediated CM 3. DM 4. Hypothyroidism  Procedures This Admission:  1.  Electrophysiology study and radiofrequency catheter ablation on 03/26/18 by Dr James Allred.   This study demonstrated   CONCLUSIONS: 1. Atrial fibrillation upon presentation.   2. Intracardiac echo reveals severe biatrial enlargement with four separate pulmonary veins without evidence of pulmonary vein stenosis. 3. Successful electrical isolation and anatomical encircling of all four pulmonary veins with radiofrequency current.  A WACA approach was used 3. Additional left atrial ablation was performed with a standard box lesion created along the posterior wall of the left atrium 4. Atrial fibrillation successfully cardioverted to sinus rhythm. 5. No early apparent complications.     Brief HPI: Isabella Hall is a 62 y.o. female with a history of persistent atrial fibrillation.  Risks, benefits, and alternatives to catheter ablation of atrial fibrillation were reviewed with the patient who wished to proceed.  The patient underwent cardiac CT prior to the procedure which demonstrated no LAA thrombus.    Hospital Course:  The patient was admitted and underwent EPS/RFCA of atrial fibrillation with details as outlined above.  She was monitored on telemetry overnight which demonstrated SR.  R groin was without complication on the day of discharge.  The patient feels well, no CP or SOB, she was examined by Dr. Allred and considered to be stable for discharge.  Wound care and restrictions  were reviewed with the patient.  The patient will be seen back by Donna Carroll, NP in 4 weeks and Dr Allred in 12 weeks for post ablation follow up.    Physical Exam: Vitals:   03/26/18 1601 03/26/18 2001 03/27/18 0428 03/27/18 0807  BP: (!) 134/103 (!) 135/106 (!) 139/98 134/89  Pulse: 69 75 70   Resp: 13 18 15 (!) 21  Temp: 97.8 F (36.6 C) 97.9 F (36.6 C) 98.3 F (36.8 C)   TempSrc: Oral Oral Oral   SpO2:  96% 97%   Weight:   278 lb (126.1 kg)   Height:        GEN- The patient is well appearing, alert and oriented x 3 today.   HEENT: normocephalic, atraumatic; sclera clear, conjunctiva pink; hearing intact; oropharynx clear; neck supple  Lungs- CTA b/l, normal work of breathing.  No wheezes, rales, rhonchi Heart- RRR, no murmurs, rubs or gallops  GI- soft, non-tender, non-distended  Extremities- no clubbing, cyanosis, or edema; DP/PT 2+ ,  R groin is soft, non-tender, without hematoma/bruit MS- no significant deformity or atrophy Skin- warm and dry, no rash or lesion Psych- euthymic mood, full affect Neuro- strength and sensation are intact   Labs:   Lab Results  Component Value Date   WBC 6.0 03/14/2018   HGB 11.5 (L) 03/14/2018   HCT 35.7 03/14/2018   MCV 80.4 03/14/2018   PLT 116 (L) 03/14/2018    Recent Labs  Lab 03/22/18 0931  CREATININE 1.40*     Discharge Medications:  Allergies as of 03/27/2018   No Known Allergies     Medication List    TAKE these medications   blood   glucose meter kit and supplies Kit Dispense based on patient and insurance preference. Use up to four times daily as directed. (FOR ICD-9 250.00, 250.01).   carvedilol 25 MG tablet Commonly known as:  COREG TAKE 1 & 1/2 (ONE & ONE-HALF) TABLETS BY MOUTH TWICE DAILY   CINNAMON PO Take 1 capsule by mouth daily.   diltiazem 240 MG 24 hr capsule Commonly known as:  CARDIZEM CD TAKE ONE CAPSULE BY MOUTH TWICE DAILY   estradiol 0.5 MG tablet Commonly known as:  ESTRACE Take  0.5 mg by mouth daily.   furosemide 20 MG tablet Commonly known as:  LASIX TAKE ONE TABLET BY MOUTH ONCE DAILY   GARLIC PO Take 1 tablet by mouth daily.   guaiFENesin 600 MG 12 hr tablet Commonly known as:  MUCINEX Take 1 tablet (600 mg total) by mouth 2 (two) times daily.   multivitamin with minerals Tabs tablet Take 1 tablet by mouth daily.   pantoprazole 40 MG tablet Commonly known as:  PROTONIX Take 1 tablet (40 mg total) by mouth daily.   potassium chloride 10 MEQ tablet Commonly known as:  K-DUR Take 1 tablet (10 mEq total) by mouth daily.   progesterone 100 MG capsule Commonly known as:  PROMETRIUM Take 100 mg by mouth daily.   sodium chloride 0.65 % nasal spray Commonly known as:  OCEAN Place 1 spray into both nostrils 3 (three) times daily as needed. Uses daily, up to three times per day during allergy season   thyroid 60 MG tablet Commonly known as:  ARMOUR Take 60 mg by mouth daily before breakfast.   vitamin C 500 MG tablet Commonly known as:  ASCORBIC ACID Take 500 mg by mouth 2 (two) times daily.   vitamin E 400 UNIT capsule Take 400 Units by mouth daily.   XARELTO 20 MG Tabs tablet Generic drug:  rivaroxaban TAKE 1 TABLET BY MOUTH ONCE DAILY WITH SUPPER       Disposition: Home   Discharge Instructions    Diet - low sodium heart healthy   Complete by:  As directed    Increase activity slowly   Complete by:  As directed      Follow-up Information    [facility] Follow up on 04/29/2018.   Specialty:  Cardiology Why:  10:00AM Contact information: [id number] [place] [address] [phone]       Allred, James, MD Follow up on 06/26/2018.   Specialty:  Cardiology Why:  10:00AM Contact information: [address]  [place] [address] [phone]           Duration of Discharge Encounter: Greater than 30 minutes including physician  time.  Signed, Renee Ursuy, PA-C 03/27/2018 9:03 AM   I have seen, examined the patient, and reviewed the above assessment and plan.  Changes to above are made where necessary.  On exam, RRR.  Routine follow-up advised  Co Sign: James Allred, MD 03/27/2018 5:08 PM   

## 2018-03-26 NOTE — Progress Notes (Signed)
Venous sheaths removed by Burlene Arnt RTR.  Sheaths removed from right femoral vein intact. 15 min manual pressure applied Site level 0.  Dressed with 4x4 and Tegaderm dressing applied.  DP +2.  Post instructions given.  Bed rest begins at 1530 hrs.

## 2018-03-26 NOTE — Anesthesia Preprocedure Evaluation (Addendum)
Anesthesia Evaluation  Patient identified by MRN, date of birth, ID band Patient awake    Reviewed: Allergy & Precautions, NPO status , Patient's Chart, lab work & pertinent test results  Airway Mallampati: II  TM Distance: >3 FB Neck ROM: Full    Dental no notable dental hx.    Pulmonary shortness of breath,    breath sounds clear to auscultation       Cardiovascular hypertension, +CHF  + dysrhythmias (-) Valvular Problems/Murmurs Rhythm:Irregular Rate:Normal     Neuro/Psych    GI/Hepatic   Endo/Other  diabetesHypothyroidism Morbid obesity  Renal/GU Renal InsufficiencyRenal disease     Musculoskeletal   Abdominal (+) + obese,   Peds  Hematology   Anesthesia Other Findings   Reproductive/Obstetrics                             Anesthesia Physical Anesthesia Plan  ASA: III  Anesthesia Plan: General   Post-op Pain Management:    Induction: Intravenous  PONV Risk Score and Plan: 3 and Treatment may vary due to age or medical condition  Airway Management Planned: LMA  Additional Equipment:   Intra-op Plan:   Post-operative Plan: Extubation in OR  Informed Consent: I have reviewed the patients History and Physical, chart, labs and discussed the procedure including the risks, benefits and alternatives for the proposed anesthesia with the patient or authorized representative who has indicated his/her understanding and acceptance.   Dental advisory given  Plan Discussed with: CRNA  Anesthesia Plan Comments:        Anesthesia Quick Evaluation

## 2018-03-26 NOTE — Plan of Care (Signed)
  Problem: Activity: Goal: Ability to return to baseline activity level will improve Outcome: Completed/Met  Pt up ad lib. Ambulates independently with steady gait.

## 2018-03-26 NOTE — Anesthesia Procedure Notes (Signed)
Procedure Name: Intubation Date/Time: 03/26/2018 7:50 AM Performed by: Candis Shine, CRNA Pre-anesthesia Checklist: Patient identified, Emergency Drugs available, Suction available and Patient being monitored Patient Re-evaluated:Patient Re-evaluated prior to induction Oxygen Delivery Method: Circle System Utilized Preoxygenation: Pre-oxygenation with 100% oxygen Induction Type: IV induction Ventilation: Mask ventilation without difficulty Laryngoscope Size: Mac and 3 Grade View: Grade I Tube type: Oral Tube size: 7.0 mm Number of attempts: 1 Airway Equipment and Method: Stylet Placement Confirmation: ETT inserted through vocal cords under direct vision,  positive ETCO2 and breath sounds checked- equal and bilateral Secured at: 22 cm Tube secured with: Tape Dental Injury: Teeth and Oropharynx as per pre-operative assessment

## 2018-03-26 NOTE — Interval H&P Note (Signed)
History and Physical Interval Note:  03/26/2018 7:17 AM  Isabella Hall  has presented today for surgery, with the diagnosis of afib  The various methods of treatment have been discussed with the patient and family. After consideration of risks, benefits and other options for treatment, the patient has consented to  Procedure(s): ATRIAL FIBRILLATION ABLATION (N/A) as a surgical intervention .  The patient's history has been reviewed, patient examined, no change in status, stable for surgery.  I have reviewed the patient's chart and labs.  Questions were answered to the patient's satisfaction.    Pt reports compliance with xarelto without interruption.  Cardiac CT results reviewed with the patient today.  Thompson Grayer

## 2018-03-26 NOTE — Transfer of Care (Signed)
Immediate Anesthesia Transfer of Care Note  Patient: Isabella Hall  Procedure(s) Performed: ATRIAL FIBRILLATION ABLATION (N/A )  Patient Location: Cath Lab  Anesthesia Type:General  Level of Consciousness: awake, alert  and oriented  Airway & Oxygen Therapy: Patient Spontanous Breathing and Patient connected to nasal cannula oxygen  Post-op Assessment: Report given to RN and Post -op Vital signs reviewed and stable  Post vital signs: Reviewed and stable  Last Vitals:  Vitals Value Taken Time  BP    Temp    Pulse 63 03/26/2018 11:18 AM  Resp 18 03/26/2018 11:18 AM  SpO2 96 % 03/26/2018 11:18 AM  Vitals shown include unvalidated device data.  Last Pain:  Vitals:   03/26/18 6010  TempSrc:   PainSc: 0-No pain         Complications: No apparent anesthesia complications

## 2018-03-27 ENCOUNTER — Encounter (HOSPITAL_COMMUNITY): Payer: Self-pay | Admitting: Internal Medicine

## 2018-03-27 DIAGNOSIS — I4892 Unspecified atrial flutter: Secondary | ICD-10-CM | POA: Diagnosis not present

## 2018-03-27 DIAGNOSIS — I509 Heart failure, unspecified: Secondary | ICD-10-CM | POA: Diagnosis not present

## 2018-03-27 DIAGNOSIS — I428 Other cardiomyopathies: Secondary | ICD-10-CM | POA: Diagnosis not present

## 2018-03-27 DIAGNOSIS — E039 Hypothyroidism, unspecified: Secondary | ICD-10-CM | POA: Diagnosis not present

## 2018-03-27 DIAGNOSIS — I110 Hypertensive heart disease with heart failure: Secondary | ICD-10-CM | POA: Diagnosis not present

## 2018-03-27 DIAGNOSIS — I481 Persistent atrial fibrillation: Secondary | ICD-10-CM | POA: Diagnosis not present

## 2018-03-27 DIAGNOSIS — Z7901 Long term (current) use of anticoagulants: Secondary | ICD-10-CM | POA: Diagnosis not present

## 2018-03-27 DIAGNOSIS — E119 Type 2 diabetes mellitus without complications: Secondary | ICD-10-CM | POA: Diagnosis not present

## 2018-03-27 DIAGNOSIS — Z6841 Body Mass Index (BMI) 40.0 and over, adult: Secondary | ICD-10-CM | POA: Diagnosis not present

## 2018-03-27 LAB — GLUCOSE, CAPILLARY: GLUCOSE-CAPILLARY: 150 mg/dL — AB (ref 65–99)

## 2018-03-27 MED ORDER — PANTOPRAZOLE SODIUM 40 MG PO TBEC: 40.0000 mg | DELAYED_RELEASE_TABLET | Freq: Every day | ORAL | 0 refills | Status: DC

## 2018-03-27 NOTE — Discharge Instructions (Addendum)
Post procedure care instructions No driving for 4 days. No lifting over 5 lbs for 1 week. No vigorous or sexual activity for 1 week. You may return to work on 04/02/18. Keep procedure site clean & dry. If you notice increased pain, swelling, bleeding or pus, call/return!  You may shower, but no soaking baths/hot tubs/pools for 1 week.    You have an appointment set up with the [facility].  Multiple studies have shown that being followed by a dedicated atrial fibrillation clinic in addition to the standard care you receive from your other physicians improves health. We believe that enrollment in the atrial fibrillation clinic will allow [place] to better care for you.   The phone number to the [facility] is [phone]. The clinic is staffed Monday through Friday from 8:30am to 5pm.  Parking Directions: The clinic is located in the Heart and Vascular Building connected to [facility]. 1)From [address] turn on to [address] and go to the 3rd entrance  (Heart and Vascular entrance) on the right. 2)Look to the right for Heart &Vascular Parking Garage. 3)A code for the entrance is required please call the clinic to receive this.   4)Take the elevators to the 1st floor. Registration is in the room with the glass walls at the end of the hallway.  If you have any trouble parking or locating the clinic, please dont hesitate to call [phone].    Information on my medicine - XARELTO (Rivaroxaban)  This medication education was reviewed with me or my healthcare representative as part of my discharge preparation.  The pharmacist that spoke with me during my hospital stay was:  Saundra Shelling, [facility]  Why was Xarelto prescribed for you? Xarelto was prescribed for you to reduce the risk of a blood clot forming that can cause a stroke if you have a medical condition called atrial fibrillation (a type of irregular heartbeat).  What do you need to know about  xarelto ? Take your Xarelto ONCE DAILY at the same time every day with your evening meal. If you have difficulty swallowing the tablet whole, you may crush it and mix in applesauce just prior to taking your dose.  Take Xarelto exactly as prescribed by your doctor and DO NOT stop taking Xarelto without talking to the doctor who prescribed the medication.  Stopping without other stroke prevention medication to take the place of Xarelto may increase your risk of developing a clot that causes a stroke.  Refill your prescription before you run out.  After discharge, you should have regular check-up appointments with your healthcare provider that is prescribing your Xarelto.  In the future your dose may need to be changed if your kidney function or weight changes by a significant amount.  What do you do if you miss a dose? If you are taking Xarelto ONCE DAILY and you miss a dose, take it as soon as you remember on the same day then continue your regularly scheduled once daily regimen the next day. Do not take two doses of Xarelto at the same time or on the same day.   Important Safety Information A possible side effect of Xarelto is bleeding. You should call your healthcare provider right away if you experience any of the following: ? Bleeding from an injury or your nose that does not stop. ? Unusual colored urine (red or dark brown) or unusual colored stools (red or black). ? Unusual bruising for unknown reasons. ? A serious  fall or if you hit your head (even if there is no bleeding).  Some medicines may interact with Xarelto and might increase your risk of bleeding while on Xarelto. To help avoid this, consult your healthcare provider or pharmacist prior to using any new prescription or non-prescription medications, including herbals, vitamins, non-steroidal anti-inflammatory drugs (NSAIDs) and supplements.  This website has more information on Xarelto: [email or web address].[email or web address]

## 2018-03-27 NOTE — Progress Notes (Signed)
Patient received discharge information and acknowledged understanding of it. Patient IV was removed.  

## 2018-04-29 ENCOUNTER — Ambulatory Visit (HOSPITAL_COMMUNITY): Payer: BLUE CROSS/BLUE SHIELD | Admitting: Nurse Practitioner

## 2018-04-29 ENCOUNTER — Ambulatory Visit: Payer: BLUE CROSS/BLUE SHIELD | Admitting: Internal Medicine

## 2018-05-07 DIAGNOSIS — E2839 Other primary ovarian failure: Secondary | ICD-10-CM | POA: Diagnosis not present

## 2018-05-07 DIAGNOSIS — E119 Type 2 diabetes mellitus without complications: Secondary | ICD-10-CM | POA: Diagnosis not present

## 2018-05-07 DIAGNOSIS — E559 Vitamin D deficiency, unspecified: Secondary | ICD-10-CM | POA: Diagnosis not present

## 2018-05-07 DIAGNOSIS — E039 Hypothyroidism, unspecified: Secondary | ICD-10-CM | POA: Diagnosis not present

## 2018-05-21 ENCOUNTER — Ambulatory Visit (HOSPITAL_COMMUNITY): Payer: BLUE CROSS/BLUE SHIELD | Admitting: Nurse Practitioner

## 2018-05-23 ENCOUNTER — Ambulatory Visit (HOSPITAL_COMMUNITY): Payer: BLUE CROSS/BLUE SHIELD | Admitting: Nurse Practitioner

## 2018-06-10 ENCOUNTER — Encounter: Payer: Self-pay | Admitting: *Deleted

## 2018-06-11 ENCOUNTER — Ambulatory Visit (INDEPENDENT_AMBULATORY_CARE_PROVIDER_SITE_OTHER): Payer: BLUE CROSS/BLUE SHIELD | Admitting: Cardiovascular Disease

## 2018-06-11 ENCOUNTER — Encounter: Payer: Self-pay | Admitting: Cardiovascular Disease

## 2018-06-11 VITALS — BP 158/92 | HR 66 | Ht 66.0 in | Wt 262.0 lb

## 2018-06-11 DIAGNOSIS — I428 Other cardiomyopathies: Secondary | ICD-10-CM | POA: Diagnosis not present

## 2018-06-11 DIAGNOSIS — Z9889 Other specified postprocedural states: Secondary | ICD-10-CM | POA: Diagnosis not present

## 2018-06-11 DIAGNOSIS — I10 Essential (primary) hypertension: Secondary | ICD-10-CM | POA: Diagnosis not present

## 2018-06-11 DIAGNOSIS — Z8679 Personal history of other diseases of the circulatory system: Secondary | ICD-10-CM

## 2018-06-11 MED ORDER — DILTIAZEM HCL ER COATED BEADS 240 MG PO CP24: 240.0000 mg | ORAL_CAPSULE | Freq: Every day | ORAL | Status: DC

## 2018-06-11 NOTE — Patient Instructions (Signed)
Medication Instructions:   Decrease Diltiazem CD to 240mg  DAILY  Continue all other medications.    Labwork: none  Testing/Procedures: none  Follow-Up: 3 months   Any Other Special Instructions Will Be Listed Below (If Applicable).  If you need a refill on your cardiac medications before your next appointment, please call your pharmacy.

## 2018-06-11 NOTE — Progress Notes (Signed)
SUBJECTIVE: The patient presents for follow-up.  She underwent ablation of persistent atrial fibrillation on 03/27/2018 by Dr. [name].  She also has a history of tachycardia mediated cardiomyopathy.  ECG performed in the office today which I ordered and personally interpreted demonstrates normal sinus rhythm with no ischemic ST segment or T-wave abnormalities, nor any arrhythmias.  She has lost 16 pounds since the beginning of April and is down to 262 pounds.  She now walks 30 minutes daily and has completely changed her diet.  She eats high protein and low carbohydrates with reduced fat.  She denies chest pain, leg swelling, shortness of breath, and palpitations.  She had to buy a whole new wardrobe.      Review of Systems: As per "subjective", otherwise negative.  No Known Allergies  Current Outpatient Medications  Medication Sig Dispense Refill  . blood glucose meter kit and supplies KIT Dispense based on patient and insurance preference. Use up to four times daily as directed. (FOR ICD-9 250.00, 250.01). 1 each 0  . carvedilol (COREG) 25 MG tablet TAKE 1 & 1/2 (ONE & ONE-HALF) TABLETS BY MOUTH TWICE DAILY 90 tablet 3  . CINNAMON PO Take 1 capsule by mouth daily.    Marland Kitchen diltiazem (CARDIZEM CD) 240 MG 24 hr capsule TAKE ONE CAPSULE BY MOUTH TWICE DAILY 180 capsule 3  . estradiol (ESTRACE) 0.5 MG tablet Take 0.5 mg by mouth daily.    . furosemide (LASIX) 20 MG tablet TAKE ONE TABLET BY MOUTH ONCE DAILY 90 tablet 1  . GARLIC PO Take 1 tablet by mouth daily.    Marland Kitchen guaiFENesin (MUCINEX) 600 MG 12 hr tablet Take 1 tablet (600 mg total) by mouth 2 (two) times daily. 30 tablet 0  . Multiple Vitamin (MULTIVITAMIN WITH MINERALS) TABS tablet Take 1 tablet by mouth daily.    . potassium chloride (K-DUR) 10 MEQ tablet Take 1 tablet (10 mEq total) by mouth daily. 30 tablet 6  . progesterone (PROMETRIUM) 100 MG capsule Take 100 mg by mouth daily.    . sodium chloride (OCEAN) 0.65 % nasal spray  Place 1 spray into both nostrils 3 (three) times daily as needed. Uses daily, up to three times per day during allergy season    . thyroid (ARMOUR) 60 MG tablet Take 60 mg by mouth daily before breakfast.    . vitamin C (ASCORBIC ACID) 500 MG tablet Take 500 mg by mouth 2 (two) times daily.    . vitamin E 400 UNIT capsule Take 400 Units by mouth daily.    Alveda Reasons 20 MG TABS tablet TAKE 1 TABLET BY MOUTH ONCE DAILY WITH SUPPER 90 tablet 3  . pantoprazole (PROTONIX) 40 MG tablet Take 1 tablet (40 mg total) by mouth daily. 45 tablet 0   No current facility-administered medications for this visit.     Past Medical History:  Diagnosis Date  . Atrial fibrillation (HCC)     CHADSVASC score 4  . Atrial flutter ([place])   . Essential hypertension   . Hypothyroidism   . Obesity   . Type 2 diabetes, diet controlled ([place])     Past Surgical History:  Procedure Laterality Date  . APPENDECTOMY  1979  . ATRIAL FIBRILLATION ABLATION  03/26/2018  . ATRIAL FIBRILLATION ABLATION N/A 03/26/2018   Procedure: ATRIAL FIBRILLATION ABLATION;  Surgeon: Thompson Grayer, MD;  Location: [facility];  Service: Cardiovascular;  Laterality: N/A;  . CARDIOVERSION N/A 01/25/2015   Procedure: CARDIOVERSION;  Surgeon: Satira Sark, MD;  Location: [facility];  Service: Cardiovascular;  Laterality: N/A;  . CHOLECYSTECTOMY OPEN  1979  . LEFT HEART CATHETERIZATION WITH CORONARY ANGIOGRAM N/A 01/27/2015   Procedure: LEFT HEART CATHETERIZATION WITH CORONARY ANGIOGRAM;  Surgeon: Sinclair Grooms, MD;  Location: [facility] CATH LAB;  Service: Cardiovascular;  Laterality: N/A;  . TEE WITHOUT CARDIOVERSION N/A 01/25/2015   Procedure: TRANSESOPHAGEAL ECHOCARDIOGRAM (TEE);  Surgeon: Satira Sark, MD;  Location: [facility];  Service: Cardiovascular;  Laterality: N/A;  . [place] History   Socioeconomic History  . Marital status: Divorced    Spouse name: Not on file  . Number of children: Not on file  . Years  of education: Not on file  . Highest education level: Not on file  Occupational History  . 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 Financial resource strain: Not on file  . Food insecurity:    Worry: Not on file    Inability: Not on file  . Transportation needs:    Medical: Not on file    Non-medical: Not on file  Tobacco Use  . Smoking status: Never Smoker  . Smokeless tobacco: Never Used  Substance and Sexual Activity  . Alcohol use: No    Alcohol/week: 0.0 oz  . Drug use: No  . Sexual activity: Not on file  Lifestyle  . Physical activity:    Days per week: Not on file    Minutes per session: Not on file  . Stress: Not on file  Relationships  . Social connections:    Talks on phone: Not on file    Gets together: Not on file    Attends religious service: Not on file    Active member of club or organization: Not on file    Attends meetings of clubs or organizations: Not on file    Relationship status: Not on file  . Intimate partner violence:    Fear of current or ex partner: Not on file    Emotionally abused: Not on file    Physically abused: Not on file    Forced sexual activity: Not on file  Other Topics Concern  . Not on file  Social History Narrative  . Not on file     Vitals:   06/11/18 1519  BP: (!) 158/92  Pulse: 66  SpO2: 97%  Weight: 262 lb (118.8 kg)  Height: _0  (1.676 m)    Wt Readings from Last 3 Encounters:  06/11/18 262 lb (118.8 kg)  03/27/18 278 lb (126.1 kg)  03/08/18 269 lb (122 kg)     PHYSICAL EXAM General: NAD HEENT: Normal. Neck: No JVD, no thyromegaly. Lungs: Clear to auscultation bilaterally with normal respiratory effort. CV: Regular rate and rhythm, normal S1/S2, no S3/S4, no murmur. No pretibial or periankle edema.  No carotid bruit.   Abdomen: Soft, nontender, no distention.  Neurologic: Alert and oriented.  Psych: Normal affect. Skin: Normal. Musculoskeletal: No gross  deformities.    ECG: Most recent ECG reviewed.   Labs: Lab Results  Component Value Date/Time   K 4.0 03/14/2018 09:39 AM   BUN 25 03/14/2018 09:39 AM   CREATININE 1.40 (H) 03/22/2018 09:31 AM   CREATININE 1.35 (H) 03/14/2018 09:39 AM   ALT 25 08/08/2017 05:40 AM   TSH 2.021 08/07/2017 06:00 AM   TSH 1.323 01/20/2015 01:22 AM   HGB 11.5 (L) 03/14/2018 09:39 AM  Lipids: Lab Results  Component Value Date/Time   LDLCALC 90 01/21/2015 08:52 AM   CHOL 147 01/21/2015 08:52 AM   TRIG 61 01/21/2015 08:52 AM   HDL 45 01/21/2015 08:52 AM       ASSESSMENT AND PLAN: 1.  Atrial fibrillation status post ablation: Currently on Xarelto 20 mg daily.  She is in sinus rhythm.  She is on carvedilol twice daily and long-acting diltiazem twice daily.  I will reduce diltiazem to 240 mg daily.  I may reduce this further going forward.  I will keep an eye on her blood pressure.  2.  Tachycardia mediated cardiomyopathy: Left ventricular systolic function was low normal on 08/07/2017, LVEF 50%.  3.  Hypertension: Blood pressure is elevated today.  I will monitor to see if further antihypertensive titration is needed.  I am reducing diltiazem to 240 mg daily.  She continues to progress with weight loss.  4.  Morbid obesity: Weight is down 16 pounds since 03/27/2018.  She walks 30 minutes daily and has completely modified her diet as detailed above.  I congratulated her on her progress.    Disposition: Follow up 3 months   Kate Sable, M.D., F.A.C.C.

## 2018-06-17 ENCOUNTER — Encounter: Payer: Self-pay | Admitting: Internal Medicine

## 2018-06-26 ENCOUNTER — Encounter (INDEPENDENT_AMBULATORY_CARE_PROVIDER_SITE_OTHER): Payer: Self-pay

## 2018-06-26 ENCOUNTER — Ambulatory Visit: Payer: BLUE CROSS/BLUE SHIELD | Admitting: Internal Medicine

## 2018-06-26 ENCOUNTER — Encounter: Payer: Self-pay | Admitting: Internal Medicine

## 2018-06-26 VITALS — BP 124/80 | HR 67 | Ht 66.0 in | Wt 257.0 lb

## 2018-06-26 DIAGNOSIS — I481 Persistent atrial fibrillation: Secondary | ICD-10-CM | POA: Diagnosis not present

## 2018-06-26 DIAGNOSIS — I428 Other cardiomyopathies: Secondary | ICD-10-CM | POA: Diagnosis not present

## 2018-06-26 DIAGNOSIS — I10 Essential (primary) hypertension: Secondary | ICD-10-CM | POA: Diagnosis not present

## 2018-06-26 DIAGNOSIS — I4819 Other persistent atrial fibrillation: Secondary | ICD-10-CM

## 2018-06-26 NOTE — Progress Notes (Signed)
PCP: Doree Albee, MD Primary Cardiologist: Dr Lisabeth Register Isabella Hall is a 62 y.o. female who presents today for routine electrophysiology followup.  Since his recent afib ablation, the patient reports doing very well.  she denies procedure related complications and is pleased with the results of the procedure.  Today, she denies symptoms of palpitations, chest pain, shortness of breath,  lower extremity edema, dizziness, presyncope, or syncope.  The patient is otherwise without complaint today.   Past Medical History:  Diagnosis Date  . Atrial fibrillation (HCC)     CHADSVASC score 4  . Atrial flutter ([place])   . Essential hypertension   . Hypothyroidism   . Obesity   . Type 2 diabetes, diet controlled ([place])    Past Surgical History:  Procedure Laterality Date  . APPENDECTOMY  1979  . ATRIAL FIBRILLATION ABLATION  03/26/2018  . ATRIAL FIBRILLATION ABLATION N/A 03/26/2018   Procedure: ATRIAL FIBRILLATION ABLATION;  Surgeon: Thompson Grayer, MD;  Location: [facility];  Service: Cardiovascular;  Laterality: N/A;  . CARDIOVERSION N/A 01/25/2015   Procedure: CARDIOVERSION;  Surgeon: Satira Sark, MD;  Location: [facility];  Service: Cardiovascular;  Laterality: N/A;  . CHOLECYSTECTOMY OPEN  1979  . LEFT HEART CATHETERIZATION WITH CORONARY ANGIOGRAM N/A 01/27/2015   Procedure: LEFT HEART CATHETERIZATION WITH CORONARY ANGIOGRAM;  Surgeon: Sinclair Grooms, MD;  Location: [facility] CATH LAB;  Service: Cardiovascular;  Laterality: N/A;  . TEE WITHOUT CARDIOVERSION N/A 01/25/2015   Procedure: TRANSESOPHAGEAL ECHOCARDIOGRAM (TEE);  Surgeon: Satira Sark, MD;  Location: [facility];  Service: Cardiovascular;  Laterality: N/A;  . [place] are personally reviewed and negatives except as per HPI above  Current Outpatient Medications  Medication Sig Dispense Refill  . blood glucose meter kit and supplies KIT Dispense based on patient and insurance preference.  Use up to four times daily as directed. (FOR ICD-9 250.00, 250.01). 1 each 0  . carvedilol (COREG) 25 MG tablet TAKE 1 & 1/2 (ONE & ONE-HALF) TABLETS BY MOUTH TWICE DAILY 90 tablet 3  . CINNAMON PO Take 1 capsule by mouth daily.    Marland Kitchen diltiazem (CARDIZEM CD) 240 MG 24 hr capsule Take 1 capsule (240 mg total) by mouth daily.    Marland Kitchen estradiol (ESTRACE) 0.5 MG tablet Take 0.5 mg by mouth daily.    . furosemide (LASIX) 20 MG tablet TAKE ONE TABLET BY MOUTH ONCE DAILY 90 tablet 1  . GARLIC PO Take 1 tablet by mouth daily.    Marland Kitchen guaiFENesin (MUCINEX) 600 MG 12 hr tablet Take 1 tablet (600 mg total) by mouth 2 (two) times daily. 30 tablet 0  . Multiple Vitamin (MULTIVITAMIN WITH MINERALS) TABS tablet Take 1 tablet by mouth daily.    . potassium chloride (K-DUR) 10 MEQ tablet Take 1 tablet (10 mEq total) by mouth daily. 30 tablet 6  . progesterone (PROMETRIUM) 100 MG capsule Take 100 mg by mouth daily.    . sodium chloride (OCEAN) 0.65 % nasal spray Place 1 spray into both nostrils 3 (three) times daily as needed. Uses daily, up to three times per day during allergy season    . thyroid (ARMOUR) 60 MG tablet Take 60 mg by mouth daily before breakfast.    . vitamin C (ASCORBIC ACID) 500 MG tablet Take 500 mg by mouth 2 (two) times daily.    . vitamin E 400 UNIT capsule Take 400 Units by mouth daily.    Marland Kitchen  XARELTO 20 MG TABS tablet TAKE 1 TABLET BY MOUTH ONCE DAILY WITH SUPPER 90 tablet 3  . pantoprazole (PROTONIX) 40 MG tablet Take 1 tablet (40 mg total) by mouth daily. 45 tablet 0   No current facility-administered medications for this visit.     Physical Exam: Vitals:   06/26/18 1008  BP: 124/80  Pulse: 67  Weight: 257 lb (116.6 kg)  Height: _0  (1.676 m)    GEN- The patient is overweight appearing, alert and oriented x 3 today.   Head- normocephalic, atraumatic Eyes-  Sclera clear, conjunctiva pink Ears- hearing intact Oropharynx- clear Lungs- Clear to ausculation bilaterally, normal work of  breathing Heart- Regular rate and rhythm, no murmurs, rubs or gallops, PMI not laterally displaced GI- soft, NT, ND, + BS Extremities- no clubbing, cyanosis, or edema  EKG tracing ordered today is personally reviewed and shows sinus rhythm 67 bpm, PR 148 msec, QRS 86 msec, Qtc 452 msec  Assessment and Plan:  1. Persistent atrial fibrillation Doing well s/p ablation chads2vasc score is at least 4.  Continue xarelto  2. HTN Stable No change required today  3. Obesity Body mass index is 41.48 kg/m. Wt Readings from Last 3 Encounters:  06/26/18 257 lb (116.6 kg)  06/11/18 262 lb (118.8 kg)  03/27/18 278 lb (126.1 kg)   She has made amazing progress with lifestyle modification.  She has lost 95 lbs!  I am so proud of her!!  4. Nonischemic CM Resolved previously with sinus  Return to see me in 3 months in [facility] MD, [facility] 06/26/2018 10:31 AM

## 2018-06-26 NOTE — Patient Instructions (Addendum)
Medication Instructions:  Your physician recommends that you continue on your current medications as directed. Please refer to the Current Medication list given to you today.  Labwork: None ordered.  Testing/Procedures: None ordered.  Follow-Up: Your physician wants you to follow-up in: 3 months with Dr. [name] in [place].      Any Other Special Instructions Will Be Listed Below (If Applicable).  If you need a refill on your cardiac medications before your next appointment, please call your pharmacy.

## 2018-07-01 ENCOUNTER — Other Ambulatory Visit: Payer: Self-pay | Admitting: Cardiovascular Disease

## 2018-07-08 ENCOUNTER — Other Ambulatory Visit: Payer: Self-pay | Admitting: Cardiovascular Disease

## 2018-07-08 MED ORDER — RIVAROXABAN 20 MG PO TABS: ORAL_TABLET | ORAL | 3 refills | Status: DC

## 2018-07-08 NOTE — Telephone Encounter (Signed)
Patient states that she was told by Isac Caddy to contact office for reflll on XARELTO 20 MG TABS tablet

## 2018-07-31 ENCOUNTER — Other Ambulatory Visit: Payer: Self-pay | Admitting: Cardiovascular Disease

## 2018-07-31 MED ORDER — CARVEDILOL 25 MG PO TABS: ORAL_TABLET | ORAL | 1 refills | Status: DC

## 2018-07-31 NOTE — Telephone Encounter (Signed)
Medication sent to pharmacy  

## 2018-07-31 NOTE — Telephone Encounter (Signed)
° °  1. Which medications need to be refilled? (please list name of each medication and dose if known)    carvedilol (COREG) 25 MG tablet    2. Which pharmacy/location (including street and city if local pharmacy) is medication to be sent to?   [place]   3. Do they need a 30 day or 90 day supply?

## 2018-08-02 DIAGNOSIS — I4891 Unspecified atrial fibrillation: Secondary | ICD-10-CM | POA: Diagnosis not present

## 2018-08-02 DIAGNOSIS — N179 Acute kidney failure, unspecified: Secondary | ICD-10-CM | POA: Diagnosis not present

## 2018-08-02 DIAGNOSIS — Z7901 Long term (current) use of anticoagulants: Secondary | ICD-10-CM | POA: Diagnosis not present

## 2018-08-02 DIAGNOSIS — I10 Essential (primary) hypertension: Secondary | ICD-10-CM | POA: Diagnosis not present

## 2018-08-02 DIAGNOSIS — E039 Hypothyroidism, unspecified: Secondary | ICD-10-CM | POA: Diagnosis not present

## 2018-08-02 DIAGNOSIS — R000 Tachycardia, unspecified: Secondary | ICD-10-CM | POA: Diagnosis not present

## 2018-08-02 DIAGNOSIS — R7989 Other specified abnormal findings of blood chemistry: Secondary | ICD-10-CM | POA: Diagnosis not present

## 2018-08-02 DIAGNOSIS — R42 Dizziness and giddiness: Secondary | ICD-10-CM | POA: Diagnosis not present

## 2018-08-02 DIAGNOSIS — I4892 Unspecified atrial flutter: Secondary | ICD-10-CM | POA: Diagnosis not present

## 2018-08-02 DIAGNOSIS — R748 Abnormal levels of other serum enzymes: Secondary | ICD-10-CM | POA: Diagnosis not present

## 2018-08-03 DIAGNOSIS — E039 Hypothyroidism, unspecified: Secondary | ICD-10-CM | POA: Diagnosis not present

## 2018-08-03 DIAGNOSIS — I4891 Unspecified atrial fibrillation: Secondary | ICD-10-CM | POA: Diagnosis not present

## 2018-08-03 DIAGNOSIS — N179 Acute kidney failure, unspecified: Secondary | ICD-10-CM | POA: Diagnosis not present

## 2018-08-04 DIAGNOSIS — N179 Acute kidney failure, unspecified: Secondary | ICD-10-CM | POA: Diagnosis not present

## 2018-08-04 DIAGNOSIS — E039 Hypothyroidism, unspecified: Secondary | ICD-10-CM | POA: Diagnosis not present

## 2018-08-04 DIAGNOSIS — I4891 Unspecified atrial fibrillation: Secondary | ICD-10-CM | POA: Diagnosis not present

## 2018-08-05 DIAGNOSIS — I4891 Unspecified atrial fibrillation: Secondary | ICD-10-CM | POA: Diagnosis not present

## 2018-09-06 ENCOUNTER — Ambulatory Visit: Payer: BLUE CROSS/BLUE SHIELD | Admitting: Cardiovascular Disease

## 2018-09-18 ENCOUNTER — Encounter: Payer: Self-pay | Admitting: *Deleted

## 2018-09-20 ENCOUNTER — Encounter: Payer: Self-pay | Admitting: Internal Medicine

## 2018-09-20 ENCOUNTER — Ambulatory Visit (INDEPENDENT_AMBULATORY_CARE_PROVIDER_SITE_OTHER): Payer: BLUE CROSS/BLUE SHIELD | Admitting: Internal Medicine

## 2018-09-20 VITALS — BP 128/78 | HR 56 | Ht 66.0 in | Wt 277.0 lb

## 2018-09-20 DIAGNOSIS — I10 Essential (primary) hypertension: Secondary | ICD-10-CM | POA: Diagnosis not present

## 2018-09-20 DIAGNOSIS — I4819 Other persistent atrial fibrillation: Secondary | ICD-10-CM | POA: Diagnosis not present

## 2018-09-20 DIAGNOSIS — Z23 Encounter for immunization: Secondary | ICD-10-CM

## 2018-09-20 DIAGNOSIS — I428 Other cardiomyopathies: Secondary | ICD-10-CM

## 2018-09-20 MED ORDER — DILTIAZEM HCL ER COATED BEADS 240 MG PO CP24: 240.0000 mg | ORAL_CAPSULE | Freq: Two times a day (BID) | ORAL | Status: DC

## 2018-09-20 NOTE — Progress Notes (Signed)
PCP: Doree Albee, MD Primary Cardiologist: Dr [name] Primary EP: Dr Serita Kyle Isabella Hall is a 62 y.o. female who presents today for routine electrophysiology followup.  Since last being seen in our clinic, the patient reports doing very well.  she did have afib and hypotension in the setting of dehydration for which she was hospitalized at [facility]  I do not have records of this to review currently.  She did not require cardioversion and has done well since.  Today, she denies symptoms of palpitations, chest pain, shortness of breath,  lower extremity edema, dizziness, presyncope, or syncope.  The patient is otherwise without complaint today.   Past Medical History:  Diagnosis Date  . Atrial fibrillation (HCC)     CHADSVASC score 4  . Atrial flutter ([place])   . Essential hypertension   . Hypothyroidism   . Obesity   . Type 2 diabetes, diet controlled ([place])    Past Surgical History:  Procedure Laterality Date  . APPENDECTOMY  1979  . ATRIAL FIBRILLATION ABLATION  03/26/2018  . ATRIAL FIBRILLATION ABLATION N/A 03/26/2018   Procedure: ATRIAL FIBRILLATION ABLATION;  Surgeon: Thompson Grayer, MD;  Location: [facility];  Service: Cardiovascular;  Laterality: N/A;  . CARDIOVERSION N/A 01/25/2015   Procedure: CARDIOVERSION;  Surgeon: Satira Sark, MD;  [facility]: [facility];  Service: Cardiovascular;  Laterality: N/A;  . CHOLECYSTECTOMY OPEN  1979  . LEFT HEART CATHETERIZATION WITH CORONARY ANGIOGRAM N/A 01/27/2015   Procedure: LEFT HEART CATHETERIZATION WITH CORONARY ANGIOGRAM;  Surgeon: Sinclair Grooms, MD;  Location: [facility] CATH LAB;  Service: Cardiovascular;  Laterality: N/A;  . TEE WITHOUT CARDIOVERSION N/A 01/25/2015   Procedure: TRANSESOPHAGEAL ECHOCARDIOGRAM (TEE);  Surgeon: Satira Sark, MD;  Location: [facility];  Service: Cardiovascular;  Laterality: N/A;  . [place] are reviewed and negatives except as per HPI above  Current  Outpatient Medications  Medication Sig Dispense Refill  . blood glucose meter kit and supplies KIT Dispense based on patient and insurance preference. Use up to four times daily as directed. (FOR ICD-9 250.00, 250.01). 1 each 0  . CARTIA XT 240 MG 24 hr capsule TAKE 1 CAPSULE BY MOUTH TWICE DAILY 180 capsule 1  . carvedilol (COREG) 25 MG tablet TAKE 1 & 1/2 (ONE & ONE-HALF) TABLETS BY MOUTH TWICE DAILY 270 tablet 1  . CINNAMON PO Take 1 capsule by mouth daily.    Marland Kitchen diltiazem (CARDIZEM CD) 240 MG 24 hr capsule Take 1 capsule (240 mg total) by mouth daily.    Marland Kitchen estradiol (ESTRACE) 0.5 MG tablet Take 0.5 mg by mouth daily.    . furosemide (LASIX) 20 MG tablet TAKE ONE TABLET BY MOUTH ONCE DAILY 90 tablet 1  . GARLIC PO Take 1 tablet by mouth daily.    Marland Kitchen guaiFENesin (MUCINEX) 600 MG 12 hr tablet Take 1 tablet (600 mg total) by mouth 2 (two) times daily. 30 tablet 0  . Multiple Vitamin (MULTIVITAMIN WITH MINERALS) TABS tablet Take 1 tablet by mouth daily.    . potassium chloride (K-DUR) 10 MEQ tablet Take 1 tablet (10 mEq total) by mouth daily. 30 tablet 6  . progesterone (PROMETRIUM) 100 MG capsule Take 100 mg by mouth daily.    . rivaroxaban (XARELTO) 20 MG TABS tablet TAKE 1 TABLET BY MOUTH ONCE DAILY WITH SUPPER 90 tablet 3  . sodium chloride (OCEAN) 0.65 % nasal spray Place 1 spray into both nostrils  3 (three) times daily as needed. Uses daily, up to three times per day during allergy season    . thyroid (ARMOUR) 60 MG tablet Take 60 mg by mouth daily before breakfast.    . vitamin C (ASCORBIC ACID) 500 MG tablet Take 500 mg by mouth 2 (two) times daily.    . vitamin E 400 UNIT capsule Take 400 Units by mouth daily.    . pantoprazole (PROTONIX) 40 MG tablet Take 1 tablet (40 mg total) by mouth daily. 45 tablet 0   No current facility-administered medications for this visit.     Physical Exam: Vitals:   09/20/18 1157  BP: 128/78  Pulse: (!) 56  SpO2: 98%  Weight: 277 lb (125.6 kg)    Height: 5' 6" (1.676 m)    GEN- The patient is well appearing, alert and oriented x 3 today.   Head- normocephalic, atraumatic Eyes-  Sclera clear, conjunctiva pink Ears- hearing intact Oropharynx- clear Lungs- Clear to ausculation bilaterally, normal work of breathing Heart- Regular rate and rhythm, no murmurs, rubs or gallops, PMI not laterally displaced GI- soft, NT, ND, + BS Extremities- no clubbing, cyanosis, or edema  Wt Readings from Last 3 Encounters:  09/20/18 277 lb (125.6 kg)  06/26/18 257 lb (116.6 kg)  06/11/18 262 lb (118.8 kg)    EKG tracing ordered today is personally reviewed and shows sinus bradycardia  Assessment and Plan:  1. Persistent afib Doing well post ablation chads2vasc score is 4.  Continue xarelto  2. HTN Stable No change required today  3. Obesity Body mass index is 44.71 kg/m. Wt Readings from Last 3 Encounters:  09/20/18 277 lb (125.6 kg)  06/26/18 257 lb (116.6 kg)  06/11/18 262 lb (118.8 kg)  she has gained 20 lbs since her last visit We discussed at length today  4. Nonischemic CM Tachycardia mediated EF 50%, improved with sinus rhythm  Return in 3 months to see Dr [name] I will see in 6 months  Thompson Grayer MD, [facility] 09/20/2018 12:09 PM

## 2018-09-20 NOTE — Addendum Note (Signed)
Addended by: Julian Hy T on: 09/20/2018 03:20 PM   Modules accepted: Orders

## 2018-09-20 NOTE — Patient Instructions (Signed)
Medication Instructions:  Continue all current medications.  Labwork: none  Testing/Procedures: none  Follow-Up:  6 months with Dr. [name]  3 months with Dr. [name]  Any Other Special Instructions Will Be Listed Below (If Applicable).  If you need a refill on your cardiac medications before your next appointment, please call your pharmacy.

## 2018-09-25 ENCOUNTER — Ambulatory Visit: Payer: BLUE CROSS/BLUE SHIELD | Admitting: Cardiovascular Disease

## 2018-10-02 DIAGNOSIS — Z23 Encounter for immunization: Secondary | ICD-10-CM | POA: Diagnosis not present

## 2018-10-02 DIAGNOSIS — E119 Type 2 diabetes mellitus without complications: Secondary | ICD-10-CM | POA: Diagnosis not present

## 2018-10-02 DIAGNOSIS — E785 Hyperlipidemia, unspecified: Secondary | ICD-10-CM | POA: Diagnosis not present

## 2018-10-02 DIAGNOSIS — E2839 Other primary ovarian failure: Secondary | ICD-10-CM | POA: Diagnosis not present

## 2018-10-02 DIAGNOSIS — E039 Hypothyroidism, unspecified: Secondary | ICD-10-CM | POA: Diagnosis not present

## 2018-10-02 DIAGNOSIS — E559 Vitamin D deficiency, unspecified: Secondary | ICD-10-CM | POA: Diagnosis not present

## 2018-11-12 ENCOUNTER — Other Ambulatory Visit: Payer: Self-pay | Admitting: Cardiovascular Disease

## 2019-01-01 ENCOUNTER — Other Ambulatory Visit: Payer: Self-pay | Admitting: Cardiovascular Disease

## 2019-01-02 ENCOUNTER — Telehealth: Payer: Self-pay | Admitting: Cardiovascular Disease

## 2019-01-02 NOTE — Telephone Encounter (Signed)
Called patient to remind her of her appointment with Dr. Raliegh Ip on 01-03-2019.

## 2019-01-03 ENCOUNTER — Ambulatory Visit: Payer: BLUE CROSS/BLUE SHIELD | Admitting: Cardiovascular Disease

## 2019-03-13 DIAGNOSIS — E119 Type 2 diabetes mellitus without complications: Secondary | ICD-10-CM | POA: Diagnosis not present

## 2019-03-13 DIAGNOSIS — E039 Hypothyroidism, unspecified: Secondary | ICD-10-CM | POA: Diagnosis not present

## 2019-03-19 ENCOUNTER — Telehealth: Payer: Self-pay

## 2019-03-19 NOTE — Telephone Encounter (Signed)
Spoke with pt regarding virtual visit on 03/21/19. Pt stated she will check pulse day of appt, but is unable to check BP or upload EKG. Pt also stated she has been feeling quite well. Pt questions and concerns were address.

## 2019-03-21 ENCOUNTER — Telehealth (INDEPENDENT_AMBULATORY_CARE_PROVIDER_SITE_OTHER): Payer: BLUE CROSS/BLUE SHIELD | Admitting: Internal Medicine

## 2019-03-21 VITALS — BP 132/74 | HR 64 | Wt 242.0 lb

## 2019-03-21 DIAGNOSIS — I4819 Other persistent atrial fibrillation: Secondary | ICD-10-CM

## 2019-03-21 DIAGNOSIS — I10 Essential (primary) hypertension: Secondary | ICD-10-CM | POA: Diagnosis not present

## 2019-03-21 DIAGNOSIS — I428 Other cardiomyopathies: Secondary | ICD-10-CM | POA: Diagnosis not present

## 2019-03-21 MED ORDER — DILTIAZEM HCL ER COATED BEADS 240 MG PO CP24: 240.0000 mg | ORAL_CAPSULE | Freq: Every day | ORAL | 0 refills | Status: DC

## 2019-03-21 NOTE — Progress Notes (Signed)
Electrophysiology TeleHealth Note   Due to national recommendations of social distancing due to COVID 19, an audio/video telehealth visit is felt to be most appropriate for this patient at this time.  See MyChart message from today for the patient's consent to telehealth for [facility].   Date:  03/21/2019   ID:  Isabella Hall, DOB [birth year], MRN [id number]  Location: patient's home  Provider location: [address] [place]  Evaluation Performed: Follow-up visit  PCP:  Doree Albee, MD  Cardiologist:  Dr [name] Electrophysiologist:  Dr [name]  Chief Complaint:  afib  History of Present Illness:    Isabella Hall is a 63 y.o. female who presents via audio/video conferencing for a telehealth visit today.  Since last being seen in our clinic, the patient reports doing very well.  No afib since I saw her last.  She continues to exercise diligently and is working on weight reduction. Today, she denies symptoms of palpitations, chest pain, shortness of breath,  lower extremity edema, dizziness, presyncope, or syncope.  The patient is otherwise without complaint today.  The patient denies symptoms of fevers, chills, cough, or new SOB worrisome for COVID 19.  Past Medical History:  Diagnosis Date  . Atrial fibrillation (HCC)     CHADSVASC score 4  . Atrial flutter ([place])   . Essential hypertension   . Hypothyroidism   . Obesity   . Type 2 diabetes, diet controlled ([place])     Past Surgical History:  Procedure Laterality Date  . APPENDECTOMY  1979  . ATRIAL FIBRILLATION ABLATION  03/26/2018  . ATRIAL FIBRILLATION ABLATION N/A 03/26/2018   Procedure: ATRIAL FIBRILLATION ABLATION;  Surgeon: Thompson Grayer, MD;  Location: [facility] LAB;  Service: Cardiovascular;  Laterality: N/A;  . CARDIOVERSION N/A 01/25/2015   Procedure: CARDIOVERSION;  Surgeon: Satira Sark, MD;  Location: [facility];  Service: Cardiovascular;  Laterality: N/A;  . CHOLECYSTECTOMY OPEN   1979  . LEFT HEART CATHETERIZATION WITH CORONARY ANGIOGRAM N/A 01/27/2015   Procedure: LEFT HEART CATHETERIZATION WITH CORONARY ANGIOGRAM;  Surgeon: Sinclair Grooms, MD;  Location: [facility] CATH LAB;  Service: Cardiovascular;  Laterality: N/A;  . TEE WITHOUT CARDIOVERSION N/A 01/25/2015   Procedure: TRANSESOPHAGEAL ECHOCARDIOGRAM (TEE);  Surgeon: Satira Sark, MD;  Location: [facility];  Service: Cardiovascular;  Laterality: N/A;  . TUBAL LIGATION  1980    Current Outpatient Medications  Medication Sig Dispense Refill  . blood glucose meter kit and supplies KIT Dispense based on patient and insurance preference. Use up to four times daily as directed. (FOR ICD-9 250.00, 250.01). 1 each 0  . CARTIA XT 240 MG 24 hr capsule TAKE 1 CAPSULE BY MOUTH TWICE DAILY 180 capsule 1  . carvedilol (COREG) 25 MG tablet Take 25 mg by mouth 2 (two) times daily with a meal.    . CINNAMON PO Take 1 capsule by mouth daily.    Marland Kitchen estradiol (ESTRACE) 0.5 MG tablet Take 0.5 mg by mouth daily.    Marland Kitchen GARLIC PO Take 1 tablet by mouth daily.    Marland Kitchen guaiFENesin (MUCINEX) 600 MG 12 hr tablet Take 1 tablet (600 mg total) by mouth 2 (two) times daily. 30 tablet 0  . Multiple Vitamin (MULTIVITAMIN WITH MINERALS) TABS tablet Take 1 tablet by mouth daily.    . progesterone (PROMETRIUM) 100 MG capsule Take 100 mg by mouth daily.    . rivaroxaban (XARELTO) 20 MG TABS tablet TAKE 1 TABLET BY  MOUTH ONCE DAILY WITH SUPPER 90 tablet 3  . sodium chloride (OCEAN) 0.65 % nasal spray Place 1 spray into both nostrils 3 (three) times daily as needed. Uses daily, up to three times per day during allergy season    . thyroid (ARMOUR) 60 MG tablet Take 60 mg by mouth daily before breakfast.    . vitamin C (ASCORBIC ACID) 500 MG tablet Take 500 mg by mouth 2 (two) times daily.    . vitamin E 400 UNIT capsule Take 400 Units by mouth daily.    . carvedilol (COREG) 25 MG tablet TAKE 1 & 1/2 (ONE & ONE-HALF) TABLETS BY MOUTH TWICE DAILY (Patient taking  differently: Take 25 mg by mouth 2 (two) times daily with a meal. ) 270 tablet 1  . furosemide (LASIX) 20 MG tablet TAKE 1 TABLET BY MOUTH ONCE DAILY (Patient not taking: Reported on 03/21/2019) 90 tablet 0  . pantoprazole (PROTONIX) 40 MG tablet Take 1 tablet (40 mg total) by mouth daily. 45 tablet 0  . potassium chloride (K-DUR) 10 MEQ tablet Take 1 tablet (10 mEq total) by mouth daily. (Patient not taking: Reported on 03/21/2019) 30 tablet 6   No current facility-administered medications for this visit.     Allergies:   Patient has no known allergies.   Social History:  The patient  reports that she has never smoked. She has never used smokeless tobacco. She reports that she does not drink alcohol or use drugs.   Family History:  The patient's  family history includes Cancer in her maternal grandmother and mother; Coronary artery disease in her sister; Heart disease in her father; Hypertension in her unknown relative.   ROS:  Please see the history of present illness.   All other systems are personally reviewed and negative.    Exam:    Vital Signs:  BP 132/74   Pulse 64   Wt 242 lb (109.8 kg)   BMI 39.06 kg/m   Well appearing, alert and conversant, regular work of breathing,  good skin color Eyes- anicteric, neuro- grossly intact, skin- no apparent rash or lesions or cyanosis, mouth- oral mucosa is pink   Labs/Other Tests and Data Reviewed:    Recent Labs: 03/22/2018: Creatinine, Ser 1.40   Wt Readings from Last 3 Encounters:  03/21/19 242 lb (109.8 kg)  09/20/18 277 lb (125.6 kg)  06/26/18 257 lb (116.6 kg)     Other studies personally reviewed: Additional studies/ records that were reviewed today include: prior ablation, my last office notes  Review of the above records today demonstrates: as above Prior radiographs: cardiac CT 03/22/18- coronary calcium score is 0    ASSESSMENT & PLAN:    1.  Persistent afib Doing great post ablation off AAD therapy Reduce diltiazem  CD to 256m daily today Consider stopping diltiazem if still doing well on return. I hope to eventually wean coreg further also. Continue anticoagulation for chads2vasc score of 4 (on xarelto)  2. Overweight She is continuing to make progress with weight loss and working hard at this  3. HTN Stable No change required today  4. Nonischemic CM EF 50% Improved with sinus (tachy mediated)  5. COVID 19 screen The patient denies symptoms of COVID 19 at this time.  The importance of social distancing was discussed today.  Follow-up:  6 months with me   Current medicines are reviewed at length with the patient today.   The patient does not have concerns regarding her medicines.  The following  changes were made today:  none  Labs/ tests ordered today include:  No orders of the defined types were placed in this encounter.    Patient Risk:  after full review of this patients clinical status, I feel that they are at moderate risk at this time.  Today, I have spent 20 minutes with the patient with telehealth technology discussing afib and lifestyle modification.    Army Fossa, MD  03/21/2019 12:27 PM     [place] [address] [place] [address] [phone] (office) [phone] (fax)

## 2019-03-21 NOTE — Addendum Note (Signed)
Addended by: Thompson Grayer on: 03/21/2019 01:03 PM   Modules accepted: Orders

## 2019-05-05 ENCOUNTER — Other Ambulatory Visit: Payer: Self-pay | Admitting: Cardiovascular Disease

## 2019-05-26 ENCOUNTER — Other Ambulatory Visit: Payer: Self-pay | Admitting: Internal Medicine

## 2019-07-14 ENCOUNTER — Other Ambulatory Visit: Payer: Self-pay | Admitting: Cardiovascular Disease

## 2019-09-01 ENCOUNTER — Telehealth (INDEPENDENT_AMBULATORY_CARE_PROVIDER_SITE_OTHER): Payer: Self-pay

## 2019-09-01 ENCOUNTER — Other Ambulatory Visit (INDEPENDENT_AMBULATORY_CARE_PROVIDER_SITE_OTHER): Payer: Self-pay | Admitting: Internal Medicine

## 2019-09-01 DIAGNOSIS — Z780 Asymptomatic menopausal state: Secondary | ICD-10-CM

## 2019-09-01 MED ORDER — ESTRADIOL 1 MG PO TABS: 1.0000 mg | ORAL_TABLET | Freq: Every day | ORAL | 0 refills | Status: DC

## 2019-09-01 MED ORDER — PROGESTERONE MICRONIZED 200 MG PO CAPS: 200.0000 mg | ORAL_CAPSULE | Freq: Every day | ORAL | 0 refills | Status: DC

## 2019-09-02 MED ORDER — ESTRADIOL 1 MG PO TABS: 1.0000 mg | ORAL_TABLET | Freq: Every day | ORAL | Status: DC

## 2019-09-02 MED ORDER — PROGESTERONE MICRONIZED 100 MG PO CAPS: 200.0000 mg | ORAL_CAPSULE | Freq: Every day | ORAL | Status: DC

## 2019-09-02 NOTE — Telephone Encounter (Signed)
Medication filled yesterday

## 2019-09-19 ENCOUNTER — Other Ambulatory Visit: Payer: Self-pay | Admitting: Internal Medicine

## 2019-09-19 ENCOUNTER — Telehealth: Payer: BC Managed Care – PPO | Admitting: Internal Medicine

## 2019-09-19 MED ORDER — DILTIAZEM HCL ER COATED BEADS 240 MG PO CP24: 240.0000 mg | ORAL_CAPSULE | Freq: Every day | ORAL | 0 refills | Status: DC

## 2019-09-19 NOTE — Telephone Encounter (Signed)
° ° ° °*  STAT* If patient is at the pharmacy, call can be transferred to refill team.   1. Which medications need to be refilled? (please list name of each medication and dose if known) carvedilol (COREG) 25 MG tablet and diltiazem (CARDIZEM CD) 240 MG 24 hr capsule  2. Which pharmacy/location (including street and city if local pharmacy) is medication to be sent to? [place], [place] [id number] [place] [address]  3. Do they need a 30 day or 90 day supply? [place]

## 2019-09-19 NOTE — Telephone Encounter (Signed)
Medication sent to pharmacy  

## 2019-09-29 ENCOUNTER — Other Ambulatory Visit: Payer: Self-pay | Admitting: Cardiovascular Disease

## 2019-10-01 ENCOUNTER — Encounter (INDEPENDENT_AMBULATORY_CARE_PROVIDER_SITE_OTHER): Payer: Self-pay | Admitting: Internal Medicine

## 2019-10-01 ENCOUNTER — Ambulatory Visit (INDEPENDENT_AMBULATORY_CARE_PROVIDER_SITE_OTHER): Payer: BC Managed Care – PPO | Admitting: Internal Medicine

## 2019-10-01 ENCOUNTER — Other Ambulatory Visit: Payer: Self-pay

## 2019-10-01 ENCOUNTER — Encounter (INDEPENDENT_AMBULATORY_CARE_PROVIDER_SITE_OTHER): Payer: BLUE CROSS/BLUE SHIELD | Admitting: Internal Medicine

## 2019-10-01 DIAGNOSIS — Z20828 Contact with and (suspected) exposure to other viral communicable diseases: Secondary | ICD-10-CM | POA: Diagnosis not present

## 2019-10-01 DIAGNOSIS — Z20822 Contact with and (suspected) exposure to covid-19: Secondary | ICD-10-CM

## 2019-10-01 DIAGNOSIS — Z03818 Encounter for observation for suspected exposure to other biological agents ruled out: Secondary | ICD-10-CM | POA: Diagnosis not present

## 2019-10-01 DIAGNOSIS — J019 Acute sinusitis, unspecified: Secondary | ICD-10-CM | POA: Diagnosis not present

## 2019-10-01 MED ORDER — AMOXICILLIN-POT CLAVULANATE 875-125 MG PO TABS: 1.0000 | ORAL_TABLET | Freq: Two times a day (BID) | ORAL | 0 refills | Status: DC

## 2019-10-01 NOTE — Progress Notes (Signed)
Wellness Office Visit  Subjective:  Patient ID: Isabella Hall, female    DOB: 1956/01/08  Age: 63 y.o. MRN: [id number]  CC: This is an audio telemedicine visit with the permission of the patient who is at home and I am in my office. HPI  She describes a month-long illness of allergic type symptoms with sneezing and nasal and facial congestion but in the last 2 days she has had a temperature of 100.1 Fahrenheit, more nasal congestion with a dry cough.  She denies any dyspnea worse than usual.  She denies any myalgias/body aches.  She also describes right earache.  There is no obvious loss of hearing on that side. She has not been in contact with anyone to her knowledge with COVID-19 illness. Past Medical History:  Diagnosis Date  . Atrial fibrillation (HCC)     CHADSVASC score 4  . Atrial flutter ([place])   . Essential hypertension   . Hypothyroidism   . Obesity   . Type 2 diabetes, diet controlled ([place])       Family History  Problem Relation Age of Onset  . Cancer Mother   . Heart disease Father   . Hypertension Other   . Coronary artery disease Sister        died of an MI in her 39's  . Cancer Maternal Grandmother   . Atrial fibrillation Neg Hx   . Diabetes Neg Hx     Social History   Social History Narrative   Divorced for 6 years,was married 12 years.Lives alone.Works as Corporate treasurer for [employer] .     Current Meds  Medication Sig  . blood glucose meter kit and supplies KIT Dispense based on patient and insurance preference. Use up to four times daily as directed. (FOR ICD-9 250.00, 250.01).  . carvedilol (COREG) 25 MG tablet Take 25 mg by mouth 2 (two) times daily with a meal.  . CINNAMON PO Take 1 capsule by mouth daily.  Marland Kitchen diltiazem (CARDIZEM CD) 240 MG 24 hr capsule Take 1 capsule (240 mg total) by mouth daily.  Marland Kitchen estradiol (ESTRACE) 1 MG tablet Take 1 tablet (1 mg total) by mouth daily.  Marland Kitchen GARLIC PO Take 1 tablet by mouth daily.  . Multiple Vitamin (MULTIVITAMIN  WITH MINERALS) TABS tablet Take 1 tablet by mouth daily.  . progesterone (PROMETRIUM) 200 MG capsule Take 1 capsule (200 mg total) by mouth daily.   Current Facility-Administered Medications for the 10/01/19 encounter (Office Visit) with Doree Albee, MD  Medication  . progesterone (PROMETRIUM) capsule 200 mg      Objective:   Today's Vitals: There were no vitals taken for this visit. Vitals with BMI 10/01/2019 03/21/2019 09/20/2018  Height (No Data) - 5' 6"   Weight (No Data) 242 lbs 277 lbs  BMI - - 69.48  Systolic (No Data) 546 270  Diastolic (No Data) 74 78  Pulse - 64 56     Physical Exam   She appears alert and orientated on the phone.    Assessment   1. COVID-19 ruled out   2. Acute non-recurrent sinusitis, unspecified location       Tests ordered Orders Placed This Encounter  Procedures  . SARS-COV-2 RNA,(COVID-19) QUAL NAAT     Plan: 1. I think she likely does have a sinus infection and possibly ear infection.  I will treat her empirically with antibiotics. 2. I am concerned that she may actually have COVID-19 and I would recommend that she go for testing  now and I have sent an order.  I have told her to quarantine until she knows the result of the test.   Meds ordered this encounter  Medications  . amoxicillin-clavulanate (AUGMENTIN) 875-125 MG tablet    Sig: Take 1 tablet by mouth 2 (two) times daily.    Dispense:  20 tablet    Refill:  0    Teodor Prater Luther Parody, MD

## 2019-10-02 LAB — NOVEL CORONAVIRUS, NAA: SARS-CoV-2, NAA: NOT DETECTED

## 2019-10-08 ENCOUNTER — Telehealth (INDEPENDENT_AMBULATORY_CARE_PROVIDER_SITE_OTHER): Payer: Self-pay | Admitting: Internal Medicine

## 2019-10-08 ENCOUNTER — Encounter (INDEPENDENT_AMBULATORY_CARE_PROVIDER_SITE_OTHER): Payer: Self-pay | Admitting: Internal Medicine

## 2019-10-16 NOTE — Telephone Encounter (Signed)
Done

## 2019-10-17 ENCOUNTER — Other Ambulatory Visit: Payer: Self-pay | Admitting: Cardiovascular Disease

## 2019-10-17 NOTE — Telephone Encounter (Signed)
Contacted patient to confirm that she has been taking xarelto 20 mg. Patient confirmed that she has been taking this medication and has not been off of it

## 2019-10-18 ENCOUNTER — Other Ambulatory Visit: Payer: Self-pay | Admitting: Internal Medicine

## 2019-10-18 MED ORDER — RIVAROXABAN 20 MG PO TABS: 20.0000 mg | ORAL_TABLET | Freq: Every day | ORAL | 1 refills | Status: DC

## 2019-11-15 ENCOUNTER — Other Ambulatory Visit: Payer: Self-pay | Admitting: Cardiovascular Disease

## 2019-11-17 ENCOUNTER — Other Ambulatory Visit: Payer: Self-pay | Admitting: Cardiovascular Disease

## 2019-11-17 ENCOUNTER — Other Ambulatory Visit: Payer: Self-pay | Admitting: *Deleted

## 2019-11-17 MED ORDER — CARVEDILOL 25 MG PO TABS: 25.0000 mg | ORAL_TABLET | Freq: Two times a day (BID) | ORAL | 3 refills | Status: DC

## 2019-11-21 ENCOUNTER — Encounter: Payer: Self-pay | Admitting: Internal Medicine

## 2019-11-21 ENCOUNTER — Telehealth (INDEPENDENT_AMBULATORY_CARE_PROVIDER_SITE_OTHER): Payer: BC Managed Care – PPO | Admitting: Internal Medicine

## 2019-11-21 VITALS — HR 80 | Wt 240.0 lb

## 2019-11-21 DIAGNOSIS — I10 Essential (primary) hypertension: Secondary | ICD-10-CM | POA: Diagnosis not present

## 2019-11-21 DIAGNOSIS — I4819 Other persistent atrial fibrillation: Secondary | ICD-10-CM

## 2019-11-21 DIAGNOSIS — I428 Other cardiomyopathies: Secondary | ICD-10-CM | POA: Diagnosis not present

## 2019-11-21 MED ORDER — DILTIAZEM HCL ER COATED BEADS 240 MG PO CP24: 240.0000 mg | ORAL_CAPSULE | Freq: Every day | ORAL | 3 refills | Status: DC

## 2019-11-21 NOTE — Progress Notes (Signed)
Electrophysiology TeleHealth Note   Due to national recommendations of social distancing due to [place] 19, an audio telehealth visit is felt to be most appropriate for this patient at this time.  Verbal consent was obtained by me for the telehealth visit today.  The patient does not have capability for a virtual visit.  A phone visit is therefore required today.   Date:  11/21/2019   ID:  Isabella Hall, DOB September 11, 1956, MRN [id number]  Location: patient's home  Provider location:  [facility]  Evaluation Performed: Follow-up visit  PCP:  Doree Albee, MD   Electrophysiologist:  Dr [name]  Chief Complaint:  AF follow up  History of Present Illness:    Isabella Hall is a 63 y.o. female who presents via telehealth conferencing today.  Since last being seen in our clinic, the patient reports doing very well.  She has had no recurrent AF. Today, she denies symptoms of palpitations, chest pain, shortness of breath,  lower extremity edema, dizziness, presyncope, or syncope.  The patient is otherwise without complaint today.  The patient denies symptoms of fevers, chills, cough, or new SOB worrisome for COVID 19.  Past Medical History:  Diagnosis Date  . Atrial fibrillation (HCC)     CHADSVASC score 4  . Atrial flutter ([place])   . Essential hypertension   . Hypothyroidism   . Obesity   . Type 2 diabetes, diet controlled ([place])     Past Surgical History:  Procedure Laterality Date  . APPENDECTOMY  1979  . ATRIAL FIBRILLATION ABLATION  03/26/2018  . ATRIAL FIBRILLATION ABLATION N/A 03/26/2018   Procedure: ATRIAL FIBRILLATION ABLATION;  Surgeon: [place] Grayer, MD;  Location: [facility];  Service: Cardiovascular;  Laterality: N/A;  . CARDIOVERSION N/A 01/25/2015   Procedure: CARDIOVERSION;  Surgeon: Satira Sark, MD;  Location: [facility];  Service: Cardiovascular;  Laterality: N/A;  . CHOLECYSTECTOMY OPEN  1979  . LEFT HEART CATHETERIZATION WITH CORONARY ANGIOGRAM N/A  01/27/2015   Procedure: LEFT HEART CATHETERIZATION WITH CORONARY ANGIOGRAM;  Surgeon: Sinclair Grooms, MD;  Location: [facility] CATH LAB;  Service: Cardiovascular;  Laterality: N/A;  . TEE WITHOUT CARDIOVERSION N/A 01/25/2015   Procedure: TRANSESOPHAGEAL ECHOCARDIOGRAM (TEE);  Surgeon: Satira Sark, MD;  Location: [facility];  Service: Cardiovascular;  Laterality: N/A;  . TUBAL LIGATION  1980    Current Outpatient Medications  Medication Sig Dispense Refill  . blood glucose meter kit and supplies KIT Dispense based on patient and insurance preference. Use up to four times daily as directed. (FOR ICD-9 250.00, 250.01). 1 each 0  . carvedilol (COREG) 25 MG tablet Take 1 tablet (25 mg total) by mouth 2 (two) times daily with a meal. 180 tablet 3  . CINNAMON PO Take 1 capsule by mouth daily.    Marland Kitchen diltiazem (CARDIZEM CD) 240 MG 24 hr capsule Take 1 capsule (240 mg total) by mouth daily. 90 capsule 0  . estradiol (ESTRACE) 1 MG tablet Take 1 tablet (1 mg total) by mouth daily. 90 tablet 0  . GARLIC PO Take 1 tablet by mouth daily.    . Multiple Vitamin (MULTIVITAMIN WITH MINERALS) TABS tablet Take 1 tablet by mouth daily.    . progesterone (PROMETRIUM) 200 MG capsule Take 1 capsule (200 mg total) by mouth daily. 90 capsule 0  . rivaroxaban (XARELTO) 20 MG TABS tablet Take 1 tablet (20 mg total) by mouth daily with supper. 90 tablet 1   Current Facility-Administered  Medications  Medication Dose Route Frequency Provider Last Rate Last Dose  . progesterone (PROMETRIUM) capsule 200 mg  200 mg Oral Daily Gosrani, Nimish C, MD        Allergies:   Patient has no known allergies.   Social History:  The patient  reports that she has never smoked. She has never used smokeless tobacco. She reports that she does not drink alcohol or use drugs.   Family History:  The patient's family history includes Cancer in her maternal grandmother and mother; Coronary artery disease in her sister; Heart disease in her father;  Hypertension in an other family member.   ROS:  Please see the history of present illness.   All other systems are personally reviewed and negative.    Exam:    Vital Signs:  Wt 240 lb (108.9 kg)   BMI 38.74 kg/m   Well sounding and appearing, alert and conversant, regular work of breathing,  good skin color Eyes- anicteric, neuro- grossly intact, skin- no apparent rash or lesions or cyanosis, mouth- oral mucosa is pink  Labs/Other Tests and Data Reviewed:    Recent Labs: No results found for requested labs within last 8760 hours.   Wt Readings from Last 3 Encounters:  11/21/19 240 lb (108.9 kg)  03/21/19 242 lb (109.8 kg)  09/20/18 277 lb (125.6 kg)     ASSESSMENT & PLAN:    1.  Persistent atrial fibrillation Doing well off AAD therapy Continue Xarelto for CHADS2VASC of 4  2.  Overweight She continues to work hard at weight loss She is walking daily in the house  3.  HTN Stable No change required today  4.  NICM EF improved with SR   Follow-up:  With me in 6 months    Patient Risk:  after full review of this patients clinical status, I feel that they are at moderate risk at this time.  Today, I have spent 15 minutes with the patient with telehealth technology discussing arrhythmia management .    Army Fossa, MD  11/21/2019 9:36 AM     [facility] [address] [place] [address] [phone] (office) [phone] (fax)

## 2019-12-15 ENCOUNTER — Other Ambulatory Visit (INDEPENDENT_AMBULATORY_CARE_PROVIDER_SITE_OTHER): Payer: Self-pay | Admitting: Internal Medicine

## 2019-12-26 ENCOUNTER — Ambulatory Visit: Payer: BC Managed Care – PPO | Attending: Internal Medicine

## 2019-12-26 ENCOUNTER — Other Ambulatory Visit: Payer: Self-pay

## 2019-12-26 DIAGNOSIS — Z20822 Contact with and (suspected) exposure to covid-19: Secondary | ICD-10-CM

## 2019-12-27 LAB — NOVEL CORONAVIRUS, NAA: SARS-CoV-2, NAA: NOT DETECTED

## 2019-12-29 ENCOUNTER — Telehealth: Payer: Self-pay | Admitting: *Deleted

## 2019-12-29 NOTE — Telephone Encounter (Signed)
Patient called given negative covid results . 

## 2020-01-12 DIAGNOSIS — Z23 Encounter for immunization: Secondary | ICD-10-CM | POA: Diagnosis not present

## 2020-01-22 ENCOUNTER — Other Ambulatory Visit: Payer: Self-pay | Admitting: *Deleted

## 2020-01-22 MED ORDER — DILTIAZEM HCL ER COATED BEADS 240 MG PO CP24: 240.0000 mg | ORAL_CAPSULE | Freq: Two times a day (BID) | ORAL | 0 refills | Status: DC

## 2020-02-14 ENCOUNTER — Other Ambulatory Visit (INDEPENDENT_AMBULATORY_CARE_PROVIDER_SITE_OTHER): Payer: Self-pay | Admitting: Internal Medicine

## 2020-03-15 ENCOUNTER — Other Ambulatory Visit (INDEPENDENT_AMBULATORY_CARE_PROVIDER_SITE_OTHER): Payer: Self-pay | Admitting: Internal Medicine

## 2020-04-02 ENCOUNTER — Encounter: Payer: Self-pay | Admitting: Internal Medicine

## 2020-04-02 ENCOUNTER — Telehealth (INDEPENDENT_AMBULATORY_CARE_PROVIDER_SITE_OTHER): Payer: Self-pay | Admitting: Internal Medicine

## 2020-04-02 VITALS — BP 120/78 | HR 65 | Ht 65.0 in | Wt 260.0 lb

## 2020-04-02 DIAGNOSIS — I10 Essential (primary) hypertension: Secondary | ICD-10-CM

## 2020-04-02 DIAGNOSIS — I428 Other cardiomyopathies: Secondary | ICD-10-CM

## 2020-04-02 DIAGNOSIS — Z6841 Body Mass Index (BMI) 40.0 and over, adult: Secondary | ICD-10-CM

## 2020-04-02 DIAGNOSIS — I4819 Other persistent atrial fibrillation: Secondary | ICD-10-CM

## 2020-04-02 NOTE — Progress Notes (Signed)
Electrophysiology TeleHealth Note  Due to national recommendations of social distancing due to [place] 19, an audio telehealth visit is felt to be most appropriate for this patient at this time.  Verbal consent was obtained by me for the telehealth visit today.  The patient does not have capability for a virtual visit.  A phone visit is therefore required today.   Date:  04/02/2020   ID:  Isabella Hall, DOB August 23, 1956, MRN [id number]  Location: patient's home  Provider location:  [facility]  Evaluation Performed: Follow-up visit  PCP:  Doree Albee, MD   Electrophysiologist:  Dr [name]  Chief Complaint:  palpitations  History of Present Illness:    Isabella Hall is a 64 y.o. female who presents via telehealth conferencing today.  Since last being seen in our clinic, the patient reports doing very well. She had a 20 minute episode of palpitations 2 days ago.  She is unaware of any other arrhythmias since her ablation.  Today, she denies symptoms of chest pain, shortness of breath,  lower extremity edema, dizziness, presyncope, or syncope.  The patient is otherwise without complaint today.  The patient denies symptoms of fevers, chills, cough, or new SOB worrisome for COVID 19.  Past Medical History:  Diagnosis Date  . Atrial fibrillation (HCC)     CHADSVASC score 4  . Atrial flutter ([place])   . Essential hypertension   . Hypothyroidism   . Obesity   . Type 2 diabetes, diet controlled ([place])     Past Surgical History:  Procedure Laterality Date  . APPENDECTOMY  1979  . ATRIAL FIBRILLATION ABLATION  03/26/2018  . ATRIAL FIBRILLATION ABLATION N/A 03/26/2018   Procedure: ATRIAL FIBRILLATION ABLATION;  Surgeon: [place] Grayer, MD;  Location: [facility];  Service: Cardiovascular;  Laterality: N/A;  . CARDIOVERSION N/A 01/25/2015   Procedure: CARDIOVERSION;  Surgeon: Satira Sark, MD;  [facility]: [facility];  Service: Cardiovascular;  Laterality: N/A;  . CHOLECYSTECTOMY  OPEN  1979  . LEFT HEART CATHETERIZATION WITH CORONARY ANGIOGRAM N/A 01/27/2015   Procedure: LEFT HEART CATHETERIZATION WITH CORONARY ANGIOGRAM;  Surgeon: Sinclair Grooms, MD;  Location: [facility] CATH LAB;  Service: Cardiovascular;  Laterality: N/A;  . TEE WITHOUT CARDIOVERSION N/A 01/25/2015   Procedure: TRANSESOPHAGEAL ECHOCARDIOGRAM (TEE);  Surgeon: Satira Sark, MD;  Location: [facility];  Service: Cardiovascular;  Laterality: N/A;  . TUBAL LIGATION  1980    Current Outpatient Medications  Medication Sig Dispense Refill  . blood glucose meter kit and supplies KIT Dispense based on patient and insurance preference. Use up to four times daily as directed. (FOR ICD-9 250.00, 250.01). 1 each 0  . carvedilol (COREG) 25 MG tablet Take 1 tablet (25 mg total) by mouth 2 (two) times daily with a meal. 180 tablet 3  . CINNAMON PO Take 1 capsule by mouth daily.    Marland Kitchen estradiol (ESTRACE) 1 MG tablet Take 1 tablet (1 mg total) by mouth daily. 90 tablet 0  . GARLIC PO Take 1 tablet by mouth daily.    . Multiple Vitamin (MULTIVITAMIN WITH MINERALS) TABS tablet Take 1 tablet by mouth daily.    . NP THYROID 120 MG tablet Take 1 tablet by mouth once daily 30 tablet 0  . progesterone (PROMETRIUM) 200 MG capsule Take 1 capsule (200 mg total) by mouth daily. 90 capsule 0  . rivaroxaban (XARELTO) 20 MG TABS tablet Take 1 tablet (20 mg total) by mouth daily with supper.  90 tablet 1  . diltiazem (CARDIZEM CD) 240 MG 24 hr capsule Take 1 capsule (240 mg total) by mouth 2 (two) times daily for 10 days. THEN BACK TO DAILY THEREAFTER.  01/22/2020. (Patient taking differently: Take 240 mg by mouth daily. ) 20 capsule 0   Current Facility-Administered Medications  Medication Dose Route Frequency Provider Last Rate Last Admin  . progesterone (PROMETRIUM) capsule 200 mg  200 mg Oral Daily Isabella, Nimish C, MD        Allergies:   Patient has no known allergies.   Social History:  The patient  reports that she has never  smoked. She has never used smokeless tobacco. She reports that she does not drink alcohol or use drugs.   Family History:  The patient's family history includes Cancer in her maternal grandmother and mother; Coronary artery disease in her sister; Heart disease in her father; Hypertension in an other family member.   ROS:  Please see the history of present illness.   All other systems are personally reviewed and negative.    Exam:    Vital Signs:  BP 120/78   Pulse 65   Ht 5' 5"  (1.651 m)   Wt 260 lb (117.9 kg)   BMI 43.27 kg/m   Well sounding, alert and conversant   Labs/Other Tests and Data Reviewed:    Recent Labs: No results found for requested labs within last 8760 hours.   Wt Readings from Last 3 Encounters:  04/02/20 260 lb (117.9 kg)  11/21/19 240 lb (108.9 kg)  03/21/19 242 lb (109.8 kg)     ASSESSMENT & PLAN:    1.  Persistent afib Well controlled post ablation off AAD therapy She is compliant with xarelto, chads2vasc score is 4  2. HTN Stable No change required today  3. Obesity She has gained 20 lbs since her last visit We discussed this at length today  4. Nonischemic  EF has improved with sinus rhythm Would repeat echo on follow-up with Dr [name]  Follow-up:   Overdue to see Dr [name], Follow-up with me in 6 months   Patient Risk:  after full review of this patients clinical status, I feel that they are at moderate risk at this time.  Today, I have spent 15 minutes with the patient with telehealth technology discussing arrhythmia management .    Army Fossa, MD  04/02/2020 9:15 AM     [facility] [address] [place] [address] [phone] (office) [phone] (fax)

## 2020-04-14 ENCOUNTER — Other Ambulatory Visit (INDEPENDENT_AMBULATORY_CARE_PROVIDER_SITE_OTHER): Payer: Self-pay | Admitting: Internal Medicine

## 2020-04-19 ENCOUNTER — Telehealth (INDEPENDENT_AMBULATORY_CARE_PROVIDER_SITE_OTHER): Payer: Self-pay | Admitting: Internal Medicine

## 2020-04-19 ENCOUNTER — Other Ambulatory Visit (INDEPENDENT_AMBULATORY_CARE_PROVIDER_SITE_OTHER): Payer: Self-pay | Admitting: Internal Medicine

## 2020-04-19 NOTE — Telephone Encounter (Signed)
I have refused this refill because she has not been seen in the office for many months.  She needs to make an appointment before we can refill the medication.

## 2020-04-26 ENCOUNTER — Encounter (INDEPENDENT_AMBULATORY_CARE_PROVIDER_SITE_OTHER): Payer: Self-pay | Admitting: Internal Medicine

## 2020-04-26 ENCOUNTER — Ambulatory Visit (INDEPENDENT_AMBULATORY_CARE_PROVIDER_SITE_OTHER): Payer: 59 | Admitting: Internal Medicine

## 2020-04-26 ENCOUNTER — Other Ambulatory Visit: Payer: Self-pay

## 2020-04-26 VITALS — BP 136/90 | HR 79 | Temp 98.1°F | Resp 18 | Ht 66.0 in | Wt 301.6 lb

## 2020-04-26 DIAGNOSIS — Z780 Asymptomatic menopausal state: Secondary | ICD-10-CM

## 2020-04-26 DIAGNOSIS — R5381 Other malaise: Secondary | ICD-10-CM

## 2020-04-26 DIAGNOSIS — R5383 Other fatigue: Secondary | ICD-10-CM | POA: Diagnosis not present

## 2020-04-26 MED ORDER — NP THYROID 120 MG PO TABS: 120.0000 mg | ORAL_TABLET | Freq: Every day | ORAL | 3 refills | Status: DC

## 2020-04-26 NOTE — Progress Notes (Signed)
Metrics: Intervention Frequency ACO  Documented Smoking Status Yearly  Screened one or more times in 24 months  Cessation Counseling or  Active cessation medication Past 24 months  Past 24 months   Guideline developer: UpToDate (See UpToDate for funding source) Date Released: 2014       Wellness Office Visit  Subjective:  Patient ID: Isabella Hall, female    DOB: 20-Oct-1956  Age: 64 y.o. MRN: [id number]  CC: This lady comes in for follow-up of hypothyroidism, hypertension, diabetes, obesity. HPI  Unfortunately, since last time I saw her, she has gained weight.  She tells me that identical hormone therapy made her very hungry and this is why she has gained weight. She has not been taking her desiccated thyroid for the last week as she ran out.  She now comes in to get refill of the NP thyroid. Otherwise, she feels well.  She is now continue to eat better and exercise on a frequent basis. Past Medical History:  Diagnosis Date  . Atrial fibrillation (HCC)     CHADSVASC score 4  . Atrial flutter ([place])   . Essential hypertension   . Hypothyroidism   . Obesity   . Type 2 diabetes, diet controlled ([place])       Family History  Problem Relation Age of Onset  . Cancer Mother   . Heart disease Father   . Hypertension Other   . Coronary artery disease Sister        died of an MI in her 10's  . Cancer Maternal Grandmother   . Atrial fibrillation Neg Hx   . Diabetes Neg Hx     Social History   Social History Narrative   Divorced for 6 years,was married 12 years.Lives alone.Works as Corporate treasurer for [employer] .   Social History   Tobacco Use  . Smoking status: Never Smoker  . Smokeless tobacco: Never Used  Substance Use Topics  . Alcohol use: No    Alcohol/week: 0.0 standard drinks    Current Meds  Medication Sig  . blood glucose meter kit and supplies KIT Dispense based on patient and insurance preference. Use up to four times daily as directed. (FOR ICD-9 250.00, 250.01).  .  carvedilol (COREG) 25 MG tablet Take 1 tablet (25 mg total) by mouth 2 (two) times daily with a meal.  . CINNAMON PO Take 1 capsule by mouth daily.  Marland Kitchen GARLIC PO Take 1 tablet by mouth daily.  . Multiple Vitamin (MULTIVITAMIN WITH MINERALS) TABS tablet Take 1 tablet by mouth daily.  . NP THYROID 120 MG tablet Take 1 tablet (120 mg total) by mouth daily.  . rivaroxaban (XARELTO) 20 MG TABS tablet Take 1 tablet (20 mg total) by mouth daily with supper.  . [DISCONTINUED] estradiol (ESTRACE) 1 MG tablet Take 1 tablet (1 mg total) by mouth daily.  . [DISCONTINUED] NP THYROID 120 MG tablet Take 1 tablet by mouth once daily  . [DISCONTINUED] progesterone (PROMETRIUM) 200 MG capsule Take 1 capsule (200 mg total) by mouth daily.   Current Facility-Administered Medications for the 04/26/20 encounter (Office Visit) with Doree Albee, MD  Medication  . progesterone (PROMETRIUM) capsule 200 mg      Objective:   Today's Vitals: BP 136/90 (BP Location: Left Arm, Patient Position: Sitting, Cuff Size: Normal)   Pulse 79   Temp 98.1 F (36.7 C) (Temporal)   Resp 18   Ht _0  (1.676 m)   Wt (!) 301 lb 9.6 oz (136.8 kg)  BMI 48.68 kg/m  Vitals with BMI 04/26/2020 04/02/2020 11/21/2019  Height _0  _1  -  Weight 301 lbs 10 oz 260 lbs 240 lbs  BMI 94.1 74.08 -  Systolic 144 818 -  Diastolic 90 78 -  Pulse 79 65 80     Physical Exam  She looks systemically well but remains morbidly obese.  She has gained 40 pounds in a very short space of time it appears.  Blood pressure borderline normal.  Alert and orientated without any focal neurologic signs.     Assessment   1. Menopause   2. Malaise and fatigue   3. Morbid obesity ([place])       Tests ordered No orders of the defined types were placed in this encounter.    Plan: 1. I have refilled her NP thyroid today. 2. I will see her back in about 6 weeks to see how she is doing and check all the blood work at that time.   Meds  ordered this encounter  Medications  . NP THYROID 120 MG tablet    Sig: Take 1 tablet (120 mg total) by mouth daily.    Dispense:  30 tablet    Refill:  3    Ermagene Saidi Luther Parody, MD

## 2020-05-10 ENCOUNTER — Other Ambulatory Visit: Payer: Self-pay | Admitting: Internal Medicine

## 2020-05-11 ENCOUNTER — Other Ambulatory Visit: Payer: Self-pay

## 2020-05-11 MED ORDER — DILTIAZEM HCL ER COATED BEADS 240 MG PO CP24: 240.0000 mg | ORAL_CAPSULE | Freq: Every day | ORAL | 1 refills | Status: DC

## 2020-05-20 NOTE — Telephone Encounter (Signed)
Complete

## 2020-06-17 ENCOUNTER — Ambulatory Visit (INDEPENDENT_AMBULATORY_CARE_PROVIDER_SITE_OTHER): Payer: 59 | Admitting: Internal Medicine

## 2020-07-12 ENCOUNTER — Ambulatory Visit (INDEPENDENT_AMBULATORY_CARE_PROVIDER_SITE_OTHER): Payer: 59 | Admitting: Internal Medicine

## 2020-07-17 ENCOUNTER — Other Ambulatory Visit: Payer: Self-pay | Admitting: Internal Medicine

## 2020-07-19 NOTE — Telephone Encounter (Signed)
Pt's PCP stated that pt has not had any blood work done since October. Pt only has 1 tablet of xarelto left. Informed pt that a 1 month supply of Xarelto would be sent. Put comment on pt's appointment note with Dr. Domenic Polite that pt will need a cbc and bmet for Xarelto follow up. Pt updated.   Prescription refill sent.

## 2020-07-19 NOTE — Telephone Encounter (Signed)
Called and spoke to pt who stated that she had blood work done 3-4 months ago by her PCP. Called her pcp and LMOM on machine to call coumadin clinic back to get copy of the blood work.

## 2020-07-19 NOTE — Telephone Encounter (Addendum)
Prescription refill request for Xarelto received.   Last office visit: 11/21/2019, [place] Weight: 136.8 kg  Age: 64 y.o. Scr: 1.4 03/22/2018 CrCl: 89.98 ml/min     Pt overdue for blood work.

## 2020-07-27 ENCOUNTER — Ambulatory Visit: Payer: Self-pay | Admitting: Cardiovascular Disease

## 2020-08-12 ENCOUNTER — Other Ambulatory Visit (INDEPENDENT_AMBULATORY_CARE_PROVIDER_SITE_OTHER): Payer: Self-pay | Admitting: Internal Medicine

## 2020-08-12 ENCOUNTER — Telehealth (INDEPENDENT_AMBULATORY_CARE_PROVIDER_SITE_OTHER): Payer: Self-pay | Admitting: Internal Medicine

## 2020-08-12 MED ORDER — THYROID 60 MG PO TABS: 120.0000 mg | ORAL_TABLET | Freq: Every day | ORAL | 3 refills | Status: DC

## 2020-08-12 NOTE — Telephone Encounter (Signed)
Since she is taking NP thyroid 120 mg tablets and these are in short supply, I have sent a new prescription for NP thyroid 60 mg tablets, take 2 daily to [facility] on [address].  Hopefully this will work out.  Please let her know.  Thanks.

## 2020-08-16 ENCOUNTER — Ambulatory Visit: Payer: Self-pay | Admitting: Cardiology

## 2020-08-17 ENCOUNTER — Other Ambulatory Visit: Payer: Self-pay | Admitting: Internal Medicine

## 2020-09-03 ENCOUNTER — Ambulatory Visit: Payer: Self-pay | Admitting: Family Medicine

## 2020-09-03 NOTE — Progress Notes (Deleted)
Cardiology Office Note  Date: 09/03/2020   ID: Isabella Hall, DOB [birth year], MRN [id number]  PCP:  Doree Albee, MD  Cardiologist:  No primary care provider on file. Electrophysiologist:  Thompson Grayer, MD   Chief Complaint: Persistent atrial fibrillation  History of Present Illness: Isabella Hall is a 64 y.o. female with a history of persistent atrial fibrillation, HTN, hypothyroidism, obesity, type II DM.  Last encounter via telemedicine 04/02/2020 with Dr. [name].  He stated she had experienced a 20-minute episode of palpitations 2 days prior.  She was unaware of any other arrhythmias since her ablation.  She denied any chest pain, shortness of breath, lower extremity edema, dizziness, presyncope or syncope.  Her persistent atrial fibrillation was well controlled post ablation off AAD therapy compliant with Xarelto.  CHA2DS2-VASc score of 4.  Hypertension was stable no changes were made.  History of obesity and had gained 20 pounds since her last visit.  History of nonischemic cardiomyopathy.  EF had improved with sinus rhythm.  Plans to repeat echo on follow-up with Dr. [name].  She was overdue to see Dr. [name].  Past Medical History:  Diagnosis Date   Atrial fibrillation ([place])     CHADSVASC score 4   Atrial flutter ([place])    Essential hypertension    Hypothyroidism    Obesity    Type 2 diabetes, diet controlled ([place])     Past Surgical History:  Procedure Laterality Date   APPENDECTOMY  1979   ATRIAL FIBRILLATION ABLATION  03/26/2018   ATRIAL FIBRILLATION ABLATION N/A 03/26/2018   Procedure: ATRIAL FIBRILLATION ABLATION;  Surgeon: [place] Grayer, MD;  Location: [facility];  Service: Cardiovascular;  Laterality: N/A;   CARDIOVERSION N/A 01/25/2015   Procedure: CARDIOVERSION;  Surgeon: Satira Sark, MD;  [facility];  Service: Cardiovascular;  Laterality: N/A;   CHOLECYSTECTOMY OPEN  1979   LEFT HEART CATHETERIZATION WITH CORONARY  ANGIOGRAM N/A 01/27/2015   Procedure: LEFT HEART CATHETERIZATION WITH CORONARY ANGIOGRAM;  Surgeon: Sinclair Grooms, MD;  Location[facility] C[facility] LAB;  Service: Cardiovascular;  Laterality: N/A;   TEE WITHOUT CARDIOVERSION N/A 01/25/2015   Procedure: TRANSESOPHAGEAL ECHOCARDIOGRAM (TEE);  Surgeon: Satira Sark, MD;  Location: [facility];  Service: Cardiovascular;  Laterality: N/A;   TUBAL LIGATION  1980    Current Outpatient Medications  Medication Sig Dispense Refill   blood glucose meter kit and supplies KIT Dispense based on patient and insurance preference. Use up to four times daily as directed. (FOR ICD-9 250.00, 250.01). 1 each 0   carvedilol (COREG) 25 MG tablet Take 1 tablet (25 mg total) by mouth 2 (two) times daily with a meal. 180 tablet 3   CINNAMON PO Take 1 capsule by mouth daily.     diltiazem (CARDIZEM CD) 240 MG 24 hr capsule Take 1 capsule (240 mg total) by mouth daily. 90 capsule 1   GARLIC PO Take 1 tablet by mouth daily.     Multiple Vitamin (MULTIVITAMIN WITH MINERALS) TABS tablet Take 1 tablet by mouth daily.     thyroid (NP THYROID) 60 MG tablet Take 2 tablets (120 mg total) by mouth daily before breakfast. 60 tablet 3   XARELTO 20 MG TABS tablet TAKE 1 TABLET BY MOUTH ONCE DAILY WITH SUPPER 30 tablet 6   Current Facility-Administered Medications  Medication Dose Route Frequency Provider Last Rate Last Admin   progesterone (PROMETRIUM) capsule 200 mg  200 mg Oral Daily Doree Albee, MD  Allergies:  Patient has no known allergies.   Social History: The patient  reports that she has never smoked. She has never used smokeless tobacco. She reports that she does not drink alcohol and does not use drugs.   Family History: The patient's family history includes Cancer in her maternal grandmother and mother; Coronary artery disease in her sister; Heart disease in her father; Hypertension in an other family member.   ROS:  Please see the history of present  illness. Otherwise, complete review of systems is positive for none.  All other systems are reviewed and negative.   Physical Exam: VS:  There were no vitals taken for this visit., BMI There is no height or weight on file to calculate BMI.  Wt Readings from Last 3 Encounters:  04/26/20 (!) 301 lb 9.6 oz (136.8 kg)  04/02/20 260 lb (117.9 kg)  11/21/19 240 lb (108.9 kg)    General: Patient appears comfortable at rest. HEENT: Conjunctiva and lids normal, oropharynx clear with moist mucosa. Neck: Supple, no elevated JVP or carotid bruits, no thyromegaly. Lungs: Clear to auscultation, nonlabored breathing at rest. Cardiac: Regular rate and rhythm, no S3 or significant systolic murmur, no pericardial rub. Abdomen: Soft, nontender, no hepatomegaly, bowel sounds present, no guarding or rebound. Extremities: No pitting edema, distal pulses 2+. Skin: Warm and dry. Musculoskeletal: No kyphosis. Neuropsychiatric: Alert and oriented x3, affect grossly appropriate.  ECG:  {EKG/Telemetry Strips Reviewed:[phone]}  Recent Labwork: No results found for requested labs within last 8760 hours.     Component Value Date/Time   CHOL 147 01/21/2015 0852   TRIG 61 01/21/2015 0852   HDL 45 01/21/2015 0852   CHOLHDL 3.3 01/21/2015 0852   VLDL 12 01/21/2015 0852   LDLCALC 90 01/21/2015 0852    Other Studies Reviewed Today:  Atrial fibrillation ablation of 03/26/2018 CONCLUSIONS: 1. Atrial fibrillation upon presentation.   2. Intracardiac echo reveals severe biatrial enlargement with four separate pulmonary veins without evidence of pulmonary vein stenosis. 3. Successful electrical isolation and anatomical encircling of all four pulmonary veins with radiofrequency current.  A WACA approach was used 3. Additional left atrial ablation was performed with a standard box lesion created along the posterior wall of the left atrium 4. Atrial fibrillation successfully cardioverted to sinus rhythm. 5. No early  apparent complications.   Echocardiogram 08/07/2017 Study Conclusions   - Left ventricle: The cavity size was normal. Wall thickness was  increased in a pattern of mild LVH. Systolic function was at the  lower limits of normal. The estimated ejection fraction was 50%.  The study was not technically sufficient to allow evaluation of  LV diastolic dysfunction due to atrial fibrillation.  - Mitral valve: There was mild regurgitation.  - Left atrium: The atrium was severely dilated.  - Systemic veins: IVC is dilated with normal respiratory variation.  Estimated CVP 8 mmHg.  Assessment and Plan:  1. Persistent atrial fibrillation ([place])   2. Benign essential HTN   3. Acquired hypothyroidism    1. Persistent atrial fibrillation (HCC) ***  2. Benign essential HTN ***  Medication Adjustments/Labs and Tests Ordered: Current medicines are reviewed at length with the patient today.  Concerns regarding medicines are outlined above.   Disposition: Follow-up with ***  Signed, Levell July, NP 09/03/2020 12:39 AM    [place] [facility] [place], [place], [place] [address] Phone: [phone]; Fax: [phone]

## 2020-09-06 ENCOUNTER — Other Ambulatory Visit: Payer: Self-pay

## 2020-09-06 ENCOUNTER — Ambulatory Visit
Admission: EM | Admit: 2020-09-06 | Discharge: 2020-09-06 | Disposition: A | Discharge status: Home / Self Care | Payer: 59 | Attending: Emergency Medicine | Admitting: Emergency Medicine

## 2020-09-06 ENCOUNTER — Ambulatory Visit (INDEPENDENT_AMBULATORY_CARE_PROVIDER_SITE_OTHER): Payer: 59 | Admitting: Nurse Practitioner

## 2020-09-06 DIAGNOSIS — Z1152 Encounter for screening for COVID-19: Secondary | ICD-10-CM | POA: Diagnosis not present

## 2020-09-06 DIAGNOSIS — J069 Acute upper respiratory infection, unspecified: Secondary | ICD-10-CM

## 2020-09-06 MED ORDER — BENZONATATE 100 MG PO CAPS: 100.0000 mg | ORAL_CAPSULE | Freq: Three times a day (TID) | ORAL | 0 refills | Status: DC

## 2020-09-06 MED ORDER — AZITHROMYCIN 250 MG PO TABS: 250.0000 mg | ORAL_TABLET | Freq: Every day | ORAL | 0 refills | Status: DC

## 2020-09-06 NOTE — Discharge Instructions (Addendum)
COVID testing ordered.  It will take between 2-7 days for test results.  Someone will contact you regarding abnormal results.    In the meantime: You should remain isolated in your home for 10 days from symptom onset AND greater than 24 hours after symptoms resolution (absence of fever without the use of fever-reducing medication and improvement in respiratory symptoms), whichever is longer Get plenty of rest and push fluids Tessalon Perles prescribed for cough Azithromycin prescribed for possible bronchitis Use medications daily for symptom relief Use OTC medications like ibuprofen or tylenol as needed fever or pain Call or go to the ED if you have any new or worsening symptoms such as fever, worsening cough, shortness of breath, chest tightness, chest pain, turning blue, changes in mental status, etc..Marland Kitchen

## 2020-09-06 NOTE — ED Triage Notes (Signed)
Pt presents with c/o fatigue and some cough since Thursday

## 2020-09-06 NOTE — ED Provider Notes (Signed)
[facility]   [id number] 09/06/20 Arrival Time: 1629   CC: COVID symptoms  SUBJECTIVE: History from: patient.  Isabella Hall is a 64 y.o. female with history of bronchitis presents to the urgent care for complaint of cough, shortness of breath, chest tightness fatigue for the past 3 to 4 days.  Denies sick exposure to COVID, flu or strep.  Denies recent travel.  Has tried OTC medication without relief.  Denies aggravating factors.  Denies previous symptoms in the past.   Denies fever, chills, fatigue, sinus pain, rhinorrhea, sore throat, SOB, wheezing, chest pain, nausea, changes in bowel or bladder habits.     ROS: As per HPI.  All other pertinent ROS negative.     Past Medical History:  Diagnosis Date  . Atrial fibrillation (HCC)     CHADSVASC score 4  . Atrial flutter ([place])   . Essential hypertension   . Hypothyroidism   . Obesity   . Type 2 diabetes, diet controlled ([place])    Past Surgical History:  Procedure Laterality Date  . APPENDECTOMY  1979  . ATRIAL FIBRILLATION ABLATION  03/26/2018  . ATRIAL FIBRILLATION ABLATION N/A 03/26/2018   Procedure: ATRIAL FIBRILLATION ABLATION;  Surgeon: Thompson Grayer, MD;  Location: [facility];  Service: Cardiovascular;  Laterality: N/A;  . CARDIOVERSION N/A 01/25/2015   Procedure: CARDIOVERSION;  Surgeon: Satira Sark, MD;  Location: [facility];  Service: Cardiovascular;  Laterality: N/A;  . CHOLECYSTECTOMY OPEN  1979  . LEFT HEART CATHETERIZATION WITH CORONARY ANGIOGRAM N/A 01/27/2015   Procedure: LEFT HEART CATHETERIZATION WITH CORONARY ANGIOGRAM;  Surgeon: Sinclair Grooms, MD;  Location: [facility];  Service: Cardiovascular;  Laterality: N/A;  . TEE WITHOUT CARDIOVERSION N/A 01/25/2015   Procedure: TRANSESOPHAGEAL ECHOCARDIOGRAM (TEE);  Surgeon: Satira Sark, MD;  Location: [facility];  Service: Cardiovascular;  Laterality: N/A;  . TUBAL LIGATION  1980   No Known Allergies Current Facility-Administered Medications  on File Prior to Encounter  Medication Dose Route Frequency Provider Last Rate Last Admin  . progesterone (PROMETRIUM) capsule 200 mg  200 mg Oral Daily Gosrani, Nimish C, MD       Current Outpatient Medications on File Prior to Encounter  Medication Sig Dispense Refill  . blood glucose meter kit and supplies KIT Dispense based on patient and insurance preference. Use up to four times daily as directed. (FOR ICD-9 250.00, 250.01). 1 each 0  . carvedilol (COREG) 25 MG tablet Take 1 tablet (25 mg total) by mouth 2 (two) times daily with a meal. 180 tablet 3  . CINNAMON PO Take 1 capsule by mouth daily.    Marland Kitchen diltiazem (CARDIZEM CD) 240 MG 24 hr capsule Take 1 capsule (240 mg total) by mouth daily. 90 capsule 1  . GARLIC PO Take 1 tablet by mouth daily.    . Multiple Vitamin (MULTIVITAMIN WITH MINERALS) TABS tablet Take 1 tablet by mouth daily.    Marland Kitchen thyroid (NP THYROID) 60 MG tablet Take 2 tablets (120 mg total) by mouth daily before breakfast. 60 tablet 3  . XARELTO 20 MG TABS tablet TAKE 1 TABLET BY MOUTH ONCE DAILY WITH SUPPER 30 tablet 6  . [DISCONTINUED] NP THYROID 120 MG tablet Take 1 tablet by mouth daily 30 tablet 1   Social History   Socioeconomic History  . Marital status: Divorced    Spouse name: Not on file  . Number of children: Not on file  . Years of education: Not on file  .  Highest education level: Not on file  Occupational History  . 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 Smoking status: Never Smoker  . Smokeless tobacco: Never Used  Vaping Use  . Vaping Use: Never used  Substance and Sexual Activity  . Alcohol use: No    Alcohol/week: 0.0 standard drinks  . Drug use: No  . Sexual activity: Not on file  Other Topics Concern  . Not on file  Social History Narrative   Divorced for 6 years,was married 12 years.Lives alone.Works as Corporate treasurer for [employer] .   Social Determinants of Health   Financial Resource Strain:   . Difficulty of  Paying Living Expenses: Not on file  Food Insecurity:   . Worried About Charity fundraiser in the Last Year: Not on file  . Ran Out of Food in the Last Year: Not on file  Transportation Needs:   . Lack of Transportation (Medical): Not on file  . Lack of Transportation (Non-Medical): Not on file  Physical Activity:   . Days of Exercise per Week: Not on file  . Minutes of Exercise per Session: Not on file  Stress:   . Feeling of Stress : Not on file  Social Connections:   . Frequency of Communication with Friends and Family: Not on file  . Frequency of Social Gatherings with Friends and Family: Not on file  . Attends Religious Services: Not on file  . Active Member of Clubs or Organizations: Not on file  . Attends Archivist Meetings: Not on file  . Marital Status: Not on file  Intimate Partner Violence:   . Fear of Current or Ex-Partner: Not on file  . Emotionally Abused: Not on file  . Physically Abused: Not on file  . Sexually Abused: Not on file   Family History  Problem Relation Age of Onset  . Cancer Mother   . Heart disease Father   . Hypertension Other   . Coronary artery disease Sister        died of an MI in her 3's  . Cancer Maternal Grandmother   . Atrial fibrillation Neg Hx   . Diabetes Neg Hx     OBJECTIVE:  Vitals:   09/06/20 1735  BP: (!) 111/53  Pulse: 73  Resp: 20  Temp: 97.7 F (36.5 C)  SpO2: 98%     General appearance: alert; appears fatigued, but nontoxic; speaking in full sentences and tolerating own secretions HEENT: NCAT; Ears: EACs clear, TMs pearly gray; Eyes: PERRL.  EOM grossly intact. Sinuses: nontender; Nose: nares patent without rhinorrhea, Throat: oropharynx clear, tonsils non erythematous or enlarged, uvula midline  Neck: supple without LAD Lungs: unlabored respirations, symmetrical air entry; cough: mild; no respiratory distress; CTAB Heart: regular rate and rhythm.  Radial pulses 2+ symmetrical bilaterally Skin: warm  and dry Psychological: alert and cooperative; normal mood and affect  LABS:  No results found for this or any previous visit (from the past 24 hour(s)).   ASSESSMENT & PLAN:  1. Acute URI   2. Encounter for screening for COVID-19     Meds ordered this encounter  Medications  . benzonatate (TESSALON) 100 MG capsule    Sig: Take 1 capsule (100 mg total) by mouth every 8 (eight) hours.    Dispense:  30 capsule    Refill:  0  . azithromycin (ZITHROMAX) 250 MG tablet    Sig: Take 1 tablet (250 mg total) by mouth daily. Take  first 2 tablets together, then 1 every day until finished.    Dispense:  6 tablet    Refill:  0     Patient is stable at discharge.  COVID-19 test was completed for rule out.  Will prescribe azithromycin for possible bronchitis    COVID testing ordered.  It will take between 2-7 days for test results.  Someone will contact you regarding abnormal results.    In the meantime: You should remain isolated in your home for 10 days from symptom onset AND greater than 24 hours after symptoms resolution (absence of fever without the use of fever-reducing medication and improvement in respiratory symptoms), whichever is longer Get plenty of rest and push fluids Tessalon Perles prescribed for cough Azithromycin prescribed for possible bronchitis Use medications daily for symptom relief Use OTC medications like ibuprofen or tylenol as needed fever or pain Call or go to the ED if you have any new or worsening symptoms such as fever, worsening cough, shortness of breath, chest tightness, chest pain, turning blue, changes in mental status, etc...   Reviewed expectations re: course of current medical issues. Questions answered. Outlined signs and symptoms indicating need for more acute intervention. Patient verbalized understanding. After Visit Summary given.         Emerson Monte, [place] 09/06/20 1811

## 2020-09-08 LAB — NOVEL CORONAVIRUS, NAA: SARS-CoV-2, NAA: NOT DETECTED

## 2020-09-08 LAB — SARS-COV-2, NAA 2 DAY TAT

## 2020-09-09 ENCOUNTER — Other Ambulatory Visit (INDEPENDENT_AMBULATORY_CARE_PROVIDER_SITE_OTHER): Payer: Self-pay | Admitting: Internal Medicine

## 2020-09-09 ENCOUNTER — Telehealth (INDEPENDENT_AMBULATORY_CARE_PROVIDER_SITE_OTHER): Payer: Self-pay | Admitting: Internal Medicine

## 2020-09-09 MED ORDER — NP THYROID 60 MG PO TABS
120.0000 mg | ORAL_TABLET | Freq: Every day | ORAL | 1 refills | Status: DC
Start: 2020-09-09 — End: 2020-09-09

## 2020-09-09 NOTE — Telephone Encounter (Signed)
Done

## 2020-09-17 DIAGNOSIS — I483 Typical atrial flutter: Secondary | ICD-10-CM

## 2020-09-17 HISTORY — DX: Typical atrial flutter: I48.3

## 2020-09-20 ENCOUNTER — Ambulatory Visit (INDEPENDENT_AMBULATORY_CARE_PROVIDER_SITE_OTHER): Payer: 59 | Admitting: Family Medicine

## 2020-09-20 ENCOUNTER — Encounter: Payer: Self-pay | Admitting: Family Medicine

## 2020-09-20 VITALS — BP 120/100 | HR 150 | Ht 66.0 in | Wt 317.0 lb

## 2020-09-20 DIAGNOSIS — I4819 Other persistent atrial fibrillation: Secondary | ICD-10-CM | POA: Diagnosis not present

## 2020-09-20 NOTE — Patient Instructions (Addendum)
Your physician recommends that you schedule a follow-up appointment in: [place], NP AND 10 DAYS FOR EKG NURSE APPT  Your physician has recommended you make the following change in your medication:   INCREASE DILTIAZEM 240 MG TWICE DAILY FOR 10 DAYS  Your physician recommends that you return for lab work CBC/BMP  Your physician has requested that you have an echocardiogram. Echocardiography is a painless test that uses sound waves to create images of your heart. It provides your doctor with information about the size and shape of your heart and how well your heart's chambers and valves are working. This procedure takes approximately one hour. There are no restrictions for this procedure.  Thank you for choosing [facility]!!

## 2020-09-20 NOTE — Progress Notes (Signed)
Cardiology Office Note  Date: 09/20/2020   ID: Isabella Hall, DOB 25-Sep-1956, MRN [id number]  PCP:  Doree Albee, MD  Cardiologist:  No primary care provider on file. Electrophysiologist:  Thompson Grayer, MD   Chief Complaint: Persistent atrial fibrillation  History of Present Illness: Isabella Hall is a 64 y.o. female with a history of persistent atrial fibrillation, HTN, hypothyroidism, obesity, type II DM.  Last encounter via telemedicine 04/02/2020 with Dr. [name].  He stated she had experienced a 20-minute episode of palpitations 2 days prior.  She was unaware of any other arrhythmias since her ablation.  She denied any chest pain, shortness of breath, lower extremity edema, dizziness, presyncope or syncope.  Her persistent atrial fibrillation was well controlled post ablation off AAD therapy compliant with Xarelto.  CHA2DS2-VASc score of 4.  Hypertension was stable no changes were made.  History of obesity and had gained 20 pounds since her last visit.  History of nonischemic cardiomyopathy.  EF had improved with sinus rhythm.  Plans were to have a follow-up echocardiogram per Dr. Court Joy previous note.  She is here for follow-up.  EKG today shows sinus tachycardia with short PR interval rate of 150 bpm.  She denies any shortness of breath, dizziness, orthopnea, lightheadedness, presyncope or syncopal episodes.  Denies any CVA or TIA-like symptoms.,  PND, orthopnea, lower extremity edema, DVT or PE-like symptoms.  States she does get a little tired when performing household activities.  Past Medical History:  Diagnosis Date  . Atrial fibrillation (HCC)     CHADSVASC score 4  . Atrial flutter ([place])   . Essential hypertension   . Hypothyroidism   . Obesity   . Type 2 diabetes, diet controlled ([place])     Past Surgical History:  Procedure Laterality Date  . APPENDECTOMY  1979  . ATRIAL FIBRILLATION ABLATION  03/26/2018  . ATRIAL FIBRILLATION ABLATION N/A 03/26/2018    Procedure: ATRIAL FIBRILLATION ABLATION;  Surgeon: Thompson Grayer, MD;  Location: [facility];  Service: Cardiovascular;  Laterality: N/A;  . CARDIOVERSION N/A 01/25/2015   Procedure: CARDIOVERSION;  Surgeon: Satira Sark, MD;  Location: [facility];  Service: Cardiovascular;  Laterality: N/A;  . CHOLECYSTECTOMY OPEN  1979  . LEFT HEART CATHETERIZATION WITH CORONARY ANGIOGRAM N/A 01/27/2015   Procedure: LEFT HEART CATHETERIZATION WITH CORONARY ANGIOGRAM;  Surgeon: Sinclair Grooms, MD;  [facility];  Service: Cardiovascular;  Laterality: N/A;  . TEE WITHOUT CARDIOVERSION N/A 01/25/2015   Procedure: TRANSESOPHAGEAL ECHOCARDIOGRAM (TEE);  Surgeon: Satira Sark, MD;  Location: [facility];  Service: Cardiovascular;  Laterality: N/A;  . TUBAL LIGATION  1980    Current Outpatient Medications  Medication Sig Dispense Refill  . blood glucose meter kit and supplies KIT Dispense based on patient and insurance preference. Use up to four times daily as directed. (FOR ICD-9 250.00, 250.01). 1 each 0  . carvedilol (COREG) 25 MG tablet Take 1 tablet (25 mg total) by mouth 2 (two) times daily with a meal. 180 tablet 3  . CINNAMON PO Take 1 capsule by mouth daily.    Marland Kitchen diltiazem (CARDIZEM CD) 240 MG 24 hr capsule Take 1 capsule (240 mg total) by mouth daily. 90 capsule 1  . GARLIC PO Take 1 tablet by mouth daily.    . Multiple Vitamin (MULTIVITAMIN WITH MINERALS) TABS tablet Take 1 tablet by mouth daily.    . NP THYROID 60 MG tablet TAKE 2 TABLETS(120 MG) BY MOUTH DAILY BEFORE  BREAKFAST 180 tablet 0  . XARELTO 20 MG TABS tablet TAKE 1 TABLET BY MOUTH ONCE DAILY WITH SUPPER 30 tablet 6   Current Facility-Administered Medications  Medication Dose Route Frequency Provider Last Rate Last Admin  . progesterone (PROMETRIUM) capsule 200 mg  200 mg Oral Daily Gosrani, Nimish C, MD       Allergies:  Patient has no known allergies.   Social History: The patient  reports that she has never smoked. She  has never used smokeless tobacco. She reports that she does not drink alcohol and does not use drugs.   Family History: The patient's family history includes Cancer in her maternal grandmother and mother; Coronary artery disease in her sister; Heart disease in her father; Hypertension in an other family member.   ROS:  Please see the history of present illness. Otherwise, complete review of systems is positive for none.  All other systems are reviewed and negative.   Physical Exam: VS:  BP (!) 120/100   Pulse (!) 150   Ht $R'5\' 6"'Zd$  (1.676 m)   Wt (!) 317 lb (143.8 kg)   SpO2 98%   BMI 51.17 kg/m , BMI Body mass index is 51.17 kg/m.  Wt Readings from Last 3 Encounters:  09/20/20 (!) 317 lb (143.8 kg)  04/26/20 (!) 301 lb 9.6 oz (136.8 kg)  04/02/20 260 lb (117.9 kg)    General: Morbidly obese patient appears comfortable at rest. Neck: Supple, no elevated JVP or carotid bruits, no thyromegaly. Lungs: Clear to auscultation, nonlabored breathing at rest. Cardiac: Tachycardic rate and rhythm, no S3 or significant systolic murmur, no pericardial rub. Extremities: No pitting edema, distal pulses 2+. Skin: Warm and dry. Musculoskeletal: No kyphosis. Neuropsychiatric: Alert and oriented x3, affect grossly appropriate.  ECG:  An ECG dated 09/20/2020 was personally reviewed today and demonstrated:  Sinus tachycardia rate of 150 with a short PR interval.  Nonspecific intraventricular block.  Recent Labwork: No results found for requested labs within last 8760 hours.     Component Value Date/Time   CHOL 147 01/21/2015 0852   TRIG 61 01/21/2015 0852   HDL 45 01/21/2015 0852   CHOLHDL 3.3 01/21/2015 0852   VLDL 12 01/21/2015 0852   LDLCALC 90 01/21/2015 0852    Other Studies Reviewed Today:  Atrial fibrillation ablation of 03/26/2018 CONCLUSIONS: 1. Atrial fibrillation upon presentation.   2. Intracardiac echo reveals severe biatrial enlargement with four separate pulmonary veins without  evidence of pulmonary vein stenosis. 3. Successful electrical isolation and anatomical encircling of all four pulmonary veins with radiofrequency current.  A WACA approach was used 3. Additional left atrial ablation was performed with a standard box lesion created along the posterior wall of the left atrium 4. Atrial fibrillation successfully cardioverted to sinus rhythm. 5. No early apparent complications.   Echocardiogram 08/07/2017 Study Conclusions   - Left ventricle: The cavity size was normal. Wall thickness was  increased in a pattern of mild LVH. Systolic function was at the  lower limits of normal. The estimated ejection fraction was 50%.  The study was not technically sufficient to allow evaluation of  LV diastolic dysfunction due to atrial fibrillation.  - Mitral valve: There was mild regurgitation.  - Left atrium: The atrium was severely dilated.  - Systemic veins: IVC is dilated with normal respiratory variation.  Estimated CVP 8 mmHg.  Assessment and Plan:   1. Persistent atrial fibrillation (HCC) EKG today shows sinus tachycardia with short PR interval rate of 150, nonspecific intraventricular  block, cannot rule out inferior infarct, age undetermined.  Patient denies any shortness of breath, lightheadedness, dizziness, presyncope or syncopal episodes.  States she was unaware her heart rate was just fast.  She states that her last visit with Dr. [name] after ablation he decreased her diltiazem to 240 mg.  She states she was taking the diltiazem twice a day initially but it was reduced to 240 mg p.o. daily.  Increase Cardizem CD 240 mg p.o. to twice daily x10 days.  Follow-up in 10 days with EKG.  Get a repeat echocardiogram.  Get a CBC and BMP secondary to taking Xarelto.  Continue Xarelto 20 mg p.o. daily.  Follow-up in 1 month.  Patient states her primary care provider increased her thyroid medication to twice daily recently.  She is wondering if increased dose of  thyroid medication may have caused the tachycardia.  Follow-up in 10 days for nursing visit and EKG to re assess heart rate and rhythm  2. Benign essential HTN Blood pressure is elevated today at 120/100.  More than likely due to a rapid heart rate.  Continue carvedilol p.o. twice daily.  We are increasing diltiazem to 42 twice a day for the next 10 days.  Hopefully this will assist in blood pressure control.  Medication Adjustments/Labs and Tests Ordered: Current medicines are reviewed at length with the patient today.  Concerns regarding medicines are outlined above.   Disposition: Follow-up with Dr. Harl Bowie or APP 1 month.  Signed, Levell July, NP 09/20/2020 3:56 PM     [facility] at [place], [place], [place] [address] Phone: [phone]; Fax: [phone]

## 2020-09-27 ENCOUNTER — Other Ambulatory Visit (INDEPENDENT_AMBULATORY_CARE_PROVIDER_SITE_OTHER): Payer: Self-pay | Admitting: Internal Medicine

## 2020-09-27 ENCOUNTER — Other Ambulatory Visit: Payer: Self-pay

## 2020-09-27 ENCOUNTER — Other Ambulatory Visit (INDEPENDENT_AMBULATORY_CARE_PROVIDER_SITE_OTHER): Payer: 59

## 2020-09-27 DIAGNOSIS — I10 Essential (primary) hypertension: Secondary | ICD-10-CM

## 2020-09-27 DIAGNOSIS — E119 Type 2 diabetes mellitus without complications: Secondary | ICD-10-CM

## 2020-09-27 DIAGNOSIS — E039 Hypothyroidism, unspecified: Secondary | ICD-10-CM

## 2020-09-28 ENCOUNTER — Other Ambulatory Visit (INDEPENDENT_AMBULATORY_CARE_PROVIDER_SITE_OTHER): Payer: Self-pay | Admitting: Nurse Practitioner

## 2020-09-28 DIAGNOSIS — N1832 Chronic kidney disease, stage 3b: Secondary | ICD-10-CM

## 2020-09-28 LAB — HEMOGLOBIN A1C
Hgb A1c MFr Bld: 7.6 % of total Hgb — ABNORMAL HIGH (ref <5.7)
Mean Plasma Glucose: 171 (calc)
eAG (mmol/L): 9.5 (calc)

## 2020-09-28 LAB — CBC
HCT: 38.8 % (ref 35.0–45.0)
Hemoglobin: 12.5 g/dL (ref 11.7–15.5)
MCH: 27.9 pg (ref 27.0–33.0)
MCHC: 32.2 g/dL (ref 32.0–36.0)
MCV: 86.6 fL (ref 80.0–100.0)
MPV: 12.1 fL (ref 7.5–12.5)
Platelets: 163 10*3/uL (ref 140–400)
RBC: 4.48 10*6/uL (ref 3.80–5.10)
RDW: 13.6 % (ref 11.0–15.0)
WBC: 5.8 10*3/uL (ref 3.8–10.8)

## 2020-09-28 LAB — COMPLETE METABOLIC PANEL WITH GFR
AG Ratio: 1.3 (calc) (ref 1.0–2.5)
ALT: 17 U/L (ref 6–29)
AST: 14 U/L (ref 10–35)
Albumin: 3.9 g/dL (ref 3.6–5.1)
Alkaline phosphatase (APISO): 41 U/L (ref 37–153)
BUN/Creatinine Ratio: 19 (calc) (ref 6–22)
BUN: 33 mg/dL — ABNORMAL HIGH (ref 7–25)
CO2: 30 mmol/L (ref 20–32)
Calcium: 9.3 mg/dL (ref 8.6–10.4)
Chloride: 103 mmol/L (ref 98–110)
Creat: 1.77 mg/dL — ABNORMAL HIGH (ref 0.50–0.99)
GFR, Est African American: 35 mL/min/{1.73_m2} — ABNORMAL LOW (ref >=60)
GFR, Est Non African American: 30 mL/min/{1.73_m2} — ABNORMAL LOW (ref >=60)
Globulin: 2.9 g/dL (calc) (ref 1.9–3.7)
Glucose, Bld: 210 mg/dL — ABNORMAL HIGH (ref 65–99)
Potassium: 4.4 mmol/L (ref 3.5–5.3)
Sodium: 141 mmol/L (ref 135–146)
Total Bilirubin: 0.6 mg/dL (ref 0.2–1.2)
Total Protein: 6.8 g/dL (ref 6.1–8.1)

## 2020-09-28 LAB — TSH: TSH: 55.47 mIU/L — ABNORMAL HIGH (ref 0.40–4.50)

## 2020-09-28 LAB — LIPID PANEL
Cholesterol: 185 mg/dL (ref <200)
HDL: 50 mg/dL (ref >=50)
LDL Cholesterol (Calc): 112 mg/dL (calc) — ABNORMAL HIGH
Non-HDL Cholesterol (Calc): 135 mg/dL (calc) — ABNORMAL HIGH (ref <130)
Total CHOL/HDL Ratio: 3.7 (calc) (ref <5.0)
Triglycerides: 119 mg/dL (ref <150)

## 2020-09-28 LAB — T3, FREE: T3, Free: 5.6 pg/mL — ABNORMAL HIGH (ref 2.3–4.2)

## 2020-09-28 LAB — T4: T4, Total: 6 ug/dL (ref 5.1–11.9)

## 2020-09-30 ENCOUNTER — Ambulatory Visit (INDEPENDENT_AMBULATORY_CARE_PROVIDER_SITE_OTHER): Payer: 59 | Admitting: Nurse Practitioner

## 2020-09-30 ENCOUNTER — Other Ambulatory Visit: Payer: Self-pay

## 2020-09-30 VITALS — BP 170/100 | HR 144 | Temp 96.4°F | Ht 66.0 in | Wt 311.6 lb

## 2020-09-30 DIAGNOSIS — R000 Tachycardia, unspecified: Secondary | ICD-10-CM

## 2020-09-30 DIAGNOSIS — N1832 Chronic kidney disease, stage 3b: Secondary | ICD-10-CM | POA: Diagnosis not present

## 2020-09-30 DIAGNOSIS — I10 Essential (primary) hypertension: Secondary | ICD-10-CM | POA: Diagnosis not present

## 2020-09-30 DIAGNOSIS — E039 Hypothyroidism, unspecified: Secondary | ICD-10-CM | POA: Diagnosis not present

## 2020-09-30 DIAGNOSIS — E1165 Type 2 diabetes mellitus with hyperglycemia: Secondary | ICD-10-CM

## 2020-09-30 MED ORDER — LEVOTHYROXINE SODIUM 150 MCG PO TABS: 150.0000 ug | ORAL_TABLET | Freq: Every day | ORAL | 0 refills | Status: DC

## 2020-09-30 NOTE — Progress Notes (Addendum)
Subjective:  Patient ID: Isabella Hall, female    DOB: 03/22/1956  Age: 64 y.o. MRN: [id number]  CC:  Chief Complaint  Patient presents with  . Chronic Kidney Disease  . Hypothyroidism  . Hypertension  . Diabetes      HPI  This patient arrives today for the above.  Chronic kidney disease: Blood work was collected earlier this week and did show that her chronic kidney disease actually worsened.  She tells me she has recently just started getting over a bout of bronchitis and feel that she may be slightly dehydrated.  I have referred her to nephrology to make sure you are optimizing treatment to manage her kidney failure.  She continues to drink about 64 ounces of water daily.  She avoids caffeine, sodas, and juice.  Hypothyroidism: She continues on NP thyroid 120 mg daily.  Last panel showed TSH of 55 and T3 of 5.4.  Is tolerating medication well, but tells me that her heart rate been quite elevated.  She saw her cardiologist earlier this week and noted she was in sinus tachycardia.  She tells me that she is mostly asymptomatic except has been feeling some fatigue intermittently.  She is scheduled to see her cardiologist again next week and to undergo echocardiogram, as well as to follow-up with the physician that completed her ablation for treatment of her atrial fibrillation in the past.  Hypertension: She continues on her antihypertensives which include Coreg and diltiazem.  Diabetes: Last A1c was collected earlier this week and was 7.6.  The patient is not any medications to control her diabetes.  She is actively trying to lose weight and change her lifestyle.  She tells me she has been eating much healthier the last couple weeks and has lost about 5 pounds.  Past Medical History:  Diagnosis Date  . Atrial fibrillation (HCC)     CHADSVASC score 4  . Atrial flutter ([place])   . Essential hypertension   . Hypothyroidism   . Obesity   . Type 2 diabetes, diet controlled ([place])        Family History  Problem Relation Age of Onset  . Cancer Mother   . Heart disease Father   . Hypertension Other   . Coronary artery disease Sister        died of an MI in her 6's  . Cancer Maternal Grandmother   . Atrial fibrillation Neg Hx   . Diabetes Neg Hx     Social History   Social History Narrative   Divorced for 6 years,was married 12 years.Lives alone.Works as Corporate treasurer for [employer] .   Social History   Tobacco Use  . Smoking status: Never Smoker  . Smokeless tobacco: Never Used  Substance Use Topics  . Alcohol use: No    Alcohol/week: 0.0 standard drinks     Current Meds  Medication Sig  . Capsicum, Cayenne, (CAYENNE PO) Take by mouth.  . Ginger, Zingiber officinalis, (GINGER EXTRACT PO) Take by mouth.  . Multiple Vitamins-Minerals (EMERGEN-C IMMUNE PO) Take by mouth.  . Turmeric 500 MG CAPS Take by mouth.   Current Facility-Administered Medications for the 09/30/20 encounter (Office Visit) with Ailene Ards, NP  Medication  . progesterone (PROMETRIUM) capsule 200 mg    ROS:  Review of Systems  Constitutional: Positive for malaise/fatigue.  Eyes: Negative.   Respiratory: Negative.   Cardiovascular: Negative.   Neurological: Negative.   Psychiatric/Behavioral: Negative.      Objective:  Today's Vitals: BP (!) 170/100   Pulse (!) 144   Temp (!) 96.4 F (35.8 C)   Ht 5\' 6"  (1.676 m)   Wt (!) 311 lb 9.6 oz (141.3 kg)   SpO2 95%   BMI 50.29 kg/m  Vitals with BMI 09/30/2020 09/20/2020 09/06/2020  Height 5\' 6"  5\' 6"  -  Weight 311 lbs 10 oz 317 lbs -  BMI 63.14 97.02 -  Systolic 637 858 850  Diastolic 277 412 53  Pulse 144 150 73     Physical Exam Vitals reviewed.  Constitutional:      General: She is not in acute distress.    Appearance: Normal appearance.  HENT:     Head: Normocephalic and atraumatic.  Neck:     Vascular: No carotid bruit.  Cardiovascular:     Rate and Rhythm: Regular rhythm. Tachycardia present.     Pulses:  Normal pulses.     Heart sounds: Normal heart sounds.  Pulmonary:     Effort: Pulmonary effort is normal.     Breath sounds: Normal breath sounds.  Skin:    General: Skin is warm and dry.  Neurological:     General: No focal deficit present.     Mental Status: She is alert and oriented to person, place, and time.  Psychiatric:        Mood and Affect: Mood normal.        Behavior: Behavior normal.        Judgment: Judgment normal.     EKG: Sinus tachycardia (reviewed by Dr. Anastasio Champion)     Assessment and Plan   1. Hypothyroidism, unspecified type   2. Benign essential HTN   3. Stage 3b chronic kidney disease ([place])   4. Controlled type 2 diabetes mellitus with hyperglycemia, without long-term current use of insulin ([place])   5. Tachycardia      Plan: 1.,  2., 5.  Per chart review I do see that patient was evaluated by her cardiologist's office a few days ago, and was found to tachycardic at that time as well.  She appears to remain asymptomatic at this time and continues to deny any chest pain, shortness of breath, dizziness, near-syncope, etc.  I did consult with my supervising physician (Dr. Anastasio Champion), and per shared decision making with the patient we have decided to stop her NP thyroid and change her back to levothyroxine.  We will put her on 150 mcg initially this is the equivalent of 90 mg of NP thyroid which is a reduction in dose of total thyroid hormone.  This will hopefully result in a better controlled heart rate as well as blood pressure.  She will follow-up with her cardiologist as scheduled next week.  She was counseled on signs and symptoms of MI (chest pain, shortness of breath, worsening fatigue, etc.), and if these were to occur she needs to call 911.  She tells me she understands. She is scheduled to follow-up with her cardiologist next week and is agreeable to keeping this appointment. 3.  She will follow up with nephrology make sure we are optimizing treatment of her  chronic kidney disease. 4.  She would prefer to focus on lifestyle as opposed to starting medication today.  She was encouraged to eat a mostly plant-based diet and avoid processed carbohydrates and sweets.   Tests ordered No orders of the defined types were placed in this encounter.     Meds ordered this encounter  Medications  . levothyroxine (SYNTHROID) 150 MCG  tablet    Sig: Take 1 tablet (150 mcg total) by mouth daily.    Dispense:  90 tablet    Refill:  0    Order Specific Question:   Supervising Provider    Answer:   Doree Albee [[id number]]    Patient to follow-up in 4 to 6 weeks, or sooner as needed.  Ailene Ards, NP

## 2020-10-01 ENCOUNTER — Telehealth (INDEPENDENT_AMBULATORY_CARE_PROVIDER_SITE_OTHER): Payer: Self-pay | Admitting: Nurse Practitioner

## 2020-10-01 ENCOUNTER — Ambulatory Visit: Payer: 59

## 2020-10-01 NOTE — Telephone Encounter (Signed)
I called this patient today to check in and see how she was feeling.  She tells me that she is feeling better, and has an at home pulse oximeter.  She tells me her readings for heart rate have been in the 80s and up to 106 throughout the day today.  She also mention that she checked her blood pressure on her at home blood pressure cuff and it was "normal". She tells me her oxygen reading is 98% or greater.  She is encouraged to follow-up with her cardiologist next week as scheduled.

## 2020-10-06 ENCOUNTER — Other Ambulatory Visit: Payer: Self-pay | Admitting: Internal Medicine

## 2020-10-06 ENCOUNTER — Ambulatory Visit: Payer: 59

## 2020-10-11 ENCOUNTER — Telehealth: Payer: Self-pay | Admitting: Family Medicine

## 2020-10-11 NOTE — Telephone Encounter (Signed)
Since having the medication change on the diltiazem (CARDIZEM CD) 240 MG 24 hr capsule [[id number]]  She's ran out of the medication and is needing a refill sent to [facility]

## 2020-10-11 NOTE — Telephone Encounter (Signed)
Refill sent on 10/21 to [facility]

## 2020-10-14 ENCOUNTER — Ambulatory Visit: Payer: 59

## 2020-10-14 ENCOUNTER — Ambulatory Visit (INDEPENDENT_AMBULATORY_CARE_PROVIDER_SITE_OTHER): Payer: 59 | Admitting: *Deleted

## 2020-10-14 DIAGNOSIS — I4819 Other persistent atrial fibrillation: Secondary | ICD-10-CM | POA: Diagnosis not present

## 2020-10-14 MED ORDER — DILTIAZEM HCL ER COATED BEADS 360 MG PO CP24: 360.0000 mg | ORAL_CAPSULE | Freq: Every day | ORAL | 3 refills | Status: DC

## 2020-10-14 NOTE — Progress Notes (Signed)
Please increase Cardizem CD to 360 mg daily.  Thank you

## 2020-10-14 NOTE — Progress Notes (Signed)
Pt here for EKG with recent increase of diltiazem 240 mg bid for 10 days then 240 mg daily - EKG was reviewed by Katina Dung, NP HR 135 on EKG (canceled echo scheduled for today with elevated HR) and per Katina Dung, NP will increase dilt to 360 mg daily - pt has f/u with Dr [name] next Friday 11/5 and will cx appt with Andy 11/4

## 2020-10-21 ENCOUNTER — Ambulatory Visit: Payer: 59 | Admitting: Family Medicine

## 2020-10-22 ENCOUNTER — Telehealth: Payer: Self-pay | Admitting: Internal Medicine

## 2020-10-22 ENCOUNTER — Encounter: Payer: Self-pay | Admitting: Internal Medicine

## 2020-10-22 ENCOUNTER — Telehealth: Payer: Self-pay | Admitting: *Deleted

## 2020-10-22 ENCOUNTER — Encounter: Payer: Self-pay | Admitting: *Deleted

## 2020-10-22 ENCOUNTER — Telehealth (INDEPENDENT_AMBULATORY_CARE_PROVIDER_SITE_OTHER): Payer: 59 | Admitting: Internal Medicine

## 2020-10-22 VITALS — BP 116/93 | HR 136 | Ht 66.0 in | Wt 305.0 lb

## 2020-10-22 DIAGNOSIS — I4819 Other persistent atrial fibrillation: Secondary | ICD-10-CM

## 2020-10-22 DIAGNOSIS — I10 Essential (primary) hypertension: Secondary | ICD-10-CM

## 2020-10-22 DIAGNOSIS — I483 Typical atrial flutter: Secondary | ICD-10-CM | POA: Diagnosis not present

## 2020-10-22 DIAGNOSIS — D6869 Other thrombophilia: Secondary | ICD-10-CM

## 2020-10-22 NOTE — Telephone Encounter (Signed)
DCCV instructions read and reviewed with patient. Verbalized understanding of plan.

## 2020-10-22 NOTE — Telephone Encounter (Signed)
New message    Patient returning a call to [facility]

## 2020-10-22 NOTE — Telephone Encounter (Signed)
°  Patient Consent for Virtual Visit         Isabella Hall has provided verbal consent on 10/22/2020 for a virtual visit (video or telephone).   CONSENT FOR VIRTUAL VISIT FOR:  Isabella Hall  By participating in this virtual visit I agree to the following:  I hereby voluntarily request, consent and authorize [facility] and its employed or contracted physicians, physician assistants, nurse practitioners or other licensed health care professionals (the Practitioner), to provide me with telemedicine health care services (the Services") as deemed necessary by the treating Practitioner. I acknowledge and consent to receive the Services by the Practitioner via telemedicine. I understand that the telemedicine visit will involve communicating with the Practitioner through live audiovisual communication technology and the disclosure of certain medical information by electronic transmission. I acknowledge that I have been given the opportunity to request an in-person assessment or other available alternative prior to the telemedicine visit and am voluntarily participating in the telemedicine visit.  I understand that I have the right to withhold or withdraw my consent to the use of telemedicine in the course of my care at any time, without affecting my right to future care or treatment, and that the Practitioner or I may terminate the telemedicine visit at any time. I understand that I have the right to inspect all information obtained and/or recorded in the course of the telemedicine visit and may receive copies of available information for a reasonable fee.  I understand that some of the potential risks of receiving the Services via telemedicine include:   Delay or interruption in medical evaluation due to technological equipment failure or disruption;  Information transmitted may not be sufficient (e.g. poor resolution of images) to allow for appropriate medical decision making by the Practitioner;  and/or   In rare instances, security protocols could fail, causing a breach of personal health information.  Furthermore, I acknowledge that it is my responsibility to provide information about my medical history, conditions and care that is complete and accurate to the best of my ability. I acknowledge that Practitioner's advice, recommendations, and/or decision may be based on factors not within their control, such as incomplete or inaccurate data provided by me or distortions of diagnostic images or specimens that may result from electronic transmissions. I understand that the practice of medicine is not an exact science and that Practitioner makes no warranties or guarantees regarding treatment outcomes. I acknowledge that a copy of this consent can be made available to me via my patient portal ([place]), or I can request a printed copy by calling the office of [facility].    I understand that my insurance will be billed for this visit.   I have read or had this consent read to me.  I understand the contents of this consent, which adequately explains the benefits and risks of the Services being provided via telemedicine.   I have been provided ample opportunity to ask questions regarding this consent and the Services and have had my questions answered to my satisfaction.  I give my informed consent for the services to be provided through the use of telemedicine in my medical care

## 2020-10-22 NOTE — H&P (View-Only) (Signed)
Electrophysiology TeleHealth Note  Due to national recommendations of social distancing due to [place] 19, an audio telehealth visit is felt to be most appropriate for this patient at this time.  Verbal consent was obtained by me for the telehealth visit today.  The patient does not have capability for a virtual visit.  A phone visit is therefore required today.   Date:  10/22/2020   ID:  Isabella Hall, DOB 06/02/1956, MRN [id number]  Location: patient's home  Provider location:  [place]  Evaluation Performed: Follow-up visit  PCP:  Doree Albee, MD   Electrophysiologist:  Dr [name]  Chief Complaint:  palpitations  History of Present Illness:    Isabella Hall is a 64 y.o. female who presents via telehealth conferencing today.  Since last being seen in our clinic, the patient reports doing very well. Unfortunately, she has developed atrial flutter.  She has symptoms of rapid palpitations, fatigue and SOB.   Today, she denies symptoms of chest pain, lower extremity edema, dizziness, presyncope, or syncope.  The patient is otherwise without complaint today.     Past Medical History:  Diagnosis Date  . Atrial fibrillation (HCC)     CHADSVASC score 4  . Atrial flutter ([place])   . Essential hypertension   . Hypothyroidism   . Obesity   . Type 2 diabetes, diet controlled ([place])     Past Surgical History:  Procedure Laterality Date  . APPENDECTOMY  1979  . ATRIAL FIBRILLATION ABLATION  03/26/2018  . ATRIAL FIBRILLATION ABLATION N/A 03/26/2018   Procedure: ATRIAL FIBRILLATION ABLATION;  Surgeon: Thompson Grayer, MD;  Location: [place] [facility];  Service: Cardiovascular;  Laterality: N/A;  . CARDIOVERSION N/A 01/25/2015   Procedure: CARDIOVERSION;  Surgeon: Satira Sark, MD;  Location: [facility];  Service: Cardiovascular;  Laterality: N/A;  . CHOLECYSTECTOMY OPEN  1979  . LEFT HEART CATHETERIZATION WITH CORONARY ANGIOGRAM N/A 01/27/2015   Procedure: LEFT HEART  CATHETERIZATION WITH CORONARY ANGIOGRAM;  Surgeon: Sinclair Grooms, MD;  Location: [facility] CATH LAB;  Service: Cardiovascular;  Laterality: N/A;  . TEE WITHOUT CARDIOVERSION N/A 01/25/2015   Procedure: TRANSESOPHAGEAL ECHOCARDIOGRAM (TEE);  Surgeon: Satira Sark, MD;  Location: [facility];  Service: Cardiovascular;  Laterality: N/A;  . TUBAL LIGATION  1980    Current Outpatient Medications  Medication Sig Dispense Refill  . blood glucose meter kit and supplies KIT Dispense based on patient and insurance preference. Use up to four times daily as directed. (FOR ICD-9 250.00, 250.01). 1 each 0  . Capsicum, Cayenne, (CAYENNE PO) Take by mouth.    . carvedilol (COREG) 25 MG tablet Take 1 tablet (25 mg total) by mouth 2 (two) times daily with a meal. 180 tablet 3  . CINNAMON PO Take 1 capsule by mouth daily.    Marland Kitchen diltiazem (CARDIZEM CD) 360 MG 24 hr capsule Take 1 capsule (360 mg total) by mouth daily. 30 capsule 3  . GARLIC PO Take 1 tablet by mouth daily.    . Ginger, Zingiber officinalis, (GINGER EXTRACT PO) Take by mouth.    . levothyroxine (SYNTHROID) 150 MCG tablet Take 1 tablet (150 mcg total) by mouth daily. 90 tablet 0  . Multiple Vitamin (MULTIVITAMIN WITH MINERALS) TABS tablet Take 1 tablet by mouth daily.    . Multiple Vitamins-Minerals (EMERGEN-C IMMUNE PO) Take by mouth.    . Turmeric 500 MG CAPS Take by mouth.    Alveda Reasons 20 MG TABS tablet TAKE  1 TABLET BY MOUTH ONCE DAILY WITH SUPPER 30 tablet 6   Current Facility-Administered Medications  Medication Dose Route Frequency Provider Last Rate Last Admin  . progesterone (PROMETRIUM) capsule 200 mg  200 mg Oral Daily Gosrani, Nimish C, MD        Allergies:   Patient has no known allergies.   Social History:  The patient  reports that she has never smoked. She has never used smokeless tobacco. She reports that she does not drink alcohol and does not use drugs.   ROS:  Please see the history of present illness.   All other systems are  personally reviewed and negative.    Exam:    Vital Signs:  BP (!) 116/93   Pulse (!) 136   Ht 5' 6" (1.676 m)   Wt (!) 305 lb (138.3 kg)   BMI 49.23 kg/m   Well sounding, alert and conversant   Labs/Other Tests and Data Reviewed:    Recent Labs: 09/27/2020: ALT 17; BUN 33; Creat 1.77; Hemoglobin 12.5; Platelets 163; Potassium 4.4; Sodium 141; TSH 55.47   Wt Readings from Last 3 Encounters:  10/22/20 (!) 305 lb (138.3 kg)  09/30/20 (!) 311 lb 9.6 oz (141.3 kg)  09/20/20 (!) 317 lb (143.8 kg)      ASSESSMENT & PLAN:    1.  Persistent afib She has done very well post ablation Unfortunately, she is now in typical atrial flutter and quite symptomatic (confirmed by EKG 10/14/20).  I would advise cardioversion.  If she has recurrence we could consider ablation. We discussed lifestyle modification at length today Risks of cardioversion including risks of anesthesia and stroke were discussed today.  She wishes to proceed.  She reports compliance with xarelto.    2. Morbid obesity Body mass index is 49.23 kg/m. Lifestyle modification again advised  3. HTN Stable No change required today  4. Nonischemic CM EF previously recovered with sinus  Risks, benefits and potential toxicities for medications prescribed and/or refilled reviewed with patient today.   Follow-up: afib clinic post cardioversion I will see in 2 months  Patient Risk:  after full review of this patients clinical status, I feel that they are at moderate risk at this time.  Today, I have spent 15 minutes with the patient with telehealth technology discussing arrhythmia management .    Signed, Rakiyah Esch, MD  10/22/2020 9:14 AM     [facility] [address] [place] [address] [phone] (office) [phone] (fax)  

## 2020-10-22 NOTE — Progress Notes (Signed)
Electrophysiology TeleHealth Note  Due to national recommendations of social distancing due to [place] 19, an audio telehealth visit is felt to be most appropriate for this patient at this time.  Verbal consent was obtained by me for the telehealth visit today.  The patient does not have capability for a virtual visit.  A phone visit is therefore required today.   Date:  10/22/2020   ID:  Isabella Hall, DOB 02-22-1956, MRN [id number]  Location: patient's home  Provider location:  [place]  Evaluation Performed: Follow-up visit  PCP:  Doree Albee, MD   Electrophysiologist:  Dr [name]  Chief Complaint:  palpitations  History of Present Illness:    Isabella Hall is a 64 y.o. female who presents via telehealth conferencing today.  Since last being seen in our clinic, the patient reports doing very well. Unfortunately, she has developed atrial flutter.  She has symptoms of rapid palpitations, fatigue and SOB.   Today, she denies symptoms of chest pain, lower extremity edema, dizziness, presyncope, or syncope.  The patient is otherwise without complaint today.     Past Medical History:  Diagnosis Date  . Atrial fibrillation (HCC)     CHADSVASC score 4  . Atrial flutter ([place])   . Essential hypertension   . Hypothyroidism   . Obesity   . Type 2 diabetes, diet controlled ([place])     Past Surgical History:  Procedure Laterality Date  . APPENDECTOMY  1979  . ATRIAL FIBRILLATION ABLATION  03/26/2018  . ATRIAL FIBRILLATION ABLATION N/A 03/26/2018   Procedure: ATRIAL FIBRILLATION ABLATION;  Surgeon: Thompson Grayer, MD;  Location: [place] [facility];  Service: Cardiovascular;  Laterality: N/A;  . CARDIOVERSION N/A 01/25/2015   Procedure: CARDIOVERSION;  Surgeon: Satira Sark, MD;  Location: [facility];  Service: Cardiovascular;  Laterality: N/A;  . CHOLECYSTECTOMY OPEN  1979  . LEFT HEART CATHETERIZATION WITH CORONARY ANGIOGRAM N/A 01/27/2015   Procedure: LEFT HEART  CATHETERIZATION WITH CORONARY ANGIOGRAM;  Surgeon: Sinclair Grooms, MD;  Location: [facility] CATH LAB;  Service: Cardiovascular;  Laterality: N/A;  . TEE WITHOUT CARDIOVERSION N/A 01/25/2015   Procedure: TRANSESOPHAGEAL ECHOCARDIOGRAM (TEE);  Surgeon: Satira Sark, MD;  Location: [facility];  Service: Cardiovascular;  Laterality: N/A;  . TUBAL LIGATION  1980    Current Outpatient Medications  Medication Sig Dispense Refill  . blood glucose meter kit and supplies KIT Dispense based on patient and insurance preference. Use up to four times daily as directed. (FOR ICD-9 250.00, 250.01). 1 each 0  . Capsicum, Cayenne, (CAYENNE PO) Take by mouth.    . carvedilol (COREG) 25 MG tablet Take 1 tablet (25 mg total) by mouth 2 (two) times daily with a meal. 180 tablet 3  . CINNAMON PO Take 1 capsule by mouth daily.    Marland Kitchen diltiazem (CARDIZEM CD) 360 MG 24 hr capsule Take 1 capsule (360 mg total) by mouth daily. 30 capsule 3  . GARLIC PO Take 1 tablet by mouth daily.    . Ginger, Zingiber officinalis, (GINGER EXTRACT PO) Take by mouth.    . levothyroxine (SYNTHROID) 150 MCG tablet Take 1 tablet (150 mcg total) by mouth daily. 90 tablet 0  . Multiple Vitamin (MULTIVITAMIN WITH MINERALS) TABS tablet Take 1 tablet by mouth daily.    . Multiple Vitamins-Minerals (EMERGEN-C IMMUNE PO) Take by mouth.    . Turmeric 500 MG CAPS Take by mouth.    Alveda Reasons 20 MG TABS tablet TAKE  1 TABLET BY MOUTH ONCE DAILY WITH SUPPER 30 tablet 6   Current Facility-Administered Medications  Medication Dose Route Frequency Provider Last Rate Last Admin  . progesterone (PROMETRIUM) capsule 200 mg  200 mg Oral Daily Gosrani, Nimish C, MD        Allergies:   Patient has no known allergies.   Social History:  The patient  reports that she has never smoked. She has never used smokeless tobacco. She reports that she does not drink alcohol and does not use drugs.   ROS:  Please see the history of present illness.   All other systems are  personally reviewed and negative.    Exam:    Vital Signs:  BP (!) 116/93   Pulse (!) 136   Ht 5' 6" (1.676 m)   Wt (!) 305 lb (138.3 kg)   BMI 49.23 kg/m   Well sounding, alert and conversant   Labs/Other Tests and Data Reviewed:    Recent Labs: 09/27/2020: ALT 17; BUN 33; Creat 1.77; Hemoglobin 12.5; Platelets 163; Potassium 4.4; Sodium 141; TSH 55.47   Wt Readings from Last 3 Encounters:  10/22/20 (!) 305 lb (138.3 kg)  09/30/20 (!) 311 lb 9.6 oz (141.3 kg)  09/20/20 (!) 317 lb (143.8 kg)      ASSESSMENT & PLAN:    1.  Persistent afib She has done very well post ablation Unfortunately, she is now in typical atrial flutter and quite symptomatic (confirmed by EKG 10/14/20).  I would advise cardioversion.  If she has recurrence we could consider ablation. We discussed lifestyle modification at length today Risks of cardioversion including risks of anesthesia and stroke were discussed today.  She wishes to proceed.  She reports compliance with xarelto.    2. Morbid obesity Body mass index is 49.23 kg/m. Lifestyle modification again advised  3. HTN Stable No change required today  4. Nonischemic CM EF previously recovered with sinus  Risks, benefits and potential toxicities for medications prescribed and/or refilled reviewed with patient today.   Follow-up: afib clinic post cardioversion I will see in 2 months  Patient Risk:  after full review of this patients clinical status, I feel that they are at moderate risk at this time.  Today, I have spent 15 minutes with the patient with telehealth technology discussing arrhythmia management .    Army Fossa, MD  10/22/2020 9:14 AM     [facility] [address] [place] [address] [phone] (office) [phone] (fax)

## 2020-10-22 NOTE — Patient Instructions (Addendum)
Medication Instructions:    Your physician recommends that you continue on your current medications as directed. Please refer to the Current Medication list given to you today.  Labwork:  See instructions for cardioversion  Testing/Procedures: Your physician has recommended that you have a Cardioversion (DCCV). Electrical Cardioversion uses a jolt of electricity to your heart either through paddles or wired patches attached to your chest. This is a controlled, usually prescheduled, procedure. Defibrillation is done under light anesthesia in the hospital, and you usually go home the day of the procedure. This is done to get your heart back into a normal rhythm. You are not awake for the procedure. Please see the instruction sheet given to you today found in letters.  Follow-Up:  Your physician recommends that you schedule a follow-up appointment in: 2 months with Dr. [name]  Your physician recommends that you schedule a follow-up appointment in: 1 week after your cardioversion  Any Other Special Instructions Will Be Listed Below (If Applicable).  Pre-Admission and Covid test are scheduled for Tuesday, 10/26/2020 @2 :45 pm at [facility] enter the main entrance of the hospital on that day.  If you need a refill on your cardiac medications before your next appointment, please call your pharmacy.

## 2020-10-22 NOTE — Telephone Encounter (Signed)
Pre-cert Verification for the following procedure    DCCV dx: a-fib/a-flutter 10/28/2020 @9 :30 am with Dr. Domenic Polite at [facility]

## 2020-10-25 NOTE — Patient Instructions (Signed)
Isabella Hall  10/25/2020     @PREFPERIOPPHARMACY @   Your procedure is scheduled on  10/28/2020.  Report to [facility] at  0800  A.M.  Call this number if you have problems the morning of surgery:  [phone]   Remember:  Do not eat or drink after midnight.                         Take these medicines the morning of surgery with A SIP OF WATER  Levothyroxine. DO NOT miss any doses of your eliquis before your procedure.     Do not wear jewelry, make-up or nail polish.  Do not wear lotions, powders, or perfumes. Please wear deodorant and brush your teeth.  Do not shave 48 hours prior to surgery.  Men may shave face and neck.  Do not bring valuables to the hospital.  [facility] is not responsible for any belongings or valuables.  Contacts, dentures or bridgework may not be worn into surgery.  Leave your suitcase in the car.  After surgery it may be brought to your room.  For patients admitted to the hospital, discharge time will be determined by your treatment team.  Patients discharged the day of surgery will not be allowed to drive home.   Name and phone number of your driver:   family Special instructions:   DO NOT smoke the morning of your procedure.  Please read over the following fact sheets that you were given. Anesthesia Post-op Instructions and Care and Recovery After Surgery       Electrical Cardioversion Electrical cardioversion is the delivery of a jolt of electricity to restore a normal rhythm to the heart. A rhythm that is too fast or is not regular keeps the heart from pumping well. In this procedure, sticky patches or metal paddles are placed on the chest to deliver electricity to the heart from a device. This procedure may be done in an emergency if:  There is low or no blood pressure as a result of the heart rhythm.  Normal rhythm must be restored as fast as possible to protect the brain and heart from further damage.  It may save a  life. This may also be a scheduled procedure for irregular or fast heart rhythms that are not immediately life-threatening. Tell a health care provider about:  Any allergies you have.  All medicines you are taking, including vitamins, herbs, eye drops, creams, and over-the-counter medicines.  Any problems you or family members have had with anesthetic medicines.  Any blood disorders you have.  Any surgeries you have had.  Any medical conditions you have.  Whether you are pregnant or may be pregnant. What are the risks? Generally, this is a safe procedure. However, problems may occur, including:  Allergic reactions to medicines.  A blood clot that breaks free and travels to other parts of your body.  The possible return of an abnormal heart rhythm within hours or days after the procedure.  Your heart stopping (cardiac arrest). This is rare. What happens before the procedure? Medicines  Your health care provider may have you start taking: ? Blood-thinning medicines (anticoagulants) so your blood does not clot as easily. ? Medicines to help stabilize your heart rate and rhythm.  Ask your health care provider about: ? Changing or stopping your regular medicines. This is especially important if you are taking diabetes medicines or blood thinners. ? Taking medicines such  as aspirin and ibuprofen. These medicines can thin your blood. Do not take these medicines unless your health care provider tells you to take them. ? Taking over-the-counter medicines, vitamins, herbs, and supplements. General instructions  Follow instructions from your health care provider about eating or drinking restrictions.  Plan to have someone take you home from the hospital or clinic.  If you will be going home right after the procedure, plan to have someone with you for 24 hours.  Ask your health care provider what steps will be taken to help prevent infection. These may include washing your skin with  a germ-killing soap. What happens during the procedure?   An IV will be inserted into one of your veins.  Sticky patches (electrodes) or metal paddles may be placed on your chest.  You will be given a medicine to help you relax (sedative).  An electrical shock will be delivered. The procedure may vary among health care providers and hospitals. What can I expect after the procedure?  Your blood pressure, heart rate, breathing rate, and blood oxygen level will be monitored until you leave the hospital or clinic.  Your heart rhythm will be watched to make sure it does not change.  You may have some redness on the skin where the shocks were given. Follow these instructions at home:  Do not drive for 24 hours if you were given a sedative during your procedure.  Take over-the-counter and prescription medicines only as told by your health care provider.  Ask your health care provider how to check your pulse. Check it often.  Rest for 48 hours after the procedure or as told by your health care provider.  Avoid or limit your caffeine use as told by your health care provider.  Keep all follow-up visits as told by your health care provider. This is important. Contact a health care provider if:  You feel like your heart is beating too quickly or your pulse is not regular.  You have a serious muscle cramp that does not go away. Get help right away if:  You have discomfort in your chest.  You are dizzy or you feel faint.  You have trouble breathing or you are short of breath.  Your speech is slurred.  You have trouble moving an arm or leg on one side of your body.  Your fingers or toes turn cold or blue. Summary  Electrical cardioversion is the delivery of a jolt of electricity to restore a normal rhythm to the heart.  This procedure may be done right away in an emergency or may be a scheduled procedure if the condition is not an emergency.  Generally, this is a safe  procedure.  After the procedure, check your pulse often as told by your health care provider. This information is not intended to replace advice given to you by your health care provider. Make sure you discuss any questions you have with your health care provider. Document Revised: 07/07/2019 Document Reviewed: 07/07/2019 Elsevier Patient Education  [place] After These instructions provide you with information about caring for yourself after your procedure. Your health care provider may also give you more specific instructions. Your treatment has been planned according to current medical practices, but problems sometimes occur. Call your health care provider if you have any problems or questions after your procedure. What can I expect after the procedure? After your procedure, you may:  Feel sleepy for several hours.  Feel clumsy and  have poor balance for several hours.  Feel forgetful about what happened after the procedure.  Have poor judgment for several hours.  Feel nauseous or vomit.  Have a sore throat if you had a breathing tube during the procedure. Follow these instructions at home: For at least 24 hours after the procedure:      Have a responsible adult stay with you. It is important to have someone help care for you until you are awake and alert.  Rest as needed.  Do not: ? Participate in activities in which you could fall or become injured. ? Drive. ? Use heavy machinery. ? Drink alcohol. ? Take sleeping pills or medicines that cause drowsiness. ? Make important decisions or sign legal documents. ? Take care of children on your own. Eating and drinking  Follow the diet that is recommended by your health care provider.  If you vomit, drink water, juice, or soup when you can drink without vomiting.  Make sure you have little or no nausea before eating solid foods. General instructions  Take over-the-counter and  prescription medicines only as told by your health care provider.  If you have sleep apnea, surgery and certain medicines can increase your risk for breathing problems. Follow instructions from your health care provider about wearing your sleep device: ? Anytime you are sleeping, including during daytime naps. ? While taking prescription pain medicines, sleeping medicines, or medicines that make you drowsy.  If you smoke, do not smoke without supervision.  Keep all follow-up visits as told by your health care provider. This is important. Contact a health care provider if:  You keep feeling nauseous or you keep vomiting.  You feel light-headed.  You develop a rash.  You have a fever. Get help right away if:  You have trouble breathing. Summary  For several hours after your procedure, you may feel sleepy and have poor judgment.  Have a responsible adult stay with you for at least 24 hours or until you are awake and alert. This information is not intended to replace advice given to you by your health care provider. Make sure you discuss any questions you have with your health care provider. Document Revised: 03/04/2018 Document Reviewed: 03/26/2016 Elsevier Patient Education  [place].

## 2020-10-26 ENCOUNTER — Other Ambulatory Visit (HOSPITAL_COMMUNITY)
Admission: RE | Admit: 2020-10-26 | Discharge: 2020-10-26 | Disposition: A | Discharge status: Home / Self Care | Payer: 59 | Source: Ambulatory Visit | Attending: Cardiology | Admitting: Cardiology

## 2020-10-26 ENCOUNTER — Other Ambulatory Visit: Payer: Self-pay

## 2020-10-26 ENCOUNTER — Other Ambulatory Visit (HOSPITAL_COMMUNITY)
Admission: RE | Admit: 2020-10-26 | Discharge: 2020-10-26 | Disposition: A | Discharge status: Home / Self Care | Payer: 59 | Attending: Internal Medicine | Admitting: Internal Medicine

## 2020-10-26 ENCOUNTER — Encounter (HOSPITAL_COMMUNITY)
Admission: RE | Admit: 2020-10-26 | Discharge: 2020-10-26 | Disposition: A | Discharge status: Home / Self Care | Payer: 59 | Source: Ambulatory Visit | Attending: Cardiology | Admitting: Cardiology

## 2020-10-26 DIAGNOSIS — Z01818 Encounter for other preprocedural examination: Secondary | ICD-10-CM | POA: Insufficient documentation

## 2020-10-26 DIAGNOSIS — Z20822 Contact with and (suspected) exposure to covid-19: Secondary | ICD-10-CM | POA: Insufficient documentation

## 2020-10-26 LAB — BASIC METABOLIC PANEL
Anion gap: 11 (ref 5–15)
BUN: 23 mg/dL (ref 8–23)
CO2: 24 mmol/L (ref 22–32)
Calcium: 9.2 mg/dL (ref 8.9–10.3)
Chloride: 103 mmol/L (ref 98–111)
Creatinine, Ser: 1.74 mg/dL — ABNORMAL HIGH (ref 0.44–1.00)
GFR, Estimated: 33 mL/min — ABNORMAL LOW (ref >60)
Glucose, Bld: 140 mg/dL — ABNORMAL HIGH (ref 70–99)
Potassium: 4 mmol/L (ref 3.5–5.1)
Sodium: 138 mmol/L (ref 135–145)

## 2020-10-26 LAB — PROTIME-INR
INR: 1.6 — ABNORMAL HIGH (ref 0.8–1.2)
Prothrombin Time: 18 seconds — ABNORMAL HIGH (ref 11.4–15.2)

## 2020-10-27 LAB — SARS CORONAVIRUS 2 (TAT 6-24 HRS): SARS Coronavirus 2: NEGATIVE

## 2020-10-28 ENCOUNTER — Encounter (HOSPITAL_COMMUNITY)
Admission: RE | Disposition: A | Discharge status: Home / Self Care | Payer: Self-pay | Source: Home / Self Care | Attending: Cardiology

## 2020-10-28 ENCOUNTER — Ambulatory Visit (HOSPITAL_COMMUNITY)
Admission: RE | Admit: 2020-10-28 | Discharge: 2020-10-28 | Disposition: A | Discharge status: Home / Self Care | Payer: 59 | Attending: Cardiology | Admitting: Cardiology

## 2020-10-28 ENCOUNTER — Ambulatory Visit (HOSPITAL_COMMUNITY): Payer: 59 | Admitting: Anesthesiology

## 2020-10-28 ENCOUNTER — Encounter (HOSPITAL_COMMUNITY): Payer: Self-pay | Admitting: Cardiology

## 2020-10-28 DIAGNOSIS — Z7901 Long term (current) use of anticoagulants: Secondary | ICD-10-CM | POA: Diagnosis not present

## 2020-10-28 DIAGNOSIS — I484 Atypical atrial flutter: Secondary | ICD-10-CM | POA: Diagnosis not present

## 2020-10-28 DIAGNOSIS — Z6841 Body Mass Index (BMI) 40.0 and over, adult: Secondary | ICD-10-CM | POA: Insufficient documentation

## 2020-10-28 DIAGNOSIS — I4819 Other persistent atrial fibrillation: Secondary | ICD-10-CM | POA: Insufficient documentation

## 2020-10-28 DIAGNOSIS — Z79899 Other long term (current) drug therapy: Secondary | ICD-10-CM | POA: Diagnosis not present

## 2020-10-28 DIAGNOSIS — I10 Essential (primary) hypertension: Secondary | ICD-10-CM | POA: Insufficient documentation

## 2020-10-28 DIAGNOSIS — I428 Other cardiomyopathies: Secondary | ICD-10-CM | POA: Insufficient documentation

## 2020-10-28 HISTORY — PX: CARDIOVERSION: SHX1299

## 2020-10-28 LAB — GLUCOSE, CAPILLARY: Glucose-Capillary: 176 mg/dL — ABNORMAL HIGH (ref 70–99)

## 2020-10-28 SURGERY — CARDIOVERSION
Anesthesia: General

## 2020-10-28 MED ORDER — LACTATED RINGERS IV SOLN: INTRAVENOUS | Status: DC | PRN

## 2020-10-28 MED ORDER — DILTIAZEM HCL ER COATED BEADS 240 MG PO CP24: 240.0000 mg | ORAL_CAPSULE | Freq: Every day | ORAL | 1 refills | Status: DC

## 2020-10-28 MED ORDER — LIDOCAINE HCL (CARDIAC) PF 100 MG/5ML IV SOSY
PREFILLED_SYRINGE | INTRAVENOUS | Status: DC | PRN
  Administered 2020-10-28: 50 mg via INTRAVENOUS

## 2020-10-28 MED ORDER — PROPOFOL 10 MG/ML IV BOLUS
INTRAVENOUS | Status: DC | PRN
  Administered 2020-10-28: 40 mg via INTRAVENOUS
  Administered 2020-10-28: 60 mg via INTRAVENOUS

## 2020-10-28 MED ORDER — LACTATED RINGERS IV SOLN: Freq: Once | INTRAVENOUS | Status: AC

## 2020-10-28 MED ORDER — PROPOFOL 10 MG/ML IV BOLUS
INTRAVENOUS | Status: AC
  Filled 2020-10-28: qty 40

## 2020-10-28 NOTE — Discharge Instructions (Signed)
Electrical Cardioversion Electrical cardioversion is the delivery of a jolt of electricity to restore a normal rhythm to the heart. A rhythm that is too fast or is not regular keeps the heart from pumping well. In this procedure, sticky patches or metal paddles are placed on the chest to deliver electricity to the heart from a device. This procedure may be done in an emergency if:  There is low or no blood pressure as a result of the heart rhythm.  Normal rhythm must be restored as fast as possible to protect the brain and heart from further damage.  It may save a life. This may also be a scheduled procedure for irregular or fast heart rhythms that are not immediately life-threatening. Tell a health care provider about:  Any allergies you have.  All medicines you are taking, including vitamins, herbs, eye drops, creams, and over-the-counter medicines.  Any problems you or family members have had with anesthetic medicines.  Any blood disorders you have.  Any surgeries you have had.  Any medical conditions you have.  Whether you are pregnant or may be pregnant. What are the risks? Generally, this is a safe procedure. However, problems may occur, including:  Allergic reactions to medicines.  A blood clot that breaks free and travels to other parts of your body.  The possible return of an abnormal heart rhythm within hours or days after the procedure.  Your heart stopping (cardiac arrest). This is rare. What happens before the procedure? Medicines  Your health care provider may have you start taking: ? Blood-thinning medicines (anticoagulants) so your blood does not clot as easily. ? Medicines to help stabilize your heart rate and rhythm.  Ask your health care provider about: ? Changing or stopping your regular medicines. This is especially important if you are taking diabetes medicines or blood thinners. ? Taking medicines such as aspirin and ibuprofen. These medicines can  thin your blood. Do not take these medicines unless your health care provider tells you to take them. ? Taking over-the-counter medicines, vitamins, herbs, and supplements. General instructions  Follow instructions from your health care provider about eating or drinking restrictions.  Plan to have someone take you home from the hospital or clinic.  If you will be going home right after the procedure, plan to have someone with you for 24 hours.  Ask your health care provider what steps will be taken to help prevent infection. These may include washing your skin with a germ-killing soap. What happens during the procedure?   An IV will be inserted into one of your veins.  Sticky patches (electrodes) or metal paddles may be placed on your chest.  You will be given a medicine to help you relax (sedative).  An electrical shock will be delivered. The procedure may vary among health care providers and hospitals. What can I expect after the procedure?  Your blood pressure, heart rate, breathing rate, and blood oxygen level will be monitored until you leave the hospital or clinic.  Your heart rhythm will be watched to make sure it does not change.  You may have some redness on the skin where the shocks were given. Follow these instructions at home:  Do not drive for 24 hours if you were given a sedative during your procedure.  Take over-the-counter and prescription medicines only as told by your health care provider.  Ask your health care provider how to check your pulse. Check it often.  Rest for 48 hours after the procedure or   as told by your health care provider.  Avoid or limit your caffeine use as told by your health care provider.  Keep all follow-up visits as told by your health care provider. This is important. Contact a health care provider if:  You feel like your heart is beating too quickly or your pulse is not regular.  You have a serious muscle cramp that does not go  away. Get help right away if:  You have discomfort in your chest.  You are dizzy or you feel faint.  You have trouble breathing or you are short of breath.  Your speech is slurred.  You have trouble moving an arm or leg on one side of your body.  Your fingers or toes turn cold or blue. Summary  Electrical cardioversion is the delivery of a jolt of electricity to restore a normal rhythm to the heart.  This procedure may be done right away in an emergency or may be a scheduled procedure if the condition is not an emergency.  Generally, this is a safe procedure.  After the procedure, check your pulse often as told by your health care provider. This information is not intended to replace advice given to you by your health care provider. Make sure you discuss any questions you have with your health care provider. Document Revised: 07/07/2019 Document Reviewed: 07/07/2019 Elsevier Patient Education  2020 [employer]   Monitored Anesthesia Care, Care After These instructions provide you with information about caring for yourself after your procedure. Your health care provider may also give you more specific instructions. Your treatment has been planned according to current medical practices, but problems sometimes occur. Call your health care provider if you have any problems or questions after your procedure. What can I expect after the procedure? After your procedure, you may:  Feel sleepy for several hours.  Feel clumsy and have poor balance for several hours.  Feel forgetful about what happened after the procedure.  Have poor judgment for several hours.  Feel nauseous or vomit.  Have a sore throat if you had a breathing tube during the procedure. Follow these instructions at home: For at least 24 hours after the procedure:      Have a responsible adult stay with you. It is important to have someone help care for you until you are awake and alert.  Rest as  needed.  Do not: ? Participate in activities in which you could fall or become injured. ? Drive. ? Use heavy machinery. ? Drink alcohol. ? Take sleeping pills or medicines that cause drowsiness. ? Make important decisions or sign legal documents. ? Take care of children on your own. Eating and drinking  Follow the diet that is recommended by your health care provider.  If you vomit, drink water, juice, or soup when you can drink without vomiting.  Make sure you have little or no nausea before eating solid foods. General instructions  Take over-the-counter and prescription medicines only as told by your health care provider.  If you have sleep apnea, surgery and certain medicines can increase your risk for breathing problems. Follow instructions from your health care provider about wearing your sleep device: ? Anytime you are sleeping, including during daytime naps. ? While taking prescription pain medicines, sleeping medicines, or medicines that make you drowsy.  If you smoke, do not smoke without supervision.  Keep all follow-up visits as told by your health care provider. This is important. Contact a health care provider if:  You   keep feeling nauseous or you keep vomiting.  You feel light-headed.  You develop a rash.  You have a fever. Get help right away if:  You have trouble breathing. Summary  For several hours after your procedure, you may feel sleepy and have poor judgment.  Have a responsible adult stay with you for at least 24 hours or until you are awake and alert. This information is not intended to replace advice given to you by your health care provider. Make sure you discuss any questions you have with your health care provider. Document Revised: 03/04/2018 Document Reviewed: 03/26/2016 Elsevier Patient Education  2020 Elsevier Inc.  

## 2020-10-28 NOTE — Anesthesia Preprocedure Evaluation (Signed)
Anesthesia Evaluation  Patient identified by MRN, date of birth, ID band Patient awake    Reviewed: Allergy & Precautions, NPO status , Patient's Chart, lab work & pertinent test results, reviewed documented beta blocker date and time   History of Anesthesia Complications Negative for: history of anesthetic complications  Airway Mallampati: III  TM Distance: >3 FB Neck ROM: Full    Dental  (+) Teeth Intact, Dental Advisory Given   Pulmonary shortness of breath and with exertion,    breath sounds clear to auscultation       Cardiovascular Exercise Tolerance: Good hypertension, Pt. on medications and Pt. on home beta blockers +CHF  + dysrhythmias Atrial Fibrillation  Rhythm:Regular Rate:Tachycardia     Neuro/Psych    GI/Hepatic negative GI ROS, Neg liver ROS,   Endo/Other  diabetes, Well Controlled, Type 2Hypothyroidism Morbid obesity  Renal/GU Renal InsufficiencyRenal disease     Musculoskeletal   Abdominal   Peds  Hematology   Anesthesia Other Findings   Reproductive/Obstetrics                             Anesthesia Physical Anesthesia Plan  ASA: III  Anesthesia Plan: General   Post-op Pain Management:    Induction: Intravenous  PONV Risk Score and Plan: TIVA  Airway Management Planned: Nasal Cannula, Natural Airway and Simple Face Mask  Additional Equipment:   Intra-op Plan:   Post-operative Plan:   Informed Consent: I have reviewed the patients History and Physical, chart, labs and discussed the procedure including the risks, benefits and alternatives for the proposed anesthesia with the patient or authorized representative who has indicated his/her understanding and acceptance.     Dental advisory given  Plan Discussed with: CRNA and Surgeon  Anesthesia Plan Comments:         Anesthesia Quick Evaluation

## 2020-10-28 NOTE — Interval H&P Note (Signed)
History and Physical Interval Note:  10/28/2020 8:36 AM  Patient presents for elective cardioversion of atrial flutter, procedure scheduled by Dr. [name].  She has a history of persistent atrial fibrillation status post ablation, continues on Xarelto for stroke prophylaxis, reports consistency with use.  Also on Coreg and Cardizem CD for heart rate control.  Lab work reviewed, creatinine 1.74 stable compared to lab work from October, SARS coronavirus 2 test negative.  Informed consent obtained and she is in agreement to proceed.  Satira Sark, M.D., F.A.C.C.

## 2020-10-28 NOTE — Anesthesia Postprocedure Evaluation (Signed)
Anesthesia Post Note  Patient: Isabella Hall  Procedure(s) Performed: CARDIOVERSION (N/A )  Patient location during evaluation: PACU Anesthesia Type: General Level of consciousness: awake, oriented, awake and alert and patient cooperative Pain management: satisfactory to patient Vital Signs Assessment: post-procedure vital signs reviewed and stable Respiratory status: spontaneous breathing, respiratory function stable and nonlabored ventilation Cardiovascular status: stable Postop Assessment: no apparent nausea or vomiting Anesthetic complications: no   No complications documented.   Last Vitals:  Vitals:   10/28/20 0856  BP: (!) 116/94  Pulse: (!) 136  Resp: 18  Temp: 36.6 C  SpO2: 97%    Last Pain:  Vitals:   10/28/20 0856  TempSrc: Oral  PainSc: 0-No pain                 Eithen Castiglia

## 2020-10-28 NOTE — Progress Notes (Signed)
Electrical Cardioversion Procedure Note CARREN BLAKLEY [id number] [birth year]  Procedure: Electrical Cardioversion Indications:  Atrial Fibrillation  Procedure Details Consent: on chart and signed Time Out: Verified patient identification, verified procedure, site/side was marked, verified correct patient position, special equipment/implants available, medications/allergies/relevent history reviewed, required imaging and test results available.  0928  Patient placed on cardiac monitor, pulse oximetry, supplemental oxygen as necessary.  Sedation given: 0929 per anesthesia propofol Pacer pads placed anterior posterior checked by Dr. Domenic Polite  Cardioverted  time(s). 1 Cardioverted at 150 Joules  Evaluation Findings: Post procedure EKG shows: Sinus Bradycardia verified by Dr. Domenic Polite Complications: none Patient {did tolerate procedure well.   Lorna Few 10/28/2020, 10:05 AM

## 2020-10-28 NOTE — CV Procedure (Signed)
Elective direct-current cardioversion  Indication: Persistent, atypical atrial flutter  Description of procedure: After informed consent was obtained, patient was taken to the procedure suite where a timeout was performed.  Anterior and posterior pads were placed in standard fashion and connected to a biphasic defibrillator.  Deep sedation was achieved with propofol per the anesthesia service, please refer to their records for dose administration.  Sandbag was placed on the anterior chest pad.  A single, synchronized shock at 150 J was delivered with restoration of sinus bradycardia.  Patient remained hemodynamically stable throughout.  Post cardioversion ECG showed sinus bradycardia.  Disposition: Patient will be observed in the PACU.  Discharge home today on same dose of Coreg 25 mg twice daily, but reduce Cardizem CD to 240 mg daily.  Continue Xarelto.  Office follow-up arranged.  Satira Sark, M.D., F.A.C.C.

## 2020-10-28 NOTE — Transfer of Care (Signed)
Immediate Anesthesia Transfer of Care Note  Patient: Isabella Hall  Procedure(s) Performed: CARDIOVERSION (N/A )  Patient Location: PACU  Anesthesia Type:MAC  Level of Consciousness: awake, alert , oriented and patient cooperative  Airway & Oxygen Therapy: Patient Spontanous Breathing  Post-op Assessment: Report given to RN, Post -op Vital signs reviewed and stable and Patient moving all extremities X 4  Post vital signs: Reviewed and stable  Last Vitals:  Vitals Value Taken Time  BP    Temp    Pulse 58 10/28/20 0944  Resp    SpO2 97 % 10/28/20 0944  Vitals shown include unvalidated device data.  Last Pain:  Vitals:   10/28/20 0856  TempSrc: Oral  PainSc: 0-No pain      Patients Stated Pain Goal: 5 (28/97/91 5041)  Complications: No complications documented.

## 2020-10-29 ENCOUNTER — Other Ambulatory Visit: Payer: Self-pay | Admitting: Internal Medicine

## 2020-10-29 NOTE — Telephone Encounter (Signed)
*  STAT* If patient is at the pharmacy, call can be transferred to refill team.   1. Which medications need to be refilled? (please list name of each medication and dose if known)  diltiazem (CARDIZEM CD) 240 MG 24 hr capsule  2. Which pharmacy/location (including street and city if local pharmacy) is medication to be sent to? [place], [place] [address]  3. Do they need a 30 day or 90 day supply? 30 with refills    Pt went to her pharmacy to get the medication and the pharmacy did not have any new rx for the patient to fill. Please send a new rx to the pharmacy

## 2020-10-29 NOTE — Telephone Encounter (Signed)
Informed patient that refill was sent in yesterday - she will call pharmacy.

## 2020-11-02 ENCOUNTER — Encounter (HOSPITAL_COMMUNITY): Payer: Self-pay | Admitting: Cardiology

## 2020-11-02 ENCOUNTER — Telehealth: Payer: Self-pay | Admitting: Internal Medicine

## 2020-11-02 MED ORDER — DILTIAZEM HCL ER COATED BEADS 240 MG PO CP24
240.0000 mg | ORAL_CAPSULE | Freq: Every day | ORAL | 1 refills | Status: DC
Start: 2020-11-02 — End: 2020-11-08

## 2020-11-02 MED ORDER — CARVEDILOL 25 MG PO TABS
25.0000 mg | ORAL_TABLET | Freq: Two times a day (BID) | ORAL | 3 refills | Status: DC
Start: 2020-11-02 — End: 2020-12-08

## 2020-11-02 NOTE — Telephone Encounter (Signed)
After pt had her DCCV Dr. Domenic Polite changed her diltiazem (CARDIZEM CD) 240 MG 24 hr capsule [[id number]]  And she's confused on what she should be taking. When she went to pick it up at the pharmacy, they told her she didn't have it in.   Please give pt a call @ [phone]

## 2020-11-02 NOTE — Telephone Encounter (Signed)
*  STAT* If patient is at the pharmacy, call can be transferred to refill team.   1. Which medications need to be refilled? (please list name of each medication and dose if known)  carvedilol (COREG) 25 MG tablet [[id number]]   2. Which pharmacy/location (including street and city if local pharmacy) is medication to be sent to? [place]  3. Do they need a 30 day or 90 day supply?  90 day

## 2020-11-02 NOTE — Telephone Encounter (Signed)
Advised she should be on cardizem cd 240 mg daily. Verbalized understanding.

## 2020-11-04 ENCOUNTER — Ambulatory Visit (HOSPITAL_COMMUNITY): Payer: 59 | Admitting: Nurse Practitioner

## 2020-11-08 ENCOUNTER — Ambulatory Visit (HOSPITAL_COMMUNITY)
Admission: RE | Admit: 2020-11-08 | Discharge: 2020-11-08 | Disposition: A | Discharge status: Home / Self Care | Payer: 59 | Source: Ambulatory Visit | Attending: Nurse Practitioner | Admitting: Nurse Practitioner

## 2020-11-08 ENCOUNTER — Other Ambulatory Visit: Payer: Self-pay

## 2020-11-08 ENCOUNTER — Encounter (HOSPITAL_COMMUNITY): Payer: Self-pay | Admitting: Nurse Practitioner

## 2020-11-08 VITALS — BP 110/78 | HR 120 | Ht 66.0 in | Wt 307.8 lb

## 2020-11-08 DIAGNOSIS — D6869 Other thrombophilia: Secondary | ICD-10-CM

## 2020-11-08 DIAGNOSIS — I4819 Other persistent atrial fibrillation: Secondary | ICD-10-CM

## 2020-11-08 DIAGNOSIS — I428 Other cardiomyopathies: Secondary | ICD-10-CM | POA: Diagnosis not present

## 2020-11-08 DIAGNOSIS — Z7901 Long term (current) use of anticoagulants: Secondary | ICD-10-CM | POA: Insufficient documentation

## 2020-11-08 DIAGNOSIS — Z6841 Body Mass Index (BMI) 40.0 and over, adult: Secondary | ICD-10-CM | POA: Insufficient documentation

## 2020-11-08 DIAGNOSIS — E669 Obesity, unspecified: Secondary | ICD-10-CM | POA: Insufficient documentation

## 2020-11-08 DIAGNOSIS — I4891 Unspecified atrial fibrillation: Secondary | ICD-10-CM | POA: Diagnosis present

## 2020-11-08 DIAGNOSIS — I484 Atypical atrial flutter: Secondary | ICD-10-CM | POA: Insufficient documentation

## 2020-11-08 DIAGNOSIS — I10 Essential (primary) hypertension: Secondary | ICD-10-CM | POA: Diagnosis not present

## 2020-11-08 MED ORDER — DILTIAZEM HCL ER COATED BEADS 240 MG PO CP24
240.0000 mg | ORAL_CAPSULE | Freq: Two times a day (BID) | ORAL | 1 refills | Status: DC
Start: 2020-11-08 — End: 2020-12-14

## 2020-11-08 NOTE — Patient Instructions (Signed)
Increase cardizem 240mg  twice a day  Call Wednesday with update of heart rates [phone]

## 2020-11-08 NOTE — Progress Notes (Signed)
Primary Care Physician: Doree Albee, MD Referring Physician: Dr. Serita Kyle Isabella Hall is a 64 y.o. female with a h/o  Afib, s/p ablation in 2019, CHF, HTN, Mod MR, NICM, DMT2 that is in the afib clinic for f/u recent cardioversion. She  saw Dr. [name]   in the [facility]  clinic 11/5 for typical atrial flutter and she had a successful cardioversion. Ekg now shows atypical atrial flutter vrs coarse afib. She felt improved after CV for around 3 days and then started feeling tired  again. Her v rate is 120 bpm today. Her cardizem was reduced from her usual dose of 240 mg bid to 240 mg daily at time of CV. She had lost down to 260 lb, thru diet and walking several days a week, but got "lazy",  stopped walking, diet slipped  and her weight has now  increased to over 300 lbs again.   Today, she denies symptoms of palpitations, chest pain, shortness of breath, orthopnea, PND, lower extremity edema, dizziness, presyncope, syncope, or neurologic sequela. The patient is tolerating medications without difficulties and is otherwise without complaint today.   Past Medical History:  Diagnosis Date  . Atrial fibrillation (HCC)     CHADSVASC score 4  . Essential hypertension   . Hypothyroidism   . Obesity   . Type 2 diabetes, diet controlled ([place])   . Typical atrial flutter ([place]) 09/2020   Past Surgical History:  Procedure Laterality Date  . APPENDECTOMY  1979  . ATRIAL FIBRILLATION ABLATION  03/26/2018  . ATRIAL FIBRILLATION ABLATION N/A 03/26/2018   Procedure: ATRIAL FIBRILLATION ABLATION;  Surgeon: Thompson Grayer, MD;  Location: [facility] CV LAB;  Service: Cardiovascular;  Laterality: N/A;  . CARDIOVERSION N/A 01/25/2015   Procedure: CARDIOVERSION;  Surgeon: Satira Sark, MD;  Location: [facility] ORS;  Service: Cardiovascular;  Laterality: N/A;  . CARDIOVERSION N/A 10/28/2020   Procedure: CARDIOVERSION;  Surgeon: Satira Sark, MD;  Location: [facility] END[facility] SUITE;  Service: Cardiovascular;   Laterality: N/A;  . [place]  . LEFT HEART CATHETERIZATION WITH CORONARY ANGIOGRAM N/A 01/27/2015   Procedure: LEFT HEART CATHETERIZATION WITH CORONARY ANGIOGRAM;  Surgeon: Sinclair Grooms, MD;  Location: [facility] CATH LAB;  Service: Cardiovascular;  Laterality: N/A;  . TEE WITHOUT CARDIOVERSION N/A 01/25/2015   Procedure: TRANSESOPHAGEAL ECHOCARDIOGRAM (TEE);  Surgeon: Satira Sark, MD;  Location: [facility] ORS;  Service: Cardiovascular;  Laterality: N/A;  . TUBAL LIGATION  1980    Current Outpatient Medications  Medication Sig Dispense Refill  . blood glucose meter kit and supplies KIT Dispense based on patient and insurance preference. Use up to four times daily as directed. (FOR ICD-9 250.00, 250.01). 1 each 0  . Capsicum, Cayenne, (CAYENNE PO) Take 1 Scoop by mouth daily.     . carvedilol (COREG) 25 MG tablet Take 1 tablet (25 mg total) by mouth 2 (two) times daily with a meal. 180 tablet 3  . cholecalciferol (VITAMIN D3) 25 MCG (1000 UNIT) tablet Take 1,000 Units by mouth daily.    Marland Kitchen CINNAMON PO Take 1 capsule by mouth daily.    Marland Kitchen diltiazem (CARDIZEM CD) 240 MG 24 hr capsule Take 1 capsule (240 mg total) by mouth daily. 30 capsule 1  . GARLIC PO Take 1 tablet by mouth daily.    . Ginger, Zingiber officinalis, (GINGER EXTRACT PO) Take 1 tablet by mouth daily.     Marland Kitchen levothyroxine (SYNTHROID) 150 MCG tablet Take 1 tablet (150  mcg total) by mouth daily. 90 tablet 0  . Multiple Vitamin (MULTIVITAMIN WITH MINERALS) TABS tablet Take 1 tablet by mouth daily. (Patient not taking: Reported on 10/22/2020)    . Multiple Vitamins-Minerals (EMERGEN-C IMMUNE PO) Take 1 tablet by mouth daily.     . Turmeric 500 MG CAPS Take by mouth. (Patient not taking: Reported on 10/22/2020)    . XARELTO 20 MG TABS tablet TAKE 1 TABLET BY MOUTH ONCE DAILY WITH SUPPER (Patient taking differently: Take 20 mg by mouth daily with supper. ) 30 tablet 6   Current Facility-Administered Medications  Medication Dose  Route Frequency Provider Last Rate Last Admin  . progesterone (PROMETRIUM) capsule 200 mg  200 mg Oral Daily Gosrani, Nimish C, MD        No Known Allergies  Social History   Socioeconomic History  . Marital status: Divorced    Spouse name: Not on file  . Number of children: Not on file  . Years of education: Not on file  . Highest education level: Not on file  Occupational History  . 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 Smoking status: Never Smoker  . Smokeless tobacco: Never Used  Vaping Use  . Vaping Use: Never used  Substance and Sexual Activity  . Alcohol use: No    Alcohol/week: 0.0 standard drinks  . Drug use: No  . Sexual activity: Not on file  Other Topics Concern  . Not on file  Social History Narrative   Divorced for 6 years,was married 12 years.Lives alone.Works as Corporate treasurer for [employer] .   Social Determinants of Health   Financial Resource Strain:   . Difficulty of Paying Living Expenses: Not on file  Food Insecurity:   . Worried About Charity fundraiser in the Last Year: Not on file  . Ran Out of Food in the Last Year: Not on file  Transportation Needs:   . Lack of Transportation (Medical): Not on file  . Lack of Transportation (Non-Medical): Not on file  Physical Activity:   . Days of Exercise per Week: Not on file  . Minutes of Exercise per Session: Not on file  Stress:   . Feeling of Stress : Not on file  Social Connections:   . Frequency of Communication with Friends and Family: Not on file  . Frequency of Social Gatherings with Friends and Family: Not on file  . Attends Religious Services: Not on file  . Active Member of Clubs or Organizations: Not on file  . Attends Archivist Meetings: Not on file  . Marital Status: Not on file  Intimate Partner Violence:   . Fear of Current or Ex-Partner: Not on file  . Emotionally Abused: Not on file  . Physically Abused: Not on file  . Sexually Abused: Not on  file    Family History  Problem Relation Age of Onset  . Cancer Mother   . Heart disease Father   . Hypertension Other   . Coronary artery disease Sister        died of an MI in her 39's  . Cancer Maternal Grandmother   . Atrial fibrillation Neg Hx   . Diabetes Neg Hx     ROS- All systems are reviewed and negative except as per the HPI above  Physical Exam: There were no vitals filed for this visit. Wt Readings from Last 3 Encounters:  10/26/20 134.7 kg  10/22/20 (!) 138.3 kg  09/30/20 Marland Kitchen)  141.3 kg    Labs: Lab Results  Component Value Date   NA 138 10/26/2020   K 4.0 10/26/2020   CL 103 10/26/2020   CO2 24 10/26/2020   GLUCOSE 140 (H) 10/26/2020   BUN 23 10/26/2020   CREATININE 1.74 (H) 10/26/2020   CALCIUM 9.2 10/26/2020   MG 2.0 08/07/2017   Lab Results  Component Value Date   INR 1.6 (H) 10/26/2020   Lab Results  Component Value Date   CHOL 185 09/27/2020   HDL 50 09/27/2020   LDLCALC 112 (H) 09/27/2020   TRIG 119 09/27/2020     GEN- The patient is well appearing, alert and oriented x 3 today.   Head- normocephalic, atraumatic Eyes-  Sclera clear, conjunctiva pink Ears- hearing intact Oropharynx- clear Neck- supple, no JVP Lymph- no cervical lymphadenopathy Lungs- Clear to ausculation bilaterally, normal work of breathing Heart- Rapid irregular rate and rhythm, no murmurs, rubs or gallops, PMI not laterally displaced GI- soft, NT, ND, + BS Extremities- no clubbing, cyanosis, or edema MS- no significant deformity or atrophy Skin- no rash or lesion Psych- euthymic mood, full affect Neuro- strength and sensation are intact  EKG-coarse afib vrs atypical flutter  at 120 bpm, qrs int 86 ms, qtc 381 ms    Assessment and Plan: 1. H/o of afib, typical and atypical  atrial flutter  I discussed with Dr. [name] She will go back to 240 mg Cardizem bid to control v rate  She  will call office with her BP's and V rates for the next 2 days on Wednesday   Dr. [name] feels the ekg today may represent coarse afib and with her current weight fears repeat ablation may not be that helpful to pt  She may need antiarrythmic drug I will see her back early next week to further evaluate   2. CHA2DS2VASc score of 4 Continue xarelto 20 mg daily   3. BMI of 49.23 Weight loss encouraged   4. HTN Stable  5. Nonischemic CM EF previously recovered with Sinus rhythm   F/u next week  Butch Penny C. Allycia Pitz, [facility] [address] [place], [place] [address] [phone]

## 2020-11-15 ENCOUNTER — Ambulatory Visit (INDEPENDENT_AMBULATORY_CARE_PROVIDER_SITE_OTHER): Payer: 59 | Admitting: Nurse Practitioner

## 2020-11-15 ENCOUNTER — Encounter (INDEPENDENT_AMBULATORY_CARE_PROVIDER_SITE_OTHER): Payer: Self-pay | Admitting: Nurse Practitioner

## 2020-11-15 ENCOUNTER — Telehealth (HOSPITAL_COMMUNITY): Payer: Self-pay | Admitting: *Deleted

## 2020-11-15 ENCOUNTER — Other Ambulatory Visit: Payer: Self-pay

## 2020-11-15 VITALS — BP 118/68 | HR 76 | Temp 97.0°F | Ht 66.0 in | Wt 314.4 lb

## 2020-11-15 DIAGNOSIS — E039 Hypothyroidism, unspecified: Secondary | ICD-10-CM

## 2020-11-15 DIAGNOSIS — I10 Essential (primary) hypertension: Secondary | ICD-10-CM

## 2020-11-15 DIAGNOSIS — N1832 Chronic kidney disease, stage 3b: Secondary | ICD-10-CM

## 2020-11-15 DIAGNOSIS — E1165 Type 2 diabetes mellitus with hyperglycemia: Secondary | ICD-10-CM

## 2020-11-15 DIAGNOSIS — I4819 Other persistent atrial fibrillation: Secondary | ICD-10-CM

## 2020-11-15 MED ORDER — ACCU-CHEK AVIVA PLUS VI STRP: ORAL_STRIP | 12 refills | Status: DC

## 2020-11-15 NOTE — Telephone Encounter (Signed)
Patient called with update of BP and HR 125/93 and 82.

## 2020-11-15 NOTE — Progress Notes (Signed)
Subjective:  Patient ID: Isabella Hall, female    DOB: September 10, 1956  Age: 64 y.o. MRN: [id number]  CC:  Chief Complaint  Patient presents with  . Follow-up    going back to AFIB clinic tomorrow, can her beat get out of whack because of her getting sick  . Hypertension  . Hypothyroidism  . Diabetes  . Chronic Kidney Disease      HPI  This patient arrives today for the above.  Hypertension: She continues on Coreg and diltiazem and is tolerating these well.  Hypothyroidism: At last office visit we transitioned her off of NP thyroid and put her on levothyroxine.  TSH was elevated at last lab draw.  She is due to have TSH checked again today.  Diabetes: Last A1c was 7.6.  She is not currently on ACE inhibitor or statin therapy.  She is not on any other medications to control her blood sugar.  She is currently controlling blood sugars with lifestyle and diet.  She was sick with bronchitis a few months ago.  She is requesting a refill on her diabetic test strips.  Chronic kidney disease: She has a history of chronic kidney disease and was referred to nephrology, however tells me she has not completed this referral.  She tells me that she was called and told that her reduced kidney function is most likely related to acute dehydration.  Last EGFR was 33 and this was collected earlier this month.  Creatinine was 1.74.  Atrial fibrillation: She has a history of atrial fibrillation and continues on her Xarelto.  She underwent cardioversion earlier this month and tells me she follows up with the atrial fibrillation clinic tomorrow.  Past Medical History:  Diagnosis Date  . Atrial fibrillation (HCC)     CHADSVASC score 4  . Essential hypertension   . Hypothyroidism   . Obesity   . Type 2 diabetes, diet controlled ([place])   . Typical atrial flutter ([place]) 09/2020      Family History  Problem Relation Age of Onset  . Cancer Mother   . Heart disease Father   . Hypertension Other   .  Coronary artery disease Sister        died of an MI in her 38's  . Cancer Maternal Grandmother   . Atrial fibrillation Neg Hx   . Diabetes Neg Hx     Social History   Social History Narrative   Divorced for 6 years,was married 12 years.Lives alone.Works as Corporate treasurer for [employer] .   Social History   Tobacco Use  . Smoking status: Never Smoker  . Smokeless tobacco: Never Used  Substance Use Topics  . Alcohol use: No    Alcohol/week: 0.0 standard drinks     Current Meds  Medication Sig  . blood glucose meter kit and supplies KIT Dispense based on patient and insurance preference. Use up to four times daily as directed. (FOR ICD-9 250.00, 250.01).  . Capsicum, Cayenne, (CAYENNE PO) Take 1 Scoop by mouth daily.   . carvedilol (COREG) 25 MG tablet Take 1 tablet (25 mg total) by mouth 2 (two) times daily with a meal.  . cholecalciferol (VITAMIN D3) 25 MCG (1000 UNIT) tablet Take 1,000 Units by mouth daily.  Marland Kitchen CINNAMON PO Take 1 capsule by mouth daily.  Marland Kitchen diltiazem (CARDIZEM CD) 240 MG 24 hr capsule Take 1 capsule (240 mg total) by mouth in the morning and at bedtime.  Marland Kitchen GARLIC PO Take 1 tablet by  mouth daily.  . Ginger, Zingiber officinalis, (GINGER EXTRACT PO) Take 1 tablet by mouth daily.   Marland Kitchen levothyroxine (SYNTHROID) 150 MCG tablet Take 1 tablet (150 mcg total) by mouth daily.  . Multiple Vitamin (MULTIVITAMIN WITH MINERALS) TABS tablet Take 1 tablet by mouth daily.   . Multiple Vitamins-Minerals (EMERGEN-C IMMUNE PO) Take 1 tablet by mouth daily.   Alveda Reasons 20 MG TABS tablet TAKE 1 TABLET BY MOUTH ONCE DAILY WITH SUPPER (Patient taking differently: Take 20 mg by mouth daily with supper. )    ROS:  Review of Systems  Constitutional: Negative for malaise/fatigue.  Respiratory: Negative for shortness of breath.   Cardiovascular: Negative for palpitations.     Objective:   Today's Vitals: BP 118/68   Pulse 76   Temp (!) 97 F (36.1 C) (Temporal)   Ht 5' 6"  (1.676 m)   Wt (!)  314 lb 6.4 oz (142.6 kg)   SpO2 98%   BMI 50.75 kg/m  Vitals with BMI 11/15/2020 11/08/2020 10/28/2020  Height 5' 6"  5' 6"  -  Weight 314 lbs 6 oz 307 lbs 13 oz -  BMI 86.77 37.3 -  Systolic 668 159 470  Diastolic 68 78 87  Pulse 76 120 63     Physical Exam Vitals reviewed.  Constitutional:      General: She is not in acute distress.    Appearance: Normal appearance.  HENT:     Head: Normocephalic and atraumatic.  Neck:     Vascular: No carotid bruit.  Cardiovascular:     Rate and Rhythm: Normal rate. Rhythm irregular.     Pulses: Normal pulses.     Heart sounds: Normal heart sounds.  Pulmonary:     Effort: Pulmonary effort is normal.     Breath sounds: Normal breath sounds.  Skin:    General: Skin is warm and dry.  Neurological:     General: No focal deficit present.     Mental Status: She is alert and oriented to person, place, and time.  Psychiatric:        Mood and Affect: Mood normal.        Behavior: Behavior normal.        Judgment: Judgment normal.          Assessment and Plan   1. Hypothyroidism, unspecified type   2. Benign essential HTN   3. Stage 3b chronic kidney disease (HCC)   4. Persistent atrial fibrillation ([place])   5. Controlled type 2 diabetes mellitus with hyperglycemia, without long-term current use of insulin ([place])      Plan: 1.  Unfortunately we do not have the phlebotomist in the office today so she will come back next week to have blood work checked.  Will refill levothyroxine based on blood work results. 2.  She will continue on her current medications as prescribed. 3.  I do not believe that I would have recommended she not follow-up with nephrology based on the fact that her kidney dysfunction has been chronic in nature, however due to the miscommunication we will check complete metabolic panel and will probably still recommend recommend that she be evaluated by nephrology, will await these results. 4.  She is encouraged to  follow-up with the atrial fibrillation clinic as scheduled. 5.  She will continue to follow a healthy diet and we will check urine for albuminuria.   Tests ordered Orders Placed This Encounter  Procedures  . TSH  . CMP with eGFR([facility])  . Microalbumin/Creatinine Ratio,  Urine      Meds ordered this encounter  Medications  . glucose blood (ACCU-CHEK AVIVA PLUS) test strip    Sig: Check your blood glucose daily    Dispense:  100 each    Refill:  12    Order Specific Question:   Supervising Provider    Answer:   Doree Albee [[id number]]    Patient to follow-up in 3 months or sooner as needed.  Ailene Ards, NP

## 2020-11-16 ENCOUNTER — Ambulatory Visit (HOSPITAL_COMMUNITY)
Admission: RE | Admit: 2020-11-16 | Discharge: 2020-11-16 | Disposition: A | Discharge status: Home / Self Care | Payer: 59 | Source: Ambulatory Visit | Attending: Nurse Practitioner | Admitting: Nurse Practitioner

## 2020-11-16 ENCOUNTER — Telehealth: Payer: Self-pay | Admitting: Pharmacist

## 2020-11-16 ENCOUNTER — Encounter (HOSPITAL_COMMUNITY): Payer: Self-pay | Admitting: Nurse Practitioner

## 2020-11-16 VITALS — BP 114/84 | HR 132 | Ht 66.0 in | Wt 312.0 lb

## 2020-11-16 DIAGNOSIS — I4891 Unspecified atrial fibrillation: Secondary | ICD-10-CM | POA: Insufficient documentation

## 2020-11-16 DIAGNOSIS — Z7901 Long term (current) use of anticoagulants: Secondary | ICD-10-CM | POA: Insufficient documentation

## 2020-11-16 DIAGNOSIS — E669 Obesity, unspecified: Secondary | ICD-10-CM | POA: Insufficient documentation

## 2020-11-16 DIAGNOSIS — D6869 Other thrombophilia: Secondary | ICD-10-CM | POA: Diagnosis not present

## 2020-11-16 DIAGNOSIS — I484 Atypical atrial flutter: Secondary | ICD-10-CM | POA: Insufficient documentation

## 2020-11-16 DIAGNOSIS — Z6841 Body Mass Index (BMI) 40.0 and over, adult: Secondary | ICD-10-CM | POA: Diagnosis not present

## 2020-11-16 DIAGNOSIS — I10 Essential (primary) hypertension: Secondary | ICD-10-CM | POA: Diagnosis not present

## 2020-11-16 DIAGNOSIS — I4819 Other persistent atrial fibrillation: Secondary | ICD-10-CM

## 2020-11-16 LAB — BASIC METABOLIC PANEL
Anion gap: 10 (ref 5–15)
BUN: 25 mg/dL — ABNORMAL HIGH (ref 8–23)
CO2: 25 mmol/L (ref 22–32)
Calcium: 9 mg/dL (ref 8.9–10.3)
Chloride: 103 mmol/L (ref 98–111)
Creatinine, Ser: 1.54 mg/dL — ABNORMAL HIGH (ref 0.44–1.00)
GFR, Estimated: 38 mL/min — ABNORMAL LOW (ref >60)
Glucose, Bld: 159 mg/dL — ABNORMAL HIGH (ref 70–99)
Potassium: 3.9 mmol/L (ref 3.5–5.1)
Sodium: 138 mmol/L (ref 135–145)

## 2020-11-16 LAB — MAGNESIUM: Magnesium: 2.2 mg/dL (ref 1.7–2.4)

## 2020-11-16 NOTE — Telephone Encounter (Signed)
Medication list reviewed in anticipation of upcoming Tikosyn initiation. Patient is not taking any contraindicated or QTc prolonging medications.   Patient is anticoagulated on Xarelto 20mg  daily. Her weight is 141.5kg and BMI is 50.36. Efficacy data is limited in patients this morbidly obese, however there was a recent study that came out showing similar efficacy and safety data in ~1,000 adults with weight 100kg - 300kg being treated for VTE with Xarelto vs warfarin. Can consider pt therapeutically anticoagulated as long as she has not missed any doses in the past 3 weeks. Journal citation for reference: https://accpjournals.onlinelibrary.wiley.com/doi/10.1002/phar.2369  Patient will need to be counseled to avoid use of Benadryl while on Tikosyn and in the 2-3 days prior to Tikosyn initiation.

## 2020-11-16 NOTE — Progress Notes (Signed)
Primary Care Physician: Doree Albee, MD Referring Physician: Dr. Serita Kyle Isabella Hall is a 64 y.o. female with a h/o  Afib, s/p ablation in 2019, CHF, HTN, Mod MR, NICM, DMT2 that is in the afib clinic for f/u recent cardioversion. She  saw Dr. [name]   in the [facility]  clinic 11/5 for typical atrial flutter and she had a successful cardioversion. Ekg now shows atypical atrial flutter vrs coarse afib. She felt improved after CV for around 3 days and then started feeling tired  again. Her v rate is 120 bpm today. Her cardizem was reduced from her usual dose of 240 mg bid to 240 mg daily at time of CV. She had lost down to 260 lb, thru diet and walking several days a week, but got "lazy",  stopped walking, diet slipped  and her weight has now  increased to over 300 lbs again.   F/u in afib clinic, 11/16/20. She remains in afib with RVR, despite increasing cardizem to bid. . I discussed with Dr. [name] and he felt that an antiarrythmic would be necessary and tikosyn would be an good option. I discussed with pt and she is agreeable.   Today, she denies symptoms of palpitations, chest pain, shortness of breath, orthopnea, PND, lower extremity edema, dizziness, presyncope, syncope, or neurologic sequela. The patient is tolerating medications without difficulties and is otherwise without complaint today.   Past Medical History:  Diagnosis Date  . Atrial fibrillation (HCC)     CHADSVASC score 4  . Essential hypertension   . Hypothyroidism   . Obesity   . Type 2 diabetes, diet controlled ([place])   . Typical atrial flutter ([place]) 09/2020   Past Surgical History:  Procedure Laterality Date  . APPENDECTOMY  1979  . ATRIAL FIBRILLATION ABLATION  03/26/2018  . ATRIAL FIBRILLATION ABLATION N/A 03/26/2018   Procedure: ATRIAL FIBRILLATION ABLATION;  Surgeon: Thompson Grayer, MD;  Location: [facility] CV LAB;  Service: Cardiovascular;  Laterality: N/A;  . CARDIOVERSION N/A 01/25/2015   Procedure:  CARDIOVERSION;  Surgeon: Satira Sark, MD;  Location: [facility] ORS;  Service: Cardiovascular;  Laterality: N/A;  . CARDIOVERSION N/A 10/28/2020   Procedure: CARDIOVERSION;  Surgeon: Satira Sark, MD;  [facility]: [facility] ENDO SUITE;  Service: Cardiovascular;  Laterality: N/A;  . [place]  . LEFT HEART CATHETERIZATION WITH CORONARY ANGIOGRAM N/A 01/27/2015   Procedure: LEFT HEART CATHETERIZATION WITH CORONARY ANGIOGRAM;  Surgeon: Sinclair Grooms, MD;  Location: [facility] CATH LAB;  Service: Cardiovascular;  Laterality: N/A;  . TEE WITHOUT CARDIOVERSION N/A 01/25/2015   Procedure: TRANSESOPHAGEAL ECHOCARDIOGRAM (TEE);  Surgeon: Satira Sark, MD;  Location: [facility] ORS;  Service: Cardiovascular;  Laterality: N/A;  . TUBAL LIGATION  1980    Current Outpatient Medications  Medication Sig Dispense Refill  . blood glucose meter kit and supplies KIT Dispense based on patient and insurance preference. Use up to four times daily as directed. (FOR ICD-9 250.00, 250.01). 1 each 0  . Capsicum, Cayenne, (CAYENNE PO) Take 1 Scoop by mouth daily.     . carvedilol (COREG) 25 MG tablet Take 1 tablet (25 mg total) by mouth 2 (two) times daily with a meal. 180 tablet 3  . cholecalciferol (VITAMIN D3) 25 MCG (1000 UNIT) tablet Take 1,000 Units by mouth daily.    Marland Kitchen CINNAMON PO Take 1 capsule by mouth daily.    Marland Kitchen diltiazem (CARDIZEM CD) 240 MG 24 hr capsule Take 1 capsule (240  mg total) by mouth in the morning and at bedtime. 30 capsule 1  . GARLIC PO Take 1 tablet by mouth daily.    . Ginger, Zingiber officinalis, (GINGER EXTRACT PO) Take 1 tablet by mouth daily.     Marland Kitchen glucose blood (ACCU-CHEK AVIVA PLUS) test strip Check your blood glucose daily 100 each 12  . levothyroxine (SYNTHROID) 150 MCG tablet Take 1 tablet (150 mcg total) by mouth daily. 90 tablet 0  . Multiple Vitamin (MULTIVITAMIN WITH MINERALS) TABS tablet Take 1 tablet by mouth daily.     . Multiple Vitamins-Minerals (EMERGEN-C IMMUNE PO) Take  1 tablet by mouth daily.     Alveda Reasons 20 MG TABS tablet TAKE 1 TABLET BY MOUTH ONCE DAILY WITH SUPPER (Patient taking differently: Take 20 mg by mouth daily with supper. ) 30 tablet 6   No current facility-administered medications for this encounter.    No Known Allergies  Social History   Socioeconomic History  . Marital status: Divorced    Spouse name: Not on file  . Number of children: Not on file  . Years of education: Not on file  . Highest education level: Not on file  Occupational History  . 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 Smoking status: Never Smoker  . Smokeless tobacco: Never Used  Vaping Use  . Vaping Use: Never used  Substance and Sexual Activity  . Alcohol use: No    Alcohol/week: 0.0 standard drinks  . Drug use: No  . Sexual activity: Not on file  Other Topics Concern  . Not on file  Social History Narrative   Divorced for 6 years,was married 12 years.Lives alone.Works as Corporate treasurer for [employer] .   Social Determinants of Health   Financial Resource Strain:   . Difficulty of Paying Living Expenses: Not on file  Food Insecurity:   . Worried About Charity fundraiser in the Last Year: Not on file  . Ran Out of Food in the Last Year: Not on file  Transportation Needs:   . Lack of Transportation (Medical): Not on file  . Lack of Transportation (Non-Medical): Not on file  Physical Activity:   . Days of Exercise per Week: Not on file  . Minutes of Exercise per Session: Not on file  Stress:   . Feeling of Stress : Not on file  Social Connections:   . Frequency of Communication with Friends and Family: Not on file  . Frequency of Social Gatherings with Friends and Family: Not on file  . Attends Religious Services: Not on file  . Active Member of Clubs or Organizations: Not on file  . Attends Archivist Meetings: Not on file  . Marital Status: Not on file  Intimate Partner Violence:   . Fear of Current or  Ex-Partner: Not on file  . Emotionally Abused: Not on file  . Physically Abused: Not on file  . Sexually Abused: Not on file    Family History  Problem Relation Age of Onset  . Cancer Mother   . Heart disease Father   . Hypertension Other   . Coronary artery disease Sister        died of an MI in her 19's  . Cancer Maternal Grandmother   . Atrial fibrillation Neg Hx   . Diabetes Neg Hx     ROS- All systems are reviewed and negative except as per the HPI above  Physical Exam: Vitals:   11/16/20  1420  BP: 114/84  Pulse: (!) 132  Weight: (!) 141.5 kg  Height: 5' 6"  (1.676 m)   Wt Readings from Last 3 Encounters:  11/16/20 (!) 141.5 kg  11/15/20 (!) 142.6 kg  11/08/20 (!) 139.6 kg    Labs: Lab Results  Component Value Date   NA 138 10/26/2020   K 4.0 10/26/2020   CL 103 10/26/2020   CO2 24 10/26/2020   GLUCOSE 140 (H) 10/26/2020   BUN 23 10/26/2020   CREATININE 1.74 (H) 10/26/2020   CALCIUM 9.2 10/26/2020   MG 2.0 08/07/2017   Lab Results  Component Value Date   INR 1.6 (H) 10/26/2020   Lab Results  Component Value Date   CHOL 185 09/27/2020   HDL 50 09/27/2020   LDLCALC 112 (H) 09/27/2020   TRIG 119 09/27/2020     GEN- The patient is well appearing, alert and oriented x 3 today.   Head- normocephalic, atraumatic Eyes-  Sclera clear, conjunctiva pink Ears- hearing intact Oropharynx- clear Neck- supple, no JVP Lymph- no cervical lymphadenopathy Lungs- Clear to ausculation bilaterally, normal work of breathing Heart- Rapid irregular rate and rhythm, no murmurs, rubs or gallops, PMI not laterally displaced GI- soft, NT, ND, + BS Extremities- no clubbing, cyanosis, or edema MS- no significant deformity or atrophy Skin- no rash or lesion Psych- euthymic mood, full affect Neuro- strength and sensation are intact  EKG-  afib  at 132 bpm, qrs int 86 ms, qtc 496 ms, (HR does slow to the 80's/90's at rest)    Assessment and Plan: 1. H/o of afib,  typical and atypical  atrial flutter  I discussed with Dr. [name] Will plan for an tikosyn admit  Continue 240 mg Cardizem bid to control v rate  No benadryl use Qtc in normal rhythm 440-480 ms,  Dr. [name] aware  Will plan on admission 12/18 Bemt/mag/ covid testing arranged  Will check on price of drug  2. CHA2DS2VASc score of 4 Continue xarelto 20 mg daily,states no missed doses for at least 3 weeks   3. BMI of 49.23 Weight loss encouraged   4. HTN Stable  5. Nonischemic CM EF previously recovered with Sinus rhythm, I would prefer to return her to SR to prevent return to  LV dysfunction     Butch Penny C. Furman [place], [facility] [address] [place], [place] [address] [phone]

## 2020-11-17 ENCOUNTER — Telehealth (HOSPITAL_COMMUNITY): Payer: Self-pay | Admitting: *Deleted

## 2020-11-17 NOTE — Telephone Encounter (Signed)
Pre authorization of inpatient hospital stay was approved by [facility]. Case #[id number] for 11/30/20.

## 2020-11-18 ENCOUNTER — Other Ambulatory Visit (HOSPITAL_COMMUNITY): Payer: Self-pay | Admitting: *Deleted

## 2020-11-18 MED ORDER — POTASSIUM CHLORIDE ER 10 MEQ PO TBCR: 10.0000 meq | EXTENDED_RELEASE_TABLET | Freq: Every day | ORAL | 3 refills | Status: DC

## 2020-11-27 ENCOUNTER — Other Ambulatory Visit (HOSPITAL_COMMUNITY)
Admission: RE | Admit: 2020-11-27 | Discharge: 2020-11-27 | Disposition: A | Discharge status: Home / Self Care | Payer: 59 | Source: Ambulatory Visit | Attending: Internal Medicine | Admitting: Internal Medicine

## 2020-11-27 DIAGNOSIS — Z01812 Encounter for preprocedural laboratory examination: Secondary | ICD-10-CM | POA: Insufficient documentation

## 2020-11-27 DIAGNOSIS — Z20822 Contact with and (suspected) exposure to covid-19: Secondary | ICD-10-CM | POA: Insufficient documentation

## 2020-11-28 LAB — SARS CORONAVIRUS 2 (TAT 6-24 HRS): SARS Coronavirus 2: NEGATIVE

## 2020-11-30 ENCOUNTER — Inpatient Hospital Stay (HOSPITAL_COMMUNITY)
Admission: RE | Admit: 2020-11-30 | Discharge: 2020-12-03 | DRG: 309 | Disposition: A | Discharge status: Home / Self Care | Payer: 59 | Source: Ambulatory Visit | Attending: Internal Medicine | Admitting: Internal Medicine

## 2020-11-30 ENCOUNTER — Other Ambulatory Visit: Payer: Self-pay

## 2020-11-30 ENCOUNTER — Encounter (HOSPITAL_COMMUNITY): Payer: Self-pay | Admitting: Internal Medicine

## 2020-11-30 ENCOUNTER — Encounter (HOSPITAL_COMMUNITY): Payer: Self-pay | Admitting: Nurse Practitioner

## 2020-11-30 ENCOUNTER — Ambulatory Visit (HOSPITAL_COMMUNITY)
Admission: RE | Admit: 2020-11-30 | Discharge: 2020-11-30 | Disposition: A | Discharge status: Home / Self Care | Payer: 59 | Source: Ambulatory Visit | Attending: Nurse Practitioner | Admitting: Nurse Practitioner

## 2020-11-30 VITALS — BP 148/92 | HR 124 | Ht 66.0 in | Wt 309.8 lb

## 2020-11-30 DIAGNOSIS — Z20822 Contact with and (suspected) exposure to covid-19: Secondary | ICD-10-CM | POA: Diagnosis present

## 2020-11-30 DIAGNOSIS — I4819 Other persistent atrial fibrillation: Principal | ICD-10-CM | POA: Diagnosis present

## 2020-11-30 DIAGNOSIS — I484 Atypical atrial flutter: Secondary | ICD-10-CM | POA: Diagnosis present

## 2020-11-30 DIAGNOSIS — I4891 Unspecified atrial fibrillation: Secondary | ICD-10-CM | POA: Diagnosis present

## 2020-11-30 DIAGNOSIS — R040 Epistaxis: Secondary | ICD-10-CM | POA: Diagnosis not present

## 2020-11-30 DIAGNOSIS — D6869 Other thrombophilia: Secondary | ICD-10-CM

## 2020-11-30 DIAGNOSIS — Z8249 Family history of ischemic heart disease and other diseases of the circulatory system: Secondary | ICD-10-CM | POA: Diagnosis not present

## 2020-11-30 DIAGNOSIS — Z6841 Body Mass Index (BMI) 40.0 and over, adult: Secondary | ICD-10-CM | POA: Diagnosis not present

## 2020-11-30 DIAGNOSIS — I10 Essential (primary) hypertension: Secondary | ICD-10-CM | POA: Diagnosis present

## 2020-11-30 DIAGNOSIS — E119 Type 2 diabetes mellitus without complications: Secondary | ICD-10-CM | POA: Diagnosis present

## 2020-11-30 DIAGNOSIS — Z79899 Other long term (current) drug therapy: Secondary | ICD-10-CM | POA: Diagnosis not present

## 2020-11-30 DIAGNOSIS — Z7901 Long term (current) use of anticoagulants: Secondary | ICD-10-CM | POA: Diagnosis not present

## 2020-11-30 DIAGNOSIS — I480 Paroxysmal atrial fibrillation: Secondary | ICD-10-CM | POA: Diagnosis present

## 2020-11-30 DIAGNOSIS — I428 Other cardiomyopathies: Secondary | ICD-10-CM | POA: Diagnosis present

## 2020-11-30 DIAGNOSIS — I340 Nonrheumatic mitral (valve) insufficiency: Secondary | ICD-10-CM | POA: Diagnosis not present

## 2020-11-30 LAB — BASIC METABOLIC PANEL
Anion gap: 9 (ref 5–15)
BUN: 25 mg/dL — ABNORMAL HIGH (ref 8–23)
CO2: 25 mmol/L (ref 22–32)
Calcium: 9.5 mg/dL (ref 8.9–10.3)
Chloride: 106 mmol/L (ref 98–111)
Creatinine, Ser: 1.73 mg/dL — ABNORMAL HIGH (ref 0.44–1.00)
GFR, Estimated: 33 mL/min — ABNORMAL LOW (ref >60)
Glucose, Bld: 194 mg/dL — ABNORMAL HIGH (ref 70–99)
Potassium: 5.3 mmol/L — ABNORMAL HIGH (ref 3.5–5.1)
Sodium: 140 mmol/L (ref 135–145)

## 2020-11-30 LAB — HEMOGLOBIN A1C
Hgb A1c MFr Bld: 7.5 % — ABNORMAL HIGH (ref 4.8–5.6)
Mean Plasma Glucose: 168.55 mg/dL

## 2020-11-30 LAB — MAGNESIUM: Magnesium: 2.2 mg/dL (ref 1.7–2.4)

## 2020-11-30 LAB — GLUCOSE, CAPILLARY: Glucose-Capillary: 129 mg/dL — ABNORMAL HIGH (ref 70–99)

## 2020-11-30 LAB — HIV ANTIBODY (ROUTINE TESTING W REFLEX): HIV Screen 4th Generation wRfx: NONREACTIVE

## 2020-11-30 MED ORDER — DILTIAZEM HCL ER COATED BEADS 240 MG PO CP24
240.0000 mg | ORAL_CAPSULE | Freq: Two times a day (BID) | ORAL | Status: DC
  Administered 2020-11-30 – 2020-12-03 (×6): 240 mg via ORAL
  Filled 2020-11-30 (×6): qty 1

## 2020-11-30 MED ORDER — POTASSIUM CHLORIDE CRYS ER 10 MEQ PO TBCR
10.0000 meq | EXTENDED_RELEASE_TABLET | Freq: Every day | ORAL | Status: DC
  Administered 2020-12-01 – 2020-12-03 (×3): 10 meq via ORAL
  Filled 2020-11-30 (×5): qty 1

## 2020-11-30 MED ORDER — DOFETILIDE 500 MCG PO CAPS
500.0000 ug | ORAL_CAPSULE | Freq: Two times a day (BID) | ORAL | Status: DC
  Administered 2020-11-30 – 2020-12-01 (×2): 500 ug via ORAL
  Filled 2020-11-30 (×2): qty 1

## 2020-11-30 MED ORDER — CARVEDILOL 25 MG PO TABS
25.0000 mg | ORAL_TABLET | Freq: Two times a day (BID) | ORAL | Status: DC
  Administered 2020-11-30 – 2020-12-03 (×6): 25 mg via ORAL
  Filled 2020-11-30 (×6): qty 1

## 2020-11-30 MED ORDER — SODIUM CHLORIDE 0.9 % IV SOLN
250.0000 mL | INTRAVENOUS | Status: DC | PRN
  Administered 2020-12-02: 250 mL via INTRAVENOUS

## 2020-11-30 MED ORDER — INSULIN ASPART 100 UNIT/ML ~~LOC~~ SOLN: 0.0000 [IU] | Freq: Every day | SUBCUTANEOUS | Status: DC

## 2020-11-30 MED ORDER — LEVOTHYROXINE SODIUM 75 MCG PO TABS
150.0000 ug | ORAL_TABLET | Freq: Every day | ORAL | Status: DC
  Administered 2020-12-01 – 2020-12-03 (×3): 150 ug via ORAL
  Filled 2020-11-30 (×3): qty 2

## 2020-11-30 MED ORDER — VITAMIN D 25 MCG (1000 UNIT) PO TABS
1000.0000 [IU] | ORAL_TABLET | Freq: Every day | ORAL | Status: DC
  Administered 2020-12-01 – 2020-12-03 (×3): 1000 [IU] via ORAL
  Filled 2020-11-30 (×3): qty 1

## 2020-11-30 MED ORDER — SODIUM CHLORIDE 0.9% FLUSH: 3.0000 mL | INTRAVENOUS | Status: DC | PRN

## 2020-11-30 MED ORDER — INSULIN ASPART 100 UNIT/ML ~~LOC~~ SOLN
0.0000 [IU] | Freq: Three times a day (TID) | SUBCUTANEOUS | Status: DC
  Administered 2020-12-01: 2 [IU] via SUBCUTANEOUS
  Administered 2020-12-01: 3 [IU] via SUBCUTANEOUS
  Administered 2020-12-02: 2 [IU] via SUBCUTANEOUS
  Administered 2020-12-02: 3 [IU] via SUBCUTANEOUS
  Administered 2020-12-03: 2 [IU] via SUBCUTANEOUS

## 2020-11-30 MED ORDER — SODIUM CHLORIDE 0.9% FLUSH
3.0000 mL | Freq: Two times a day (BID) | INTRAVENOUS | Status: DC
  Administered 2020-12-01 – 2020-12-03 (×3): 3 mL via INTRAVENOUS

## 2020-11-30 MED ORDER — RIVAROXABAN 20 MG PO TABS
20.0000 mg | ORAL_TABLET | Freq: Every day | ORAL | Status: DC
  Administered 2020-11-30 – 2020-12-02 (×3): 20 mg via ORAL
  Filled 2020-11-30 (×3): qty 1

## 2020-11-30 NOTE — Progress Notes (Signed)
MEWS RED for HR and BP.  Protocol initiated.  Dr. Paticia Stack informed.  Patient feeling fine.

## 2020-11-30 NOTE — Progress Notes (Signed)
Pharmacy: Dofetilide (Tikosyn) - Initial Consult Assessment and Electrolyte Replacement  Pharmacy consulted to assist in monitoring and replacing electrolytes in this 64 y.o. female admitted on 11/30/2020 undergoing dofetilide initiation. First dofetilide dose: 12/14  Assessment:  Patient Exclusion Criteria: If any screening criteria checked as "Yes", then  patient  should NOT receive dofetilide until criteria item is corrected.  If "Yes" please indicate correction plan.  YES  NO Patient  Exclusion Criteria Correction Plan   []   [x]   Baseline QTc interval is greater than or equal to 440 msec. IF above YES box checked dofetilide contraindicated unless patient has ICD; then may proceed if QTc 500-550 msec or with known ventricular conduction abnormalities may proceed with QTc 550-600 msec. QTc = 46ms    []   [x]   Patient is known or suspected to have a digoxin level greater than 2 ng/ml: No results found for: DIGOXIN     []   [x]   Creatinine clearance less than 20 ml/min (calculated using Cockcroft-Gault, actual body weight and serum creatinine): Estimated Creatinine Clearance: 49 mL/min (A) (by C-G formula based on SCr of 1.73 mg/dL (H)).     []   [x]  Patient has received drugs known to prolong the QT intervals within the last 48 hours (phenothiazines, tricyclics or tetracyclic antidepressants, erythromycin, H-1 antihistamines, cisapride, fluoroquinolones, azithromycin, ondansetron).   Updated information on QT prolonging agents is available to be searched on the following database:QT prolonging agents     []   [x]   Patient received a dose of hydrochlorothiazide (Oretic) alone or in any combination including triamterene (Dyazide, Maxzide) in the last 48 hours.    []   [x]  Patient received a medication known to increase dofetilide plasma concentrations prior to initial dofetilide dose:  . Trimethoprim (Primsol, Proloprim) in the last 36 hours . Verapamil (Calan, Verelan) in the  last 36 hours or a sustained release dose in the last 72 hours . Megestrol (Megace) in the last 5 days  . Cimetidine (Tagamet) in the last 6 hours . Ketoconazole (Nizoral) in the last 24 hours . Itraconazole (Sporanox) in the last 48 hours  . Prochlorperazine (Compazine) in the last 36 hours     []   [x]   Patient is known to have a history of torsades de pointes; congenital or acquired long QT syndromes.    []   [x]   Patient has received a Class 1 antiarrhythmic with less than 2 half-lives since last dose. (Disopyramide, Quinidine, Procainamide, Lidocaine, Mexiletine, Flecainide, Propafenone)    []   [x]   Patient has received amiodarone therapy in the past 3 months or amiodarone level is greater than 0.3 ng/ml.    Patient has been appropriately anticoagulated with xarelto.  Labs:    Component Value Date/Time   K 5.3 (H) 11/30/2020 1148   MG 2.2 11/30/2020 1148     Plan: Potassium: K >/= 4: Appropriate to initiate Tikosyn, no replacement needed    Magnesium: Mg >2: Appropriate to initiate Tikosyn, no replacement needed     Thank you for allowing pharmacy to participate in this patient's care   Erin Hearing PharmD., BCPS Clinical Pharmacist 11/30/2020 2:56 PM

## 2020-11-30 NOTE — Progress Notes (Signed)
Primary Care Physician: Doree Albee, MD Referring Physician: Dr. Serita Kyle Isabella Hall is a 64 y.o. female with a h/o  Afib, s/p ablation in 2019, CHF, HTN, Mod MR, NICM, DMT2 that is in the afib clinic for f/u recent cardioversion. She  saw Dr. [name]   in the [facility]  clinic 11/5 for typical atrial flutter and she had a successful cardioversion. Ekg now shows atypical atrial flutter vrs coarse afib. She felt improved after CV for around 3 days and then started feeling tired  again. Her v rate is 120 bpm today. Her cardizem was reduced from her usual dose of 240 mg bid to 240 mg daily at time of CV. She had lost down to 260 lb, thru diet and walking several days a week, but got "lazy",  stopped walking, diet slipped  and her weight has now  increased to over 300 lbs again.   F/u in afib clinic for tikosyn admit, 11/30/20. No missed xarelto doses x at least 3 weeks, no benadryl use. Plans to get drug thru [facility]. Qtc today at 463 ms, atrial flutter with variable block at 103 bpm. In rhythm, I have seen qt at 440 ms .  Today, she denies symptoms of palpitations, chest pain, shortness of breath, orthopnea, PND, lower extremity edema, dizziness, presyncope, syncope, or neurologic sequela. The patient is tolerating medications without difficulties and is otherwise without complaint today.   Past Medical History:  Diagnosis Date  . Atrial fibrillation (HCC)     CHADSVASC score 4  . Essential hypertension   . Hypothyroidism   . Obesity   . Type 2 diabetes, diet controlled ([place])   . Typical atrial flutter ([place]) 09/2020   Past Surgical History:  Procedure Laterality Date  . APPENDECTOMY  1979  . ATRIAL FIBRILLATION ABLATION  03/26/2018  . ATRIAL FIBRILLATION ABLATION N/A 03/26/2018   Procedure: ATRIAL FIBRILLATION ABLATION;  Surgeon: Thompson Grayer, MD;  Location: [facility] CV LAB;  Service: Cardiovascular;  Laterality: N/A;  . CARDIOVERSION N/A 01/25/2015   Procedure: CARDIOVERSION;   Surgeon: Satira Sark, MD;  Location: [facility] ORS;  Service: Cardiovascular;  Laterality: N/A;  . CARDIOVERSION N/A 10/28/2020   Procedure: CARDIOVERSION;  Surgeon: Satira Sark, MD;  [facility];  Service: Cardiovascular;  Laterality: N/A;  . [place]  . LEFT HEART CATHETERIZATION WITH CORONARY ANGIOGRAM N/A 01/27/2015   Procedure: LEFT HEART CATHETERIZATION WITH CORONARY ANGIOGRAM;  Surgeon: Sinclair Grooms, MD;  Location: [facility] CATH LAB;  Service: Cardiovascular;  Laterality: N/A;  . TEE WITHOUT CARDIOVERSION N/A 01/25/2015   Procedure: TRANSESOPHAGEAL ECHOCARDIOGRAM (TEE);  Surgeon: Satira Sark, MD;  Location: [facility] ORS;  Service: Cardiovascular;  Laterality: N/A;  . TUBAL LIGATION  1980    Current Outpatient Medications  Medication Sig Dispense Refill  . blood glucose meter kit and supplies KIT Dispense based on patient and insurance preference. Use up to four times daily as directed. (FOR ICD-9 250.00, 250.01). 1 each 0  . Capsicum, Cayenne, (CAYENNE PO) Take 1 Scoop by mouth daily.     . carvedilol (COREG) 25 MG tablet Take 1 tablet (25 mg total) by mouth 2 (two) times daily with a meal. 180 tablet 3  . cholecalciferol (VITAMIN D3) 25 MCG (1000 UNIT) tablet Take 1,000 Units by mouth daily.    Marland Kitchen CINNAMON PO Take 1 capsule by mouth daily.    Marland Kitchen diltiazem (CARDIZEM CD) 240 MG 24 hr capsule Take  1 capsule (240 mg total) by mouth in the morning and at bedtime. 30 capsule 1  . GARLIC PO Take 1 tablet by mouth daily.    . Ginger, Zingiber officinalis, (GINGER EXTRACT PO) Take 1 tablet by mouth daily.     Marland Kitchen glucose blood (ACCU-CHEK AVIVA PLUS) test strip Check your blood glucose daily 100 each 12  . levothyroxine (SYNTHROID) 150 MCG tablet Take 1 tablet (150 mcg total) by mouth daily. 90 tablet 0  . Multiple Vitamin (MULTIVITAMIN WITH MINERALS) TABS tablet Take 1 tablet by mouth daily.     . Multiple Vitamins-Minerals (EMERGEN-C IMMUNE PO) Take 1 tablet by  mouth daily.     . potassium chloride (KLOR-CON) 10 MEQ tablet Take 1 tablet (10 mEq total) by mouth daily. 30 tablet 3  . XARELTO 20 MG TABS tablet TAKE 1 TABLET BY MOUTH ONCE DAILY WITH SUPPER (Patient taking differently: Take 20 mg by mouth daily with supper. ) 30 tablet 6   No current facility-administered medications for this encounter.    No Known Allergies  Social History   Socioeconomic History  . Marital status: Divorced    Spouse name: Not on file  . Number of children: Not on file  . Years of education: Not on file  . Highest education level: Not on file  Occupational History  . 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 Smoking status: Never Smoker  . Smokeless tobacco: Never Used  Vaping Use  . Vaping Use: Never used  Substance and Sexual Activity  . Alcohol use: No    Alcohol/week: 0.0 standard drinks  . Drug use: No  . Sexual activity: Not on file  Other Topics Concern  . Not on file  Social History Narrative   Divorced for 6 years,was married 12 years.Lives alone.Works as Corporate treasurer for [employer] .   Social Determinants of Health   Financial Resource Strain: Not on file  Food Insecurity: Not on file  Transportation Needs: Not on file  Physical Activity: Not on file  Stress: Not on file  Social Connections: Not on file  Intimate Partner Violence: Not on file    Family History  Problem Relation Age of Onset  . Cancer Mother   . Heart disease Father   . Hypertension Other   . Coronary artery disease Sister        died of an MI in her 87's  . Cancer Maternal Grandmother   . Atrial fibrillation Neg Hx   . Diabetes Neg Hx     ROS- All systems are reviewed and negative except as per the HPI above  Physical Exam: There were no vitals filed for this visit. Wt Readings from Last 3 Encounters:  11/16/20 (!) 141.5 kg  11/15/20 (!) 142.6 kg  11/08/20 (!) 139.6 kg    Labs: Lab Results  Component Value Date   NA 138  11/16/2020   K 3.9 11/16/2020   CL 103 11/16/2020   CO2 25 11/16/2020   GLUCOSE 159 (H) 11/16/2020   BUN 25 (H) 11/16/2020   CREATININE 1.54 (H) 11/16/2020   CALCIUM 9.0 11/16/2020   MG 2.2 11/16/2020   Lab Results  Component Value Date   INR 1.6 (H) 10/26/2020   Lab Results  Component Value Date   CHOL 185 09/27/2020   HDL 50 09/27/2020   LDLCALC 112 (H) 09/27/2020   TRIG 119 09/27/2020     GEN- The patient is well appearing, alert and oriented x  3 today.   Head- normocephalic, atraumatic Eyes-  Sclera clear, conjunctiva pink Ears- hearing intact Oropharynx- clear Neck- supple, no JVP Lymph- no cervical lymphadenopathy Lungs- Clear to ausculation bilaterally, normal work of breathing Heart-  irregular rate and rhythm, no murmurs, rubs or gallops, PMI not laterally displaced GI- soft, NT, ND, + BS Extremities- no clubbing, cyanosis, or edema MS- no significant deformity or atrophy Skin- no rash or lesion Psych- euthymic mood, full affect Neuro- strength and sensation are intact  EKG-coarse afib vrs atypical flutter  at 103 bpm, qrs int 86 ms, qtc 463  ms    Assessment and Plan: 1. H/o of afib, typical and atypical  atrial flutter  I discussed with Dr. [name] Dr. [name] feels with her current weight repeat ablation may not be that helpful to pt  He felt antiarrythmic drug may be her best option, tikosyn discussed with pt  Pt is now back for admission No benadryl use, no qt prolonging drugs on board Plans  to get drug thru [facility] No qt prolonging drugs on board  Qt acceptable at 463 ms , in rhythm around 440 ms   2. CHA2DS2VASc score of 4 Continue xarelto 20 mg daily  States no missed doses for at least 3 weeks   3. BMI of 49.23 Weight loss encouraged   4. HTN Stable  5. Nonischemic CM EF previously recovered with Sinus rhythm   To 6 E when bed is available   Butch Penny C. [place], [facility] [address] [place], [place] [address] [phone]

## 2020-11-30 NOTE — H&P (Signed)
Primary Care Physician: Doree Albee, MD Referring Physician: Dr. Serita Kyle Isabella Hall is a 64 y.o. female with a h/o  Afib, s/p ablation in 2019, CHF, HTN, Mod MR, NICM, DMT2 that is in the afib clinic for f/u recent cardioversion. She  saw Dr. [name]   in the [facility]  clinic 11/5 for typical atrial flutter and she had a successful cardioversion. Ekg now shows atypical atrial flutter vrs coarse afib. She felt improved after CV for around 3 days and then started feeling tired  again. Her v rate is 120 bpm today. Her cardizem was reduced from her usual dose of 240 mg bid to 240 mg daily at time of CV. She had lost down to 260 lb, thru diet and walking several days a week, but got "lazy",  stopped walking, diet slipped  and her weight has now  increased to over 300 lbs again.   F/u in afib clinic for tikosyn admit, 11/30/20. No missed xarelto doses x at least 3 weeks, no benadryl use. Plans to get drug thru [facility]. Qtc today at 463 ms, atrial flutter with variable block at 103 bpm. In rhythm, I have seen qt at 440 ms .  Today, she denies symptoms of palpitations, chest pain, shortness of breath, orthopnea, PND, lower extremity edema, dizziness, presyncope, syncope, or neurologic sequela. The patient is tolerating medications without difficulties and is otherwise without complaint today.   Past Medical History:  Diagnosis Date  . Atrial fibrillation (HCC)     CHADSVASC score 4  . Essential hypertension   . Hypothyroidism   . Obesity   . Type 2 diabetes, diet controlled ([place])   . Typical atrial flutter ([place]) 09/2020   Past Surgical History:  Procedure Laterality Date  . APPENDECTOMY  1979  . ATRIAL FIBRILLATION ABLATION  03/26/2018  . ATRIAL FIBRILLATION ABLATION N/A 03/26/2018   Procedure: ATRIAL FIBRILLATION ABLATION;  Surgeon: Thompson Grayer, MD;  Location: [facility] [place] CV LAB;  Service: Cardiovascular;  Laterality: N/A;  . CARDIOVERSION N/A 01/25/2015   Procedure: CARDIOVERSION;   Surgeon: Satira Sark, MD;  Location: [facility] ORS;  Service: Cardiovascular;  Laterality: N/A;  . CARDIOVERSION N/A 10/28/2020   Procedure: CARDIOVERSION;  Surgeon: Satira Sark, MD;  Location: [facility] ENDO SUITE;  Service: Cardiovascular;  Laterality: N/A;  . [place]  . LEFT HEART CATHETERIZATION WITH CORONARY ANGIOGRAM N/A 01/27/2015   Procedure: LEFT HEART CATHETERIZATION WITH CORONARY ANGIOGRAM;  Surgeon: Sinclair Grooms, MD;  Location: [facility] CATH LAB;  Service: Cardiovascular;  Laterality: N/A;  . TEE WITHOUT CARDIOVERSION N/A 01/25/2015   Procedure: TRANSESOPHAGEAL ECHOCARDIOGRAM (TEE);  Surgeon: Satira Sark, MD;  Location: [facility] ORS;  Service: Cardiovascular;  Laterality: N/A;  . TUBAL LIGATION  1980    Current Facility-Administered Medications  Medication Dose Route Frequency Provider Last Rate Last Admin  . 0.9 %  sodium chloride infusion  250 mL Intravenous PRN Sherran Needs, NP      . carvedilol (COREG) tablet 25 mg  25 mg Oral BID WC Sherran Needs, NP      . Derrill Memo ON 12/01/2020] cholecalciferol (VITAMIN D3) tablet 1,000 Units  1,000 Units Oral Daily Sherran Needs, NP      . diltiazem (CARDIZEM CD) 24 hr capsule 240 mg  240 mg Oral BID Sherran Needs, NP      . dofetilide (TIKOSYN) capsule 500 mcg  500 mcg Oral BID Sherran Needs, NP      . [  START ON 12/01/2020] levothyroxine (SYNTHROID) tablet 150 mcg  150 mcg Oral Daily Sherran Needs, NP      . Derrill Memo ON 12/01/2020] potassium chloride (KLOR-CON) CR tablet 10 mEq  10 mEq Oral Daily Sherran Needs, NP      . rivaroxaban Alveda Reasons) tablet 20 mg  20 mg Oral Q supper Roderic Palau C, NP      . sodium chloride flush (NS) 0.9 % injection 3 mL  3 mL Intravenous Q12H Sherran Needs, NP      . sodium chloride flush (NS) 0.9 % injection 3 mL  3 mL Intravenous PRN Sherran Needs, NP        No Known Allergies  Social History   Socioeconomic History  . Marital status: Divorced    Spouse name:  Not on file  . Number of children: Not on file  . Years of education: Not on file  . Highest education level: Not on file  Occupational History  . 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 Smoking status: Never Smoker  . Smokeless tobacco: Never Used  Vaping Use  . Vaping Use: Never used  Substance and Sexual Activity  . Alcohol use: No    Alcohol/week: 0.0 standard drinks  . Drug use: No  . Sexual activity: Not on file  Other Topics Concern  . Not on file  Social History Narrative   Divorced for 6 years,was married 12 years.Lives alone.Works as Corporate treasurer for [employer] .   Social Determinants of Health   Financial Resource Strain: Not on file  Food Insecurity: Not on file  Transportation Needs: Not on file  Physical Activity: Not on file  Stress: Not on file  Social Connections: Not on file  Intimate Partner Violence: Not on file    Family History  Problem Relation Age of Onset  . Cancer Mother   . Heart disease Father   . Hypertension Other   . Coronary artery disease Sister        died of an MI in her 68's  . Cancer Maternal Grandmother   . Atrial fibrillation Neg Hx   . Diabetes Neg Hx     ROS- All systems are reviewed and negative except as per the HPI above  Physical Exam: Vitals:   11/30/20 1411  BP: 117/79  Resp: 16  Temp: 97.6 F (36.4 C)  TempSrc: Oral  SpO2: 98%  Weight: (!) 141.2 kg  Height: 5\' 6"  (1.676 m)   Wt Readings from Last 3 Encounters:  11/30/20 (!) 141.2 kg  11/30/20 (!) 140.5 kg  11/16/20 (!) 141.5 kg    Labs: Lab Results  Component Value Date   NA 140 11/30/2020   K 5.3 (H) 11/30/2020   CL 106 11/30/2020   CO2 25 11/30/2020   GLUCOSE 194 (H) 11/30/2020   BUN 25 (H) 11/30/2020   CREATININE 1.73 (H) 11/30/2020   CALCIUM 9.5 11/30/2020   MG 2.2 11/30/2020   Lab Results  Component Value Date   INR 1.6 (H) 10/26/2020   Lab Results  Component Value Date   CHOL 185 09/27/2020   HDL 50 09/27/2020    LDLCALC 112 (H) 09/27/2020   TRIG 119 09/27/2020     GEN- The patient is well appearing, alert and oriented x 3 today.   Head- normocephalic, atraumatic Eyes-  Sclera clear, conjunctiva pink Ears- hearing intact Oropharynx- clear Neck- supple, no JVP Lymph- no cervical lymphadenopathy Lungs- Clear to ausculation bilaterally,  normal work of breathing Heart-  irregular rate and rhythm, no murmurs, rubs or gallops, PMI not laterally displaced GI- soft, NT, ND, + BS Extremities- no clubbing, cyanosis, or edema MS- no significant deformity or atrophy Skin- no rash or lesion Psych- euthymic mood, full affect Neuro- strength and sensation are intact  EKG-coarse afib vrs atypical flutter  at 103 bpm, qrs int 86 ms, qtc 463  ms    Assessment and Plan: 1. H/o of afib, typical and atypical  atrial flutter  I discussed with Dr. [name] Dr. [name] feels with her current weight repeat ablation may not be that helpful to pt  He felt antiarrythmic drug may be her best option, tikosyn discussed with pt  Pt is now back for admission No benadryl use, no qt prolonging drugs on board Plans  to get drug thru [facility] No qt prolonging drugs on board  Qt acceptable at 463 ms , in rhythm around 440 ms   2. CHA2DS2VASc score of 4 Continue xarelto 20 mg daily  States no missed doses for at least 3 weeks   3. BMI of 49.23 Weight loss encouraged   4. HTN Stable  5. Nonischemic CM EF previously recovered with Sinus rhythm   To 6 E when bed is available   Butch Penny C. Carroll, [facility] [address] [place], [place] [address] [phone]  I have seen, examined the patient, and reviewed the above assessment and plan.  Changes to above are made where necessary.  On exam, iRRR.  The patient has return of symptomatic afib.  She reports compliance with anticoagulation without interruption.  We will admit for initiation of tikosyn.  I discussed at length with patient  today.  We will obtain an echo while here.  Co Sign: Thompson Grayer, MD 11/30/2020 5:23 PM

## 2020-11-30 NOTE — Progress Notes (Signed)
   11/30/20 1411  Assess: MEWS Score  Temp 97.6 F (36.4 C)  BP 117/79  ECG Heart Rate (!) 118  Resp 16  Level of Consciousness Alert  SpO2 98 %  O2 Device Room Air  Assess: MEWS Score  MEWS Temp 0  MEWS Systolic 0  MEWS Pulse 2  MEWS RR 0  MEWS LOC 0  MEWS Score 2  MEWS Score Color Yellow  Assess: if the MEWS score is Yellow or Red  Were vital signs taken at a resting state? Yes  Focused Assessment No change from prior assessment  Early Detection of Sepsis Score *See Row Information* Low  MEWS guidelines implemented *See Row Information* Yes  Treat  MEWS Interventions Escalated (See documentation below)  Pain Scale 0-10  Pain Score 0  Take Vital Signs  Increase Vital Sign Frequency  Yellow: Q 2hr X 2 then Q 4hr X 2, if remains yellow, continue Q 4hrs  Escalate  MEWS: Escalate Yellow: discuss with charge nurse/RN and consider discussing with provider and RRT  Notify: Charge Nurse/RN  Name of Charge Nurse/RN Notified Colletta [place]  Date Charge Nurse/RN Notified 11/30/20  Time Charge Nurse/RN Notified 1440  Document  Patient Outcome Other (Comment) (no interventions)  Progress note created (see row info) Yes

## 2020-12-01 ENCOUNTER — Inpatient Hospital Stay (HOSPITAL_COMMUNITY): Payer: 59

## 2020-12-01 DIAGNOSIS — I4891 Unspecified atrial fibrillation: Secondary | ICD-10-CM

## 2020-12-01 DIAGNOSIS — I340 Nonrheumatic mitral (valve) insufficiency: Secondary | ICD-10-CM

## 2020-12-01 LAB — MAGNESIUM: Magnesium: 2.1 mg/dL (ref 1.7–2.4)

## 2020-12-01 LAB — ECHOCARDIOGRAM COMPLETE
Area-P 1/2: 3.77 cm2
Calc EF: 45.2 %
Height: 66 in
MV M vel: 4.66 m/s
MV Peak grad: 86.9 mmHg
S' Lateral: 4.7 cm
Single Plane A2C EF: 44 %
Single Plane A4C EF: 45 %
Weight: 4931.25 oz

## 2020-12-01 LAB — BASIC METABOLIC PANEL
Anion gap: 8 (ref 5–15)
BUN: 27 mg/dL — ABNORMAL HIGH (ref 8–23)
CO2: 24 mmol/L (ref 22–32)
Calcium: 9 mg/dL (ref 8.9–10.3)
Chloride: 106 mmol/L (ref 98–111)
Creatinine, Ser: 1.7 mg/dL — ABNORMAL HIGH (ref 0.44–1.00)
GFR, Estimated: 34 mL/min — ABNORMAL LOW (ref >60)
Glucose, Bld: 207 mg/dL — ABNORMAL HIGH (ref 70–99)
Potassium: 4.1 mmol/L (ref 3.5–5.1)
Sodium: 138 mmol/L (ref 135–145)

## 2020-12-01 LAB — GLUCOSE, CAPILLARY
Glucose-Capillary: 156 mg/dL — ABNORMAL HIGH (ref 70–99)
Glucose-Capillary: 130 mg/dL — ABNORMAL HIGH (ref 70–99)
Glucose-Capillary: 101 mg/dL — ABNORMAL HIGH (ref 70–99)
Glucose-Capillary: 141 mg/dL — ABNORMAL HIGH (ref 70–99)

## 2020-12-01 MED ORDER — DOFETILIDE 250 MCG PO CAPS
250.0000 ug | ORAL_CAPSULE | Freq: Two times a day (BID) | ORAL | Status: DC
  Administered 2020-12-01: 250 ug via ORAL
  Filled 2020-12-01: qty 1

## 2020-12-01 MED ORDER — PERFLUTREN LIPID MICROSPHERE
1.0000 mL | INTRAVENOUS | Status: AC | PRN
  Administered 2020-12-01: 2 mL via INTRAVENOUS
  Filled 2020-12-01: qty 10

## 2020-12-01 NOTE — Progress Notes (Signed)
Pharmacy: Dofetilide (Tikosyn) - Follow Up Assessment and Electrolyte Replacement  Pharmacy consulted to assist in monitoring and replacing electrolytes in this 64 y.o. female admitted on 11/30/2020 undergoing dofetilide initiation. First dofetilide dose: 11/30/20  Labs:    Component Value Date/Time   K 4.1 12/01/2020 0223   MG 2.1 12/01/2020 0223     Plan: Potassium: K >/= 4: No additional supplementation needed  Magnesium: Mg > 2: No additional supplementation needed  Patient with elevated potassium yesterday on admission, now back to within normal limits, she is prescribed 10 meq daily which we will continue for now.   Thank you for allowing pharmacy to participate in this patient's care  . Erin Hearing PharmD., BCPS Clinical Pharmacist 12/01/2020 9:31 AM

## 2020-12-01 NOTE — Plan of Care (Signed)
  Problem: Activity: Goal: Ability to tolerate increased activity will improve Outcome: Progressing   Problem: Cardiac: Goal: Ability to achieve and maintain adequate cardiopulmonary perfusion will improve Outcome: Progressing   

## 2020-12-01 NOTE — Progress Notes (Signed)
  Echocardiogram 2D Echocardiogram has been performed.  Geoffery Lyons [place] 12/01/2020, 1:57 PM

## 2020-12-01 NOTE — Progress Notes (Signed)
   11/30/20 2055  Assess: MEWS Score  BP (!) 100/53  ECG Heart Rate (!) 136  Assess: MEWS Score  MEWS Temp 0  MEWS Systolic 1  MEWS Pulse 3  MEWS RR 0  MEWS LOC 0  MEWS Score 4  MEWS Score Color Red  Assess: if the MEWS score is Yellow or Red  Were vital signs taken at a resting state? Yes  Focused Assessment No change from prior assessment  Early Detection of Sepsis Score *See Row Information* Low  MEWS guidelines implemented *See Row Information* Yes  Treat  MEWS Interventions Administered scheduled meds/treatments;Escalated (See documentation below)  Take Vital Signs  Increase Vital Sign Frequency  Red: Q 1hr X 4 then Q 4hr X 4, if remains red, continue Q 4hrs  Escalate  MEWS: Escalate Red: discuss with charge nurse/RN and provider, consider discussing with RRT  Notify: Charge Nurse/RN  Name of Charge Nurse/RN Notified Margreta Journey  Date Charge Nurse/RN Notified 11/30/20  Time Charge Nurse/RN Notified 2325  Notify: Provider  Provider Name/Title Akhter  Date Provider Notified 11/30/20  Time Provider Notified 2328  Notification Type  (text)  Notification Reason Change in status  Date of Provider Response 11/30/20  Time of Provider Response 2329  Document  Progress note created (see row info)  (See note.)

## 2020-12-01 NOTE — TOC Benefit Eligibility Note (Signed)
Transition of Care [facility]) Benefit Eligibility Note    Patient Details  Name: Isabella Hall MRN: [id number] Date of Birth: 1956/08/18   Medication/Dose: dofetilide Phyllis Ginger)  Covered?: Yes  Prescription Coverage Preferred Pharmacy: Diannia Ruder with Person/Company/Phone Number:: [facility]  Co-Pay: $68.65 for 30 day retail  Prior Approval: No      Delorse Lek Phone Number: 12/01/2020, 2:05 PM

## 2020-12-01 NOTE — Progress Notes (Signed)
   Progress Note   Subjective   Doing well today, the patient denies CP or SOB.  No new concerns  Inpatient Medications    Scheduled Meds: . carvedilol  25 mg Oral BID WC  . cholecalciferol  1,000 Units Oral Daily  . diltiazem  240 mg Oral BID  . dofetilide  500 mcg Oral BID  . insulin aspart  0-15 Units Subcutaneous TID WC  . insulin aspart  0-5 Units Subcutaneous QHS  . levothyroxine  150 mcg Oral Daily  . potassium chloride  10 mEq Oral Daily  . rivaroxaban  20 mg Oral Q supper  . sodium chloride flush  3 mL Intravenous Q12H   Continuous Infusions: . sodium chloride     PRN Meds: sodium chloride, sodium chloride flush   Vital Signs    Vitals:   11/30/20 2325 12/01/20 0025 12/01/20 0235 12/01/20 0500  BP: 128/82  127/66 123/77  Pulse: (!) 107 93 (!) 107 69  Resp: 20  20 20   Temp: 98.6 F (37 C)  98.7 F (37.1 C) 97.6 F (36.4 C)  TempSrc: Oral  Oral Oral  SpO2: 96% 96% 94% 96%  Weight:    (!) 139.8 kg  Height:       No intake or output data in the 24 hours ending 12/01/20 0628 Filed Weights   11/30/20 1411 12/01/20 0500  Weight: (!) 141.2 kg (!) 139.8 kg    Telemetry    Sinus with rare PVCs - Personally Reviewed  Physical Exam   GEN- The patient is well appearing, alert and oriented x 3 today.   Head- normocephalic, atraumatic Eyes-  Sclera clear, conjunctiva pink Ears- hearing intact Oropharynx- clear Neck- supple, Lungs-  normal work of breathing Heart- Regular rate and rhythm  GI- soft  Extremities- no clubbing, cyanosis, or edema  MS- no significant deformity or atrophy Skin- no rash or lesion Psych- euthymic mood, full affect Neuro- strength and sensation are intact   Labs    Chemistry Recent Labs  Lab 11/30/20 1148 12/01/20 0223  NA 140 138  K 5.3* 4.1  CL 106 106  CO2 25 24  GLUCOSE 194* 207*  BUN 25* 27*  CREATININE 1.73* 1.70*  CALCIUM 9.5 9.0  GFRNONAA 33* 34*  ANIONGAP 9 8     HematologyNo results for input(s):  WBC, RBC, HGB, HCT, MCV, MCH, MCHC, RDW, PLT in the last 168 hours.   Patient ID  Isabella Hall is a 64 y.o. female with a h/o  Afib, s/p ablation in 2019, CHF, HTN, Mod MR, NICM, DMT2 who is admitted for tikosyn load  Assessment & Plan    1.  Persistent afib In sinus after first dose of tikosyn We will need to follow qt closely  2. HTN Stable No change required today  3. Prior nonischemic CM Repeat echo ordered  Thompson Grayer MD, [facility] 12/01/2020 6:28 AM

## 2020-12-01 NOTE — Progress Notes (Addendum)
Morning EKG reviewed  Shows pt remains in NSR at 55 bpm with prolonged QTc ~550 ms range when measured manually  Reviewed personally with Dr. [name]. Reduced dose of Tikosyn 250 mcg BID.   Pt will not require DCCV as she is in NSR. Continue to monitor QT very closely.    Shirley Friar, PA-C  Pager: [phone]  12/01/2020 12:54 PM

## 2020-12-01 NOTE — Care Management (Signed)
1356 12-01-20 Benefits check submitted for Tikosyn. Case Manager will discuss cost and pharmacy of choice. Bethena Roys, RN,BSN Case Manager

## 2020-12-02 LAB — BASIC METABOLIC PANEL
Anion gap: 8 (ref 5–15)
BUN: 32 mg/dL — ABNORMAL HIGH (ref 8–23)
CO2: 26 mmol/L (ref 22–32)
Calcium: 9.4 mg/dL (ref 8.9–10.3)
Chloride: 105 mmol/L (ref 98–111)
Creatinine, Ser: 1.74 mg/dL — ABNORMAL HIGH (ref 0.44–1.00)
GFR, Estimated: 33 mL/min — ABNORMAL LOW (ref >60)
Glucose, Bld: 138 mg/dL — ABNORMAL HIGH (ref 70–99)
Potassium: 4.2 mmol/L (ref 3.5–5.1)
Sodium: 139 mmol/L (ref 135–145)

## 2020-12-02 LAB — GLUCOSE, CAPILLARY
Glucose-Capillary: 160 mg/dL — ABNORMAL HIGH (ref 70–99)
Glucose-Capillary: 170 mg/dL — ABNORMAL HIGH (ref 70–99)
Glucose-Capillary: 130 mg/dL — ABNORMAL HIGH (ref 70–99)
Glucose-Capillary: 161 mg/dL — ABNORMAL HIGH (ref 70–99)

## 2020-12-02 LAB — MAGNESIUM: Magnesium: 2 mg/dL (ref 1.7–2.4)

## 2020-12-02 MED ORDER — OXYMETAZOLINE HCL 0.05 % NA SOLN
1.0000 | Freq: Two times a day (BID) | NASAL | Status: DC
  Administered 2020-12-02: 1 via NASAL
  Filled 2020-12-02: qty 30

## 2020-12-02 MED ORDER — MAGNESIUM SULFATE 2 GM/50ML IV SOLN
2.0000 g | Freq: Once | INTRAVENOUS | Status: AC
  Administered 2020-12-02: 2 g via INTRAVENOUS
  Filled 2020-12-02: qty 50

## 2020-12-02 MED ORDER — DOFETILIDE 125 MCG PO CAPS
125.0000 ug | ORAL_CAPSULE | Freq: Two times a day (BID) | ORAL | Status: DC
  Administered 2020-12-02: 125 ug via ORAL
  Filled 2020-12-02 (×2): qty 1

## 2020-12-02 NOTE — Progress Notes (Signed)
Morning EKG reviewed  Shows remains in NSR at 52 bpm with prolonged QTc at ~530 ms.  Unfortunately, at this point her QT is prohibitive for continuing Tikosyn. We will stop tikosyn, and plan on starting amiodarone in 1 week if agreeable. She will then follow up in the office 2-3 weeks after that to discuss the likelihood of having a successful ablation.   Discussed above with Dr. [name].  We will plan on observation overnight unless pt adamantly wishes to leave and repeat EKG this afternoon were to show improvement in her QT.   Shirley Friar, [place]  Pager: [phone]  12/02/2020 12:27 PM

## 2020-12-02 NOTE — Progress Notes (Signed)
Pharmacy: Dofetilide (Tikosyn) - Follow Up Assessment and Electrolyte Replacement  Pharmacy consulted to assist in monitoring and replacing electrolytes in this 64 y.o. female admitted on 11/30/2020 undergoing dofetilide initiation. First dofetilide dose: 11/30/20  Labs:    Component Value Date/Time   K 4.2 12/02/2020 0144   MG 2.0 12/02/2020 0144     Plan: Potassium: K >/= 4: No additional supplementation needed  Magnesium: Mg 1.8-2: Give Mg 2 gm IV x1   Potassium currently within normal limits, she is prescribed 10 meq daily which we will continue for now. Magnesium is a goal, will give supplement to keep within range. At this point do not expect her to need supplements at discharge.   Thank you for allowing pharmacy to participate in this patient's care  . Erin Hearing PharmD., BCPS Clinical Pharmacist 12/02/2020 7:35 AM

## 2020-12-02 NOTE — Progress Notes (Addendum)
Electrophysiology Rounding Note  Patient Name: Isabella Hall Date of Encounter: 12/02/2020  Primary Cardiologist: No primary care provider on file.  Electrophysiologist: Thompson Grayer, MD    Subjective   Pt remains in NSR on Tikosyn 250 mcg BID   QTc from EKG last pm shows prolonged QTc at ~510-520 ms with a drawn out appearance, especially in inferior leads.   The patient is doing well today.  At this time, the patient denies chest pain, shortness of breath, or any new concerns.  Inpatient Medications    Scheduled Meds: . carvedilol  25 mg Oral BID WC  . cholecalciferol  1,000 Units Oral Daily  . diltiazem  240 mg Oral BID  . dofetilide  125 mcg Oral BID  . insulin aspart  0-15 Units Subcutaneous TID WC  . insulin aspart  0-5 Units Subcutaneous QHS  . levothyroxine  150 mcg Oral Daily  . potassium chloride  10 mEq Oral Daily  . rivaroxaban  20 mg Oral Q supper  . sodium chloride flush  3 mL Intravenous Q12H   Continuous Infusions: . sodium chloride    . magnesium sulfate bolus IVPB     PRN Meds: sodium chloride, sodium chloride flush   Vital Signs    Vitals:   12/01/20 0859 12/01/20 1419 12/01/20 2100 12/02/20 0500  BP: 128/73 130/87 134/71   Pulse:  (!) 57 67 61  Resp:  18 18 18   Temp:  97.8 F (36.6 C) 97.9 F (36.6 C) 97.9 F (36.6 C)  TempSrc:  Oral Oral Oral  SpO2:  97% 98% 98%  Weight:      Height:        Intake/Output Summary (Last 24 hours) at 12/02/2020 0745 Last data filed at 12/01/2020 0900 Gross per 24 hour  Intake 680 ml  Output --  Net 680 ml   Filed Weights   11/30/20 1411 12/01/20 0500  Weight: (!) 141.2 kg (!) 139.8 kg    Physical Exam    GEN- The patient is well appearing, alert and oriented x 3 today.   Head- normocephalic, atraumatic Eyes-  Sclera clear, conjunctiva pink Ears- hearing intact Oropharynx- clear Neck- supple Lungs- Clear to ausculation bilaterally, normal work of breathing Heart- Regular rate and  rhythm, no murmurs, rubs or gallops GI- soft, NT, ND, + BS Extremities- no clubbing, cyanosis, or edema Skin- no rash or lesion Psych- euthymic mood, full affect Neuro- strength and sensation are intact  Labs    CBC No results for input(s): WBC, NEUTROABS, HGB, HCT, MCV, PLT in the last 72 hours. Basic Metabolic Panel Recent Labs    12/01/20 0223 12/02/20 0144  NA 138 139  K 4.1 4.2  CL 106 105  CO2 24 26  GLUCOSE 207* 138*  BUN 27* 32*  CREATININE 1.70* 1.74*  CALCIUM 9.0 9.4  MG 2.1 2.0    Potassium  Date/Time Value Ref Range Status  12/02/2020 01:44 AM 4.2 3.5 - 5.1 mmol/L Final   Magnesium  Date/Time Value Ref Range Status  12/02/2020 01:44 AM 2.0 1.7 - 2.4 mg/dL Final    Comment:    Performed at [facility], [place] [address], [place], [place] [address]    Telemetry    Sinus/NSR [phone] (personally reviewed)  Radiology    ECHOCARDIOGRAM COMPLETE  Result Date: 12/01/2020    ECHOCARDIOGRAM REPORT   Patient Name:   Isabella Hall Date of Exam: 12/01/2020 Medical Rec #:  [id number]  Height:       66.0 in Accession #:    [id number]     Weight:       308.2 lb Date of Birth:  [birth year]      BSA:          2.403 m Patient Age:    64 years       BP:           128/73 mmHg Patient Gender: F              HR:           58 bpm. Exam Location:  Inpatient Procedure: 2D Echo, Cardiac Doppler, Color Doppler and Intracardiac            Opacification Agent Indications:    Atrial Fibrillation 427.31 / I48.91  History:        Patient has prior history of Echocardiogram examinations, most                 recent 08/07/2017. Arrythmias:Atrial Fibrillation and Atrial                 Flutter; Risk Factors:Hypertension, Diabetes and Non-Smoker.  Sonographer:    Vickie Epley RDCS Referring Phys: [id number] [place]  1. Left ventricular ejection fraction, by estimation, is 45 to 50%. The left ventricle has mildly decreased function. The left ventricle demonstrates  global hypokinesis. The left ventricular internal cavity size was moderately dilated. Left ventricular diastolic parameters were normal.  2. Right ventricular systolic function is normal. The right ventricular size is normal. There is normal pulmonary artery systolic pressure. The estimated right ventricular systolic pressure is 8.8 mmHg.  3. The mitral valve is normal in structure. Mild to moderate mitral valve regurgitation. No evidence of mitral stenosis.  4. The aortic valve is normal in structure. Aortic valve regurgitation is not visualized. No aortic stenosis is present.  5. The inferior vena cava is normal in size with greater than 50% respiratory variability, suggesting right atrial pressure of 3 mmHg. FINDINGS  Left Ventricle: Left ventricular ejection fraction, by estimation, is 45 to 50%. The left ventricle has mildly decreased function. The left ventricle demonstrates global hypokinesis. Definity contrast agent was given IV to delineate the left ventricular  endocardial borders. The left ventricular internal cavity size was moderately dilated. There is no left ventricular hypertrophy. Left ventricular diastolic parameters were normal. Normal left ventricular filling pressure. Right Ventricle: The right ventricular size is normal. No increase in right ventricular wall thickness. Right ventricular systolic function is normal. There is normal pulmonary artery systolic pressure. The tricuspid regurgitant velocity is 1.20 m/s, and  with an assumed right atrial pressure of 3 mmHg, the estimated right ventricular systolic pressure is 8.8 mmHg. Left Atrium: Left atrial size was normal in size. Right Atrium: Right atrial size was normal in size. Pericardium: There is no evidence of pericardial effusion. Mitral Valve: The mitral valve is normal in structure. Mild to moderate mitral valve regurgitation. No evidence of mitral valve stenosis. Tricuspid Valve: The tricuspid valve is normal in structure. Tricuspid  valve regurgitation is trivial. No evidence of tricuspid stenosis. Aortic Valve: The aortic valve is normal in structure. Aortic valve regurgitation is not visualized. No aortic stenosis is present. Pulmonic Valve: The pulmonic valve was normal in structure. Pulmonic valve regurgitation is not visualized. No evidence of pulmonic stenosis. Aorta: The aortic root is normal in size and structure. Venous: The inferior vena cava is normal in size with greater  than 50% respiratory variability, suggesting right atrial pressure of 3 mmHg. IAS/Shunts: No atrial level shunt detected by color flow Doppler.  LEFT VENTRICLE PLAX 2D LVIDd:         6.10 cm      Diastology LVIDs:         4.70 cm      LV e' medial:    7.54 cm/s LV PW:         0.80 cm      LV E/e' medial:  14.1 LV IVS:        0.80 cm      LV e' lateral:   10.10 cm/s LVOT diam:     2.20 cm      LV E/e' lateral: 10.5 LV SV:         72 LV SV Index:   30 LVOT Area:     3.80 cm  LV Volumes (MOD) LV vol d, MOD A2C: 135.0 ml LV vol d, MOD A4C: 135.0 ml LV vol s, MOD A2C: 75.6 ml LV vol s, MOD A4C: 74.2 ml LV SV MOD A2C:     59.4 ml LV SV MOD A4C:     135.0 ml LV SV MOD BP:      61.3 ml LEFT ATRIUM             Index LA diam:        5.10 cm 2.12 cm/m LA Vol (A2C):   52.6 ml 21.89 ml/m LA Vol (A4C):   52.2 ml 21.72 ml/m LA Biplane Vol: 56.3 ml 23.43 ml/m  AORTIC VALVE LVOT Vmax:   84.20 cm/s LVOT Vmean:  56.400 cm/s LVOT VTI:    0.189 m  AORTA Ao Root diam: 3.40 cm MITRAL VALVE                TRICUSPID VALVE MV Area (PHT): 3.77 cm     TR Peak grad:   5.8 mmHg MV Decel Time: 201 msec     TR Vmax:        120.00 cm/s MR Peak grad: 86.9 mmHg MR Vmax:      466.00 cm/s   SHUNTS MV E velocity: 106.00 cm/s  Systemic VTI:  0.19 m MV A velocity: 28.70 cm/s   Systemic Diam: 2.20 cm MV E/A ratio:  3.69 Fransico Him MD Electronically signed by Fransico Him MD Signature Date/Time: 12/01/2020/2:37:03 PM    Final      Patient Profile     Isabella Hall is a 64 y.o. female with a  past medical history significant for persistent atrial fibrillation.  They were admitted for tikosyn load.   Assessment & Plan    1. Persistent atrial fibrillation Pt remains in NSR on Tikosyn 250 mcg BID  Continue Xarelto K 4.2, Mg 2.0 CHA2DS2VASC is at least 4.   2. Prolonged QTc QT remains prolonged on decreased dose of 250. Will decrease to 125 mcg BID with am dose and follow close.y   3. Prior NICM Echo 12/01/2020 LVEF 45-50%  Continue coreg 25 mg BID K on admit 5.3. ? Erroneous vs outlier.  If creatinine and K remain stable, reasonable to consider ARB and/or spironolactone. Will monitor for tikosyn load first, as she has had prolonged QT and required dose reductions. Do not want to upset Cr further.    For questions or updates, please contact [place] Please consult [email or web address] for contact info under Cardiology/STEMI.  Signed, Shirley Friar, PA-C  12/02/2020, 7:45 AM  I have reviewed the above assessment and plan.  Changes to above are made where necessary.   Qt is prolonged.  Reduce tikosyn to 179mcg BID.  Co Sign: Thompson Grayer, MD 12/02/2020

## 2020-12-03 LAB — BASIC METABOLIC PANEL
Anion gap: 12 (ref 5–15)
BUN: 34 mg/dL — ABNORMAL HIGH (ref 8–23)
CO2: 23 mmol/L (ref 22–32)
Calcium: 9.3 mg/dL (ref 8.9–10.3)
Chloride: 104 mmol/L (ref 98–111)
Creatinine, Ser: 1.55 mg/dL — ABNORMAL HIGH (ref 0.44–1.00)
GFR, Estimated: 38 mL/min — ABNORMAL LOW (ref >60)
Glucose, Bld: 121 mg/dL — ABNORMAL HIGH (ref 70–99)
Potassium: 4 mmol/L (ref 3.5–5.1)
Sodium: 139 mmol/L (ref 135–145)

## 2020-12-03 LAB — MAGNESIUM: Magnesium: 2.3 mg/dL (ref 1.7–2.4)

## 2020-12-03 LAB — GLUCOSE, CAPILLARY: Glucose-Capillary: 131 mg/dL — ABNORMAL HIGH (ref 70–99)

## 2020-12-03 MED ORDER — LOSARTAN POTASSIUM 25 MG PO TABS: 25.0000 mg | ORAL_TABLET | Freq: Every day | ORAL | 6 refills | Status: DC

## 2020-12-03 MED ORDER — OXYMETAZOLINE HCL 0.05 % NA SOLN: 1.0000 | NASAL | 0 refills | Status: DC | PRN

## 2020-12-03 NOTE — Discharge Summary (Signed)
ELECTROPHYSIOLOGY PROCEDURE DISCHARGE SUMMARY    Patient ID: Isabella Hall,  MRN: [id number], DOB/AGE: 62/09/1956 62 y.o.  Admit date: 11/30/2020 Discharge date: 12/03/2020  Primary Care Physician: Doree Albee, MD  Primary Cardiologist: No primary care provider on file.  Electrophysiologist: Thompson Grayer, MD   Primary Discharge Diagnosis:  1.  Persistent atrial fibrillation status post Tikosyn attempt this admission 2. QT prolongation on tikosyn  Secondary Discharge Diagnosis:  3. Mild systolic dysfunction 4. Morbid obesty  No Known Allergies   Procedures This Admission:  1.  Tikosyn loading  Brief HPI: Isabella Hall is a 64 y.o. female with a past medical history as noted above.  They were referred to EP in the outpatient setting for treatment options of atrial fibrillation.  Risks, benefits, and alternatives to Tikosyn were reviewed with the patient who wished to proceed.    Hospital Course:  The patient was admitted and Tikosyn was initiated.  Renal function and electrolytes were followed during the hospitalization.  Pt had QTc prolongation that did not improve with two decreases of tikosyn dosing. Tikosyn was stopped. QTc improved off tikosyn.  We will see her back in 1 week to start amiodarone, and then with Dr. [name] in 2-3 weeks to discuss other options for her AF.   Encouraged weight loss. Echo this admission shows borderline EF at 45-50%. Pt had a nosebleed evening of 12/02/2020 that was eventually controlled with Afrin soaked guaze. She has had issues of epistaxis due to dryness in the past. She will continue saline spray and use Afrin soaked gauze in the event she is not able to control it by other means. She understands she should not use Afrin regularly. She understand the importance of not missing her Xarelto as she converted after the first dose of Tikosyn and remained in NSR at discharge despite stopping Tikosyn.   Physical Exam: Vitals:   12/02/20  0500 12/02/20 1727 12/03/20 0500 12/03/20 0813  BP:  (!) 156/88 115/77 (!) 128/99  Pulse: 61 61 62   Resp: _0 Temp: 97.9 F (36.6 C) 98.7 F (37.1 C) 97.7 F (36.5 C)   TempSrc: Oral Oral Oral   SpO2: 98% 99%    Weight:   (!) 141.2 kg   Height:        GEN- The patient is well appearing, alert and oriented x 3 today.   HEENT: normocephalic, atraumatic; sclera clear, conjunctiva pink; hearing intact; oropharynx clear; neck supple, no JVP Lymph- no cervical lymphadenopathy Lungs- Clear to ausculation bilaterally, normal work of breathing.  No wheezes, rales, rhonchi Heart- Regular rate and rhythm, no murmurs, rubs or gallops, PMI not laterally displaced GI- soft, non-tender, non-distended, bowel sounds present, no hepatosplenomegaly Extremities- no clubbing, cyanosis, or edema; DP/PT/radial pulses 2+ bilaterally MS- no significant deformity or atrophy Skin- warm and dry, no rash or lesion Psych- euthymic mood, full affect Neuro- strength and sensation are intact   Labs:   Lab Results  Component Value Date   WBC 5.8 09/27/2020   HGB 12.5 09/27/2020   HCT 38.8 09/27/2020   MCV 86.6 09/27/2020   PLT 163 09/27/2020    Recent Labs  Lab 12/03/20 0226  NA 139  K 4.0  CL 104  CO2 23  BUN 34*  CREATININE 1.55*  CALCIUM 9.3  GLUCOSE 121*     Discharge Medications:  Allergies as of 12/03/2020   No Known Allergies     Medication List  TAKE these medications   Accu-Chek Aviva Plus test strip Generic drug: glucose blood Check your blood glucose daily   blood glucose meter kit and supplies Kit Dispense based on patient and insurance preference. Use up to four times daily as directed. (FOR ICD-9 250.00, 250.01).   carvedilol 25 MG tablet Commonly known as: COREG Take 1 tablet (25 mg total) by mouth 2 (two) times daily with a meal.   CAYENNE PO Take 1 Scoop by mouth daily.   cholecalciferol 25 MCG (1000 UNIT) tablet Commonly known as: VITAMIN D3 Take  1,000 Units by mouth daily.   CINNAMON PO Take 1 capsule by mouth daily.   diltiazem 240 MG 24 hr capsule Commonly known as: Cardizem CD Take 1 capsule (240 mg total) by mouth in the morning and at bedtime.   EMERGEN-C IMMUNE PO Take 1 tablet by mouth daily.   GARLIC PO Take 1 tablet by mouth daily.   GINGER EXTRACT PO Take 1 tablet by mouth daily.   levothyroxine 150 MCG tablet Commonly known as: SYNTHROID Take 1 tablet (150 mcg total) by mouth daily.   losartan 25 MG tablet Commonly known as: Cozaar Take 1 tablet (25 mg total) by mouth daily.   multivitamin with minerals Tabs tablet Take 1 tablet by mouth daily.   oxymetazoline 0.05 % nasal spray Commonly known as: AFRIN Place 1 spray into both nostrils as needed for congestion. For Nosebleeds, limit use.   potassium chloride 10 MEQ tablet Commonly known as: KLOR-CON Take 1 tablet (10 mEq total) by mouth daily.   Xarelto 20 MG Tabs tablet Generic drug: rivaroxaban TAKE 1 TABLET BY MOUTH ONCE DAILY WITH SUPPER What changed: See the new instructions.       Disposition:    Follow-up Information    Thompson Grayer, MD Follow up on 12/24/2020.   Specialty: Cardiology Why: at 10 am for 3 weeks post tikosyn admission to discuss options Contact information: [place] [address] [place] Follow up on 12/09/2020.   Specialty: Cardiology Why: at 1100 am to start amiodarone, check labs, EKG Contact information: [address] [id number] [place] [phone]              Duration of Discharge Encounter: Greater than 30 minutes including physician time.  Jacalyn Lefevre, PA-C  12/03/2020 11:17 AM

## 2020-12-03 NOTE — Significant Event (Signed)
Rapid Response Event Note   Reason for Call :  Epistaxis   RRT originally called at 2230 asking for suggestions as to how to stop a nosebleed. Per RN, pt has had a nose bleed for 2 hours. They have try holding pressure, ice, and packing. Pt is on xarelto. VSS.  RRT suggested Afrin soaked gauze packed in nose. RN notified MD and Afrin was ordered.  RRT called back at 2328 because pt's nose was still bleeding.  Initial Focused Assessment:  Pt sitting up in chair in no distress. Pt is alert and oriented. Washcloth on pt's chest with drops of blood on it. Pt is taking afrin soaked gauze out of L nostril-clot attached to the end of the gauze.  Importance of leaving gauze in nose as to not dislodge any clots that may be forming discussed with pt. New Afrin soaked gauze placed in L nostril by patient. Pt instructed to leave gauze in nose. Pt given towels and kleenex to use to spit into or to wipe nose.   Interventions:  Afrin soaked gauze placed in pt's nose  Plan of Care:  Leave gauze in nose and allow bleed to clot. Do not remove gauze unless absolutely necessary. If must remove gauze, replace with a fresh one. Continue to monitor pt closely. Please call RRT if bleeding worsens despite intervention, pt becomes unstable, or if further assistance needed.    Event Summary:   MD Notified: MD notified by bedside RN Call [phone] Arrival [phone] End Time:2350  Dillard Essex, RN

## 2020-12-03 NOTE — Progress Notes (Signed)
Pt had nose bleed beginning around 2000. Multiple interventions tried, including, ice packs, holding pressure and packing with gauze. MD and RRT consulted. Afrin ordered and gauze soaked in afrin placed in both nostrils. Bleeding resolved at approximately 0330.

## 2020-12-03 NOTE — Plan of Care (Signed)
  Problem: Education: Goal: Knowledge of disease or condition will improve Outcome: Completed/Met   Problem: Activity: Goal: Ability to tolerate increased activity will improve Outcome: Completed/Met   Problem: Health Behavior/Discharge Planning: Goal: Ability to safely manage health-related needs after discharge will improve Outcome: Completed/Met

## 2020-12-03 NOTE — Progress Notes (Signed)
NURSING PROGRESS NOTE  Isabella Hall [id number] Discharge Data: 12/03/2020 12:47 PM Attending Provider: Thompson Grayer, MD GOV:[id number], Doristine Johns, MD     [name] to be D/C'd Home per MD order.  Discussed with the patient the After Visit Summary and all questions fully answered. All IV's discontinued with no bleeding noted. All belongings returned to patient for patient to take home.   Last Vital Signs:  Blood pressure (!) 128/99, pulse 62, temperature 97.7 F (36.5 C), temperature source Oral, resp. rate 19, height 5' 6"  (1.676 m), weight (!) 141.2 kg, SpO2 99 %.  Discharge Medication List Allergies as of 12/03/2020   No Known Allergies     Medication List    TAKE these medications   Accu-Chek Aviva Plus test strip Generic drug: glucose blood Check your blood glucose daily   blood glucose meter kit and supplies Kit Dispense based on patient and insurance preference. Use up to four times daily as directed. (FOR ICD-9 250.00, 250.01).   carvedilol 25 MG tablet Commonly known as: COREG Take 1 tablet (25 mg total) by mouth 2 (two) times daily with a meal.   CAYENNE PO Take 1 Scoop by mouth daily.   cholecalciferol 25 MCG (1000 UNIT) tablet Commonly known as: VITAMIN D3 Take 1,000 Units by mouth daily.   CINNAMON PO Take 1 capsule by mouth daily.   diltiazem 240 MG 24 hr capsule Commonly known as: Cardizem CD Take 1 capsule (240 mg total) by mouth in the morning and at bedtime.   EMERGEN-C IMMUNE PO Take 1 tablet by mouth daily.   GARLIC PO Take 1 tablet by mouth daily.   GINGER EXTRACT PO Take 1 tablet by mouth daily.   levothyroxine 150 MCG tablet Commonly known as: SYNTHROID Take 1 tablet (150 mcg total) by mouth daily.   losartan 25 MG tablet Commonly known as: Cozaar Take 1 tablet (25 mg total) by mouth daily.   multivitamin with minerals Tabs tablet Take 1 tablet by mouth daily.   oxymetazoline 0.05 % nasal spray Commonly known as:  AFRIN Place 1 spray into both nostrils as needed for congestion. For Nosebleeds, limit use.   potassium chloride 10 MEQ tablet Commonly known as: KLOR-CON Take 1 tablet (10 mEq total) by mouth daily.   Xarelto 20 MG Tabs tablet Generic drug: rivaroxaban TAKE 1 TABLET BY MOUTH ONCE DAILY WITH SUPPER What changed: See the new instructions.

## 2020-12-03 NOTE — Plan of Care (Signed)
  Problem: Education: Goal: Knowledge of disease or condition will improve Outcome: Completed/Met Goal: Understanding of medication regimen will improve Outcome: Completed/Met Goal: Individualized Educational Video(s) Outcome: Completed/Met   Problem: Activity: Goal: Ability to tolerate increased activity will improve Outcome: Completed/Met   Problem: Cardiac: Goal: Ability to achieve and maintain adequate cardiopulmonary perfusion will improve Outcome: Completed/Met   Problem: Health Behavior/Discharge Planning: Goal: Ability to safely manage health-related needs after discharge will improve Outcome: Completed/Met

## 2020-12-08 ENCOUNTER — Encounter (HOSPITAL_COMMUNITY): Payer: Self-pay | Admitting: Nurse Practitioner

## 2020-12-08 ENCOUNTER — Ambulatory Visit (HOSPITAL_COMMUNITY)
Admission: RE | Admit: 2020-12-08 | Discharge: 2020-12-08 | Disposition: A | Discharge status: Home / Self Care | Payer: 59 | Source: Ambulatory Visit | Attending: Nurse Practitioner | Admitting: Nurse Practitioner

## 2020-12-08 ENCOUNTER — Other Ambulatory Visit: Payer: Self-pay

## 2020-12-08 VITALS — BP 126/80 | HR 53 | Ht 66.0 in | Wt 303.2 lb

## 2020-12-08 DIAGNOSIS — I10 Essential (primary) hypertension: Secondary | ICD-10-CM | POA: Insufficient documentation

## 2020-12-08 DIAGNOSIS — E669 Obesity, unspecified: Secondary | ICD-10-CM | POA: Diagnosis not present

## 2020-12-08 DIAGNOSIS — D6869 Other thrombophilia: Secondary | ICD-10-CM

## 2020-12-08 DIAGNOSIS — Z6841 Body Mass Index (BMI) 40.0 and over, adult: Secondary | ICD-10-CM | POA: Insufficient documentation

## 2020-12-08 DIAGNOSIS — I483 Typical atrial flutter: Secondary | ICD-10-CM | POA: Insufficient documentation

## 2020-12-08 DIAGNOSIS — I4891 Unspecified atrial fibrillation: Secondary | ICD-10-CM | POA: Insufficient documentation

## 2020-12-08 DIAGNOSIS — Z8249 Family history of ischemic heart disease and other diseases of the circulatory system: Secondary | ICD-10-CM | POA: Insufficient documentation

## 2020-12-08 DIAGNOSIS — Z7901 Long term (current) use of anticoagulants: Secondary | ICD-10-CM | POA: Insufficient documentation

## 2020-12-08 DIAGNOSIS — Z79899 Other long term (current) drug therapy: Secondary | ICD-10-CM | POA: Diagnosis not present

## 2020-12-08 DIAGNOSIS — I4819 Other persistent atrial fibrillation: Secondary | ICD-10-CM | POA: Diagnosis not present

## 2020-12-08 MED ORDER — AMIODARONE HCL 200 MG PO TABS: 200.0000 mg | ORAL_TABLET | Freq: Two times a day (BID) | ORAL | 3 refills | Status: DC

## 2020-12-08 MED ORDER — CARVEDILOL 25 MG PO TABS: 12.5000 mg | ORAL_TABLET | Freq: Two times a day (BID) | ORAL | 3 refills | Status: DC

## 2020-12-08 NOTE — Progress Notes (Signed)
Primary Care Physician: Doree Albee, MD Referring Physician: Dr. Serita Kyle Isabella Hall is a 64 y.o. female with a h/o  Afib, s/p ablation in 2019, CHF, HTN, Mod MR, NICM, DMT2 that is in the afib clinic for f/u recent cardioversion. She  saw Dr. [name]   in the [facility]  clinic 11/5 for typical atrial flutter and she had a successful cardioversion. Ekg now shows atypical atrial flutter vrs coarse afib. She felt improved after CV for around 3 days and then started feeling tired  again. Her v rate is 120 bpm today. Her cardizem was reduced from her usual dose of 240 mg bid to 240 mg daily at time of CV. She had lost down to 260 lb, thru diet and walking several days a week, but got "lazy",  stopped walking, diet slipped  and her weight has now  increased to over 300 lbs again.   F/u in afib clinic for tikosyn admit, 11/30/20. No missed xarelto doses x at least 3 weeks, no benadryl use. Plans to get drug thru good rx. Qtc today at 463 ms, atrial flutter with variable block at 103 bpm. In rhythm, I have seen qt at 440 ms .  F/u tikosyn admission,12/08/20.  Unfortunately,  her qr prolonged so tikosyn was stopped. She also had a nosebleed in the hospital that was difficult to control but finally resolved with no further issues. She  is in SR today but d/c instructions say to start amio 200 mg bid today. Her qtc is 418 ms today. She feels better and has gone back to walking and has lost 7 lbs.   Today, she denies symptoms of palpitations, chest pain, shortness of breath, orthopnea, PND, lower extremity edema, dizziness, presyncope, syncope, or neurologic sequela. The patient is tolerating medications without difficulties and is otherwise without complaint today.   Past Medical History:  Diagnosis Date  . Atrial fibrillation (HCC)     CHADSVASC score 4  . Essential hypertension   . Hypothyroidism   . Obesity   . Type 2 diabetes, diet controlled ([place])   . Typical atrial flutter ([place]) 09/2020    Past Surgical History:  Procedure Laterality Date  . APPENDECTOMY  1979  . ATRIAL FIBRILLATION ABLATION  03/26/2018  . ATRIAL FIBRILLATION ABLATION N/A 03/26/2018   Procedure: ATRIAL FIBRILLATION ABLATION;  Surgeon: Thompson Grayer, MD;  Location: [place] CV LAB;  Service: Cardiovascular;  Laterality: N/A;  . CARDIOVERSION N/A 01/25/2015   Procedure: CARDIOVERSION;  Surgeon: Satira Sark, MD;  Location: [facility] ORS;  Service: Cardiovascular;  Laterality: N/A;  . CARDIOVERSION N/A 10/28/2020   Procedure: CARDIOVERSION;  Surgeon: Satira Sark, MD;  Location: [facility] END[facility] SU[facility];  Service: Cardiovascular;  Laterality: N/A;  . [place]  . LEFT HEART CATHETERIZATION WITH CORONARY ANGIOGRAM N/A 01/27/2015   Procedure: LEFT HEART CATHETERIZATION WITH CORONARY ANGIOGRAM;  Surgeon: Sinclair Grooms, MD;  Location: [facility] CATH LAB;  Service: Cardiovascular;  Laterality: N/A;  . TEE WITHOUT CARDIOVERSION N/A 01/25/2015   Procedure: TRANSESOPHAGEAL ECHOCARDIOGRAM (TEE);  Surgeon: Satira Sark, MD;  Location: [facility] ORS;  Service: Cardiovascular;  Laterality: N/A;  . TUBAL LIGATION  1980    Current Outpatient Medications  Medication Sig Dispense Refill  . blood glucose meter kit and supplies KIT Dispense based on patient and insurance preference. Use up to four times daily as directed. (FOR ICD-9 250.00, 250.01). 1 each 0  . Capsicum, Cayenne, (CAYENNE PO) Take 1 Scoop  by mouth daily.     . cholecalciferol (VITAMIN D3) 25 MCG (1000 UNIT) tablet Take 1,000 Units by mouth daily.    Isabella Hall CINNAMON PO Take 1 capsule by mouth daily.    Isabella Hall diltiazem (CARDIZEM CD) 240 MG 24 hr capsule Take 1 capsule (240 mg total) by mouth in the morning and at bedtime. 30 capsule 1  . GARLIC PO Take 1 tablet by mouth daily.    . Ginger, Zingiber officinalis, (GINGER EXTRACT PO) Take 1 tablet by mouth daily.     Isabella Hall glucose blood (ACCU-CHEK AVIVA PLUS) test strip Check your blood glucose daily 100 each 12  .  levothyroxine (SYNTHROID) 150 MCG tablet Take 1 tablet (150 mcg total) by mouth daily. 90 tablet 0  . losartan (COZAAR) 25 MG tablet Take 1 tablet (25 mg total) by mouth daily. 30 tablet 6  . Multiple Vitamin (MULTIVITAMIN WITH MINERALS) TABS tablet Take 1 tablet by mouth daily.     . Multiple Vitamins-Minerals (EMERGEN-C IMMUNE PO) Take 1 tablet by mouth daily.     Isabella Hall oxymetazoline (AFRIN) 0.05 % nasal spray Place 1 spray into both nostrils as needed for congestion. For Nosebleeds, limit use. 30 mL 0  . potassium chloride (KLOR-CON) 10 MEQ tablet Take 1 tablet (10 mEq total) by mouth daily. 30 tablet 3  . XARELTO 20 MG TABS tablet TAKE 1 TABLET BY MOUTH ONCE DAILY WITH SUPPER 30 tablet 6  . amiodarone (PACERONE) 200 MG tablet Take 1 tablet (200 mg total) by mouth 2 (two) times daily. 60 tablet 3  . carvedilol (COREG) 25 MG tablet Take 0.5 tablets (12.5 mg total) by mouth 2 (two) times daily with a meal. 45 tablet 3   No current facility-administered medications for this encounter.    No Known Allergies  Social History   Socioeconomic History  . Marital status: Divorced    Spouse name: Not on file  . Number of children: Not on file  . Years of education: Not on file  . Highest education level: Not on file  Occupational History  . 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 Smoking status: Never Smoker  . Smokeless tobacco: Never Used  Vaping Use  . Vaping Use: Never used  Substance and Sexual Activity  . Alcohol use: No    Alcohol/week: 0.0 standard drinks  . Drug use: No  . Sexual activity: Not on file  Other Topics Concern  . Not on file  Social History Narrative   Divorced for 6 years,was married 12 years.Lives alone.Works as Corporate treasurer for [employer] .   Social Determinants of Health   Financial Resource Strain: Not on file  Food Insecurity: Not on file  Transportation Needs: Not on file  Physical Activity: Not on file  Stress: Not on file  Social  Connections: Not on file  Intimate Partner Violence: Not on file    Family History  Problem Relation Age of Onset  . Cancer Mother   . Heart disease Father   . Hypertension Other   . Coronary artery disease Sister        died of an MI in her 41's  . Cancer Maternal Grandmother   . Atrial fibrillation Neg Hx   . Diabetes Neg Hx     ROS- All systems are reviewed and negative except as per the HPI above  Physical Exam: Vitals:   12/08/20 0910  BP: 126/80  Pulse: (!) 53  Weight: (!) 137.5 kg  Height: _0  (1.676 m)   Wt Readings from Last 3 Encounters:  12/08/20 (!) 137.5 kg  12/03/20 (!) 141.2 kg  11/30/20 (!) 140.5 kg    Labs: Lab Results  Component Value Date   NA 139 12/03/2020   K 4.0 12/03/2020   CL 104 12/03/2020   CO2 23 12/03/2020   GLUCOSE 121 (H) 12/03/2020   BUN 34 (H) 12/03/2020   CREATININE 1.55 (H) 12/03/2020   CALCIUM 9.3 12/03/2020   MG 2.3 12/03/2020   Lab Results  Component Value Date   INR 1.6 (H) 10/26/2020   Lab Results  Component Value Date   CHOL 185 09/27/2020   HDL 50 09/27/2020   LDLCALC 112 (H) 09/27/2020   TRIG 119 09/27/2020     GEN- The patient is well appearing, alert and oriented x 3 today.   Head- normocephalic, atraumatic Eyes-  Sclera clear, conjunctiva pink Ears- hearing intact Oropharynx- clear Neck- supple, no JVP Lymph- no cervical lymphadenopathy Lungs- Clear to ausculation bilaterally, normal work of breathing Heart- regular rate and rhythm, no murmurs, rubs or gallops, PMI not laterally displaced GI- soft, NT, ND, + BS Extremities- no clubbing, cyanosis, or edema MS- no significant deformity or atrophy Skin- no rash or lesion Psych- euthymic mood, full affect Neuro- strength and sensation are intact  EKG-sinus brady at 53 bpm, pr int 150 ms, qrs int 86 ms, qtc 418 ms     Assessment and Plan: 1. H/o of afib, typical and atypical  atrial flutter  Failed tikoyn with prolonged qt  She left the  hospital in SR and remains in SR but the plan is to start amiodarone 200 mg bid today, qtc is acceptable She feels improved  I will decrease carvedilol to 12.5 mg bid as she is slow in the 50;s in SR     2. CHA2DS2VASc score of 4 Continue xarelto 20 mg daily   3. BMI of 49.23 Weight loss encouraged  She has lost 7 lbs and is back to walking daily   4. HTN Controlled   5. Nonischemic CM EF previously recovered with Sinus rhythm   F/u in one week   Butch Penny C. Tylor Gambrill, [facility] [address] [place], [place] [address] [phone]

## 2020-12-08 NOTE — Patient Instructions (Signed)
Start Amiodarone 200mg  twice daily with food  Carvedilol 12.5mg  twice daily

## 2020-12-09 ENCOUNTER — Encounter (HOSPITAL_COMMUNITY): Payer: 59 | Admitting: Nurse Practitioner

## 2020-12-14 ENCOUNTER — Encounter (HOSPITAL_COMMUNITY): Payer: Self-pay | Admitting: Nurse Practitioner

## 2020-12-14 ENCOUNTER — Other Ambulatory Visit: Payer: Self-pay

## 2020-12-14 ENCOUNTER — Ambulatory Visit (HOSPITAL_COMMUNITY)
Admission: RE | Admit: 2020-12-14 | Discharge: 2020-12-14 | Disposition: A | Discharge status: Home / Self Care | Payer: 59 | Source: Ambulatory Visit | Attending: Nurse Practitioner | Admitting: Nurse Practitioner

## 2020-12-14 VITALS — BP 160/96 | HR 59 | Ht 66.0 in | Wt 302.6 lb

## 2020-12-14 DIAGNOSIS — I110 Hypertensive heart disease with heart failure: Secondary | ICD-10-CM | POA: Insufficient documentation

## 2020-12-14 DIAGNOSIS — I509 Heart failure, unspecified: Secondary | ICD-10-CM | POA: Diagnosis not present

## 2020-12-14 DIAGNOSIS — E118 Type 2 diabetes mellitus with unspecified complications: Secondary | ICD-10-CM | POA: Diagnosis not present

## 2020-12-14 DIAGNOSIS — Z7901 Long term (current) use of anticoagulants: Secondary | ICD-10-CM | POA: Insufficient documentation

## 2020-12-14 DIAGNOSIS — I4892 Unspecified atrial flutter: Secondary | ICD-10-CM | POA: Diagnosis not present

## 2020-12-14 DIAGNOSIS — Z8249 Family history of ischemic heart disease and other diseases of the circulatory system: Secondary | ICD-10-CM | POA: Insufficient documentation

## 2020-12-14 DIAGNOSIS — D6869 Other thrombophilia: Secondary | ICD-10-CM

## 2020-12-14 DIAGNOSIS — I4891 Unspecified atrial fibrillation: Secondary | ICD-10-CM | POA: Insufficient documentation

## 2020-12-14 DIAGNOSIS — I4819 Other persistent atrial fibrillation: Secondary | ICD-10-CM | POA: Diagnosis not present

## 2020-12-14 DIAGNOSIS — Z79899 Other long term (current) drug therapy: Secondary | ICD-10-CM | POA: Diagnosis not present

## 2020-12-14 MED ORDER — DILTIAZEM HCL ER COATED BEADS 240 MG PO CP24: 240.0000 mg | ORAL_CAPSULE | Freq: Every day | ORAL | Status: DC

## 2020-12-14 NOTE — Progress Notes (Signed)
Primary Care Physician: Doree Albee, MD Referring Physician: Dr. Serita Kyle Isabella Hall is a 64 y.o. female with a h/o  Afib, s/p ablation in 2019, CHF, HTN, Mod MR, NICM, DMT2 that is in the afib clinic for f/u recent cardioversion. She  saw Dr. [name]   in the [facility]  clinic 11/5 for typical atrial flutter and she had a successful cardioversion. Ekg now shows atypical atrial flutter vrs coarse afib. She felt improved after CV for around 3 days and then started feeling tired  again. Her v rate is 120 bpm today. Her cardizem was reduced from her usual dose of 240 mg bid to 240 mg daily at time of CV. She had lost down to 260 lb, thru diet and walking several days a week, but got "lazy",  stopped walking, diet slipped  and her weight has now  increased to over 300 lbs again.   F/u in afib clinic for tikosyn admit, 11/30/20. No missed xarelto doses x at least 3 weeks, no benadryl use. Plans to get drug thru good rx. Qtc today at 463 ms, atrial flutter with variable block at 103 bpm. In rhythm, I have seen qt at 440 ms .  F/u tikosyn admission,12/08/20.  Unfortunately,  her qr prolonged so tikosyn was stopped. She also had a nosebleed in the hospital that was difficult to control but finally resolved with no further issues. She  is in SR today but d/c instructions say to start amio 200 mg bid today. Her qtc is 418 ms today. She feels better and has gone back to walking and has lost 7 lbs.   Pt is here for f/u, 12/14/20, after being on amiodarone 200 mg bid, started last week. She failed Tikosyn admit for prolonged qt. EKG shows sinus brady at 59 bpm, qt stable at 451 ms and has lost several more pounds as she now feels like walking for exercise. She is tolerating amio well.   Today, she denies symptoms of palpitations, chest pain, shortness of breath, orthopnea, PND, lower extremity edema, dizziness, presyncope, syncope, or neurologic sequela. The patient is tolerating medications without  difficulties and is otherwise without complaint today.   Past Medical History:  Diagnosis Date  . Atrial fibrillation (HCC)     CHADSVASC score 4  . Essential hypertension   . Hypothyroidism   . Obesity   . Type 2 diabetes, diet controlled ([place])   . Typical atrial flutter ([place]) 09/2020   Past Surgical History:  Procedure Laterality Date  . APPENDECTOMY  1979  . ATRIAL FIBRILLATION ABLATION  03/26/2018  . ATRIAL FIBRILLATION ABLATION N/A 03/26/2018   Procedure: ATRIAL FIBRILLATION ABLATION;  Surgeon: Thompson Grayer, MD;  Location: [place] CV LAB;  Service: Cardiovascular;  Laterality: N/A;  . CARDIOVERSION N/A 01/25/2015   Procedure: CARDIOVERSION;  Surgeon: Satira Sark, MD;  Location: [facility];  Service: Cardiovascular;  Laterality: N/A;  . CARDIOVERSION N/A 10/28/2020   Procedure: CARDIOVERSION;  Surgeon: Satira Sark, MD;  [facility];  Service: Cardiovascular;  Laterality: N/A;  . [place]  . LEFT HEART CATHETERIZATION WITH CORONARY ANGIOGRAM N/A 01/27/2015   Procedure: LEFT HEART CATHETERIZATION WITH CORONARY ANGIOGRAM;  Surgeon: Sinclair Grooms, MD;  Location: [facility] CATH LAB;  Service: Cardiovascular;  Laterality: N/A;  . TEE WITHOUT CARDIOVERSION N/A 01/25/2015   Procedure: TRANSESOPHAGEAL ECHOCARDIOGRAM (TEE);  Surgeon: Satira Sark, MD;  Location: [facility];  Service: Cardiovascular;  Laterality: N/A;  .  TUBAL LIGATION  1980    Current Outpatient Medications  Medication Sig Dispense Refill  . amiodarone (PACERONE) 200 MG tablet Take 1 tablet (200 mg total) by mouth 2 (two) times daily. 60 tablet 3  . blood glucose meter kit and supplies KIT Dispense based on patient and insurance preference. Use up to four times daily as directed. (FOR ICD-9 250.00, 250.01). 1 each 0  . Capsicum, Cayenne, (CAYENNE PO) Take 1 Scoop by mouth daily.     . carvedilol (COREG) 25 MG tablet Take 0.5 tablets (12.5 mg total) by mouth 2 (two) times daily with a  meal. 45 tablet 3  . cholecalciferol (VITAMIN D3) 25 MCG (1000 UNIT) tablet Take 1,000 Units by mouth daily.    Marland Kitchen CINNAMON PO Take 1 capsule by mouth daily.    Marland Kitchen diltiazem (CARDIZEM CD) 240 MG 24 hr capsule Take 1 capsule (240 mg total) by mouth in the morning and at bedtime. 30 capsule 1  . GARLIC PO Take 1 tablet by mouth daily.    . Ginger, Zingiber officinalis, (GINGER EXTRACT PO) Take 1 tablet by mouth daily.     Marland Kitchen glucose blood (ACCU-CHEK AVIVA PLUS) test strip Check your blood glucose daily 100 each 12  . levothyroxine (SYNTHROID) 150 MCG tablet Take 1 tablet (150 mcg total) by mouth daily. 90 tablet 0  . losartan (COZAAR) 25 MG tablet Take 1 tablet (25 mg total) by mouth daily. 30 tablet 6  . Multiple Vitamin (MULTIVITAMIN WITH MINERALS) TABS tablet Take 1 tablet by mouth daily.     . Multiple Vitamins-Minerals (EMERGEN-C IMMUNE PO) Take 1 tablet by mouth daily.     Marland Kitchen oxymetazoline (AFRIN) 0.05 % nasal spray Place 1 spray into both nostrils as needed for congestion. For Nosebleeds, limit use. 30 mL 0  . potassium chloride (KLOR-CON) 10 MEQ tablet Take 1 tablet (10 mEq total) by mouth daily. 30 tablet 3  . XARELTO 20 MG TABS tablet TAKE 1 TABLET BY MOUTH ONCE DAILY WITH SUPPER 30 tablet 6   No current facility-administered medications for this encounter.    No Known Allergies  Social History   Socioeconomic History  . Marital status: Divorced    Spouse name: Not on file  . Number of children: Not on file  . Years of education: Not on file  . Highest education level: Not on file  Occupational History  . 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 Smoking status: Never Smoker  . Smokeless tobacco: Never Used  Vaping Use  . Vaping Use: Never used  Substance and Sexual Activity  . Alcohol use: No    Alcohol/week: 0.0 standard drinks  . Drug use: No  . Sexual activity: Not on file  Other Topics Concern  . Not on file  Social History Narrative    Divorced for 6 years,was married 12 years.Lives alone.Works as Corporate treasurer for [employer] .   Social Determinants of Health   Financial Resource Strain: Not on file  Food Insecurity: Not on file  Transportation Needs: Not on file  Physical Activity: Not on file  Stress: Not on file  Social Connections: Not on file  Intimate Partner Violence: Not on file    Family History  Problem Relation Age of Onset  . Cancer Mother   . Heart disease Father   . Hypertension Other   . Coronary artery disease Sister        died of an MI in her 14's  .  Cancer Maternal Grandmother   . Atrial fibrillation Neg Hx   . Diabetes Neg Hx     ROS- All systems are reviewed and negative except as per the HPI above  Physical Exam: There were no vitals filed for this visit. Wt Readings from Last 3 Encounters:  12/08/20 (!) 137.5 kg  12/03/20 (!) 141.2 kg  11/30/20 (!) 140.5 kg    Labs: Lab Results  Component Value Date   NA 139 12/03/2020   K 4.0 12/03/2020   CL 104 12/03/2020   CO2 23 12/03/2020   GLUCOSE 121 (H) 12/03/2020   BUN 34 (H) 12/03/2020   CREATININE 1.55 (H) 12/03/2020   CALCIUM 9.3 12/03/2020   MG 2.3 12/03/2020   Lab Results  Component Value Date   INR 1.6 (H) 10/26/2020   Lab Results  Component Value Date   CHOL 185 09/27/2020   HDL 50 09/27/2020   LDLCALC 112 (H) 09/27/2020   TRIG 119 09/27/2020     GEN- The patient is well appearing, alert and oriented x 3 today.   Head- normocephalic, atraumatic Eyes-  Sclera clear, conjunctiva pink Ears- hearing intact Oropharynx- clear Neck- supple, no JVP Lymph- no cervical lymphadenopathy Lungs- Clear to ausculation bilaterally, normal work of breathing Heart- regular rate and rhythm, no murmurs, rubs or gallops, PMI not laterally displaced GI- soft, NT, ND, + BS Extremities- no clubbing, cyanosis, or edema MS- no significant deformity or atrophy Skin- no rash or lesion Psych- euthymic mood, full affect Neuro- strength and  sensation are intact  EKG-sinus brady at 53 bpm, pr int 150 ms, qrs int 86 ms, qtc 418 ms   Echo- 12/01/20-1. Left ventricular ejection fraction, by estimation, is 45 to 50%. The left ventricle has mildly decreased function. The left ventricle demonstrates global hypokinesis. The left ventricular internal cavity size was moderately dilated. Left ventricular diastolic parameters were normal. 2. Right ventricular systolic function is normal. The right ventricular size is normal. There is normal pulmonary artery systolic pressure. The estimated right ventricular systolic pressure is 8.8 mmHg. 3. The mitral valve is normal in structure. Mild to moderate mitral valve regurgitation. No evidence of mitral stenosis. 4. The aortic valve is normal in structure. Aortic valve regurgitation is not visualized. No aortic stenosis is present. 5. The inferior vena cava is normal in size with greater than 50% respiratory variability, suggesting right atrial pressure of 3 mmHg.  Assessment and Plan: 1. H/o of afib, typical and atypical  atrial flutter  Failed tikoyn with prolonged qt  She left the hospital in SR and remains in SR but the plan was to start amiodarone 200 mg bid, now on x one week  She feels improved  Continue on  Carvedilol 12.5 mg bid, CCB 240 mg  1x a day   2. CHA2DS2VASc score of 4 Continue xarelto 20 mg daily   3. BMI of 49.23 Weight loss encouraged  She has lost  10-12 lbs by being  back to walking daily   4. HTN Slightly  elevated today  Was started on losartan 25 mg daily on d/c from hospital  If continues to be elevated with weight loss, consider increasing to 50 mg daily  Rechecked at 156/90  5. Nonischemic CM EF mildly reduced to 45-50% on echo done recently Being back in [place] should help to normalize   F/u with Dr. [name] 12/25/19 afib clinic as needed   Butch Penny C. [facility], [facility] [place], [place]  [address] [phone]

## 2020-12-21 ENCOUNTER — Other Ambulatory Visit (HOSPITAL_COMMUNITY): Payer: Self-pay | Admitting: *Deleted

## 2020-12-24 ENCOUNTER — Encounter: Payer: Self-pay | Admitting: Internal Medicine

## 2020-12-24 ENCOUNTER — Ambulatory Visit (INDEPENDENT_AMBULATORY_CARE_PROVIDER_SITE_OTHER): Payer: 59 | Admitting: Internal Medicine

## 2020-12-24 VITALS — BP 130/88 | HR 61 | Resp 16 | Ht 66.0 in | Wt 306.2 lb

## 2020-12-24 DIAGNOSIS — I4819 Other persistent atrial fibrillation: Secondary | ICD-10-CM

## 2020-12-24 DIAGNOSIS — I10 Essential (primary) hypertension: Secondary | ICD-10-CM

## 2020-12-24 NOTE — Progress Notes (Signed)
PCP: Doree Albee, MD Primary Cardiologist: previously Dr [name] Primary EP: Dr Serita Kyle Isabella Hall is a 65 y.o. female who presents today for routine electrophysiology followup.  Since last being seen in our clinic, the patient reports doing very well.  She feels "much better" with sinus.  SOB is resolved.  Today, she denies symptoms of palpitations, chest pain,   lower extremity edema, dizziness, presyncope, or syncope.  The patient is otherwise without complaint today.   Past Medical History:  Diagnosis Date  . Atrial fibrillation (HCC)     CHADSVASC score 4  . Essential hypertension   . Hypothyroidism   . Obesity   . Type 2 diabetes, diet controlled ([place])   . Typical atrial flutter ([place]) 09/2020   Past Surgical History:  Procedure Laterality Date  . APPENDECTOMY  1979  . ATRIAL FIBRILLATION ABLATION  03/26/2018  . ATRIAL FIBRILLATION ABLATION N/A 03/26/2018   Procedure: ATRIAL FIBRILLATION ABLATION;  Surgeon: Thompson Grayer, MD;  Location: [place] CV LAB;  Service: Cardiovascular;  Laterality: N/A;  . CARDIOVERSION N/A 01/25/2015   Procedure: CARDIOVERSION;  Surgeon: Satira Sark, MD;  Location: [facility];  Service: Cardiovascular;  Laterality: N/A;  . CARDIOVERSION N/A 10/28/2020   Procedure: CARDIOVERSION;  Surgeon: Satira Sark, MD;  [facility];  Service: Cardiovascular;  Laterality: N/A;  . [place]  . LEFT HEART CATHETERIZATION WITH CORONARY ANGIOGRAM N/A 01/27/2015   Procedure: LEFT HEART CATHETERIZATION WITH CORONARY ANGIOGRAM;  Surgeon: Sinclair Grooms, MD;  Location: [facility] CATH LAB;  Service: Cardiovascular;  Laterality: N/A;  . TEE WITHOUT CARDIOVERSION N/A 01/25/2015   Procedure: TRANSESOPHAGEAL ECHOCARDIOGRAM (TEE);  Surgeon: Satira Sark, MD;  Location: [facility];  Service: Cardiovascular;  Laterality: N/A;  . [place] are reviewed and negatives except as per HPI above  Current  Outpatient Medications  Medication Sig Dispense Refill  . amiodarone (PACERONE) 200 MG tablet Take 1 tablet (200 mg total) by mouth 2 (two) times daily. 60 tablet 3  . blood glucose meter kit and supplies KIT Dispense based on patient and insurance preference. Use up to four times daily as directed. (FOR ICD-9 250.00, 250.01). 1 each 0  . Capsicum, Cayenne, (CAYENNE PO) Take 1 Scoop by mouth daily.     . carvedilol (COREG) 25 MG tablet Take 0.5 tablets (12.5 mg total) by mouth 2 (two) times daily with a meal. 45 tablet 3  . cholecalciferol (VITAMIN D3) 25 MCG (1000 UNIT) tablet Take 1,000 Units by mouth daily.    Marland Kitchen CINNAMON PO Take 1 capsule by mouth daily.    Marland Kitchen diltiazem (CARDIZEM CD) 240 MG 24 hr capsule Take 1 capsule (240 mg total) by mouth daily.    Marland Kitchen GARLIC PO Take 1 tablet by mouth daily.    . Ginger, Zingiber officinalis, (GINGER EXTRACT PO) Take 1 tablet by mouth daily.     Marland Kitchen glucose blood (ACCU-CHEK AVIVA PLUS) test strip Check your blood glucose daily 100 each 12  . levothyroxine (SYNTHROID) 150 MCG tablet Take 1 tablet (150 mcg total) by mouth daily. 90 tablet 0  . losartan (COZAAR) 25 MG tablet Take 1 tablet (25 mg total) by mouth daily. 30 tablet 6  . Multiple Vitamin (MULTIVITAMIN WITH MINERALS) TABS tablet Take 1 tablet by mouth daily.     . Multiple Vitamins-Minerals (EMERGEN-C IMMUNE PO) Take 1 tablet by mouth daily.     Marland Kitchen  oxymetazoline (AFRIN) 0.05 % nasal spray Place 1 spray into both nostrils as needed for congestion. For Nosebleeds, limit use. 30 mL 0  . XARELTO 20 MG TABS tablet TAKE 1 TABLET BY MOUTH ONCE DAILY WITH SUPPER 30 tablet 6   No current facility-administered medications for this visit.    Physical Exam: Vitals:   12/24/20 1018  BP: 130/88  Pulse: 61  Resp: 16  SpO2: 99%  Weight: (!) 306 lb 3.2 oz (138.9 kg)  Height: 5' 6"  (1.676 m)    GEN- The patient is well appearing, alert and oriented x 3 today.   Head- normocephalic, atraumatic Eyes-  Sclera  clear, conjunctiva pink Ears- hearing intact Oropharynx- clear Lungs-  , normal work of breathing Heart- Regular rate and rhythm,   GI- soft, NT, ND, + BS Extremities- no clubbing, cyanosis, or edema  Wt Readings from Last 3 Encounters:  12/24/20 (!) 306 lb 3.2 oz (138.9 kg)  12/14/20 (!) 302 lb 9.6 oz (137.3 kg)  12/08/20 (!) 303 lb 3.2 oz (137.5 kg)    Echo 12/01/20- EF 40-45%, mild to moderate MR, normal LA size  EKG tracing ordered today is personally reviewed and shows sinus with PVCs  Assessment and Plan:  1. Persistent afib Did not tolerate tikosyn due to qt prolongation on loading Now on amiodarone 233m daily We discussed ablation as an option. She would prefer to continue amiodarone for now. Return to see me in 6 months.  I may be able to reduce amiodarone to 108mdaily then Reports compliance with xarelto  2. Morbid obesity Body mass index is 49.42 kg/m. Lifestyle modification again advised  3. Nonischemic CM Likely tachycardia mediated Echo reviewed  4. HTN Stable No change required today   Risks, benefits and potential toxicities for medications prescribed and/or refilled reviewed with patient today.   Return to AF clinic in 3 months Return to see me in 6 months  JaThompson GrayerD, FAIredell Memorial Hospital, Incorporated/06/2021 10:35 AM

## 2020-12-24 NOTE — Patient Instructions (Addendum)
Medication Instructions:   Your physician recommends that you continue on your current medications as directed. Please refer to the Current Medication list given to you today.  Labwork:  None  Testing/Procedures:  None  Follow-Up:  Your physician recommends that you schedule a follow-up appointment in: 3 months in the A-Fib Clinic  Your physician recommends that you schedule a follow-up appointment in: 6 months with Dr. [name].  Any Other Special Instructions Will Be Listed Below (If Applicable).  If you need a refill on your cardiac medications before your next appointment, please call your pharmacy.

## 2020-12-30 ENCOUNTER — Other Ambulatory Visit (INDEPENDENT_AMBULATORY_CARE_PROVIDER_SITE_OTHER): Payer: Self-pay | Admitting: Nurse Practitioner

## 2020-12-30 DIAGNOSIS — E039 Hypothyroidism, unspecified: Secondary | ICD-10-CM

## 2021-02-14 ENCOUNTER — Ambulatory Visit (INDEPENDENT_AMBULATORY_CARE_PROVIDER_SITE_OTHER): Payer: 59 | Admitting: Internal Medicine

## 2021-03-14 ENCOUNTER — Ambulatory Visit (INDEPENDENT_AMBULATORY_CARE_PROVIDER_SITE_OTHER): Payer: 59 | Admitting: Internal Medicine

## 2021-03-18 ENCOUNTER — Other Ambulatory Visit: Payer: Self-pay | Admitting: Internal Medicine

## 2021-03-24 ENCOUNTER — Ambulatory Visit (HOSPITAL_COMMUNITY): Payer: 59 | Admitting: Nurse Practitioner

## 2021-03-25 ENCOUNTER — Other Ambulatory Visit (INDEPENDENT_AMBULATORY_CARE_PROVIDER_SITE_OTHER): Payer: Self-pay | Admitting: Internal Medicine

## 2021-03-25 DIAGNOSIS — E039 Hypothyroidism, unspecified: Secondary | ICD-10-CM

## 2021-04-07 ENCOUNTER — Other Ambulatory Visit (HOSPITAL_COMMUNITY): Payer: Self-pay | Admitting: Nurse Practitioner

## 2021-04-18 ENCOUNTER — Ambulatory Visit (INDEPENDENT_AMBULATORY_CARE_PROVIDER_SITE_OTHER): Payer: 59 | Admitting: Internal Medicine

## 2021-04-27 ENCOUNTER — Encounter (HOSPITAL_COMMUNITY): Payer: Self-pay

## 2021-04-27 ENCOUNTER — Ambulatory Visit (HOSPITAL_COMMUNITY): Payer: 59 | Admitting: Nurse Practitioner

## 2021-05-11 ENCOUNTER — Ambulatory Visit (INDEPENDENT_AMBULATORY_CARE_PROVIDER_SITE_OTHER): Payer: 59 | Admitting: Internal Medicine

## 2021-05-30 ENCOUNTER — Telehealth (INDEPENDENT_AMBULATORY_CARE_PROVIDER_SITE_OTHER): Payer: Self-pay

## 2021-05-30 ENCOUNTER — Other Ambulatory Visit: Payer: Self-pay

## 2021-05-30 ENCOUNTER — Other Ambulatory Visit (INDEPENDENT_AMBULATORY_CARE_PROVIDER_SITE_OTHER): Payer: Self-pay | Admitting: Internal Medicine

## 2021-05-30 ENCOUNTER — Other Ambulatory Visit (HOSPITAL_COMMUNITY): Payer: Self-pay

## 2021-05-30 DIAGNOSIS — E039 Hypothyroidism, unspecified: Secondary | ICD-10-CM

## 2021-05-30 MED ORDER — AMIODARONE HCL 200 MG PO TABS: 200.0000 mg | ORAL_TABLET | Freq: Every day | ORAL | 0 refills | Status: DC

## 2021-05-30 MED ORDER — LEVOTHYROXINE SODIUM 150 MCG PO TABS
150.0000 ug | ORAL_TABLET | Freq: Every day | ORAL | 0 refills | Status: DC
Start: 2021-05-30 — End: 2021-07-21

## 2021-05-30 MED ORDER — LOSARTAN POTASSIUM 25 MG PO TABS: 25.0000 mg | ORAL_TABLET | Freq: Every day | ORAL | 2 refills | Status: DC

## 2021-05-30 NOTE — Telephone Encounter (Signed)
Received a fax from [facility] requesting a refill of the following medication:  levothyroxine (SYNTHROID) 150 MCG tablet   Last filled 03/26/2021, # 90 with 0 refills  Last OV 11/15/2020  Next OV 06/13/2021

## 2021-06-09 ENCOUNTER — Ambulatory Visit (HOSPITAL_COMMUNITY)
Admission: RE | Admit: 2021-06-09 | Discharge: 2021-06-09 | Disposition: A | Discharge status: Home / Self Care | Payer: 59 | Source: Ambulatory Visit | Attending: Nurse Practitioner | Admitting: Nurse Practitioner

## 2021-06-09 ENCOUNTER — Other Ambulatory Visit: Payer: Self-pay

## 2021-06-09 VITALS — BP 146/98 | HR 123 | Ht 66.0 in | Wt 331.2 lb

## 2021-06-09 DIAGNOSIS — I4892 Unspecified atrial flutter: Secondary | ICD-10-CM | POA: Insufficient documentation

## 2021-06-09 DIAGNOSIS — E118 Type 2 diabetes mellitus with unspecified complications: Secondary | ICD-10-CM | POA: Insufficient documentation

## 2021-06-09 DIAGNOSIS — I110 Hypertensive heart disease with heart failure: Secondary | ICD-10-CM | POA: Insufficient documentation

## 2021-06-09 DIAGNOSIS — I4891 Unspecified atrial fibrillation: Secondary | ICD-10-CM | POA: Diagnosis not present

## 2021-06-09 DIAGNOSIS — Z8249 Family history of ischemic heart disease and other diseases of the circulatory system: Secondary | ICD-10-CM | POA: Diagnosis not present

## 2021-06-09 DIAGNOSIS — I4819 Other persistent atrial fibrillation: Secondary | ICD-10-CM | POA: Diagnosis not present

## 2021-06-09 DIAGNOSIS — D6869 Other thrombophilia: Secondary | ICD-10-CM | POA: Diagnosis not present

## 2021-06-09 DIAGNOSIS — I509 Heart failure, unspecified: Secondary | ICD-10-CM | POA: Diagnosis not present

## 2021-06-09 DIAGNOSIS — Z79899 Other long term (current) drug therapy: Secondary | ICD-10-CM | POA: Diagnosis not present

## 2021-06-09 DIAGNOSIS — Z7901 Long term (current) use of anticoagulants: Secondary | ICD-10-CM | POA: Diagnosis not present

## 2021-06-09 LAB — HEPATIC FUNCTION PANEL
ALT: 20 U/L (ref 0–44)
AST: 19 U/L (ref 15–41)
Albumin: 3.2 g/dL — ABNORMAL LOW (ref 3.5–5.0)
Alkaline Phosphatase: 43 U/L (ref 38–126)
Bilirubin, Direct: <0.1 mg/dL (ref 0.0–0.2)
Total Bilirubin: 0.6 mg/dL (ref 0.3–1.2)
Total Protein: 6.8 g/dL (ref 6.5–8.1)

## 2021-06-09 LAB — BASIC METABOLIC PANEL
Anion gap: 5 (ref 5–15)
BUN: 36 mg/dL — ABNORMAL HIGH (ref 8–23)
CO2: 26 mmol/L (ref 22–32)
Calcium: 8.8 mg/dL — ABNORMAL LOW (ref 8.9–10.3)
Chloride: 108 mmol/L (ref 98–111)
Creatinine, Ser: 1.95 mg/dL — ABNORMAL HIGH (ref 0.44–1.00)
GFR, Estimated: 28 mL/min — ABNORMAL LOW (ref >60)
Glucose, Bld: 203 mg/dL — ABNORMAL HIGH (ref 70–99)
Potassium: 4.5 mmol/L (ref 3.5–5.1)
Sodium: 139 mmol/L (ref 135–145)

## 2021-06-09 LAB — CBC
HCT: 39.8 % (ref 36.0–46.0)
Hemoglobin: 12.4 g/dL (ref 12.0–15.0)
MCH: 27.9 pg (ref 26.0–34.0)
MCHC: 31.2 g/dL (ref 30.0–36.0)
MCV: 89.4 fL (ref 80.0–100.0)
Platelets: 178 10*3/uL (ref 150–400)
RBC: 4.45 MIL/uL (ref 3.87–5.11)
RDW: 14.5 % (ref 11.5–15.5)
WBC: 6.7 10*3/uL (ref 4.0–10.5)
nRBC: 0 % (ref 0.0–0.2)

## 2021-06-09 MED ORDER — AMIODARONE HCL 200 MG PO TABS: ORAL_TABLET | ORAL | 0 refills | Status: DC

## 2021-06-09 NOTE — Patient Instructions (Signed)
Increase Amiodarone '200mg'$  twice a day until follow up    Cardioversion scheduled for Friday, June 24th  - Arrive at the [facility] and go to admitting at [facility] not eat or drink anything after midnight the night prior to your procedure.  - Take all your morning medication (except diabetic medications) with a sip of water prior to arrival.  - You will not be able to drive home after your procedure.  - Do NOT miss any doses of your blood thinner - if you should miss a dose please notify our office immediately.  - If you feel as if you go back into normal rhythm prior to scheduled cardioversion, please notify our office immediately. If your procedure is canceled in the cardioversion suite you will be charged a cancellation fee.  Patients will be asked to: to mask in public and hand hygiene (no longer quarantine) in the 3 days prior to surgery, to report if any COVID-19-like illness or household contacts to COVID-19 to determine need for testing

## 2021-06-09 NOTE — H&P (View-Only) (Signed)
Primary Care Physician: Doree Albee, MD Referring Physician: Dr. Serita Kyle Isabella Hall is a 65 y.o. female with a h/o  Afib, s/p ablation in 2019, CHF, HTN, Mod MR, NICM, DMT2 that is in the afib clinic for f/u recent cardioversion. She  saw Dr. [name]   in the [facility]  clinic 11/5 for typical atrial flutter and she had a successful cardioversion. Ekg now shows atypical atrial flutter vrs coarse afib. She felt improved after CV for around 3 days and then started feeling tired  again. Her v rate is 120 bpm today. Her cardizem was reduced from her usual dose of 240 mg bid to 240 mg daily at time of CV. She had lost down to 260 lb, thru diet and walking several days a week, but got "lazy",  stopped walking, diet slipped  and her weight has now  increased to over 300 lbs again.   F/u in afib clinic for tikosyn admit, 11/30/20. No missed xarelto doses x at least 3 weeks, no benadryl use. Plans to get drug thru good rx. Qtc today at 463 ms, atrial flutter with variable block at 103 bpm. In rhythm, I have seen qt at 440 ms .  F/u tikosyn admission,12/08/20.  Unfortunately,  her qr prolonged so tikosyn was stopped. She also had a nosebleed in the hospital that was difficult to control but finally resolved with no further issues. She  is in SR today but d/c instructions say to start amio 200 mg bid today. Her qtc is 418 ms today. She feels better and has gone back to walking and has lost 7 lbs.   Pt is here for f/u, 12/14/20, after being on amiodarone 200 mg bid, started last week. She failed Tikosyn admit for prolonged qt. EKG shows sinus brady at 59 bpm, qt stable at 451 ms and has lost several more pounds as she now feels like walking for exercise. She is tolerating amio well.   F/u in afib clinic, 06/09/21. She returned to afib last Friday. No known trigger but I notice her weight is up 25 lbs from  last visit, but fgluid weight is stable. No missed anticoagulation.   Today, she denies symptoms of  palpitations, chest pain, shortness of breath, orthopnea, PND, lower extremity edema, dizziness, presyncope, syncope, or neurologic sequela. The patient is tolerating medications without difficulties and is otherwise without complaint today.   Past Medical History:  Diagnosis Date   Atrial fibrillation ([place])     CHADSVASC score 4   Essential hypertension    Hypothyroidism    Obesity    Type 2 diabetes, diet controlled ([place])    Typical atrial flutter ([place]) 09/2020   Past Surgical History:  Procedure Laterality Date   APPENDECTOMY  1979   ATRIAL FIBRILLATION ABLATION  03/26/2018   ATRIAL FIBRILLATION ABLATION N/A 03/26/2018   Procedure: ATRIAL FIBRILLATION ABLATION;  Surgeon: Thompson Grayer, MD;  Location: [facility];  Service: Cardiovascular;  Laterality: N/A;   CARDIOVERSION N/A 01/25/2015   Procedure: CARDIOVERSION;  Surgeon: Satira Sark, MD;  Location: [facility] ORS;  Service: Cardiovascular;  Laterality: N/A;   CARDIOVERSION N/A 10/28/2020   Procedure: CARDIOVERSION;  Surgeon: Satira Sark, MD;  Location: [facility] ENDO SU[facility];  Service: Cardiovascular;  Laterality: N/A;   [place] WITH CORONARY ANGIOGRAM N/A 01/27/2015   Procedure: LEFT HEART CATHETERIZATION WITH CORONARY ANGIOGRAM;  Surgeon: Sinclair Grooms, MD;  Location: [facility] CATH  LAB;  Service: Cardiovascular;  Laterality: N/A;   [place] WITHOUT CARDIOVERSION N/A 01/25/2015   Procedure: TRANSESOPHAGEAL ECHOCARDIOGRAM (TEE);  Surgeon: Samuel G McDowell, MD;  Location: [facility] ORS;  Service: Cardiovascular;  Laterality: N/A;   TUBAL LIGATION  1980    Current Outpatient Medications  Medication Sig Dispense Refill   amiodarone (PACERONE) 200 MG tablet Take 1 tablet (200 mg total) by mouth daily. Appointment Required For Further Refills [phone] 30 tablet 0   blood glucose meter kit and supplies KIT Dispense based on patient and insurance preference. Use up to four times daily as directed.  (FOR ICD-9 250.00, 250.01). 1 each 0   Capsicum, Cayenne, (CAYENNE PO) Take 1 Scoop by mouth daily.      carvedilol (COREG) 25 MG tablet Take 0.5 tablets (12.5 mg total) by mouth 2 (two) times daily with a meal. 45 tablet 3   cholecalciferol (VITAMIN D3) 25 MCG (1000 UNIT) tablet Take 1,000 Units by mouth daily.     CINNAMON PO Take 1 capsule by mouth daily.     diltiazem (CARDIZEM CD) 240 MG 24 hr capsule Take 1 capsule (240 mg total) by mouth daily.     GARLIC PO Take 1 tablet by mouth daily.     Ginger, Zingiber officinalis, (GINGER EXTRACT PO) Take 1 tablet by mouth daily.      glucose blood (ACCU-CHEK AVIVA PLUS) test strip Check your blood glucose daily 100 each 12   levothyroxine (SYNTHROID) 150 MCG tablet Take 1 tablet (150 mcg total) by mouth daily. 90 tablet 0   losartan (COZAAR) 25 MG tablet Take 1 tablet (25 mg total) by mouth daily. 90 tablet 2   Multiple Vitamin (MULTIVITAMIN WITH MINERALS) TABS tablet Take 1 tablet by mouth daily.      Multiple Vitamins-Minerals (EMERGEN-C IMMUNE PO) Take 1 tablet by mouth daily.      oxymetazoline (AFRIN) 0.05 % nasal spray Place 1 spray into both nostrils as needed for congestion. For Nosebleeds, limit use. 30 mL 0   XARELTO 20 MG TABS tablet TAKE 1 TABLET BY MOUTH ONCE DAILY WITH SUPPER 30 tablet 6   No current facility-administered medications for this encounter.    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   Social Determinants of Health   Financial Resource Strain: Not on file  Food Insecurity: Not on file  Transportation Needs: Not on file  Physical Activity: Not on file  Stress: Not on file  Social Connections: Not on file  Intimate Partner Violence: Not on file    Family History  Problem Relation Age of Onset   Cancer Mother    Heart disease Father    Hypertension Other    Coronary artery disease Sister          died of an MI in her 59's   Cancer Maternal Grandmother    Atrial fibrillation Neg Hx    Diabetes Neg Hx     ROS- All systems are reviewed and negative except as per the HPI above  Physical Exam: Vitals:   06/09/21 0832  BP: (!) 146/98  Pulse: (!) 123  Weight: (!) 150.2 kg  Height: 5' 6" (1.676 m)   Wt Readings from Last 3 Encounters:  06/09/21 (!) 150.2 kg  12/24/20 (!) 138.9 kg  12/14/20 (!) 137.3 kg    Labs: Lab Results  Component Value Date   NA 139 12/03/2020   K 4.0 12/03/2020   CL 104 12/03/2020   CO2 23 12/03/2020   GLUCOSE 121 (H) 12/03/2020   BUN 34 (H) 12/03/2020   CREATININE 1.55 (H) 12/03/2020   CALCIUM 9.3 12/03/2020   MG 2.3 12/03/2020   Lab Results  Component Value Date   INR 1.6 (H) 10/26/2020   Lab Results  Component Value Date   CHOL 185 09/27/2020   HDL 50 09/27/2020   LDLCALC 112 (H) 09/27/2020   TRIG 119 09/27/2020     GEN- The patient is well appearing, alert and oriented x 3 today.   Head- normocephalic, atraumatic Eyes-  Sclera clear, conjunctiva pink Ears- hearing intact Oropharynx- clear Neck- supple, no JVP Lymph- no cervical lymphadenopathy Lungs- Clear to ausculation bilaterally, normal work of breathing Heart- irregular rate and rhythm, no murmurs, rubs or gallops, PMI not laterally displaced GI- soft, NT, ND, + BS Extremities- no clubbing, cyanosis, or edema MS- no significant deformity  or atrophy Skin- no rash or lesion Psych- euthymic mood, full affect Neuro- strength and sensation are intact  EKG-atrial flutter with 2:1 AV conduction   Echo- 12/01/20-1. Left ventricular ejection fraction, by estimation, is 45 to 50%. The left ventricle has mildly decreased function. The left ventricle demonstrates global hypokinesis. The left ventricular internal cavity size was moderately dilated. Left ventricular diastolic parameters were normal. 2. Right ventricular systolic function is normal. The right ventricular size is normal. There is normal pulmonary artery systolic pressure. The estimated right ventricular systolic pressure is 8.8 mmHg. 3. The mitral valve is normal in structure. Mild to moderate mitral valve regurgitation. No evidence of mitral stenosis. 4. The aortic valve is normal in structure. Aortic valve regurgitation is not visualized. No aortic stenosis is present. 5. The inferior vena cava is normal in size with greater than 50% respiratory variability, suggesting right atrial pressure of 3 mmHg.  Assessment and Plan: 1. H/o of afib, typical and atypical  atrial flutter  Went back into atrial flutter Friday  Failed tikoyn with prolonged qt  Will increase amiodarone to 200 mg bid and set up for cardioversion  Continue on  Carvedilol 12.5 mg bid, CCB 240 mg  1x a day Cbc/cmet   2. CHA2DS2VASc score of 4 Continue xarelto 20 mg daily  States no missed doses for the last 3 weeks   3. BMI of 53.46 Weight loss encouraged  She has recently gained 25 lbs since last visit with Dr. [name] in January    4. HTN Stable   5. Nonischemic CM EF mildly reduced to 45-50% on last echo, thought to have been [place] Being back in SR should help to normalize   F/u with Dr. [name]  in August afib clinic one week after cardioversion   [place]. Arlie Riker, [facility] [address] [place], [place] [address] [phone]

## 2021-06-09 NOTE — Progress Notes (Signed)
Primary Care Physician: Doree Albee, MD Referring Physician: Dr. Serita Kyle Isabella Hall is a 65 y.o. female with a h/o  Afib, s/p ablation in 2019, CHF, HTN, Mod MR, NICM, DMT2 that is in the afib clinic for f/u recent cardioversion. She  saw Dr. [name]   in the [facility]  clinic 11/5 for typical atrial flutter and she had a successful cardioversion. Ekg now shows atypical atrial flutter vrs coarse afib. She felt improved after CV for around 3 days and then started feeling tired  again. Her v rate is 120 bpm today. Her cardizem was reduced from her usual dose of 240 mg bid to 240 mg daily at time of CV. She had lost down to 260 lb, thru diet and walking several days a week, but got "lazy",  stopped walking, diet slipped  and her weight has now  increased to over 300 lbs again.   F/u in afib clinic for tikosyn admit, 11/30/20. No missed xarelto doses x at least 3 weeks, no benadryl use. Plans to get drug thru good rx. Qtc today at 463 ms, atrial flutter with variable block at 103 bpm. In rhythm, I have seen qt at 440 ms .  F/u tikosyn admission,12/08/20.  Unfortunately,  her qr prolonged so tikosyn was stopped. She also had a nosebleed in the hospital that was difficult to control but finally resolved with no further issues. She  is in SR today but d/c instructions say to start amio 200 mg bid today. Her qtc is 418 ms today. She feels better and has gone back to walking and has lost 7 lbs.   Pt is here for f/u, 12/14/20, after being on amiodarone 200 mg bid, started last week. She failed Tikosyn admit for prolonged qt. EKG shows sinus brady at 59 bpm, qt stable at 451 ms and has lost several more pounds as she now feels like walking for exercise. She is tolerating amio well.   F/u in afib clinic, 06/09/21. She returned to afib last Friday. No known trigger but I notice her weight is up 25 lbs from  last visit, but fgluid weight is stable. No missed anticoagulation.   Today, she denies symptoms of  palpitations, chest pain, shortness of breath, orthopnea, PND, lower extremity edema, dizziness, presyncope, syncope, or neurologic sequela. The patient is tolerating medications without difficulties and is otherwise without complaint today.   Past Medical History:  Diagnosis Date   Atrial fibrillation ([place])     CHADSVASC score 4   Essential hypertension    Hypothyroidism    Obesity    Type 2 diabetes, diet controlled ([place])    Typical atrial flutter ([place]) 09/2020   Past Surgical History:  Procedure Laterality Date   APPENDECTOMY  1979   ATRIAL FIBRILLATION ABLATION  03/26/2018   ATRIAL FIBRILLATION ABLATION N/A 03/26/2018   Procedure: ATRIAL FIBRILLATION ABLATION;  Surgeon: Thompson Grayer, MD;  Location: [facility];  Service: Cardiovascular;  Laterality: N/A;   CARDIOVERSION N/A 01/25/2015   Procedure: CARDIOVERSION;  Surgeon: Satira Sark, MD;  Location: [facility] ORS;  Service: Cardiovascular;  Laterality: N/A;   CARDIOVERSION N/A 10/28/2020   Procedure: CARDIOVERSION;  Surgeon: Satira Sark, MD;  Location: [facility] SU[facility];  Service: Cardiovascular;  Laterality: N/A;   [place] WITH CORONARY ANGIOGRAM N/A 01/27/2015   Procedure: LEFT HEART CATHETERIZATION WITH CORONARY ANGIOGRAM;  Surgeon: Sinclair Grooms, MD;  Location: [facility] CATH  LAB;  Service: Cardiovascular;  Laterality: N/A;   [place] WITHOUT CARDIOVERSION N/A 01/25/2015   Procedure: TRANSESOPHAGEAL ECHOCARDIOGRAM (TEE);  Surgeon: Satira Sark, MD;  Location: [facility];  Service: Cardiovascular;  Laterality: N/A;   TUBAL LIGATION  1980    Current Outpatient Medications  Medication Sig Dispense Refill   amiodarone (PACERONE) 200 MG tablet Take 1 tablet (200 mg total) by mouth daily. Appointment Required For Further Refills [phone] 30 tablet 0   blood glucose meter kit and supplies KIT Dispense based on patient and insurance preference. Use up to four times daily as directed.  (FOR ICD-9 250.00, 250.01). 1 each 0   Capsicum, Cayenne, (CAYENNE PO) Take 1 Scoop by mouth daily.      carvedilol (COREG) 25 MG tablet Take 0.5 tablets (12.5 mg total) by mouth 2 (two) times daily with a meal. 45 tablet 3   cholecalciferol (VITAMIN D3) 25 MCG (1000 UNIT) tablet Take 1,000 Units by mouth daily.     CINNAMON PO Take 1 capsule by mouth daily.     diltiazem (CARDIZEM CD) 240 MG 24 hr capsule Take 1 capsule (240 mg total) by mouth daily.     GARLIC PO Take 1 tablet by mouth daily.     Ginger, Zingiber officinalis, (GINGER EXTRACT PO) Take 1 tablet by mouth daily.      glucose blood (ACCU-CHEK AVIVA PLUS) test strip Check your blood glucose daily 100 each 12   levothyroxine (SYNTHROID) 150 MCG tablet Take 1 tablet (150 mcg total) by mouth daily. 90 tablet 0   losartan (COZAAR) 25 MG tablet Take 1 tablet (25 mg total) by mouth daily. 90 tablet 2   Multiple Vitamin (MULTIVITAMIN WITH MINERALS) TABS tablet Take 1 tablet by mouth daily.      Multiple Vitamins-Minerals (EMERGEN-C IMMUNE PO) Take 1 tablet by mouth daily.      oxymetazoline (AFRIN) 0.05 % nasal spray Place 1 spray into both nostrils as needed for congestion. For Nosebleeds, limit use. 30 mL 0   XARELTO 20 MG TABS tablet TAKE 1 TABLET BY MOUTH ONCE DAILY WITH SUPPER 30 tablet 6   No current facility-administered medications for this encounter.    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   Social Determinants of Health   Financial Resource Strain: Not on file  Food Insecurity: Not on file  Transportation Needs: Not on file  Physical Activity: Not on file  Stress: Not on file  Social Connections: Not on file  Intimate Partner Violence: Not on file    Family History  Problem Relation Age of Onset   Cancer Mother    Heart disease Father    Hypertension Other    Coronary artery disease Sister  died of an MI in her 59's   Cancer Maternal Grandmother    Atrial fibrillation Neg Hx    Diabetes Neg Hx     ROS- All systems are reviewed and negative except as per the HPI above  Physical Exam: Vitals:   06/09/21 0832  BP: (!) 146/98  Pulse: (!) 123  Weight: (!) 150.2 kg  Height: 5' 6" (1.676 m)   Wt Readings from Last 3 Encounters:  06/09/21 (!) 150.2 kg  12/24/20 (!) 138.9 kg  12/14/20 (!) 137.3 kg    Labs: Lab Results  Component Value Date   NA 139 12/03/2020   K 4.0 12/03/2020   CL 104 12/03/2020   CO2 23 12/03/2020   GLUCOSE 121 (H) 12/03/2020   BUN 34 (H) 12/03/2020   CREATININE 1.55 (H) 12/03/2020   CALCIUM 9.3 12/03/2020   MG 2.3 12/03/2020   Lab Results  Component Value Date   INR 1.6 (H) 10/26/2020   Lab Results  Component Value Date   CHOL 185 09/27/2020   HDL 50 09/27/2020   LDLCALC 112 (H) 09/27/2020   TRIG 119 09/27/2020     GEN- The patient is well appearing, alert and oriented x 3 today.   Head- normocephalic, atraumatic Eyes-  Sclera clear, conjunctiva pink Ears- hearing intact Oropharynx- clear Neck- supple, no JVP Lymph- no cervical lymphadenopathy Lungs- Clear to ausculation bilaterally, normal work of breathing Heart- irregular rate and rhythm, no murmurs, rubs or gallops, PMI not laterally displaced GI- soft, NT, ND, + BS Extremities- no clubbing, cyanosis, or edema MS- no significant deformity  or atrophy Skin- no rash or lesion Psych- euthymic mood, full affect Neuro- strength and sensation are intact  EKG-atrial flutter with 2:1 AV conduction   Echo- 12/01/20-1. Left ventricular ejection fraction, by estimation, is 45 to 50%. The left ventricle has mildly decreased function. The left ventricle demonstrates global hypokinesis. The left ventricular internal cavity size was moderately dilated. Left ventricular diastolic parameters were normal. 2. Right ventricular systolic function is normal. The right ventricular size is normal. There is normal pulmonary artery systolic pressure. The estimated right ventricular systolic pressure is 8.8 mmHg. 3. The mitral valve is normal in structure. Mild to moderate mitral valve regurgitation. No evidence of mitral stenosis. 4. The aortic valve is normal in structure. Aortic valve regurgitation is not visualized. No aortic stenosis is present. 5. The inferior vena cava is normal in size with greater than 50% respiratory variability, suggesting right atrial pressure of 3 mmHg.  Assessment and Plan: 1. H/o of afib, typical and atypical  atrial flutter  Went back into atrial flutter Friday  Failed tikoyn with prolonged qt  Will increase amiodarone to 200 mg bid and set up for cardioversion  Continue on  Carvedilol 12.5 mg bid, CCB 240 mg  1x a day Cbc/cmet   2. CHA2DS2VASc score of 4 Continue xarelto 20 mg daily  States no missed doses for the last 3 weeks   3. BMI of 53.46 Weight loss encouraged  She has recently gained 25 lbs since last visit with Dr. [name] in January    4. HTN Stable   5. Nonischemic CM EF mildly reduced to 45-50% on last echo, thought to have been [place] Being back in SR should help to normalize   F/u with Dr. [name]  in August afib clinic one week after cardioversion   [place]. Bradlee Bridgers, [facility] [address] [place], [place] [address] [phone]

## 2021-06-10 ENCOUNTER — Encounter (HOSPITAL_COMMUNITY): Payer: Self-pay | Admitting: Cardiovascular Disease

## 2021-06-10 ENCOUNTER — Other Ambulatory Visit: Payer: Self-pay

## 2021-06-10 ENCOUNTER — Ambulatory Visit (HOSPITAL_COMMUNITY): Payer: 59 | Admitting: Anesthesiology

## 2021-06-10 ENCOUNTER — Ambulatory Visit (HOSPITAL_COMMUNITY)
Admission: RE | Admit: 2021-06-10 | Discharge: 2021-06-10 | Disposition: A | Discharge status: Home / Self Care | Payer: 59 | Attending: Cardiovascular Disease | Admitting: Cardiovascular Disease

## 2021-06-10 ENCOUNTER — Encounter (HOSPITAL_COMMUNITY)
Admission: RE | Disposition: A | Discharge status: Home / Self Care | Payer: Self-pay | Source: Home / Self Care | Attending: Cardiovascular Disease

## 2021-06-10 DIAGNOSIS — I110 Hypertensive heart disease with heart failure: Secondary | ICD-10-CM | POA: Diagnosis not present

## 2021-06-10 DIAGNOSIS — I483 Typical atrial flutter: Secondary | ICD-10-CM | POA: Diagnosis not present

## 2021-06-10 DIAGNOSIS — I4891 Unspecified atrial fibrillation: Secondary | ICD-10-CM | POA: Insufficient documentation

## 2021-06-10 DIAGNOSIS — E119 Type 2 diabetes mellitus without complications: Secondary | ICD-10-CM | POA: Insufficient documentation

## 2021-06-10 DIAGNOSIS — I428 Other cardiomyopathies: Secondary | ICD-10-CM | POA: Insufficient documentation

## 2021-06-10 DIAGNOSIS — Z79890 Hormone replacement therapy (postmenopausal): Secondary | ICD-10-CM | POA: Diagnosis not present

## 2021-06-10 DIAGNOSIS — I509 Heart failure, unspecified: Secondary | ICD-10-CM | POA: Insufficient documentation

## 2021-06-10 DIAGNOSIS — Z79899 Other long term (current) drug therapy: Secondary | ICD-10-CM | POA: Insufficient documentation

## 2021-06-10 DIAGNOSIS — I484 Atypical atrial flutter: Secondary | ICD-10-CM | POA: Diagnosis not present

## 2021-06-10 DIAGNOSIS — Z7901 Long term (current) use of anticoagulants: Secondary | ICD-10-CM | POA: Insufficient documentation

## 2021-06-10 HISTORY — PX: CARDIOVERSION: SHX1299

## 2021-06-10 SURGERY — CARDIOVERSION
Anesthesia: General

## 2021-06-10 MED ORDER — PROPOFOL 10 MG/ML IV BOLUS
INTRAVENOUS | Status: DC | PRN
  Administered 2021-06-10: 90 mg via INTRAVENOUS

## 2021-06-10 MED ORDER — LIDOCAINE 2% (20 MG/ML) 5 ML SYRINGE
INTRAMUSCULAR | Status: DC | PRN
  Administered 2021-06-10: 40 mg via INTRAVENOUS

## 2021-06-10 MED ORDER — SODIUM CHLORIDE 0.9 % IV SOLN: INTRAVENOUS | Status: DC

## 2021-06-10 NOTE — Discharge Instructions (Signed)

## 2021-06-10 NOTE — CV Procedure (Signed)
Electrical Cardioversion Procedure Note Isabella Hall [id number] 02-Feb-1956  Procedure: Electrical Cardioversion Indications:  Atrial Flutter  Procedure Details Consent: Risks of procedure as well as the alternatives and risks of each were explained to the (patient/caregiver).  Consent for procedure obtained. Time Out: Verified patient identification, verified procedure, site/side was marked, verified correct patient position, special equipment/implants available, medications/allergies/relevent history reviewed, required imaging and test results available.  Performed  Patient placed on cardiac monitor, pulse oximetry, supplemental oxygen as necessary.  Sedation given:  propofol Pacer pads placed anterior and posterior chest.  Cardioverted 1 time(s).  Cardioverted at [place].  Evaluation Findings: Post procedure EKG shows: NSR Complications: None Patient did tolerate procedure well.   Skeet Latch, MD 06/10/2021, 10:27 AM

## 2021-06-10 NOTE — Transfer of Care (Signed)
Immediate Anesthesia Transfer of Care Note  Patient: Isabella Hall  Procedure(s) Performed: CARDIOVERSION  Patient Location: Endoscopy Unit  Anesthesia Type:General  Level of Consciousness: awake, alert , oriented and patient cooperative  Airway & Oxygen Therapy: Patient Spontanous Breathing and Patient connected to nasal cannula oxygen  Post-op Assessment: Report given to RN, Post -op Vital signs reviewed and stable and Patient moving all extremities  Post vital signs: Reviewed and stable  Last Vitals:  Vitals Value Taken Time  BP    Temp    Pulse    Resp    SpO2      Last Pain:  Vitals:   06/10/21 0946  PainSc: 0-No pain         Complications: No notable events documented.

## 2021-06-10 NOTE — Anesthesia Postprocedure Evaluation (Signed)
Anesthesia Post Note  Patient: Isabella Hall  Procedure(s) Performed: CARDIOVERSION     Patient location during evaluation: Endoscopy Anesthesia Type: General Level of consciousness: awake and alert Pain management: pain level controlled Vital Signs Assessment: post-procedure vital signs reviewed and stable Respiratory status: spontaneous breathing, nonlabored ventilation, respiratory function stable and patient connected to nasal cannula oxygen Cardiovascular status: blood pressure returned to baseline and stable Postop Assessment: no apparent nausea or vomiting Anesthetic complications: no   No notable events documented.  Last Vitals:  Vitals:   06/10/21 1040 06/10/21 1045  BP: 110/82 111/60  Pulse: (!) 48 (!) 50  Resp: 14 13  Temp:    SpO2: 99% 99%    Last Pain:  Vitals:   06/10/21 1045  TempSrc:   PainSc: 0-No pain                 Bradden Tadros COKER

## 2021-06-10 NOTE — Anesthesia Preprocedure Evaluation (Addendum)
Anesthesia Evaluation  Patient identified by MRN, date of birth, ID band Patient awake    Reviewed: Allergy & Precautions, NPO status , Patient's Chart, lab work & pertinent test results  History of Anesthesia Complications Negative for: history of anesthetic complications  Airway Mallampati: II  TM Distance: >3 FB Neck ROM: Full    Dental  (+) Teeth Intact   Pulmonary neg pulmonary ROS,    breath sounds clear to auscultation       Cardiovascular hypertension, Pt. on medications and Pt. on home beta blockers +CHF  + dysrhythmias Atrial Fibrillation + Valvular Problems/Murmurs MR  Rhythm:Irregular Rate:Normal   '21 TTE - EF 45 to 50%.  The left ventricle demonstrates global hypokinesis. The left ventricular internal cavity size  was moderately dilated. Mild to moderate mitral valve regurgitation.     Neuro/Psych negative neurological ROS  negative psych ROS   GI/Hepatic negative GI ROS, Neg liver ROS,   Endo/Other  diabetes, Type 2Hypothyroidism Morbid obesity  Renal/GU CRFRenal disease     Musculoskeletal negative musculoskeletal ROS (+)   Abdominal (+) + obese,   Peds  Hematology negative hematology ROS (+)   Anesthesia Other Findings   Reproductive/Obstetrics                            Anesthesia Physical Anesthesia Plan  ASA: 3  Anesthesia Plan: General   Post-op Pain Management:    Induction: Intravenous  PONV Risk Score and Plan: 3 and Treatment may vary due to age or medical condition and Propofol infusion  Airway Management Planned: Mask and Natural Airway  Additional Equipment: None  Intra-op Plan:   Post-operative Plan:   Informed Consent: I have reviewed the patients History and Physical, chart, labs and discussed the procedure including the risks, benefits and alternatives for the proposed anesthesia with the patient or authorized representative who has indicated  his/her understanding and acceptance.       Plan Discussed with: CRNA and Anesthesiologist  Anesthesia Plan Comments:        Anesthesia Quick Evaluation

## 2021-06-13 ENCOUNTER — Ambulatory Visit (INDEPENDENT_AMBULATORY_CARE_PROVIDER_SITE_OTHER): Payer: 59 | Admitting: Internal Medicine

## 2021-06-13 ENCOUNTER — Encounter (HOSPITAL_COMMUNITY): Payer: Self-pay | Admitting: Cardiovascular Disease

## 2021-06-14 NOTE — Interval H&P Note (Signed)
History and Physical Interval Note:  06/14/2021 3:04 PM  Isabella Hall  has presented today for surgery, with the diagnosis of AFIB.  The various methods of treatment have been discussed with the patient and family. After consideration of risks, benefits and other options for treatment, the patient has consented to  Procedure(s): CARDIOVERSION (N/A) as a surgical intervention.  The patient's history has been reviewed, patient examined, no change in status, stable for surgery.  I have reviewed the patient's chart and labs.  Questions were answered to the patient's satisfaction.     Skeet Latch, MD

## 2021-06-15 ENCOUNTER — Ambulatory Visit (HOSPITAL_COMMUNITY): Payer: 59 | Admitting: Nurse Practitioner

## 2021-06-23 ENCOUNTER — Encounter (HOSPITAL_COMMUNITY): Payer: Self-pay | Admitting: Nurse Practitioner

## 2021-06-29 ENCOUNTER — Other Ambulatory Visit: Payer: Self-pay | Admitting: Family Medicine

## 2021-06-29 ENCOUNTER — Ambulatory Visit (HOSPITAL_COMMUNITY): Payer: 59 | Admitting: Nurse Practitioner

## 2021-07-11 ENCOUNTER — Ambulatory Visit (INDEPENDENT_AMBULATORY_CARE_PROVIDER_SITE_OTHER): Payer: 59 | Admitting: Internal Medicine

## 2021-07-20 ENCOUNTER — Other Ambulatory Visit (HOSPITAL_COMMUNITY): Payer: Self-pay | Admitting: Nurse Practitioner

## 2021-07-21 ENCOUNTER — Telehealth (INDEPENDENT_AMBULATORY_CARE_PROVIDER_SITE_OTHER): Payer: Self-pay

## 2021-07-21 ENCOUNTER — Other Ambulatory Visit: Payer: Self-pay

## 2021-07-21 DIAGNOSIS — E039 Hypothyroidism, unspecified: Secondary | ICD-10-CM

## 2021-07-21 MED ORDER — LEVOTHYROXINE SODIUM 150 MCG PO TABS: 150.0000 ug | ORAL_TABLET | Freq: Every day | ORAL | 2 refills | Status: DC

## 2021-07-21 MED ORDER — AMIODARONE HCL 200 MG PO TABS: 200.0000 mg | ORAL_TABLET | Freq: Every day | ORAL | 0 refills | Status: DC

## 2021-07-21 NOTE — Telephone Encounter (Signed)
Refill approved and sent to pharmacy.  Per chart review its been quite sometime since her thyroid levels in her blood have been checked and at that point they were abnormal.  TSH was a bit high which indicates that maybe she was not getting enough thyroid hormone at that time.  Thus, please explain to her that the practice is closing at the end of the month due to Dr. Anastasio Champion passing away.  Please explain to her that she needs to be established with a primary care provider as soon as possible so they can monitor her thyroid levels and make sure she is on the correct dose.  Thank you.

## 2021-07-21 NOTE — Telephone Encounter (Signed)
Patient called and stated that she needs a refill of the following medication:  levothyroxine (SYNTHROID) 150 MCG tablet Last filled 05/30/2021, # 90 with 0 refills

## 2021-07-21 NOTE — Telephone Encounter (Signed)
Called patient and gave her the message. Also let patient know about our practice and gave patient numbers to call to be established at a different PCP office. Patient verbalized an understanding and thanked [place].

## 2021-07-22 ENCOUNTER — Other Ambulatory Visit: Payer: Self-pay

## 2021-07-22 ENCOUNTER — Ambulatory Visit (INDEPENDENT_AMBULATORY_CARE_PROVIDER_SITE_OTHER): Payer: 59 | Admitting: Internal Medicine

## 2021-07-22 VITALS — BP 160/98 | HR 53 | Ht 66.0 in | Wt 337.0 lb

## 2021-07-22 DIAGNOSIS — I483 Typical atrial flutter: Secondary | ICD-10-CM

## 2021-07-22 DIAGNOSIS — D6869 Other thrombophilia: Secondary | ICD-10-CM | POA: Diagnosis not present

## 2021-07-22 DIAGNOSIS — I4819 Other persistent atrial fibrillation: Secondary | ICD-10-CM

## 2021-07-22 MED ORDER — LOSARTAN POTASSIUM 50 MG PO TABS: 50.0000 mg | ORAL_TABLET | Freq: Every day | ORAL | 6 refills | Status: DC

## 2021-07-22 NOTE — Patient Instructions (Addendum)
Medication Instructions:  Increase Cozaar to '50mg'$  daily.  Continue all other medications.    Labwork: none  Testing/Procedures: none  Follow-Up: 4 months   Any Other Special Instructions Will Be Listed Below (If Applicable).  If you need a refill on your cardiac medications before your next appointment, please call your pharmacy.

## 2021-07-22 NOTE — Progress Notes (Signed)
PCP: Doree Albee, MD Primary Cardiologist: previously Dr [name] Primary EP: Dr Serita Kyle Isabella Hall is a 65 y.o. female who presents today for routine electrophysiology followup.  Since last being seen in our clinic, the patient reports doing very well.  Today, she denies symptoms of palpitations, chest pain, shortness of breath,  lower extremity edema, dizziness, presyncope, or syncope.  The patient is otherwise without complaint today.   Past Medical History:  Diagnosis Date   Atrial fibrillation ([place])     CHADSVASC score 4   Essential hypertension    Hypothyroidism    Obesity    Type 2 diabetes, diet controlled ([place])    Typical atrial flutter ([place]) 09/2020   Past Surgical History:  Procedure Laterality Date   APPENDECTOMY  1979   ATRIAL FIBRILLATION ABLATION  03/26/2018   ATRIAL FIBRILLATION ABLATION N/A 03/26/2018   Procedure: ATRIAL FIBRILLATION ABLATION;  Surgeon: Thompson Grayer, MD;  Location: [place] [facility];  Service: Cardiovascular;  Laterality: N/A;   CARDIOVERSION N/A 01/25/2015   Procedure: CARDIOVERSION;  Surgeon: Satira Sark, MD;  Location: [facility];  Service: Cardiovascular;  Laterality: N/A;   CARDIOVERSION N/A 10/28/2020   Procedure: CARDIOVERSION;  Surgeon: Satira Sark, MD;  [facility];  Service: Cardiovascular;  Laterality: N/A;   CARDIOVERSION N/A 06/10/2021   Procedure: CARDIOVERSION;  Surgeon: Skeet Latch, MD;  Location: [place];  Service: Cardiovascular;  Laterality: N/A;   [place] ANGIOGRAM N/A 01/27/2015   Procedure: LEFT HEART CATHETERIZATION WITH CORONARY ANGIOGRAM;  Surgeon: Sinclair Grooms, MD;  Location: [facility];  Service: Cardiovascular;  Laterality: N/A;   TEE WITHOUT CARDIOVERSION N/A 01/25/2015   Procedure: TRANSESOPHAGEAL ECHOCARDIOGRAM (TEE);  Surgeon: Satira Sark, MD;  Location: [facility];  Service: Cardiovascular;  Laterality: N/A;    [place] are reviewed and negatives except as per HPI above  Current Outpatient Medications  Medication Sig Dispense Refill   amiodarone (PACERONE) 200 MG tablet Take 1 tablet (200 mg total) by mouth daily. 45 tablet 0   blood glucose meter kit and supplies KIT Dispense based on patient and insurance preference. Use up to four times daily as directed. (FOR ICD-9 250.00, 250.01). 1 each 0   Capsicum, Cayenne, (CAYENNE PO) Take 1 Scoop by mouth daily.      carvedilol (COREG) 25 MG tablet Take 0.5 tablets (12.5 mg total) by mouth 2 (two) times daily with a meal. 45 tablet 3   cholecalciferol (VITAMIN D3) 25 MCG (1000 UNIT) tablet Take 1,000 Units by mouth daily.     CINNAMON PO Take 1 capsule by mouth daily.     diltiazem (CARDIZEM CD) 240 MG 24 hr capsule Take 1 capsule by mouth once daily 90 capsule 0   GARLIC PO Take 1 tablet by mouth daily.     Ginger, Zingiber officinalis, (GINGER EXTRACT PO) Take 1 tablet by mouth daily.      glucose blood (ACCU-CHEK AVIVA PLUS) test strip Check your blood glucose daily 100 each 12   levothyroxine (SYNTHROID) 150 MCG tablet Take 1 tablet (150 mcg total) by mouth daily. 30 tablet 2   losartan (COZAAR) 25 MG tablet Take 1 tablet (25 mg total) by mouth daily. 90 tablet 2   Multiple Vitamin (MULTIVITAMIN WITH MINERALS) TABS tablet Take 1 tablet by mouth daily.      Multiple Vitamins-Minerals (EMERGEN-C IMMUNE PO)  Take 1 tablet by mouth daily.      oxymetazoline (AFRIN) 0.05 % nasal spray Place 1 spray into both nostrils as needed for congestion. For Nosebleeds, limit use. 30 mL 0   XARELTO 20 MG TABS tablet TAKE 1 TABLET BY MOUTH ONCE DAILY WITH SUPPER 30 tablet 6   No current facility-administered medications for this visit.    Physical Exam: Vitals:   07/22/21 1228  BP: (!) 160/98  Pulse: (!) 53  SpO2: 98%  Weight: (!) 337 lb (152.9 kg)  Height: 5' 6" (1.676 m)    GEN- The patient is well appearing, alert and  oriented x 3 today.   Head- normocephalic, atraumatic Eyes-  Sclera clear, conjunctiva pink Ears- hearing intact Oropharynx- clear Lungs- Clear to ausculation bilaterally, normal work of breathing Heart- Regular rate and rhythm, no murmurs, rubs or gallops, PMI not laterally displaced GI- soft, NT, ND, + BS Extremities- no clubbing, cyanosis, or edema  Wt Readings from Last 3 Encounters:  07/22/21 (!) 337 lb (152.9 kg)  06/10/21 (!) 331 lb 3.2 oz (150.2 kg)  06/09/21 (!) 331 lb 3.2 oz (150.2 kg)    EKG tracing ordered today is personally reviewed and shows sinus bradycardia  Assessment and Plan:  Persistent afib/ atrial flutter S/p ablation for afib Presented 6/22 with typical appearing atrial flutter continue amiodarone for now We discussed repeat ablation strategies.  She would prefer to continue medicine currently but will reconsider ablation going forward. Labs 06/09/2021 reviewed Required cardioversion in June The importance of lifestyle modification was discussed today  2. Morbid obesity She continues to have trouble with maintaining her weight long term.  She has worked very hard on this.  3. HTN increase cozaar to 58m daily Bmet 06/09/21 reviewed  4. Nonischemic CM Likely tachycardia mediated EF 45-50% Increase cozaar as above  Risks, benefits and potential toxicities for medications prescribed and/or refilled reviewed with patient today.   Return to see me in 4 months  JThompson GrayerMD, FParkland Medical Center8/04/2021 12:36 PM

## 2021-08-04 ENCOUNTER — Ambulatory Visit (INDEPENDENT_AMBULATORY_CARE_PROVIDER_SITE_OTHER): Payer: 59 | Admitting: Internal Medicine

## 2021-09-18 ENCOUNTER — Other Ambulatory Visit: Payer: Self-pay | Admitting: Nurse Practitioner

## 2021-10-12 ENCOUNTER — Other Ambulatory Visit: Payer: Self-pay | Admitting: Internal Medicine

## 2021-10-12 NOTE — Telephone Encounter (Signed)
Xarelto 20mg  paper refill request received. Pt is 65 years old, weight-152.9kg, Crea-1.95 on 06/09/2021, last seen by Dr. [name] on 07/22/2021, Diagnosis-Afib, CrCl-71.48ml/min; Dose is appropriate based on dosing criteria. Will send in refill to requested pharmacy.

## 2021-11-25 ENCOUNTER — Ambulatory Visit (INDEPENDENT_AMBULATORY_CARE_PROVIDER_SITE_OTHER): Payer: 59 | Admitting: Internal Medicine

## 2021-11-25 ENCOUNTER — Encounter: Payer: Self-pay | Admitting: Internal Medicine

## 2021-11-25 VITALS — BP 136/90 | HR 64 | Ht 66.0 in | Wt 347.0 lb

## 2021-11-25 DIAGNOSIS — I483 Typical atrial flutter: Secondary | ICD-10-CM

## 2021-11-25 DIAGNOSIS — I10 Essential (primary) hypertension: Secondary | ICD-10-CM

## 2021-11-25 DIAGNOSIS — I4819 Other persistent atrial fibrillation: Secondary | ICD-10-CM

## 2021-11-25 DIAGNOSIS — D6869 Other thrombophilia: Secondary | ICD-10-CM | POA: Diagnosis not present

## 2021-11-25 DIAGNOSIS — Z79899 Other long term (current) drug therapy: Secondary | ICD-10-CM

## 2021-11-25 MED ORDER — AMIODARONE HCL 200 MG PO TABS: 100.0000 mg | ORAL_TABLET | Freq: Every day | ORAL | Status: DC

## 2021-11-25 NOTE — Addendum Note (Signed)
Addended by: Laurine Blazer on: 11/25/2021 11:46 AM   Modules accepted: Orders

## 2021-11-25 NOTE — Progress Notes (Signed)
PCP: Doree Albee, MD (Inactive)   Primary EP: Dr Serita Kyle Isabella Hall is a 65 y.o. female who presents today for routine electrophysiology followup.  Since last being seen in our clinic, the patient reports doing very well.  Today, she denies symptoms of palpitations, chest pain, shortness of breath,  lower extremity edema, dizziness, presyncope, or syncope.  The patient is otherwise without complaint today.   Past Medical History:  Diagnosis Date   Atrial fibrillation ([place])     CHADSVASC score 4   Essential hypertension    Hypothyroidism    Obesity    Type 2 diabetes, diet controlled ([place])    Typical atrial flutter ([place]) 09/2020   Past Surgical History:  Procedure Laterality Date   APPENDECTOMY  1979   ATRIAL FIBRILLATION ABLATION  03/26/2018   ATRIAL FIBRILLATION ABLATION N/A 03/26/2018   Procedure: ATRIAL FIBRILLATION ABLATION;  Surgeon: Thompson Grayer, MD;  Location: [place] [facility];  Service: Cardiovascular;  Laterality: N/A;   CARDIOVERSION N/A 01/25/2015   Procedure: CARDIOVERSION;  Surgeon: Satira Sark, MD;  Location[facility];  Service: Cardiovascular;  Laterality: N/A;   CARDIOVERSION N/A 10/28/2020   Procedure: CARDIOVERSION;  Surgeon: Satira Sark, MD;  [facility];  Service: Cardiovascular;  Laterality: N/A;   CARDIOVERSION N/A 06/10/2021   Procedure: CARDIOVERSION;  Surgeon: Skeet Latch, MD;  Location: [place];  Service: Cardiovascular;  Laterality: N/A;   [place] ANGIOGRAM N/A 01/27/2015   Procedure: LEFT HEART CATHETERIZATION WITH CORONARY ANGIOGRAM;  Surgeon: Sinclair Grooms, MD;  Location: [facility] CATH LAB;  Service: Cardiovascular;  Laterality: N/A;   TEE WITHOUT CARDIOVERSION N/A 01/25/2015   Procedure: TRANSESOPHAGEAL ECHOCARDIOGRAM (TEE);  Surgeon: Satira Sark, MD;  Location: [facility];  Service: Cardiovascular;  Laterality: N/A;   [place] are reviewed and negatives except as per HPI above  Current Outpatient Medications  Medication Sig Dispense Refill   amiodarone (PACERONE) 200 MG tablet Take 1 tablet (200 mg total) by mouth daily. 45 tablet 0   blood glucose meter kit and supplies KIT Dispense based on patient and insurance preference. Use up to four times daily as directed. (FOR ICD-9 250.00, 250.01). 1 each 0   Capsicum, Cayenne, (CAYENNE PO) Take 1 Scoop by mouth daily.      carvedilol (COREG) 25 MG tablet Take 0.5 tablets (12.5 mg total) by mouth 2 (two) times daily with a meal. 45 tablet 3   cholecalciferol (VITAMIN D3) 25 MCG (1000 UNIT) tablet Take 1,000 Units by mouth daily.     CINNAMON PO Take 1 capsule by mouth daily.     diltiazem (CARDIZEM CD) 240 MG 24 hr capsule Take 1 capsule by mouth once daily 90 capsule 0   GARLIC PO Take 1 tablet by mouth daily.     Ginger, Zingiber officinalis, (GINGER EXTRACT PO) Take 1 tablet by mouth daily.      glucose blood (ACCU-CHEK AVIVA PLUS) test strip Check your blood glucose daily 100 each 12   levothyroxine (SYNTHROID) 150 MCG tablet Take 1 tablet (150 mcg total) by mouth daily. 30 tablet 2   losartan (COZAAR) 50 MG tablet Take 1 tablet (50 mg total) by mouth daily. 30 tablet 6   Multiple Vitamin (MULTIVITAMIN WITH MINERALS) TABS tablet Take 1 tablet by mouth daily.      Multiple Vitamins-Minerals (EMERGEN-C IMMUNE PO) Take 1  tablet by mouth daily.      oxymetazoline (AFRIN) 0.05 % nasal spray Place 1 spray into both nostrils as needed for congestion. For Nosebleeds, limit use. 30 mL 0   rivaroxaban (XARELTO) 20 MG TABS tablet TAKE 1 TABLET BY MOUTH ONCE DAILY WITH SUPPER 30 tablet 5   No current facility-administered medications for this visit.    Physical Exam: Vitals:   11/25/21 1109  Weight: (!) 347 lb (157.4 kg)  Height: _0  (1.676 m)    GEN- The patient is well appearing, alert and oriented x 3 today.   Head- normocephalic, atraumatic Eyes-   Sclera clear, conjunctiva pink Ears- hearing intact Oropharynx- clear Lungs- Clear to ausculation bilaterally, normal work of breathing Heart- Regular rate and rhythm, no murmurs, rubs or gallops, PMI not laterally displaced GI- soft, NT, ND, + BS Extremities- no clubbing, cyanosis, or edema  Wt Readings from Last 3 Encounters:  11/25/21 (!) 347 lb (157.4 kg)  07/22/21 (!) 337 lb (152.9 kg)  06/10/21 (!) 331 lb 3.2 oz (150.2 kg)    EKG tracing ordered today is personally reviewed and shows sinus  Assessment and Plan:  Persistent atrial fibrillation/ atrial flutter Doing well s/p ablation She developed typical appearing atrial flutter post ablation. She has maintained sinus with amiodarone therapy.  She wishes to avoid repeat ablation currently Labs 06/09/21 reviewed Repeat lfts, tfts at this time Reduce amiodarone to 119m daily  2. Morbid obesity Body mass index is 56.01 kg/m. Lifestyle modification again advised  3. HTN Stable No change required today  4. Nonischemic CM Likely tachycardia mediated EF 45-50%  Risks, benefits and potential toxicities for medications prescribed and/or refilled reviewed with patient today.   Follow-up in AF clinic in 6 months  JThompson GrayerMD, FAscension [facility]12/08/2021 11:15 AM

## 2021-11-25 NOTE — Patient Instructions (Signed)
Medication Instructions:  Decrease Amiodarone to 100mg  daily  Continue all other medications.     Labwork: TSH, CMET, CBC - orders given today Office will contact with results via phone or letter.     Testing/Procedures: none  Follow-Up: 6 months - Afib Clinic  Any Other Special Instructions Will Be Listed Below (If Applicable).   If you need a refill on your cardiac medications before your next appointment, please call your pharmacy.

## 2021-12-03 ENCOUNTER — Other Ambulatory Visit: Payer: Self-pay | Admitting: Internal Medicine

## 2021-12-05 NOTE — Telephone Encounter (Signed)
This is a A-Fib clinic pt 

## 2021-12-18 ENCOUNTER — Other Ambulatory Visit: Payer: Self-pay | Admitting: Internal Medicine

## 2021-12-18 ENCOUNTER — Other Ambulatory Visit (HOSPITAL_COMMUNITY): Payer: Self-pay | Admitting: Nurse Practitioner

## 2022-01-24 ENCOUNTER — Ambulatory Visit: Payer: 59 | Admitting: Nurse Practitioner

## 2022-01-26 ENCOUNTER — Other Ambulatory Visit (INDEPENDENT_AMBULATORY_CARE_PROVIDER_SITE_OTHER): Payer: Self-pay | Admitting: Nurse Practitioner

## 2022-01-26 DIAGNOSIS — E039 Hypothyroidism, unspecified: Secondary | ICD-10-CM

## 2022-02-17 ENCOUNTER — Encounter: Payer: Self-pay | Admitting: Nurse Practitioner

## 2022-02-17 ENCOUNTER — Other Ambulatory Visit: Payer: Self-pay

## 2022-02-17 ENCOUNTER — Ambulatory Visit: Payer: 59 | Admitting: Nurse Practitioner

## 2022-02-17 VITALS — BP 138/84 | HR 76 | Ht 66.0 in | Wt 350.0 lb

## 2022-02-17 DIAGNOSIS — I10 Essential (primary) hypertension: Secondary | ICD-10-CM

## 2022-02-17 DIAGNOSIS — I4819 Other persistent atrial fibrillation: Secondary | ICD-10-CM

## 2022-02-17 DIAGNOSIS — E039 Hypothyroidism, unspecified: Secondary | ICD-10-CM

## 2022-02-17 DIAGNOSIS — N1832 Chronic kidney disease, stage 3b: Secondary | ICD-10-CM | POA: Diagnosis not present

## 2022-02-17 DIAGNOSIS — E1165 Type 2 diabetes mellitus with hyperglycemia: Secondary | ICD-10-CM

## 2022-02-17 DIAGNOSIS — Z1231 Encounter for screening mammogram for malignant neoplasm of breast: Secondary | ICD-10-CM

## 2022-02-17 DIAGNOSIS — Z1211 Encounter for screening for malignant neoplasm of colon: Secondary | ICD-10-CM

## 2022-02-17 MED ORDER — LEVOTHYROXINE SODIUM 150 MCG PO TABS: 150.0000 ug | ORAL_TABLET | Freq: Every day | ORAL | 2 refills | Status: DC

## 2022-02-17 NOTE — Progress Notes (Signed)
New Patient Office Visit  Subjective:  Patient ID: Isabella Hall, female    DOB: 1956-01-13  Age: 66 y.o. MRN: [id number]  CC:  Chief Complaint  Patient presents with   New Patient (Initial Visit)    NP   Afib. Has had ablation , takes amiodarone , follwed by Dr Joylene Grapes.     HPI Isabella Hall with past medical history of benign essential hypertension, hypothyroidism, type 2 diabetes, stage IIIb chronic kidney disease, morbid obesity, persistent A-fib presents to establish care for her chronic medical conditions.  Previous patient of Dr. Anastasio Champion.  Persistent A-fib.  Has had ablation done.  Takes amiodarone 100 mg daily, carvedilol 25 mg twice daily, diltiazem 240 mg once daily, Xarelto 20 mg once daily.  Followed by Dr. [name].  Patient denies palpitation chest pain bloody stool.  Type 2 diabetes, currently not on medication patient states that her diabetes has been diet controlled.  She is not on a statin, take losartan 50 mg daily.  Patient denies polyphagia polyuria.  Hypothyroidism.  Takes Synthroid 150 mcg daily patient denies constipation, depression, slow heart rate.  Hypertension.  Takes losartan 50 mg daily, carvedilol 25 mg twice daily, diltiazem to 40 mg once daily, amiodarone 100 mg daily.  Patient denies chest pain shortness of breath palpitation.  Patient is due for shingles vaccine, TDAP vaccine, COVID booster, flu vaccine.  Need for all vaccine discussed with patient she verbalized understanding.   Patient is due for PAP smear,  mammogram,  colonoscopy.  Colonoscopy and mammogram ordered today  Past Medical History:  Diagnosis Date   Atrial fibrillation ([place])     CHADSVASC score 4   Essential hypertension    Hypothyroidism    Obesity    Type 2 diabetes, diet controlled ([place])    Typical atrial flutter ([place]) 09/2020    Past Surgical History:  Procedure Laterality Date   APPENDECTOMY  1979   ATRIAL FIBRILLATION ABLATION  03/26/2018   ATRIAL FIBRILLATION  ABLATION N/A 03/26/2018   Procedure: ATRIAL FIBRILLATION ABLATION;  Surgeon: Thompson Grayer, MD;  Location: [facility];  Service: Cardiovascular;  Laterality: N/A;   CARDIOVERSION N/A 01/25/2015   Procedure: CARDIOVERSION;  Surgeon: Satira Sark, MD;  Location: [facility];  Service: Cardiovascular;  Laterality: N/A;   CARDIOVERSION N/A 10/28/2020   Procedure: CARDIOVERSION;  Surgeon: Satira Sark, MD;  [facility];  Service: Cardiovascular;  Laterality: N/A;   CARDIOVERSION N/A 06/10/2021   Procedure: CARDIOVERSION;  Surgeon: Skeet Latch, MD;  Location: [facility];  Service: Cardiovascular;  Laterality: N/A;   [place] ANGIOGRAM N/A 01/27/2015   Procedure: LEFT HEART CATHETERIZATION WITH CORONARY ANGIOGRAM;  Surgeon: Sinclair Grooms, MD;  Location: [facility] CATH [facility];  Service: Cardiovascular;  Laterality: N/A;   TEE WITHOUT CARDIOVERSION N/A 01/25/2015   Procedure: TRANSESOPHAGEAL ECHOCARDIOGRAM (TEE);  Surgeon: Satira Sark, MD;  Location: [facility];  Service: Cardiovascular;  Laterality: N/A;   TUBAL LIGATION  1980    Family History  Problem Relation Age of Onset   Cancer Mother    Colon cancer Mother    Diabetes type II Mother    Heart disease Father    Coronary artery disease Sister        died of an MI in her 54's   Heart disease Sister        dies at age 64   Diabetes type II Sister  Kidney disease Sister    Cancer Maternal Grandmother    Hypertension Other     Social History   Socioeconomic History   Marital status: Divorced    Spouse name: Not on file   Number of children: 2   Years of education: Not on file   Highest education level: Not on file  Occupational History   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   Social Determinants of Health   Financial Resource Strain: Not on file  Food Insecurity: Not on file  Transportation Needs: Not on file  Physical Activity: Not on file  Stress: Not on file  Social Connections: Not on file  Intimate Partner Violence: Not on file    ROS Review of Systems  Constitutional: Negative.   Respiratory: Negative.    Cardiovascular: Negative.   Endocrine: Negative.   Genitourinary: Negative.   Psychiatric/Behavioral: Negative.     Objective:   Today's Vitals: BP 138/84 (BP Location: Left Arm, Patient Position: Sitting, Cuff Size: Large)    Pulse 76    Ht 5' 6"  (1.676 m)    Wt (!) 350 lb (158.8 kg)    SpO2 97%    BMI 56.49 kg/m   Physical Exam Constitutional:      General: She is not in acute distress.    Appearance: She is obese. She is not ill-appearing, toxic-appearing or diaphoretic.  Cardiovascular:     Rate and Rhythm: Normal rate and regular rhythm.     Pulses: Normal pulses.     Heart sounds: Normal heart sounds. No murmur heard.   No friction rub. No gallop.  Pulmonary:     Effort: Pulmonary effort is normal. No respiratory distress.     Breath sounds: Normal breath sounds. No stridor. No wheezing, rhonchi or rales.  Chest:     Chest wall: No tenderness.  Abdominal:     Palpations: Abdomen is soft.  Skin:    Capillary Refill: Capillary refill takes less than 2 seconds.  Neurological:     Mental Status: She is alert and oriented to person, place, and time.  Psychiatric:        Mood and Affect: Mood normal.        Behavior: Behavior normal.        Thought Content: Thought content normal.        Judgment: Judgment normal.    Assessment & Plan:   Problem List Items Addressed This Visit        Cardiovascular and Mediastinum   Benign essential HTN     Takes losartan 50 mg daily, carvedilol 25 mg twice daily, diltiazem to 40 mg once daily, amiodarone 200 mg daily. BP Readings from Last 3 Encounters:  02/17/22 138/84  11/25/21 136/90  07/22/21 (!) 160/98  BP slightly elevated,continue current medication. CMP plus EGFR today      Persistent atrial fibrillation ([place])    Has had ablation done.  Takes amiodarone 200 mg daily, carvedilol 25 mg twice daily, diltiazem 240 mg once daily, Xarelto 20 mg  once daily.  Followed by Dr. [name].  Patient denies palpitation chest pain bloody stool. Patient encouraged to maintain close follow-up with cardiology she verbalized understanding        Endocrine   Hypothyroid- TSH WNL (Chronic)    Overdue for labs .check TSH today  takes Synthroid 150 mcg tablets daily Medication refilled. Lab Results  Component Value Date   TSH 55.47 (H) 09/27/2020          Relevant Medications   levothyroxine (SYNTHROID) 150 MCG tablet   Other Relevant Orders   TSH   CMP14+EGFR   Diabetes type 2, controlled ([place])    Lab Results  Component Value Date   HGBA1C 7.5 (H) 11/30/2020  Not on medications,  not on statin, on ARB A1c ordered.  Due for foot exam, diabetic eye exam needs to be on a statin Plan to start patient on Jardiance to assist with weight loss and diabetes management Patient states that she does not want injectable medications Has CKD stage III Avoid sugar soda cakes, needs to eat whole foods including plenty of vegetables and protein less carbohydrates discussed with patient, exercise 30 minutes 5 days a week advised.        Relevant Orders   HgB A1c   Lipid Profile     Genitourinary   Stage 3b chronic kidney disease ([place]) - Primary    Lab Results  Component Value Date   NA 139 06/09/2021   K 4.5 06/09/2021   CO2 26 06/09/2021   GLUCOSE 203 (H) 06/09/2021   BUN 36 (H) 06/09/2021   CREATININE 1.95 (H) 06/09/2021   CALCIUM  8.8 (L) 06/09/2021   GFRNONAA 28 (L) 06/09/2021  Chronic condition  patient not established with nephrology, referral to nephrology placed today. On ARB Avoid nephrotoxic agents. CMP plus EGFR today      Relevant Orders   Ambulatory referral to Nephrology   Other Visit Diagnoses     Encounter for screening mammogram for malignant neoplasm of breast       Relevant Orders   MM 3D SCREEN BREAST BILATERAL   Screening for colon cancer       Relevant Orders   Ambulatory referral to Gastroenterology       Outpatient Encounter Medications as of 02/17/2022  Medication Sig   amiodarone (PACERONE) 200 MG tablet TAKE 1 TABLET BY MOUTH ONCE DAILY --  APPOINTMENT  NEEDED  FOR  FURTHER  REFILLS  ([phone])   blood glucose meter kit and supplies KIT Dispense based on patient and insurance preference. Use up to four times daily as directed. (FOR ICD-9 250.00, 250.01).   Capsicum, Cayenne, (CAYENNE PO) Take 1 Scoop by mouth daily.    carvedilol (COREG) 25 MG tablet TAKE 1 TABLET BY MOUTH TWICE DAILY WITH A MEAL   cholecalciferol (VITAMIN D3) 25 MCG (1000 UNIT) tablet Take 1,000 Units by mouth daily.   CINNAMON PO Take 1 capsule by mouth daily.   diltiazem (CARDIZEM CD) 240 MG 24 hr capsule Take 1 capsule by mouth once daily   GARLIC PO Take 1 tablet by mouth daily.   Ginger, Zingiber officinalis, (GINGER EXTRACT PO) Take 1 tablet by mouth daily.    glucose blood (ACCU-CHEK AVIVA PLUS) test strip Check your blood glucose daily   losartan (COZAAR) 50 MG tablet Take 1 tablet (50 mg total) by mouth daily.   Multiple Vitamin (MULTIVITAMIN WITH MINERALS) TABS tablet Take 1 tablet by mouth daily.    Multiple Vitamins-Minerals (EMERGEN-C IMMUNE PO) Take  1 tablet by mouth daily.    oxymetazoline (AFRIN) 0.05 % nasal spray Place 1 spray into both nostrils as needed for congestion. For Nosebleeds, limit use.   rivaroxaban (XARELTO) 20 MG TABS tablet TAKE 1 TABLET BY MOUTH ONCE DAILY WITH SUPPER    [DISCONTINUED] levothyroxine (SYNTHROID) 150 MCG tablet Take 1 tablet (150 mcg total) by mouth daily.   levothyroxine (SYNTHROID) 150 MCG tablet Take 1 tablet (150 mcg total) by mouth daily.   No facility-administered encounter medications on file as of 02/17/2022.    Follow-up: Return in about 2 months (around 04/19/2022) for HLD, DM2.   Renee Rival, FNP

## 2022-02-17 NOTE — Patient Instructions (Signed)
Please get your fasting labs done on Monday  ? ? ?It is important that you exercise regularly at least 30 minutes 5 times a week.  ?Think about what you will eat, plan ahead. ?Choose " clean, green, fresh or frozen" over canned, processed or packaged foods which are more sugary, salty and fatty. ?70 to 75% of food eaten should be vegetables and fruit. ?Three meals at set times with snacks allowed between meals, but they must be fruit or vegetables. ?Aim to eat over a 12 hour period , example 7 am to 7 pm, and STOP after  your last meal of the day. ?Drink water,generally about 64 ounces per day, no other drink is as healthy. Fruit juice is best enjoyed in a healthy way, by EATING the fruit. ? ?Thanks for choosing [facility], we consider it a privelige to serve you. ? ?

## 2022-02-18 ENCOUNTER — Encounter: Payer: Self-pay | Admitting: Nurse Practitioner

## 2022-02-18 NOTE — Assessment & Plan Note (Signed)
Overdue for labs .check TSH today ? takes Synthroid 150 mcg tablets daily ?Medication refilled. ?Lab Results  ?Component Value Date  ? TSH 55.47 (H) 09/27/2020  ? ? ? ?

## 2022-02-18 NOTE — Assessment & Plan Note (Signed)
Lab Results  ?Component Value Date  ? NA 139 06/09/2021  ? K 4.5 06/09/2021  ? CO2 26 06/09/2021  ? GLUCOSE 203 (H) 06/09/2021  ? BUN 36 (H) 06/09/2021  ? CREATININE 1.95 (H) 06/09/2021  ? CALCIUM 8.8 (L) 06/09/2021  ? GFRNONAA 28 (L) 06/09/2021  ?Chronic condition  ?patient not established with nephrology, referral to nephrology placed today. ?On ARB ?Avoid nephrotoxic agents. ?CMP plus EGFR today ?

## 2022-02-18 NOTE — Assessment & Plan Note (Addendum)
Has had ablation done.  Takes amiodarone 100 mg daily, carvedilol 25 mg twice daily, diltiazem 240 mg once daily, Xarelto 20 mg once daily.  Followed by Dr. [name].  Patient denies palpitation chest pain bloody stool. ?Patient encouraged to maintain close follow-up with cardiology she verbalized understanding ?

## 2022-02-18 NOTE — Assessment & Plan Note (Addendum)
Takes losartan 50 mg daily, carvedilol 25 mg twice daily, diltiazem to 40 mg once daily, amiodarone 100 mg daily. ?BP Readings from Last 3 Encounters:  ?02/17/22 138/84  ?11/25/21 136/90  ?07/22/21 (!) 160/98  ?BP slightly elevated,continue current medication. ?CMP plus EGFR today ?

## 2022-02-18 NOTE — Assessment & Plan Note (Signed)
Lab Results  ?Component Value Date  ? HGBA1C 7.5 (H) 11/30/2020  ?Not on medications, ? not on statin, on ARB ?A1c ordered.  Due for foot exam, diabetic eye exam needs to be on a statin ?Plan to start patient on Jardiance to assist with weight loss and diabetes management ?Patient states that she does not want injectable medications ?Has CKD stage III ?Avoid sugar soda cakes, needs to eat whole foods including plenty of vegetables and protein less carbohydrates discussed with patient, exercise 30 minutes 5 days a week advised. ? ? ?

## 2022-02-27 ENCOUNTER — Encounter: Payer: Self-pay | Admitting: Internal Medicine

## 2022-02-27 ENCOUNTER — Other Ambulatory Visit: Payer: Self-pay | Admitting: Internal Medicine

## 2022-03-01 ENCOUNTER — Other Ambulatory Visit: Payer: Self-pay

## 2022-03-01 ENCOUNTER — Ambulatory Visit (HOSPITAL_COMMUNITY)
Admission: RE | Admit: 2022-03-01 | Discharge: 2022-03-01 | Disposition: A | Discharge status: Home / Self Care | Payer: 59 | Source: Ambulatory Visit | Attending: Nurse Practitioner | Admitting: Nurse Practitioner

## 2022-03-01 DIAGNOSIS — Z1231 Encounter for screening mammogram for malignant neoplasm of breast: Secondary | ICD-10-CM | POA: Insufficient documentation

## 2022-03-20 ENCOUNTER — Other Ambulatory Visit (HOSPITAL_COMMUNITY): Payer: Self-pay | Admitting: Nephrology

## 2022-03-20 DIAGNOSIS — E1122 Type 2 diabetes mellitus with diabetic chronic kidney disease: Secondary | ICD-10-CM

## 2022-03-20 DIAGNOSIS — Z79899 Other long term (current) drug therapy: Secondary | ICD-10-CM

## 2022-03-20 DIAGNOSIS — Z6841 Body Mass Index (BMI) 40.0 and over, adult: Secondary | ICD-10-CM

## 2022-03-20 DIAGNOSIS — I5022 Chronic systolic (congestive) heart failure: Secondary | ICD-10-CM

## 2022-03-20 DIAGNOSIS — I480 Paroxysmal atrial fibrillation: Secondary | ICD-10-CM

## 2022-03-20 DIAGNOSIS — I129 Hypertensive chronic kidney disease with stage 1 through stage 4 chronic kidney disease, or unspecified chronic kidney disease: Secondary | ICD-10-CM

## 2022-03-31 ENCOUNTER — Ambulatory Visit: Payer: 59 | Admitting: Gastroenterology

## 2022-03-31 ENCOUNTER — Encounter: Payer: Self-pay | Admitting: Gastroenterology

## 2022-03-31 ENCOUNTER — Other Ambulatory Visit: Payer: Self-pay

## 2022-03-31 DIAGNOSIS — Z1211 Encounter for screening for malignant neoplasm of colon: Secondary | ICD-10-CM | POA: Diagnosis not present

## 2022-03-31 DIAGNOSIS — Z7901 Long term (current) use of anticoagulants: Secondary | ICD-10-CM

## 2022-03-31 MED ORDER — PEG 3350-KCL-NA BICARB-NACL 420 G PO SOLR: 4000.0000 mL | ORAL | 0 refills | Status: DC

## 2022-03-31 NOTE — Progress Notes (Signed)
? ? ? ?GI Office Note   ? ?Referring Provider: Renee Rival, FNP ?Primary Care Physician:  Renee Rival, FNP  ?Primary Gastroenterologist: ? ?Chief Complaint  ? ?Chief Complaint  ?Patient presents with  ? Colonoscopy  ?  No current issues to discuss.   ? ? ? ?History of Present Illness  ? ?Isabella Hall is a 66 y.o. female presenting today at the request of NP [facility], for consideration of screening colonoscopy. ? ?Patient has never had a colonoscopy.  Family history of colon cancer, mother but at an advanced age (in her 65s).  Patient denies any abdominal pain, heartburn, vomiting, dysphagia, constipation, diarrhea, melena, weight loss.  She has history of diabetes, trying diet controlled. ? ?She is chronically anticoagulated for history of A-fib/a flutter, followed by cardiology.  Last seen in December 2022, was doing well status post ablation and subsequent cardioversion, maintaining sinus rhythm on amiodarone.  She has a history of chronic kidney disease stage IV followed by Dr. Theador Hawthorne. ? ? ?Medications  ? ?Current Outpatient Medications  ?Medication Sig Dispense Refill  ? amiodarone (PACERONE) 200 MG tablet TAKE 1 TABLET BY MOUTH ONCE DAILY --  APPOINTMENT  NEEDED  FOR  FURTHER  REFILLS  ([phone]) 30 tablet 6  ? Capsicum, Cayenne, (CAYENNE PO) Take 1 Scoop by mouth daily.     ? carvedilol (COREG) 25 MG tablet TAKE 1 TABLET BY MOUTH TWICE DAILY WITH A MEAL 180 tablet 3  ? cholecalciferol (VITAMIN D3) 25 MCG (1000 UNIT) tablet Take 1,000 Units by mouth daily.    ? CINNAMON PO Take 1 capsule by mouth daily.    ? diltiazem (CARDIZEM CD) 240 MG 24 hr capsule Take 1 capsule by mouth once daily 90 capsule 3  ? GARLIC PO Take 1 tablet by mouth daily.    ? Ginger, Zingiber officinalis, (GINGER EXTRACT PO) Take 1 tablet by mouth daily.     ? levothyroxine (SYNTHROID) 150 MCG tablet Take 1 tablet (150 mcg total) by mouth daily. 30 tablet 2  ? losartan (COZAAR) 50 MG tablet Take 1 tablet by mouth once  daily 90 tablet 1  ? Multiple Vitamin (MULTIVITAMIN WITH MINERALS) TABS tablet Take 1 tablet by mouth daily.     ? Multiple Vitamins-Minerals (EMERGEN-C IMMUNE PO) Take 1 tablet by mouth daily.     ? rivaroxaban (XARELTO) 20 MG TABS tablet TAKE 1 TABLET BY MOUTH ONCE DAILY WITH SUPPER 30 tablet 5  ? blood glucose meter kit and supplies KIT Dispense based on patient and insurance preference. Use up to four times daily as directed. (FOR ICD-9 250.00, 250.01). (Patient not taking: Reported on 03/31/2022) 1 each 0  ? glucose blood (ACCU-CHEK AVIVA PLUS) test strip Check your blood glucose daily (Patient not taking: Reported on 03/31/2022) 100 each 12  ? ?No current facility-administered medications for this visit.  ? ? ?Allergies  ? ?Allergies as of 03/31/2022  ? (No Known Allergies)  ? ? ?Past Medical History  ? ?Past Medical History:  ?Diagnosis Date  ? Atrial fibrillation ([place])    ? CHADSVASC score 4  ? CKD (chronic kidney disease)   ? Essential hypertension   ? Hypothyroidism   ? Obesity   ? Type 2 diabetes, diet controlled ([place])   ? Typical atrial flutter ([place]) 09/2020  ? ? ?Past Surgical History  ? ?Past Surgical History:  ?Procedure Laterality Date  ? APPENDECTOMY  1979  ? ATRIAL FIBRILLATION ABLATION  03/26/2018  ? ATRIAL FIBRILLATION ABLATION N/A  03/26/2018  ? Procedure: ATRIAL FIBRILLATION ABLATION;  Surgeon: Thompson Grayer, MD;  Location: [place];  Service: Cardiovascular;  Laterality: N/A;  ? CARDIOVERSION N/A 01/25/2015  ? Procedure: CARDIOVERSION;  Surgeon: Satira Sark, MD;  [facility];  Service: Cardiovascular;  Laterality: N/A;  ? CARDIOVERSION N/A 10/28/2020  ? Procedure: CARDIOVERSION;  Surgeon: Satira Sark, MD;  [facility];  Service: Cardiovascular;  Laterality: N/A;  ? CARDIOVERSION N/A 06/10/2021  ? Procedure: CARDIOVERSION;  Surgeon: Skeet Latch, MD;  Location: [facility];  Service: Cardiovascular;  Laterality: N/A;  ? [place]  ? LEFT  HEART CATHETERIZATION WITH CORONARY ANGIOGRAM N/A 01/27/2015  ? Procedure: LEFT HEART CATHETERIZATION WITH CORONARY ANGIOGRAM;  Surgeon: Sinclair Grooms, MD;  Location: [facility];  Service: Cardiovascular;  Laterality: N/A;  ? TEE WITHOUT CARDIOVERSION N/A 01/25/2015  ? Procedure: TRANSESOPHAGEAL ECHOCARDIOGRAM (TEE);  Surgeon: Satira Sark, MD;  Location: [facility];  Service: Cardiovascular;  Laterality: N/A;  ? [place]  ? ? ?Past Family History  ? ?Family History  ?Problem Relation Age of Onset  ? Cancer Mother   ? Colon cancer Mother   ?     in 53s  ? Diabetes type II Mother   ? Heart disease Father   ? Coronary artery disease Sister   ?     died of an MI in her 67's  ? Heart disease Sister   ?     dies at age 18  ? Diabetes type II Sister   ? Kidney disease Sister   ? Cancer Maternal Grandmother   ?     ?leukemia  ? Hypertension Other   ? Leukemia Nephew   ?     30  ? ? ?Past Social History  ? ?Social History  ? ?Socioeconomic History  ? Marital status: Divorced  ?  Spouse name: Not on file  ? Number of children: 2  ? Years of education: Not on file  ? Highest education level: Not on file  ?Occupational History  ? 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 Smoking status: Never  ? Smokeless tobacco: Never  ?Vaping Use  ? Vaping Use: Never used  ?Substance and Sexual Activity  ? Alcohol use: No  ?  Alcohol/week: 0.0 standard drinks  ? Drug use: No  ? Sexual activity: Not Currently  ?  Birth control/protection: Surgical  ?Other Topics Concern  ? Not on file  ?Social History Narrative  ? Divorced for 6 years,was married 12 years.Lives alone.Works as Corporate treasurer for [employer] .  ? ?Social Determinants of Health  ? ?Financial Resource Strain: Not on file  ?Food Insecurity: Not on file  ?Transportation Needs: Not on file  ?Physical Activity: Not on file  ?Stress: Not on file  ?Social Connections: Not on file  ?Intimate Partner Violence: Not on file  ? ? ?Review of Systems   ? ?General: Negative for anorexia, weight loss, fever, chills, fatigue, weakness. ?Eyes: Negative for vision changes.  ?ENT: Negative for hoarseness, difficulty swallowing , nasal congestion. ?CV: Negative for chest pain, angina, palpitations, dyspnea on exertion, peripheral edema.  ?Respiratory: Negative for dyspnea at rest, dyspnea on exertion, cough, sputum, wheezing.  ?GI: See history of present illness. ?GU:  Negative for dysuria, hematuria, urinary incontinence, urinary frequency, nocturnal urination.  ?MS: Negative for joint pain, low back pain.  ?Derm: Negative for rash or itching.  ?Neuro: Negative for weakness,  abnormal sensation, seizure, frequent headaches, memory loss,  ?confusion.  ?Psych: Negative for anxiety, depression, suicidal ideation, hallucinations.  ?Endo: Negative for unusual weight change.  ?Heme: Negative for bruising or bleeding. ?Allergy: Negative for rash or hives. ? ?Physical Exam  ? ?BP (!) 146/82 (BP Location: Right Arm, Patient Position: Sitting, Cuff Size: Large)   Pulse 63   Temp (!) 96.6 ?F (35.9 ?C) (Temporal)   Ht _0  (1.676 m)   Wt (!) 347 lb 3.2 oz (157.5 kg)   SpO2 96%   BMI 56.04 kg/m?  ?  ?General: Well-nourished, well-developed in no acute distress.  ?Head: Normocephalic, atraumatic.   ?Eyes: Conjunctiva pink, no icterus. ?Mouth: Oropharyngeal mucosa moist and pink , no lesions erythema or exudate. ?Neck: Supple without thyromegaly, masses, or lymphadenopathy.  ?Lungs: Clear to auscultation bilaterally.  ?Heart: Regular rate and rhythm, no murmurs rubs or gallops.  ?Abdomen: Bowel sounds are normal, nontender, nondistended, no hepatosplenomegaly or masses,  ?no abdominal bruits or hernia, no rebound or guarding.   ?Rectal: not performed ?Extremities: No lower extremity edema. No clubbing or deformities.  ?Neuro: Alert and oriented x 4 , grossly normal neurologically.  ?Skin: Warm and dry, no rash or jaundice.   ?Psych: Alert and cooperative, normal mood and  affect. ? ?Labs  ? ?Lab Results  ?Component Value Date  ? ALT 20 06/09/2021  ? AST 19 06/09/2021  ? ALKPHOS 43 06/09/2021  ? BILITOT 0.6 06/09/2021  ? ?Lab Results  ?Component Value Date  ? CREATININE 1.95 (H) 06/09/2021  ?

## 2022-03-31 NOTE — Patient Instructions (Signed)
Colonoscopy to be scheduled. See separate instructions.  ?You will need to hold your Xarelto for full 48 hours prior to your procedure.  ?

## 2022-04-03 ENCOUNTER — Ambulatory Visit (HOSPITAL_COMMUNITY)
Admission: RE | Admit: 2022-04-03 | Discharge: 2022-04-03 | Disposition: A | Discharge status: Home / Self Care | Payer: 59 | Source: Ambulatory Visit | Attending: Nephrology | Admitting: Nephrology

## 2022-04-03 DIAGNOSIS — I5022 Chronic systolic (congestive) heart failure: Secondary | ICD-10-CM | POA: Diagnosis present

## 2022-04-03 DIAGNOSIS — I480 Paroxysmal atrial fibrillation: Secondary | ICD-10-CM | POA: Diagnosis present

## 2022-04-03 DIAGNOSIS — Z6841 Body Mass Index (BMI) 40.0 and over, adult: Secondary | ICD-10-CM | POA: Diagnosis present

## 2022-04-03 DIAGNOSIS — Z79899 Other long term (current) drug therapy: Secondary | ICD-10-CM | POA: Diagnosis present

## 2022-04-03 DIAGNOSIS — N181 Chronic kidney disease, stage 1: Secondary | ICD-10-CM | POA: Insufficient documentation

## 2022-04-03 DIAGNOSIS — I129 Hypertensive chronic kidney disease with stage 1 through stage 4 chronic kidney disease, or unspecified chronic kidney disease: Secondary | ICD-10-CM | POA: Insufficient documentation

## 2022-04-03 DIAGNOSIS — E1122 Type 2 diabetes mellitus with diabetic chronic kidney disease: Secondary | ICD-10-CM | POA: Diagnosis present

## 2022-04-06 ENCOUNTER — Other Ambulatory Visit: Payer: Self-pay | Admitting: Internal Medicine

## 2022-04-06 ENCOUNTER — Other Ambulatory Visit: Payer: Self-pay

## 2022-04-06 DIAGNOSIS — I10 Essential (primary) hypertension: Secondary | ICD-10-CM

## 2022-04-06 DIAGNOSIS — E1165 Type 2 diabetes mellitus with hyperglycemia: Secondary | ICD-10-CM

## 2022-04-06 DIAGNOSIS — E039 Hypothyroidism, unspecified: Secondary | ICD-10-CM

## 2022-04-06 NOTE — Telephone Encounter (Signed)
Prescription refill request for Xarelto received.  ?Indication: Atrial Fib ?Last office visit: 11/25/21  Clearnce Hasten MD ?Weight: 157.4kg ?Age: 66 ?Scr: 1.95 on 06/09/21 ?CrCl: 72.42 ? ?Based on above findings Xarelto 20mg  daily is the appropriate dose.  Refill approved. ? ?

## 2022-04-08 LAB — LIPID PANEL
Cholesterol: 231 mg/dL — ABNORMAL HIGH (ref <200)
HDL: 60 mg/dL (ref >=50)
LDL Cholesterol (Calc): 149 mg/dL (calc) — ABNORMAL HIGH
Non-HDL Cholesterol (Calc): 171 mg/dL (calc) — ABNORMAL HIGH (ref <130)
Total CHOL/HDL Ratio: 3.9 (calc) (ref <5.0)
Triglycerides: 106 mg/dL (ref <150)

## 2022-04-08 LAB — TSH: TSH: 3.34 mIU/L (ref 0.40–4.50)

## 2022-04-11 NOTE — Progress Notes (Signed)
I will discuss results with pt at her next follw up and recommendation for  additional medications to help manage her chronic medical conditions

## 2022-04-21 ENCOUNTER — Encounter: Payer: Self-pay | Admitting: Nurse Practitioner

## 2022-04-21 ENCOUNTER — Ambulatory Visit (INDEPENDENT_AMBULATORY_CARE_PROVIDER_SITE_OTHER): Payer: 59 | Admitting: Nurse Practitioner

## 2022-04-21 VITALS — BP 142/88 | HR 56 | Ht 66.0 in | Wt 350.1 lb

## 2022-04-21 DIAGNOSIS — E1165 Type 2 diabetes mellitus with hyperglycemia: Secondary | ICD-10-CM

## 2022-04-21 DIAGNOSIS — E785 Hyperlipidemia, unspecified: Secondary | ICD-10-CM

## 2022-04-21 DIAGNOSIS — E039 Hypothyroidism, unspecified: Secondary | ICD-10-CM | POA: Diagnosis not present

## 2022-04-21 DIAGNOSIS — N184 Chronic kidney disease, stage 4 (severe): Secondary | ICD-10-CM

## 2022-04-21 DIAGNOSIS — E1169 Type 2 diabetes mellitus with other specified complication: Secondary | ICD-10-CM | POA: Diagnosis not present

## 2022-04-21 DIAGNOSIS — E1122 Type 2 diabetes mellitus with diabetic chronic kidney disease: Secondary | ICD-10-CM | POA: Diagnosis not present

## 2022-04-21 DIAGNOSIS — I10 Essential (primary) hypertension: Secondary | ICD-10-CM

## 2022-04-21 DIAGNOSIS — Z139 Encounter for screening, unspecified: Secondary | ICD-10-CM

## 2022-04-21 MED ORDER — ATORVASTATIN CALCIUM 10 MG PO TABS: 10.0000 mg | ORAL_TABLET | Freq: Every day | ORAL | 3 refills | Status: DC

## 2022-04-21 NOTE — Patient Instructions (Addendum)
?  Pleast star taking atorvastatin 10mg  daily for your high cholesterol  ?Please schedule your diabetic eye exam today  ?Please get your labs done 3-5 days before your next visit.  ? ? ?It is important that you exercise regularly at least 30 minutes 5 times a week.  ?Think about what you will eat, plan ahead. ?Choose " clean, green, fresh or frozen" over canned, processed or packaged foods which are more sugary, salty and fatty. ?70 to 75% of food eaten should be vegetables and fruit. ?Three meals at set times with snacks allowed between meals, but they must be fruit or vegetables. ?Aim to eat over a 12 hour period , example 7 am to 7 pm, and STOP after  your last meal of the day. ?Drink water,generally about 64 ounces per day, no other drink is as healthy. Fruit juice is best enjoyed in a healthy way, by EATING the fruit. ? ?Thanks for choosing [facility], we consider it a privelige to serve you.  ?

## 2022-04-22 DIAGNOSIS — E1169 Type 2 diabetes mellitus with other specified complication: Secondary | ICD-10-CM | POA: Insufficient documentation

## 2022-04-22 NOTE — Assessment & Plan Note (Signed)
Lab Results  ?Component Value Date  ? CHOL 231 (H) 04/06/2022  ? HDL 60 04/06/2022  ? LDLCALC 149 (H) 04/06/2022  ? TRIG 106 04/06/2022  ? CHOLHDL 3.9 04/06/2022  ?Currently not on a statin ?Start atorvastatin 10 mg daily ?Avoid fatty fried foods report muscle aches ?Follow-up in 6 weeks. ?

## 2022-04-22 NOTE — Assessment & Plan Note (Signed)
Chronic uncontrolled condition ?Recent A1c 9.8 ?Started on Farxiga 5 mg tablet by nephrology, patient stated that she was told to take medication 3 times weekly and follow-up in 2 months.  Notes from nephrology states that patient should take medication once daily, patient advised to confirm from nephrology about dosing frequency verbalized understanding.  ? ?Avoid sugar sweets soda juice engage in regular exercises at least 150 minutes weekly ?Foot exam completed, normal, diabetic eye exam scheduled today ?Atorvastatin ordered for hyperlipidemia today ?Up-to-date with urine creatinine albumin lab- has proteinuria ?

## 2022-04-22 NOTE — Progress Notes (Signed)
? ?DARLINDA Hall     MRN: [id number]      DOB: Oct 27, 1956 ? ? ?HPI ?[name] i with past medical history of benign essential hypertension, A-fib, hypothyroidism, uncontrolled type 2 diabetes, chronic kidney disease, obesity, s here for follow up and re-evaluation of chronic medical conditions, medication management and review of any available recent lab and radiology data.  ? ?States that she has since established care with nephrology, she was started on Farxiga 5 mg tablet for her CKD and diabetes, states that she had lab work done for nephrology. ? ?The PT denies any adverse reactions to current medications since the last visit.  ? ?Has upcoming screening colonoscopy on 5/22, will arrange for Pap smear at next follow-up appointment ? ? ?ROS ?Denies recent fever or chills. ?Denies sinus pressure, nasal congestion, ear pain or sore throat. ?Denies chest congestion, productive cough or wheezing. ?Denies chest pains, palpitations and leg swelling ?Denies abdominal pain, nausea, vomiting,diarrhea or constipation.   ?Denies dysuria, frequency, hesitancy or incontinence. ?Denies joint pain, swelling and limitation in mobility. ?Denies headaches, seizures, numbness, or tingling. ?Denies depression, anxiety or insomnia. ? ? ? ?PE ? ?BP (!) 142/88 (BP Location: Right Arm, Cuff Size: Large)   Pulse (!) 56   Ht 5\' 6"  (1.676 m)   Wt (!) 350 lb 1.3 oz (158.8 kg)   SpO2 91%   BMI 56.50 kg/m?  ? ?Patient alert and oriented and in no cardiopulmonary distress. ? ? ?Chest: Clear to auscultation bilaterally. ? ?CVS: S1, S2 no murmurs, no S3.Regular rate. ? ?ABD: Soft non tender.  ? ?Ext: No edema ? ?MS: Adequate ROM spine, shoulders, hips and knees. ? ?Psych: Good eye contact, normal affect. Memory intact not anxious or depressed appearing. ? ?CNS: CN 2-12 intact, power,  normal throughout.no focal deficits noted. ? ? ?Assessment & Plan ? ?Benign essential HTN ?BP Readings from Last 3 Encounters:  ?04/21/22 (!) 142/88   ?03/31/22 (!) 146/82  ?02/17/22 138/84  ?Currently on losartan 50 mg daily, diltiazem 240 mg daily, carvedilol 25 mg twice daily amiodarone 200 mg daily.  Recently started on Farxiga by nephrology, appreciate collaboration with nephrology ?Blood pressure not at goal of less than 130/80, flex sig should help with lowering BP ?DASH diet advised, engage in exercises at least 150 minutes weekly ?Follow-up in 6 weeks. ? ?Hypothyroid- TSH WNL ?Lab Results  ?Component Value Date  ? TSH 3.34 04/06/2022  ?Chronic condition well-controlled on levothyroxine 150 mcg tablets daily ?Continue current medication ? ?Type 2 diabetes mellitus with diabetic chronic kidney disease ([place]) ?Chronic uncontrolled condition ?Recent A1c 9.8 ?Started on Farxiga 5 mg tablet by nephrology, patient stated that she was told to take medication 3 times weekly and follow-up in 2 months.  Notes from nephrology states that patient should take medication once daily, patient advised to confirm from nephrology about dosing frequency verbalized understanding.  ? ?Avoid sugar sweets soda juice engage in regular exercises at least 150 minutes weekly ?Foot exam completed, normal, diabetic eye exam scheduled today ?Atorvastatin ordered for hyperlipidemia today ?Up-to-date with urine creatinine albumin lab- has proteinuria ? ?Chronic kidney disease due to type 2 diabetes mellitus ([place]) ?Chronic condition she has established care with nephrology ?Current GFR 28, creatinine 1.91 ?Started on Farxiga 5 mg tablet ?Avoid NSAIDs and other nephrotoxic agents, drink at least 64 ounces of water daily to stay hydrated. ?Patient encouraged to maintain close follow-up with nephrology she verbalized understanding. ? ?Hyperlipidemia associated with type 2 diabetes mellitus ([place]) ?Lab  Results  ?Component Value Date  ? CHOL 231 (H) 04/06/2022  ? HDL 60 04/06/2022  ? LDLCALC 149 (H) 04/06/2022  ? TRIG 106 04/06/2022  ? CHOLHDL 3.9 04/06/2022  ?Currently not on a statin ?Start  atorvastatin 10 mg daily ?Avoid fatty fried foods report muscle aches ?Follow-up in 6 weeks.  ?

## 2022-04-22 NOTE — Assessment & Plan Note (Signed)
BP Readings from Last 3 Encounters:  ?04/21/22 (!) 142/88  ?03/31/22 (!) 146/82  ?02/17/22 138/84  ?Currently on losartan 50 mg daily, diltiazem 240 mg daily, carvedilol 25 mg twice daily amiodarone 200 mg daily.  Recently started on Farxiga by nephrology, appreciate collaboration with nephrology ?Blood pressure not at goal of less than 130/80, flex sig should help with lowering BP ?DASH diet advised, engage in exercises at least 150 minutes weekly ?Follow-up in 6 weeks. ?

## 2022-04-22 NOTE — Assessment & Plan Note (Signed)
Lab Results  ?Component Value Date  ? TSH 3.34 04/06/2022  ?Chronic condition well-controlled on levothyroxine 150 mcg tablets daily ?Continue current medication ?

## 2022-04-22 NOTE — Assessment & Plan Note (Signed)
Chronic condition she has established care with nephrology ?Current GFR 28, creatinine 1.91 ?Started on Farxiga 5 mg tablet ?Avoid NSAIDs and other nephrotoxic agents, drink at least 64 ounces of water daily to stay hydrated. ?Patient encouraged to maintain close follow-up with nephrology she verbalized understanding. ?

## 2022-05-02 NOTE — Patient Instructions (Signed)
? ? ? ? ? ? Isabella Hall ? 05/02/2022  ?  ? @PREFPERIOPPHARMACY @ ? ? Your procedure is scheduled on  05/08/2022. ? ? Report to [facility] at  1300 (1:00)  P.M. ? ? Call this number if you have problems the morning of surgery: ? [phone] ? ? Remember: ? Follow the diet and prep instructions given to you by the office. ? ?  Your last dose of xarelto should be 05/05/2022. ? ? ?  DO NOT take any medications for diabetes the morning of your procedure. ? ?  ? Take these medicines the morning of surgery with A SIP OF WATER  ? ?                pacerone, carvedilol, diltiazem, levothyroxine. ?  ? ? Do not wear jewelry, make-up or nail polish. ? Do not wear lotions, powders, or perfumes, or deodorant. ? Do not shave 48 hours prior to surgery.  Men may shave face and neck. ? Do not bring valuables to the hospital. ? [facility] is not responsible for any belongings or valuables. ? ?Contacts, dentures or bridgework may not be worn into surgery.  Leave your suitcase in the car.  After surgery it may be brought to your room. ? ?For patients admitted to the hospital, discharge time will be determined by your treatment team. ? ?Patients discharged the day of surgery will not be allowed to drive home and must have someone with them for 24 hours.  ? ? ?Special instructions:   DO NOT smoke tobacco or vape for 24 hours before your procedure. ? ?Please read over the following fact sheets that you were given. ?Anesthesia Post-op Instructions and Care and Recovery After Surgery ?  ? ? ? Colonoscopy, Adult, Care After ?The following information offers guidance on how to care for yourself after your procedure. Your health care provider may also give you more specific instructions. If you have problems or questions, contact your health care provider. ?What can I expect after the procedure? ?After the procedure, it is common to have: ?A small amount of blood in your stool for 24 hours after the procedure. ?Some gas. ?Mild cramping or  bloating of your abdomen. ?Follow these instructions at home: ?Eating and drinking ? ?Drink enough fluid to keep your urine pale yellow. ?Follow instructions from your health care provider about eating or drinking restrictions. ?Resume your normal diet as told by your health care provider. Avoid heavy or fried foods that are hard to digest. ?Activity ?Rest as told by your health care provider. ?Avoid sitting for a long time without moving. Get up to take short walks every 1-2 hours. This is important to improve blood flow and breathing. Ask for help if you feel weak or unsteady. ?Return to your normal activities as told by your health care provider. Ask your health care provider what activities are safe for you. ?Managing cramping and bloating ? ?Try walking around when you have cramps or feel bloated. ?If directed, apply heat to your abdomen as told by your health care provider. Use the heat source that your health care provider recommends, such as a moist heat pack or a heating pad. ?Place a towel between your skin and the heat source. ?Leave the heat on for 20-30 minutes. ?Remove the heat if your skin turns bright red. This is especially important if you are unable to feel pain, heat, or cold. You have a greater risk of getting burned. ?General instructions ?If you  were given a sedative during the procedure, it can affect you for several hours. Do not drive or operate machinery until your health care provider says that it is safe. ?For the first 24 hours after the procedure: ?Do not sign important documents. ?Do not drink alcohol. ?Do your regular daily activities at a slower pace than normal. ?Eat soft foods that are easy to digest. ?Take over-the-counter and prescription medicines only as told by your health care provider. ?Keep all follow-up visits. This is important. ?Contact a health care provider if: ?You have blood in your stool 2-3 days after the procedure. ?Get help right away if: ?You have more than a  small spotting of blood in your stool. ?You have large blood clots in your stool. ?You have swelling of your abdomen. ?You have nausea or vomiting. ?You have a fever. ?You have increasing pain in your abdomen that is not relieved with medicine. ?These symptoms may be an emergency. Get help right away. Call 911. ?Do not wait to see if the symptoms will go away. ?Do not drive yourself to the hospital. ?Summary ?After the procedure, it is common to have a small amount of blood in your stool. You may also have mild cramping and bloating of your abdomen. ?If you were given a sedative during the procedure, it can affect you for several hours. Do not drive or operate machinery until your health care provider says that it is safe. ?Get help right away if you have a lot of blood in your stool, nausea or vomiting, a fever, or increased pain in your abdomen. ?This information is not intended to replace advice given to you by your health care provider. Make sure you discuss any questions you have with your health care provider. ?Document Revised: 07/27/2021 Document Reviewed: 07/27/2021 ?Elsevier Patient Education ? [place]. ?Monitored Anesthesia Care, Care After ?This sheet gives you information about how to care for yourself after your procedure. Your health care provider may also give you more specific instructions. If you have problems or questions, contact your health care provider. ?What can I expect after the procedure? ?After the procedure, it is common to have: ?Tiredness. ?Forgetfulness about what happened after the procedure. ?Impaired judgment for important decisions. ?Nausea or vomiting. ?Some difficulty with balance. ?Follow these instructions at home: ?For the time period you were told by your health care provider: ? ?  ? ?Rest as needed. ?Do not participate in activities where you could fall or become injured. ?Do not drive or use machinery. ?Do not drink alcohol. ?Do not take sleeping pills or  medicines that cause drowsiness. ?Do not make important decisions or sign legal documents. ?Do not take care of children on your own. ?Eating and drinking ?Follow the diet that is recommended by your health care provider. ?Drink enough fluid to keep your urine pale yellow. ?If you vomit: ?Drink water, juice, or soup when you can drink without vomiting. ?Make sure you have little or no nausea before eating solid foods. ?General instructions ?Have a responsible adult stay with you for the time you are told. It is important to have someone help care for you until you are awake and alert. ?Take over-the-counter and prescription medicines only as told by your health care provider. ?If you have sleep apnea, surgery and certain medicines can increase your risk for breathing problems. Follow instructions from your health care provider about wearing your sleep device: ?Anytime you are sleeping, including during daytime naps. ?While taking prescription pain medicines, sleeping  medicines, or medicines that make you drowsy. ?Avoid smoking. ?Keep all follow-up visits as told by your health care provider. This is important. ?Contact a health care provider if: ?You keep feeling nauseous or you keep vomiting. ?You feel light-headed. ?You are still sleepy or having trouble with balance after 24 hours. ?You develop a rash. ?You have a fever. ?You have redness or swelling around the IV site. ?Get help right away if: ?You have trouble breathing. ?You have new-onset confusion at home. ?Summary ?For several hours after your procedure, you may feel tired. You may also be forgetful and have poor judgment. ?Have a responsible adult stay with you for the time you are told. It is important to have someone help care for you until you are awake and alert. ?Rest as told. Do not drive or operate machinery. Do not drink alcohol or take sleeping pills. ?Get help right away if you have trouble breathing, or if you suddenly become confused. ?This  information is not intended to replace advice given to you by your health care provider. Make sure you discuss any questions you have with your health care provider. ?Document Revised: 11/08/2021 Document Reviewed:

## 2022-05-04 ENCOUNTER — Telehealth: Payer: Self-pay | Admitting: *Deleted

## 2022-05-04 ENCOUNTER — Encounter: Payer: Self-pay | Admitting: *Deleted

## 2022-05-04 ENCOUNTER — Encounter (HOSPITAL_COMMUNITY)
Admission: RE | Admit: 2022-05-04 | Discharge: 2022-05-04 | Disposition: A | Discharge status: Home / Self Care | Payer: 59 | Source: Ambulatory Visit | Attending: Internal Medicine | Admitting: Internal Medicine

## 2022-05-04 DIAGNOSIS — E1122 Type 2 diabetes mellitus with diabetic chronic kidney disease: Secondary | ICD-10-CM

## 2022-05-04 NOTE — Telephone Encounter (Signed)
Had VM from pt. She needed to rescheduled procedure scheduled for 5/22. She has been moved to 6/30 at 8:30am. Aware will mail new instructions/pre-op appt. Already has prep.

## 2022-05-13 ENCOUNTER — Other Ambulatory Visit: Payer: Self-pay | Admitting: Nurse Practitioner

## 2022-05-13 DIAGNOSIS — E039 Hypothyroidism, unspecified: Secondary | ICD-10-CM

## 2022-05-25 ENCOUNTER — Ambulatory Visit (HOSPITAL_COMMUNITY): Payer: 59 | Admitting: Nurse Practitioner

## 2022-06-01 ENCOUNTER — Ambulatory Visit (HOSPITAL_COMMUNITY): Payer: 59 | Admitting: Nurse Practitioner

## 2022-06-08 NOTE — Patient Instructions (Signed)
Isabella Hall  06/08/2022     @PREFPERIOPPHARMACY @   Your procedure is scheduled on  06/16/2022.   Report to [facility] at  0700  A.M.   Call this number if you have problems the morning of surgery:  [phone]   Remember:  Follow the diet and prep instructions given to you by the office.     Your last dose of Xarelto should be on 06/13/2022.      Take these medicines the morning of surgery with A SIP OF WATER         pacerone, coreg, cardiazem, synthroid.     Do not wear jewelry, make-up or nail polish.  Do not wear lotions, powders, or perfumes, or deodorant.  Do not shave 48 hours prior to surgery.  Men may shave face and neck.  Do not bring valuables to the hospital.  [facility] is not responsible for any belongings or valuables.  Contacts, dentures or bridgework may not be worn into surgery.  Leave your suitcase in the car.  After surgery it may be brought to your room.  For patients admitted to the hospital, discharge time will be determined by your treatment team.  Patients discharged the day of surgery will not be allowed to drive home and must have someone with them for 24 hours.    Special instructions:   DO NOT smoke tobacco or vape for 24 hours before your procedure.  Please read over the following fact sheets that you were given. Anesthesia Post-op Instructions and Care and Recovery After Surgery      Colonoscopy, Adult, Care After The following information offers guidance on how to care for yourself after your procedure. Your health care provider may also give you more specific instructions. If you have problems or questions, contact your health care provider. What can I expect after the procedure? After the procedure, it is common to have: A small amount of blood in your stool for 24 hours after the procedure. Some gas. Mild cramping or bloating of your abdomen. Follow these instructions at home: Eating and drinking  Drink enough  fluid to keep your urine pale yellow. Follow instructions from your health care provider about eating or drinking restrictions. Resume your normal diet as told by your health care provider. Avoid heavy or fried foods that are hard to digest. Activity Rest as told by your health care provider. Avoid sitting for a long time without moving. Get up to take short walks every 1-2 hours. This is important to improve blood flow and breathing. Ask for help if you feel weak or unsteady. Return to your normal activities as told by your health care provider. Ask your health care provider what activities are safe for you. Managing cramping and bloating  Try walking around when you have cramps or feel bloated. If directed, apply heat to your abdomen as told by your health care provider. Use the heat source that your health care provider recommends, such as a moist heat pack or a heating pad. Place a towel between your skin and the heat source. Leave the heat on for 20-30 minutes. Remove the heat if your skin turns bright red. This is especially important if you are unable to feel pain, heat, or cold. You have a greater risk of getting burned. General instructions If you were given a sedative during the procedure, it can affect you for several hours. Do not drive or operate machinery until your health care  provider says that it is safe. For the first 24 hours after the procedure: Do not sign important documents. Do not drink alcohol. Do your regular daily activities at a slower pace than normal. Eat soft foods that are easy to digest. Take over-the-counter and prescription medicines only as told by your health care provider. Keep all follow-up visits. This is important. Contact a health care provider if: You have blood in your stool 2-3 days after the procedure. Get help right away if: You have more than a small spotting of blood in your stool. You have large blood clots in your stool. You have swelling  of your abdomen. You have nausea or vomiting. You have a fever. You have increasing pain in your abdomen that is not relieved with medicine. These symptoms may be an emergency. Get help right away. Call 911. Do not wait to see if the symptoms will go away. Do not drive yourself to the hospital. Summary After the procedure, it is common to have a small amount of blood in your stool. You may also have mild cramping and bloating of your abdomen. If you were given a sedative during the procedure, it can affect you for several hours. Do not drive or operate machinery until your health care provider says that it is safe. Get help right away if you have a lot of blood in your stool, nausea or vomiting, a fever, or increased pain in your abdomen. This information is not intended to replace advice given to you by your health care provider. Make sure you discuss any questions you have with your health care provider. Document Revised: 07/27/2021 Document Reviewed: 07/27/2021 Elsevier Patient Education  [place] After This sheet gives you information about how to care for yourself after your procedure. Your health care provider may also give you more specific instructions. If you have problems or questions, contact your health care provider. What can I expect after the procedure? After the procedure, it is common to have: Tiredness. Forgetfulness about what happened after the procedure. Impaired judgment for important decisions. Nausea or vomiting. Some difficulty with balance. Follow these instructions at home: For the time period you were told by your health care provider:     Rest as needed. Do not participate in activities where you could fall or become injured. Do not drive or use machinery. Do not drink alcohol. Do not take sleeping pills or medicines that cause drowsiness. Do not make important decisions or sign legal documents. Do not take care  of children on your own. Eating and drinking Follow the diet that is recommended by your health care provider. Drink enough fluid to keep your urine pale yellow. If you vomit: Drink water, juice, or soup when you can drink without vomiting. Make sure you have little or no nausea before eating solid foods. General instructions Have a responsible adult stay with you for the time you are told. It is important to have someone help care for you until you are awake and alert. Take over-the-counter and prescription medicines only as told by your health care provider. If you have sleep apnea, surgery and certain medicines can increase your risk for breathing problems. Follow instructions from your health care provider about wearing your sleep device: Anytime you are sleeping, including during daytime naps. While taking prescription pain medicines, sleeping medicines, or medicines that make you drowsy. Avoid smoking. Keep all follow-up visits as told by your health care provider. This is important. Contact  a health care provider if: You keep feeling nauseous or you keep vomiting. You feel light-headed. You are still sleepy or having trouble with balance after 24 hours. You develop a rash. You have a fever. You have redness or swelling around the IV site. Get help right away if: You have trouble breathing. You have new-onset confusion at home. Summary For several hours after your procedure, you may feel tired. You may also be forgetful and have poor judgment. Have a responsible adult stay with you for the time you are told. It is important to have someone help care for you until you are awake and alert. Rest as told. Do not drive or operate machinery. Do not drink alcohol or take sleeping pills. Get help right away if you have trouble breathing, or if you suddenly become confused. This information is not intended to replace advice given to you by your health care provider. Make sure you discuss  any questions you have with your health care provider. Document Revised: 11/08/2021 Document Reviewed: 11/06/2019 Elsevier Patient Education  [place].

## 2022-06-11 ENCOUNTER — Other Ambulatory Visit: Payer: Self-pay | Admitting: Nurse Practitioner

## 2022-06-11 DIAGNOSIS — E039 Hypothyroidism, unspecified: Secondary | ICD-10-CM

## 2022-06-13 ENCOUNTER — Encounter (HOSPITAL_COMMUNITY): Payer: Self-pay

## 2022-06-13 ENCOUNTER — Encounter (HOSPITAL_COMMUNITY)
Admission: RE | Admit: 2022-06-13 | Discharge: 2022-06-13 | Disposition: A | Discharge status: Home / Self Care | Payer: 59 | Source: Ambulatory Visit | Attending: Internal Medicine | Admitting: Internal Medicine

## 2022-06-13 DIAGNOSIS — Z01812 Encounter for preprocedural laboratory examination: Secondary | ICD-10-CM | POA: Diagnosis present

## 2022-06-13 DIAGNOSIS — N184 Chronic kidney disease, stage 4 (severe): Secondary | ICD-10-CM | POA: Insufficient documentation

## 2022-06-13 DIAGNOSIS — E1122 Type 2 diabetes mellitus with diabetic chronic kidney disease: Secondary | ICD-10-CM | POA: Insufficient documentation

## 2022-06-13 HISTORY — DX: Nausea with vomiting, unspecified: R11.2

## 2022-06-13 HISTORY — DX: Nausea with vomiting, unspecified: Z98.890

## 2022-06-13 LAB — BASIC METABOLIC PANEL
Anion gap: 11 (ref 5–15)
BUN: 37 mg/dL — ABNORMAL HIGH (ref 8–23)
CO2: 27 mmol/L (ref 22–32)
Calcium: 9.2 mg/dL (ref 8.9–10.3)
Chloride: 100 mmol/L (ref 98–111)
Creatinine, Ser: 2.24 mg/dL — ABNORMAL HIGH (ref 0.44–1.00)
GFR, Estimated: 24 mL/min — ABNORMAL LOW (ref >60)
Glucose, Bld: 250 mg/dL — ABNORMAL HIGH (ref 70–99)
Potassium: 4.5 mmol/L (ref 3.5–5.1)
Sodium: 138 mmol/L (ref 135–145)

## 2022-06-16 ENCOUNTER — Ambulatory Visit (HOSPITAL_BASED_OUTPATIENT_CLINIC_OR_DEPARTMENT_OTHER): Payer: 59 | Admitting: Certified Registered Nurse Anesthetist

## 2022-06-16 ENCOUNTER — Ambulatory Visit (HOSPITAL_COMMUNITY)
Admission: RE | Admit: 2022-06-16 | Discharge: 2022-06-16 | Disposition: A | Discharge status: Home / Self Care | Payer: 59 | Attending: Internal Medicine | Admitting: Internal Medicine

## 2022-06-16 ENCOUNTER — Encounter (HOSPITAL_COMMUNITY): Payer: Self-pay

## 2022-06-16 ENCOUNTER — Encounter (HOSPITAL_COMMUNITY)
Admission: RE | Disposition: A | Discharge status: Home / Self Care | Payer: Self-pay | Source: Home / Self Care | Attending: Internal Medicine

## 2022-06-16 ENCOUNTER — Ambulatory Visit (HOSPITAL_COMMUNITY): Payer: 59 | Admitting: Certified Registered Nurse Anesthetist

## 2022-06-16 DIAGNOSIS — Z7984 Long term (current) use of oral hypoglycemic drugs: Secondary | ICD-10-CM | POA: Diagnosis not present

## 2022-06-16 DIAGNOSIS — K5730 Diverticulosis of large intestine without perforation or abscess without bleeding: Secondary | ICD-10-CM | POA: Insufficient documentation

## 2022-06-16 DIAGNOSIS — Z8249 Family history of ischemic heart disease and other diseases of the circulatory system: Secondary | ICD-10-CM | POA: Insufficient documentation

## 2022-06-16 DIAGNOSIS — K648 Other hemorrhoids: Secondary | ICD-10-CM | POA: Insufficient documentation

## 2022-06-16 DIAGNOSIS — Z833 Family history of diabetes mellitus: Secondary | ICD-10-CM | POA: Insufficient documentation

## 2022-06-16 DIAGNOSIS — N189 Chronic kidney disease, unspecified: Secondary | ICD-10-CM | POA: Insufficient documentation

## 2022-06-16 DIAGNOSIS — I129 Hypertensive chronic kidney disease with stage 1 through stage 4 chronic kidney disease, or unspecified chronic kidney disease: Secondary | ICD-10-CM | POA: Diagnosis not present

## 2022-06-16 DIAGNOSIS — E039 Hypothyroidism, unspecified: Secondary | ICD-10-CM | POA: Insufficient documentation

## 2022-06-16 DIAGNOSIS — Z1211 Encounter for screening for malignant neoplasm of colon: Secondary | ICD-10-CM

## 2022-06-16 DIAGNOSIS — E1122 Type 2 diabetes mellitus with diabetic chronic kidney disease: Secondary | ICD-10-CM | POA: Insufficient documentation

## 2022-06-16 DIAGNOSIS — I4891 Unspecified atrial fibrillation: Secondary | ICD-10-CM | POA: Insufficient documentation

## 2022-06-16 DIAGNOSIS — N1832 Chronic kidney disease, stage 3b: Secondary | ICD-10-CM

## 2022-06-16 DIAGNOSIS — Z800 Family history of malignant neoplasm of digestive organs: Secondary | ICD-10-CM | POA: Diagnosis not present

## 2022-06-16 HISTORY — PX: COLONOSCOPY WITH PROPOFOL: SHX5780

## 2022-06-16 LAB — GLUCOSE, CAPILLARY: Glucose-Capillary: 177 mg/dL — ABNORMAL HIGH (ref 70–99)

## 2022-06-16 SURGERY — COLONOSCOPY WITH PROPOFOL
Anesthesia: General

## 2022-06-16 MED ORDER — LACTATED RINGERS IV SOLN: INTRAVENOUS | Status: DC | PRN

## 2022-06-16 MED ORDER — LIDOCAINE HCL (CARDIAC) PF 100 MG/5ML IV SOSY
PREFILLED_SYRINGE | INTRAVENOUS | Status: DC | PRN
  Administered 2022-06-16: 50 mg via INTRATRACHEAL

## 2022-06-16 MED ORDER — PROPOFOL 10 MG/ML IV BOLUS
INTRAVENOUS | Status: DC | PRN
  Administered 2022-06-16 (×3): 20 mg via INTRAVENOUS
  Administered 2022-06-16: 100 mg via INTRAVENOUS
  Administered 2022-06-16 (×3): 20 mg via INTRAVENOUS

## 2022-06-16 NOTE — Transfer of Care (Signed)
Immediate Anesthesia Transfer of Care Note  Patient: Isabella Hall  Procedure(s) Performed: COLONOSCOPY WITH PROPOFOL  Patient Location: Short Stay  Anesthesia Type:MAC  Level of Consciousness: awake, alert  and oriented  Airway & Oxygen Therapy: Patient Spontanous Breathing  Post-op Assessment: Report given to RN, Post -op Vital signs reviewed and stable and Patient moving all extremities X 4  Post vital signs: Reviewed and stable  Last Vitals:  Vitals Value Taken Time  BP    Temp    Pulse    Resp    SpO2      Last Pain:  Vitals:   06/16/22 0702  TempSrc: Oral  PainSc: 0-No pain      Patients Stated Pain Goal: 8 (94/07/68 0881)  Complications: No notable events documented.

## 2022-06-16 NOTE — Discharge Instructions (Signed)
  Colonoscopy Discharge Instructions  Read the instructions outlined below and refer to this sheet in the next few weeks. These discharge instructions provide you with general information on caring for yourself after you leave the hospital. Your doctor may also give you specific instructions. While your treatment has been planned according to the most current medical practices available, unavoidable complications occasionally occur.   ACTIVITY You may resume your regular activity, but move at a slower pace for the next 24 hours.  Take frequent rest periods for the next 24 hours.  Walking will help get rid of the air and reduce the bloated feeling in your belly (abdomen).  No driving for 24 hours (because of the medicine (anesthesia) used during the test).   Do not sign any important legal documents or operate any machinery for 24 hours (because of the anesthesia used during the test).  NUTRITION Drink plenty of fluids.  You may resume your normal diet as instructed by your doctor.  Begin with a light meal and progress to your normal diet. Heavy or fried foods are harder to digest and may make you feel sick to your stomach (nauseated).  Avoid alcoholic beverages for 24 hours or as instructed.  MEDICATIONS You may resume your normal medications unless your doctor tells you otherwise.  WHAT YOU CAN EXPECT TODAY Some feelings of bloating in the abdomen.  Passage of more gas than usual.  Spotting of blood in your stool or on the toilet paper.  IF YOU HAD POLYPS REMOVED DURING THE COLONOSCOPY: No aspirin products for 7 days or as instructed.  No alcohol for 7 days or as instructed.  Eat a soft diet for the next 24 hours.  FINDING OUT THE RESULTS OF YOUR TEST Not all test results are available during your visit. If your test results are not back during the visit, make an appointment with your caregiver to find out the results. Do not assume everything is normal if you have not heard from your  caregiver or the medical facility. It is important for you to follow up on all of your test results.  SEEK IMMEDIATE MEDICAL ATTENTION IF: You have more than a spotting of blood in your stool.  Your belly is swollen (abdominal distention).  You are nauseated or vomiting.  You have a temperature over 101.  You have abdominal pain or discomfort that is severe or gets worse throughout the day.   Your colonoscopy was relatively unremarkable.  I did not find any polyps or evidence of colon cancer.  I recommend repeating colonoscopy in 10 years for colon cancer screening purposes.  You do have diverticulosis and internal hemorrhoids. I would recommend increasing fiber in your diet or adding OTC Benefiber/Metamucil. Be sure to drink at least 4 to 6 glasses of water daily. Follow-up with GI as needed.  Okay to resume Xarelto immediately.   I hope you have a great rest of your week!  Elon Alas. Abbey Chatters, D.O. Gastroenterology and Hepatology [facility]

## 2022-06-16 NOTE — H&P (Signed)
Primary Care Physician:  Renee Rival, FNP Primary Gastroenterologist:  Dr. Abbey Chatters  Pre-Procedure History & Physical: HPI:  Isabella Hall is a 66 y.o. female is here for first ever colonoscopy for colon cancer screening purposes.  Patient notes family history of colon cancer in her mother (advanced age in 45s). No melena or hematochezia.  No abdominal pain or unintentional weight loss.  No change in bowel habits.  Overall feels well from a GI standpoint.  Past Medical History:  Diagnosis Date   Atrial fibrillation ([place])     CHADSVASC score 4   CKD (chronic kidney disease)    Essential hypertension    Hypothyroidism    Obesity    PONV (postoperative nausea and vomiting)    Type 2 diabetes, diet controlled ([place])    Typical atrial flutter ([place]) 09/2020    Past Surgical History:  Procedure Laterality Date   APPENDECTOMY  1979   ATRIAL FIBRILLATION ABLATION  03/26/2018   ATRIAL FIBRILLATION ABLATION N/A 03/26/2018   Procedure: ATRIAL FIBRILLATION ABLATION;  Surgeon: Thompson Grayer, MD;  Location: [place] [facility];  Service: Cardiovascular;  Laterality: N/A;   CARDIOVERSION N/A 01/25/2015   Procedure: CARDIOVERSION;  Surgeon: Satira Sark, MD;  Location: [facility];  Service: Cardiovascular;  Laterality: N/A;   CARDIOVERSION N/A 10/28/2020   Procedure: CARDIOVERSION;  Surgeon: Satira Sark, MD;  Location: [facility];  Service: Cardiovascular;  Laterality: N/A;   CARDIOVERSION N/A 06/10/2021   Procedure: CARDIOVERSION;  Surgeon: Skeet Latch, MD;  Location: [facility];  Service: Cardiovascular;  Laterality: N/A;   [place] ANGIOGRAM N/A 01/27/2015   Procedure: LEFT HEART CATHETERIZATION WITH CORONARY ANGIOGRAM;  Surgeon: Sinclair Grooms, MD;  Location: [facility] C[facility] LAB;  Service: Cardiovascular;  Laterality: N/A;   TEE WITHOUT CARDIOVERSION N/A 01/25/2015   Procedure: TRANSESOPHAGEAL ECHOCARDIOGRAM (TEE);   Surgeon: Satira Sark, MD;  Location: [facility];  Service: Cardiovascular;  Laterality: N/A;   [place]    Prior to Admission medications   Medication Sig Start Date End Date Taking? Authorizing Provider  amiodarone (PACERONE) 200 MG tablet TAKE 1 TABLET BY MOUTH ONCE DAILY --  APPOINTMENT  NEEDED  FOR  FURTHER  REFILLS  ([phone]) 12/20/21  Yes Sherran Needs, NP  atorvastatin (LIPITOR) 10 MG tablet Take 1 tablet (10 mg total) by mouth daily. 04/21/22  Yes Paseda, Dewaine Conger, FNP  blood glucose meter kit and supplies KIT Dispense based on patient and insurance preference. Use up to four times daily as directed. (FOR ICD-9 250.00, 250.01). 08/11/17  Yes Memon, Jolaine Artist, MD  Capsicum, Cayenne, (CAYENNE PO) Take 1 Scoop by mouth daily.    Yes [provider]  carvedilol (COREG) 25 MG tablet TAKE 1 TABLET BY MOUTH TWICE DAILY WITH A MEAL 12/05/21  Yes Sherran Needs, NP  cholecalciferol (VITAMIN D3) 25 MCG (1000 UNIT) tablet Take 1,000 Units by mouth daily.   Yes [provider]  CINNAMON PO Take 1 capsule by mouth daily.   Yes [provider]  dapagliflozin propanediol (FARXIGA) 5 MG TABS tablet Take 5 mg by mouth daily.   Yes [provider]  diltiazem (CARDIZEM CD) 240 MG 24 hr capsule Take 1 capsule by mouth once daily 12/20/21  Yes Allred, Jeneen Rinks, MD  GARLIC PO Take 1 tablet by mouth daily.   Yes [provider]  Ginger, Zingiber officinalis, (GINGER EXTRACT PO) Take 1 tablet by  mouth daily.    Yes [provider]  glucose blood (ACCU-CHEK AVIVA PLUS) test strip Check your blood glucose daily 11/15/20  Yes Ailene Ards, NP  levothyroxine (SYNTHROID) 150 MCG tablet Take 1 tablet by mouth once daily 06/13/22  Yes Paseda, Dewaine Conger, FNP  losartan (COZAAR) 50 MG tablet Take 1 tablet by mouth once daily 02/28/22  Yes Allred, Jeneen Rinks, MD  Multiple Vitamin (MULTIVITAMIN WITH MINERALS) TABS tablet Take 1 tablet by mouth daily.    Yes  [provider]  Multiple Vitamins-Minerals (EMERGEN-C IMMUNE PO) Take 1 tablet by mouth daily.    Yes [provider]  polyethylene glycol-electrolytes (TRILYTE) 420 g solution Take 4,000 mLs by mouth as directed. 03/31/22  Yes Sajjad Honea, Elon Alas, DO  rivaroxaban (XARELTO) 20 MG TABS tablet TAKE 1 TABLET BY MOUTH ONCE DAILY WITH SUPPER 04/06/22  Yes Allred, Jeneen Rinks, MD    Allergies as of 03/31/2022   (No Known Allergies)    Family History  Problem Relation Age of Onset   Cancer Mother    Colon cancer Mother        in 22s   Diabetes type II Mother    Heart disease Father    Coronary artery disease Sister        died of an MI in her 55's   Heart disease Sister        dies at age 36   Diabetes type II Sister    Kidney disease Sister    Cancer Maternal Grandmother        ?leukemia   Hypertension Other    Leukemia Nephew        62    Social History   Socioeconomic History   Marital status: Divorced    Spouse name: Not on file   Number of children: 2   Years of education: Not on file   Highest education level: Not on file  Occupational History   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   Social Determinants of Health   Financial Resource Strain: Not on file  Food Insecurity: Not on file  Transportation Needs: Not on file  Physical Activity: Not on file  Stress: Not on file  Social Connections: Not on file  Intimate Partner Violence: Not on file    Review of Systems: See HPI, otherwise negative  ROS  Physical Exam: Vital signs in last 24 hours: Temp:  [98 F (36.7 C)] 98 F (36.7 C) (06/30 0702) Pulse Rate:  [15] 15 (06/30 0702) Resp:  [15] 15 (06/30 0702) BP: (141)/(60) 141/60 (06/30 0702) SpO2:  [100 %] 100 % (06/30 0702)   General:   Alert,  Well-developed, well-nourished, pleasant and cooperative in NAD Head:  Normocephalic and atraumatic. Eyes:  Sclera clear, no icterus.   Conjunctiva pink. Ears:  Normal auditory acuity. Nose:  No deformity, discharge,  or lesions. Mouth:  No deformity or lesions, dentition normal. Neck:  Supple; no masses or thyromegaly. Lungs:  Clear throughout to  auscultation.   No wheezes, crackles, or rhonchi. No acute distress. Heart:  Regular rate and rhythm; no murmurs, clicks, rubs,  or gallops. Abdomen:  Soft, nontender and nondistended. No masses, hepatosplenomegaly or hernias noted. Normal bowel sounds, without guarding, and without rebound.   Msk:  Symmetrical without gross deformities. Normal posture. Extremities:  Without clubbing or edema. Neurologic:  Alert and  oriented x4;  grossly normal neurologically. Skin:  Intact without significant lesions or rashes. Cervical Nodes:  No significant cervical adenopathy. Psych:  Alert and cooperative. Normal mood and affect.  Impression/Plan: Isabella Hall is here for a colonoscopy to be performed for colon cancer screening purposes.  The risks of the procedure including infection, bleed, or perforation as well as benefits, limitations, alternatives and imponderables have been reviewed with the patient. Questions have been answered. All parties agreeable.

## 2022-06-16 NOTE — Anesthesia Preprocedure Evaluation (Signed)
Anesthesia Evaluation  Patient identified by MRN, date of birth, ID band Patient awake    Reviewed: Allergy & Precautions, H&P , NPO status , Patient's Chart, lab work & pertinent test results, reviewed documented beta blocker date and time   Airway Mallampati: II  TM Distance: >3 FB Neck ROM: full    Dental no notable dental hx.    Pulmonary neg pulmonary ROS,    Pulmonary exam normal breath sounds clear to auscultation       Cardiovascular Exercise Tolerance: Good hypertension, + dysrhythmias Atrial Fibrillation  Rhythm:regular Rate:Normal     Neuro/Psych negative neurological ROS  negative psych ROS   GI/Hepatic negative GI ROS, Neg liver ROS,   Endo/Other  diabetes, Type 2Hypothyroidism Morbid obesity  Renal/GU CRFRenal disease  negative genitourinary   Musculoskeletal   Abdominal   Peds  Hematology negative hematology ROS (+)   Anesthesia Other Findings   Reproductive/Obstetrics negative OB ROS                             Anesthesia Physical Anesthesia Plan  ASA: 3  Anesthesia Plan: General   Post-op Pain Management:    Induction:   PONV Risk Score and Plan: Propofol infusion  Airway Management Planned:   Additional Equipment:   Intra-op Plan:   Post-operative Plan:   Informed Consent: I have reviewed the patients History and Physical, chart, labs and discussed the procedure including the risks, benefits and alternatives for the proposed anesthesia with the patient or authorized representative who has indicated his/her understanding and acceptance.     Dental Advisory Given  Plan Discussed with: CRNA  Anesthesia Plan Comments:         Anesthesia Quick Evaluation

## 2022-06-16 NOTE — Op Note (Signed)
[facility] Patient Name: Isabella Hall Procedure Date: 06/16/2022 8:06 AM MRN: [id number] Date of Birth: 03-Mar-1956 Attending MD: Elon Alas. Abbey Chatters DO CSN: [id number] Age: 66 Admit Type: Outpatient Procedure:                Colonoscopy Indications:              Screening for colorectal malignant neoplasm Providers:                Elon Alas. Abbey Chatters, DO, Lurline Del, RN, Kristine L.                            Risa Grill, Technician, Everardo Pacific Referring MD:              Medicines:                See the Anesthesia note for documentation of the                            administered medications Complications:            No immediate complications. Estimated Blood Loss:     Estimated blood loss: none. Procedure:                Pre-Anesthesia Assessment:                           - The anesthesia plan was to use monitored                            anesthesia care (MAC).                           After obtaining informed consent, the colonoscope                            was passed under direct vision. Throughout the                            procedure, the patient's blood pressure, pulse, and                            oxygen saturations were monitored continuously. The                            [id number]-HQ190L ([id number]) scope was introduced through                            the anus and advanced to the the cecum, identified                            by appendiceal orifice and ileocecal valve. The                            colonoscopy was performed without difficulty. The                            patient tolerated the  procedure well. The quality                            of the bowel preparation was evaluated using the                            BBPS [facility] Bowel Preparation Scale) with scores                            of: Right Colon = 2 (minor amount of residual                            staining, small fragments of stool and/or opaque                             liquid, but mucosa seen well), Transverse Colon = 3                            (entire mucosa seen well with no residual staining,                            small fragments of stool or opaque liquid) and Left                            Colon = 3 (entire mucosa seen well with no residual                            staining, small fragments of stool or opaque                            liquid). The total BBPS score equals 8. The quality                            of the bowel preparation was good. Scope In: 8:18:13 AM Scope Out: 8:30:51 AM Scope Withdrawal Time: 0 hours 10 minutes 4 seconds  Total Procedure Duration: 0 hours 12 minutes 38 seconds  Findings:      The perianal and digital rectal examinations were normal.      Non-bleeding internal hemorrhoids were found during endoscopy.      Multiple small and large-mouthed diverticula were found in the sigmoid       colon, descending colon and transverse colon. Impression:               - Non-bleeding internal hemorrhoids.                           - Diverticulosis in the sigmoid colon, in the                            descending colon and in the transverse colon.                           - No specimens collected. Moderate Sedation:      Per Anesthesia Care Recommendation:           -  Patient has a contact number available for                            emergencies. The signs and symptoms of potential                            delayed complications were discussed with the                            patient. Return to normal activities tomorrow.                            Written discharge instructions were provided to the                            patient.                           - Resume previous diet.                           - Continue present medications.                           - Repeat colonoscopy in 10 years for screening                            purposes.                           - Return to GI clinic  PRN. Procedure Code(s):        --- Professional ---                           M3536, Colorectal cancer screening; colonoscopy on                            individual not meeting criteria for high risk Diagnosis Code(s):        --- Professional ---                           Z12.11, Encounter for screening for malignant                            neoplasm of colon                           K64.8, Other hemorrhoids                           K57.30, Diverticulosis of large intestine without                            perforation or abscess without bleeding CPT copyright 2019 American Medical Association. All rights reserved. The codes documented in this report are preliminary and upon coder review may  be revised to meet current compliance requirements. [name], DO Elon Alas. Abbey Chatters, DO 06/16/2022 8:32:47 AM This report has been signed electronically. Number of Addenda: 0

## 2022-06-17 NOTE — Anesthesia Postprocedure Evaluation (Signed)
Anesthesia Post Note  Patient: Isabella Hall  Procedure(s) Performed: COLONOSCOPY WITH PROPOFOL  Patient location during evaluation: Phase II Anesthesia Type: General Level of consciousness: awake Pain management: pain level controlled Vital Signs Assessment: post-procedure vital signs reviewed and stable Respiratory status: spontaneous breathing and respiratory function stable Cardiovascular status: blood pressure returned to baseline and stable Postop Assessment: no headache and no apparent nausea or vomiting Anesthetic complications: no Comments: Late entry   No notable events documented.   Last Vitals:  Vitals:   06/16/22 0702 06/16/22 0834  BP: (!) 141/60 (!) 143/87  Pulse: 75 (!) 55  Resp: 15 16  Temp: 36.7 C 36.6 C  SpO2: 100% 99%    Last Pain:  Vitals:   06/16/22 0834  TempSrc: Oral  PainSc: 0-No pain                 Louann Sjogren

## 2022-06-22 ENCOUNTER — Encounter (HOSPITAL_COMMUNITY): Payer: Self-pay | Admitting: Internal Medicine

## 2022-07-05 IMAGING — MG MM DIGITAL SCREENING BILAT W/ TOMO AND CAD
6 of 12 series · 6 of 36 positions shown · non-contrast
Comparison: None.

CLINICAL DATA: Screening.

EXAM:
DIGITAL SCREENING BILATERAL MAMMOGRAM WITH TOMOSYNTHESIS AND CAD
TECHNIQUE: Bilateral screening digital craniocaudal and mediolateral oblique
mammograms were obtained. Bilateral screening digital breast
tomosynthesis was performed. The images were evaluated with
computer-aided detection.

[R MLO synth-2D]
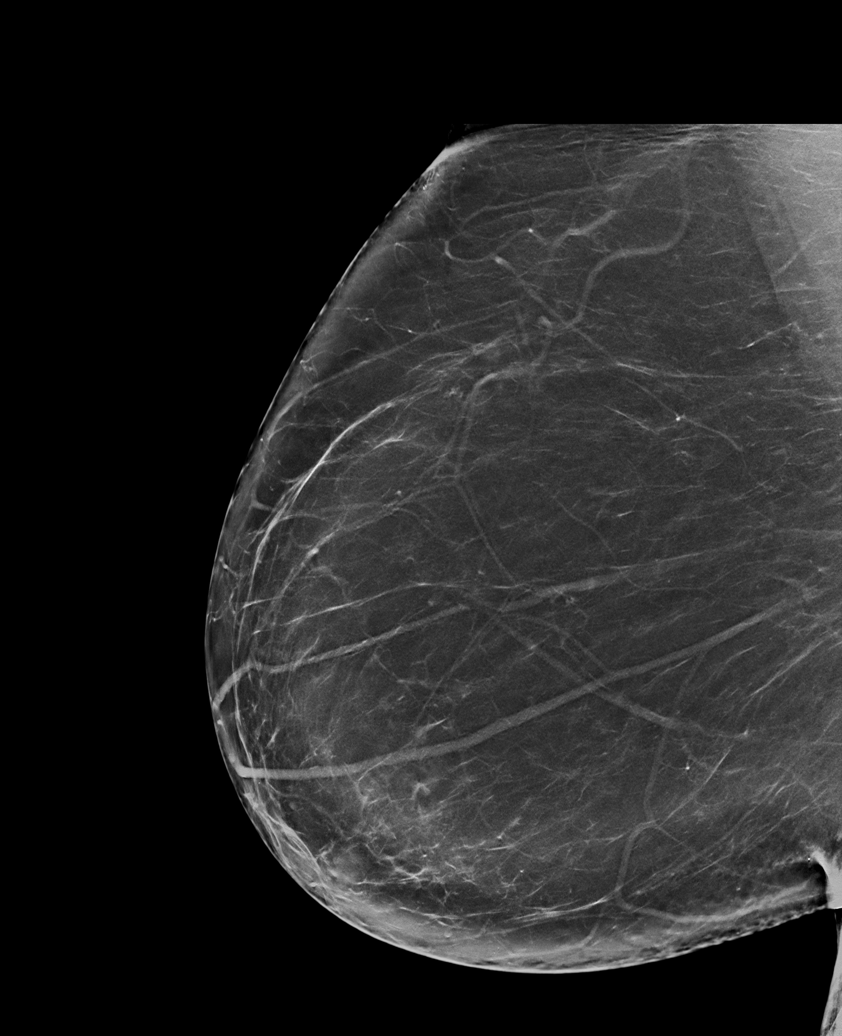

[R CC synth-2D (1 of 2)]
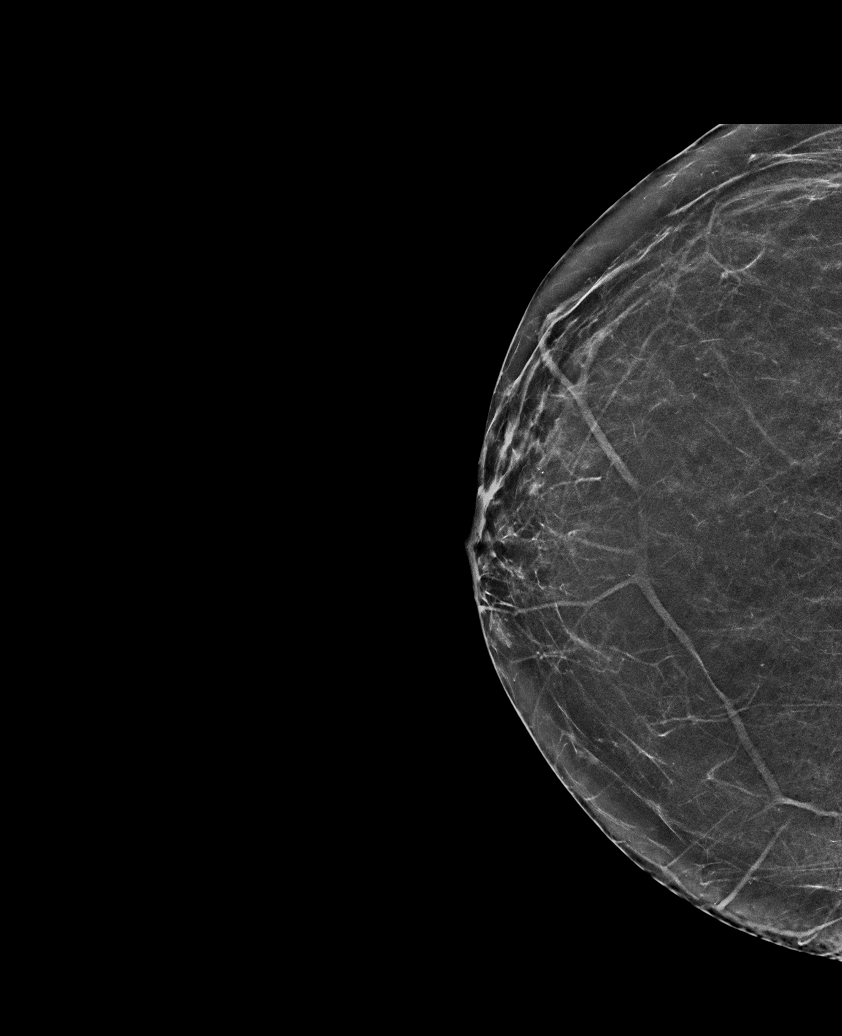

[L CC synth-2D]
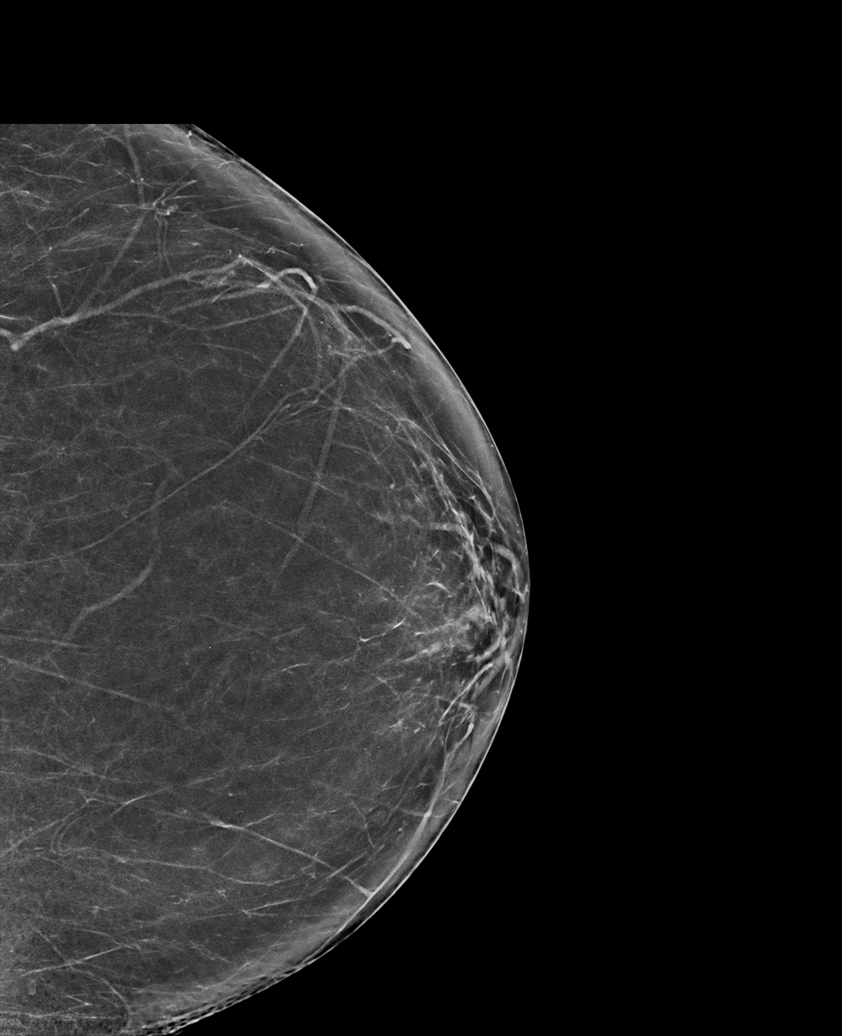

[R CC synth-2D (2 of 2)]
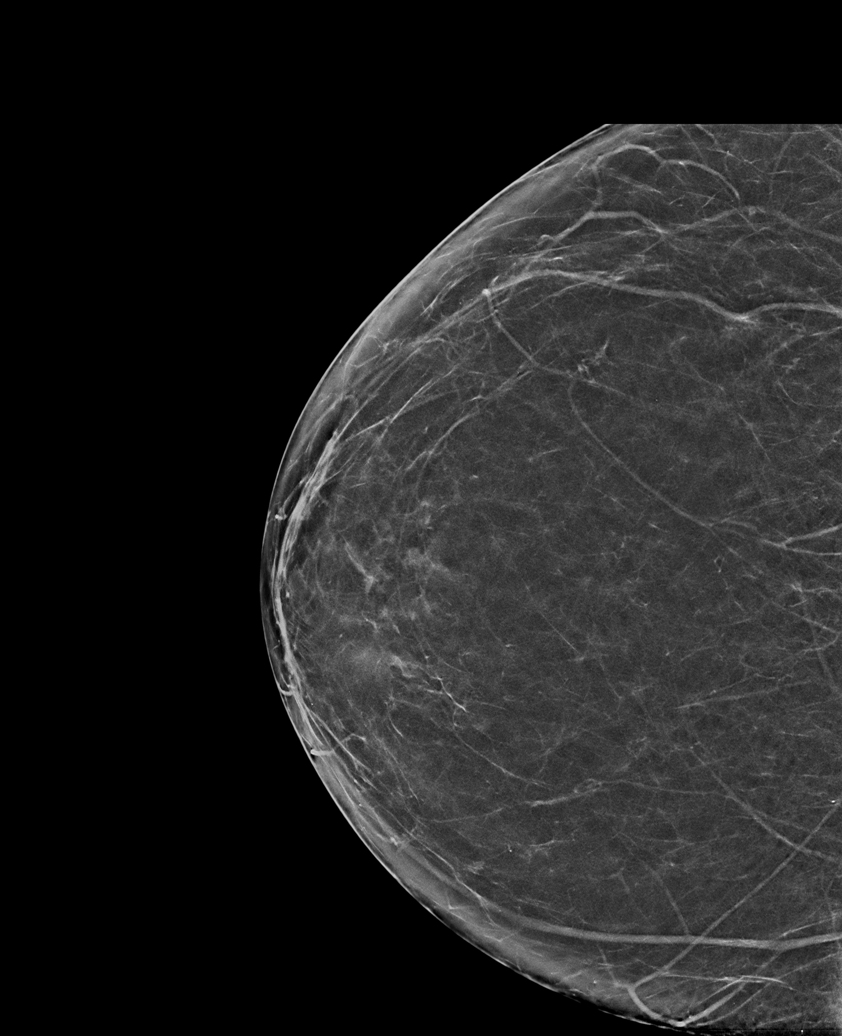

[L MLO synth-2D]
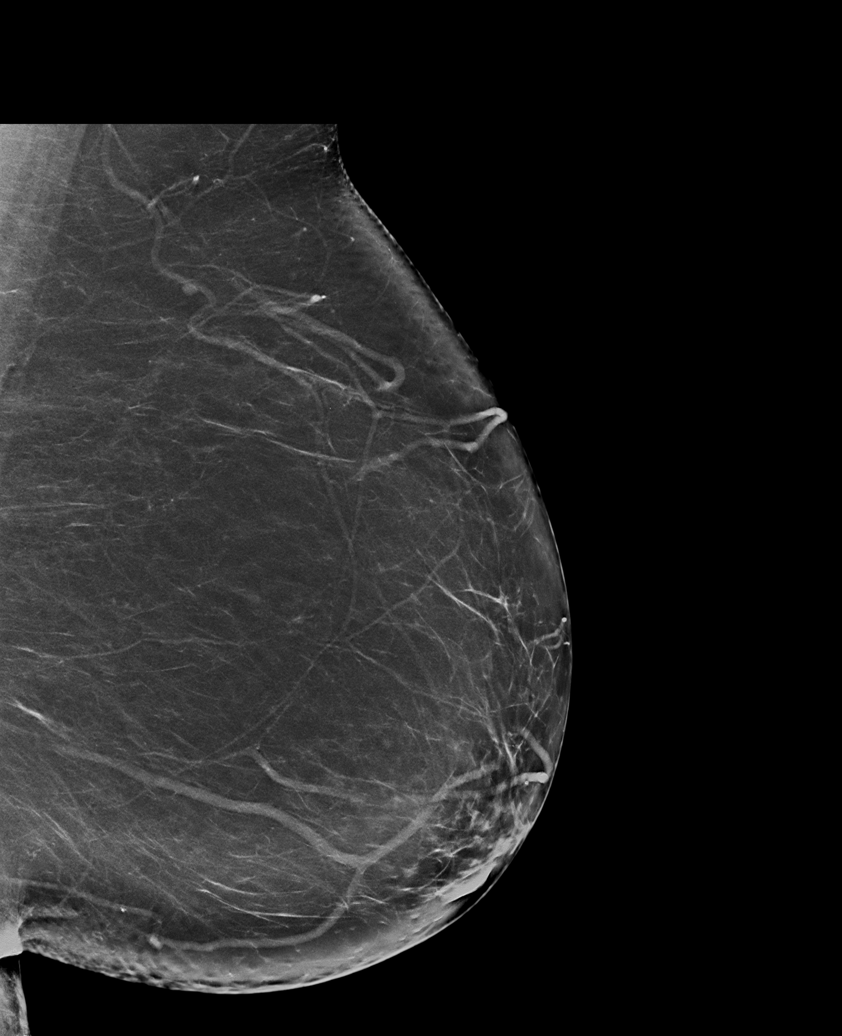

[R CV synth-2D]
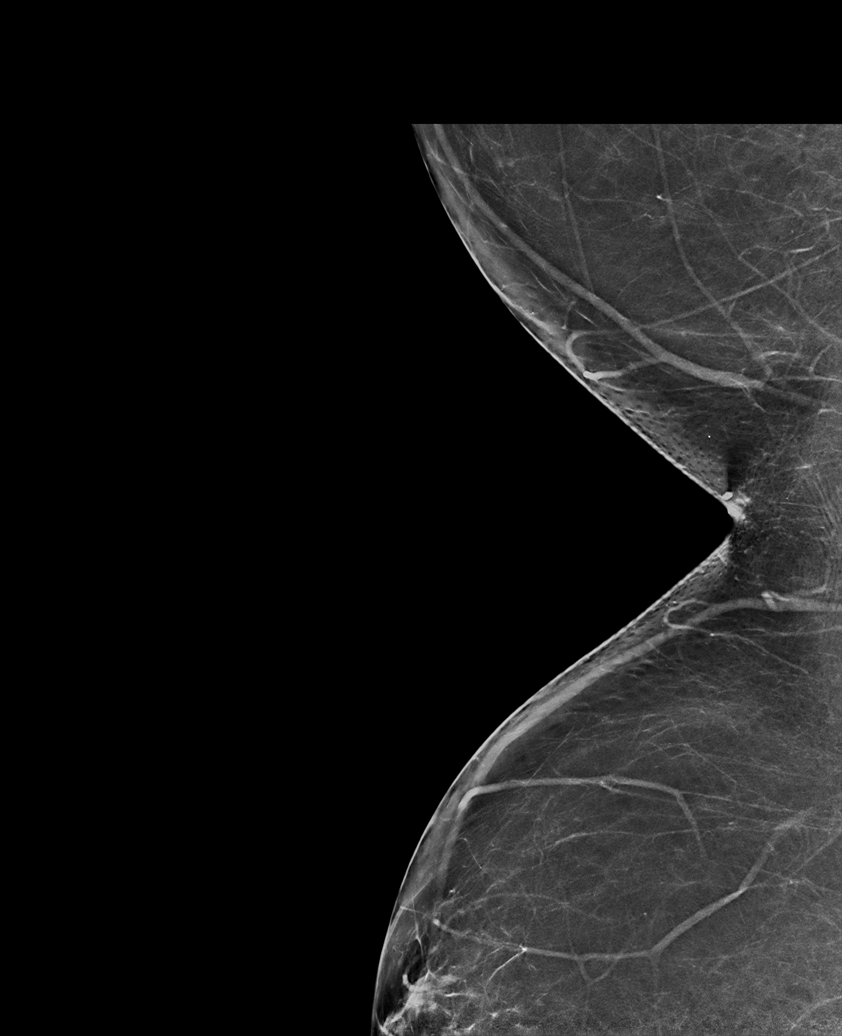

[6 of 36 positions shown; findings below may reference images not displayed]

ACR Breast Density Category b: There are scattered areas of
fibroglandular density.
FINDINGS: There are no findings suspicious for malignancy.
IMPRESSION: No mammographic evidence of malignancy. A result letter of this
screening mammogram will be mailed directly to the patient.

RECOMMENDATION:
Screening mammogram in one year. (Code:XG-X-X7B)

BI-RADS CATEGORY  1: Negative.

## 2022-07-11 ENCOUNTER — Other Ambulatory Visit: Payer: Self-pay | Admitting: Nurse Practitioner

## 2022-07-11 ENCOUNTER — Ambulatory Visit: Payer: 59

## 2022-07-11 DIAGNOSIS — E039 Hypothyroidism, unspecified: Secondary | ICD-10-CM

## 2022-08-04 ENCOUNTER — Other Ambulatory Visit: Payer: Self-pay | Admitting: Nurse Practitioner

## 2022-08-04 DIAGNOSIS — E039 Hypothyroidism, unspecified: Secondary | ICD-10-CM

## 2022-08-07 IMAGING — US US RENAL
1 series · 14 of 25 positions shown · non-contrast
Comparison: CT chest 01/30/2015.

CLINICAL DATA: Chronic renal disease.

EXAM:
RENAL / URINARY TRACT ULTRASOUND COMPLETE

[Series 1: us renal · 14 of 47 slices shown]
[im 1/47]
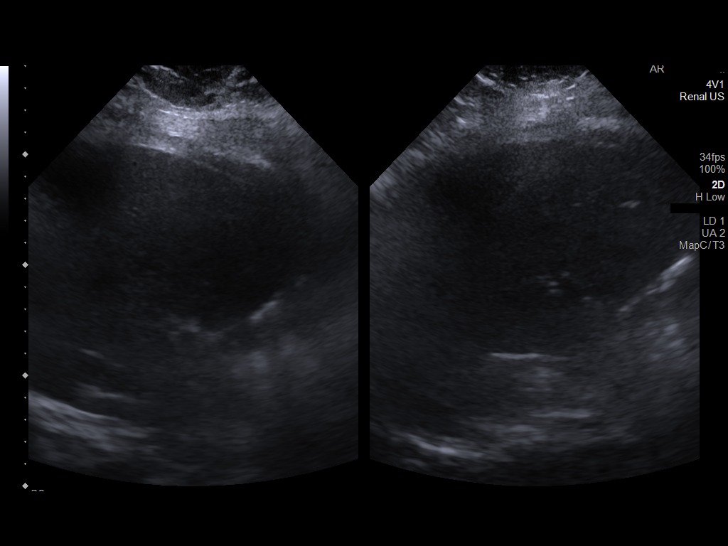
[im 4/47]
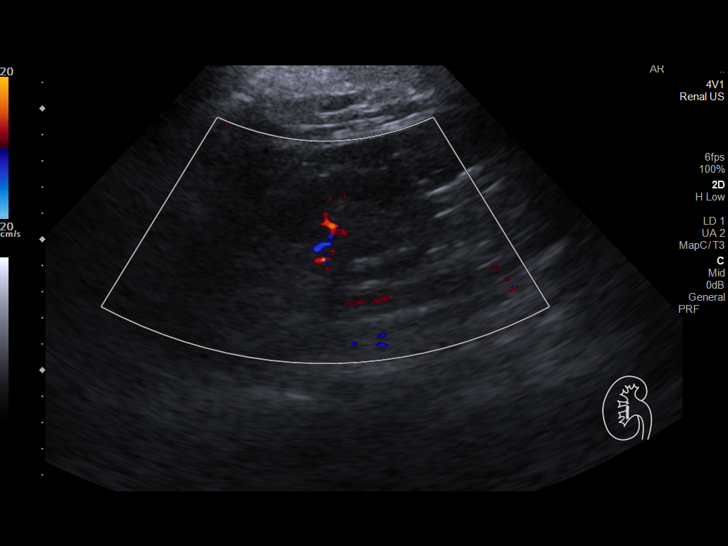
[im 8/47]
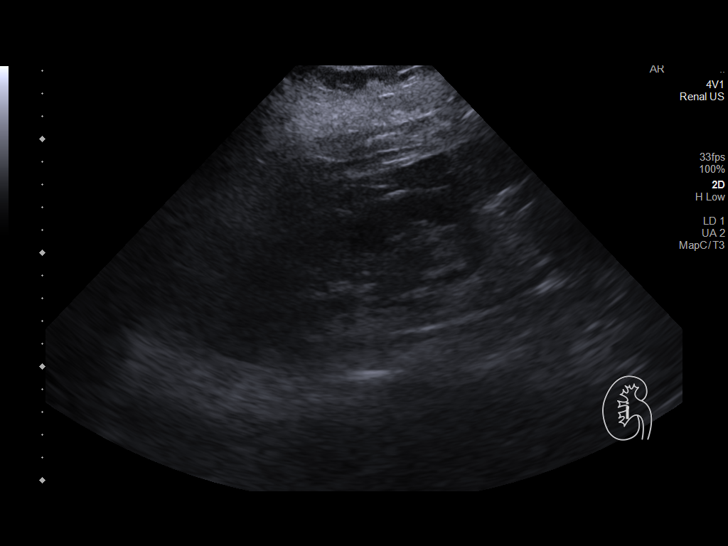
[im 12/47]
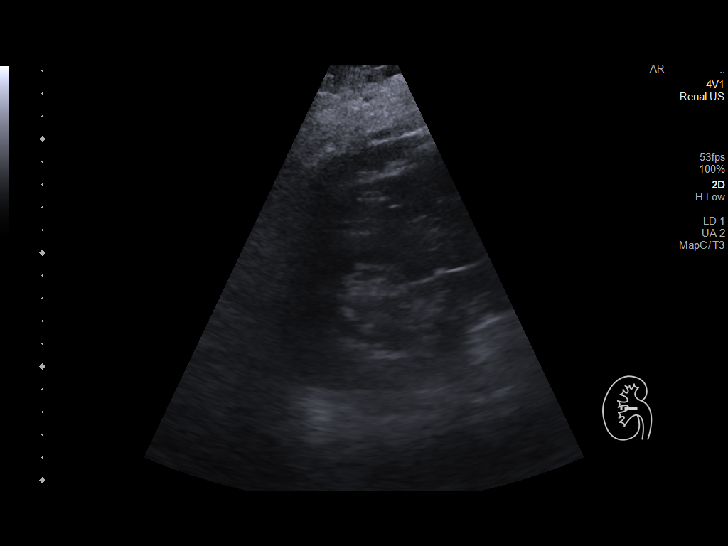
[im 16/47]
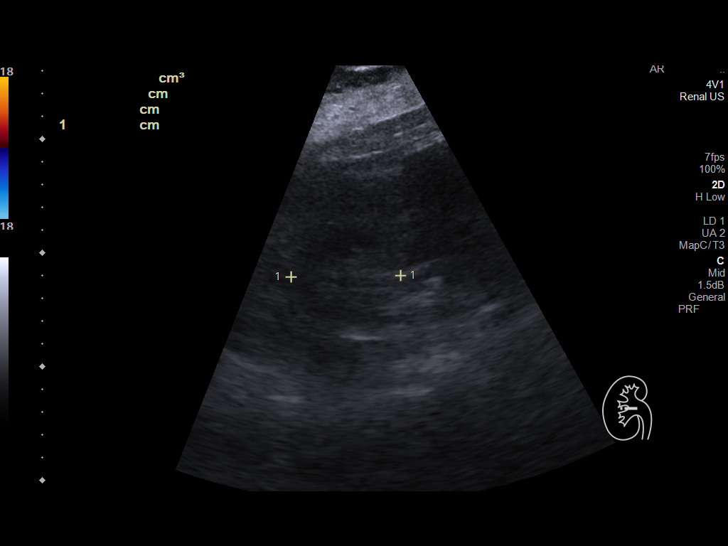
[im 18/47]
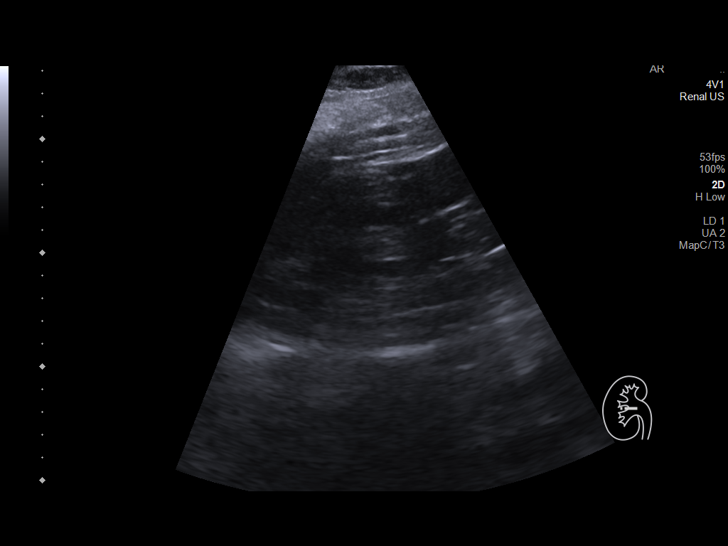
[im 22/47]
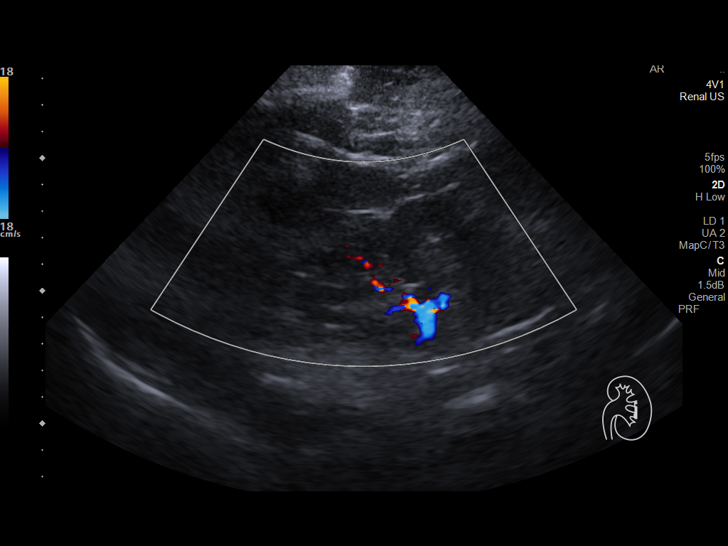
[im 25/47]
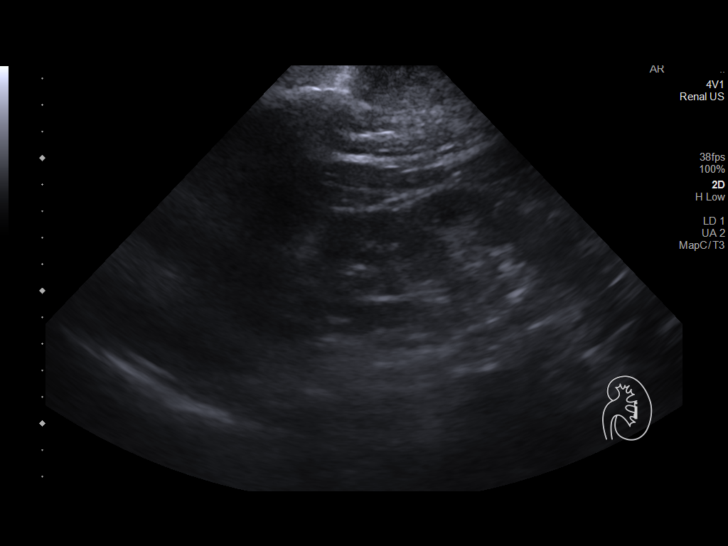
[im 29/47]
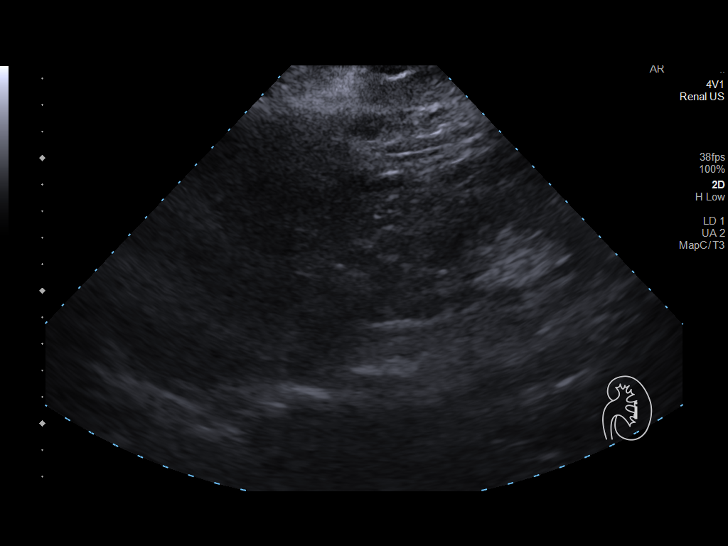
[im 31/47]
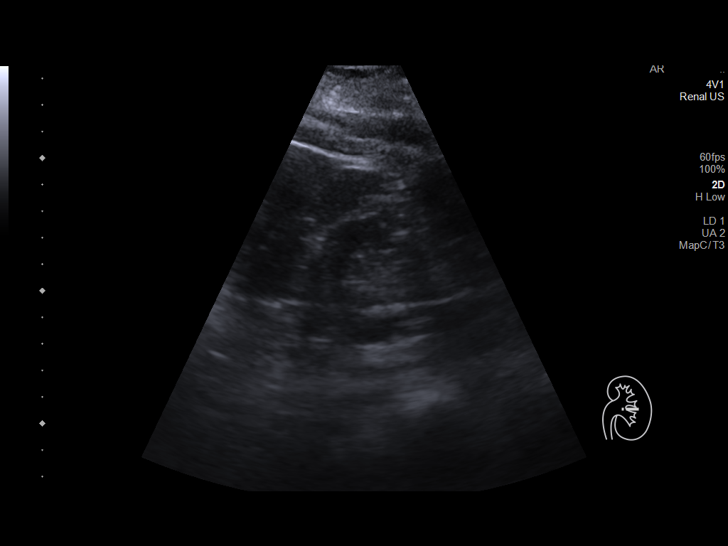
[im 35/47]
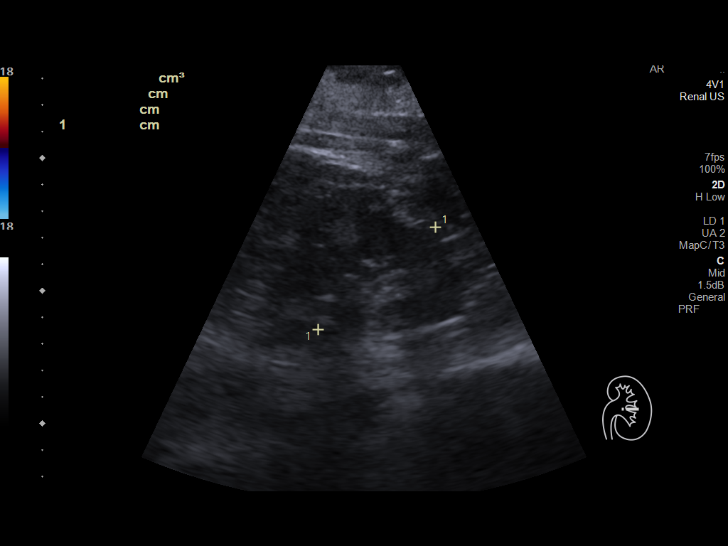
[im 39/47]
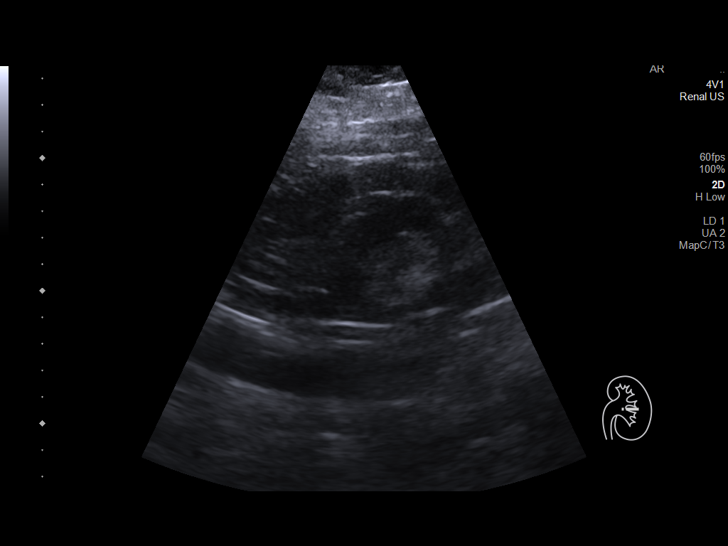
[im 43/47]
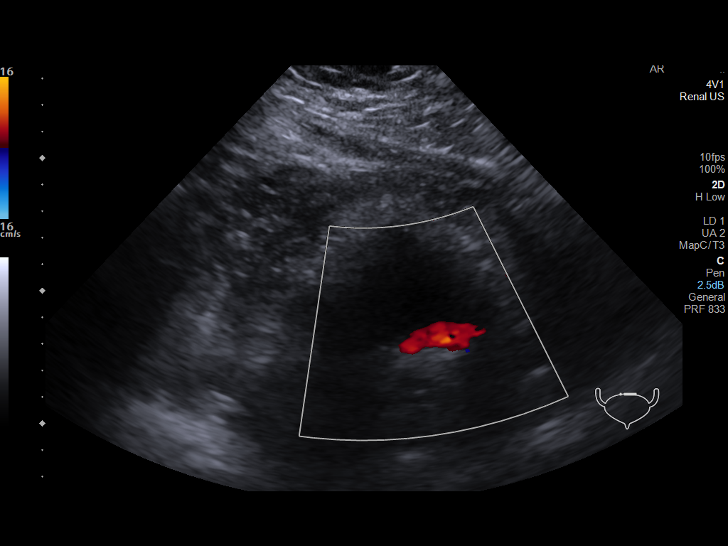
[im 47/47]
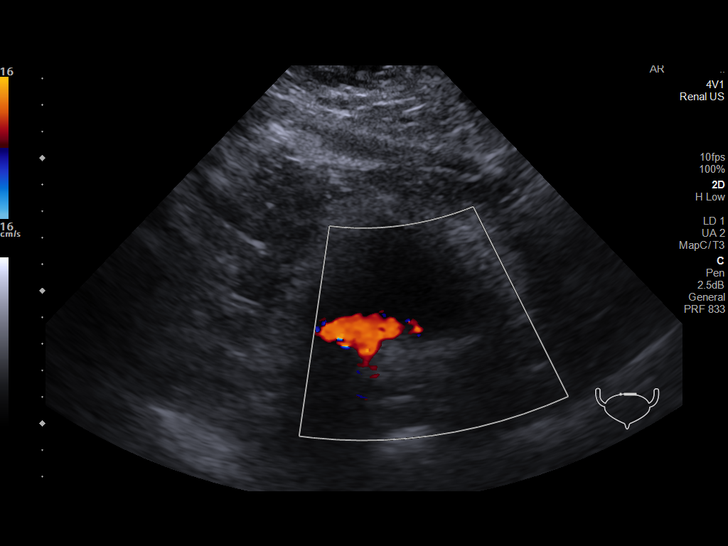

[14 of 25 positions shown; findings below may reference images not displayed]

FINDINGS: Right Kidney:

Renal measurements: 11.2 x 5.9 x 4.8 cm = volume: 169.3 mL.
Echogenicity within normal limits. Renal cortical thinning. Lobular
contour suggesting scarring. No mass or hydronephrosis visualized.

Left Kidney:

Renal measurements: 10.8 x 5.8 x 5.9 cm = volume: 191.4 mL.
Echogenicity within normal limits. Renal cortical thinning. Lobular
contour suggesting scarring. No mass or hydronephrosis visualized.

Bladder:

Appears normal for degree of bladder distention. Bilateral ureteral
jets visualized.

Other:

None.
IMPRESSION: 1. Bilateral renal cortical thinning. Bilateral lobular renal
contour suggesting scarring. Renal echogenicity is normal.

2. Exam is otherwise unremarkable. No mass or hydronephrosis. No
bladder distention.

## 2022-08-28 ENCOUNTER — Other Ambulatory Visit: Payer: Self-pay | Admitting: Internal Medicine

## 2022-09-04 ENCOUNTER — Other Ambulatory Visit: Payer: Self-pay | Admitting: Nurse Practitioner

## 2022-09-04 DIAGNOSIS — E039 Hypothyroidism, unspecified: Secondary | ICD-10-CM

## 2022-09-05 ENCOUNTER — Encounter (HOSPITAL_COMMUNITY): Payer: Self-pay | Admitting: Nurse Practitioner

## 2022-09-05 ENCOUNTER — Ambulatory Visit (HOSPITAL_COMMUNITY)
Admission: RE | Admit: 2022-09-05 | Discharge: 2022-09-05 | Disposition: A | Discharge status: Home / Self Care | Payer: 59 | Source: Ambulatory Visit | Attending: Nurse Practitioner | Admitting: Nurse Practitioner

## 2022-09-05 VITALS — BP 126/86 | HR 129 | Ht 66.0 in | Wt 338.0 lb

## 2022-09-05 DIAGNOSIS — I4891 Unspecified atrial fibrillation: Secondary | ICD-10-CM | POA: Insufficient documentation

## 2022-09-05 DIAGNOSIS — E669 Obesity, unspecified: Secondary | ICD-10-CM | POA: Insufficient documentation

## 2022-09-05 DIAGNOSIS — I129 Hypertensive chronic kidney disease with stage 1 through stage 4 chronic kidney disease, or unspecified chronic kidney disease: Secondary | ICD-10-CM | POA: Insufficient documentation

## 2022-09-05 DIAGNOSIS — I483 Typical atrial flutter: Secondary | ICD-10-CM | POA: Insufficient documentation

## 2022-09-05 DIAGNOSIS — N189 Chronic kidney disease, unspecified: Secondary | ICD-10-CM | POA: Insufficient documentation

## 2022-09-05 DIAGNOSIS — I4819 Other persistent atrial fibrillation: Secondary | ICD-10-CM

## 2022-09-05 DIAGNOSIS — Z6841 Body Mass Index (BMI) 40.0 and over, adult: Secondary | ICD-10-CM | POA: Diagnosis not present

## 2022-09-05 DIAGNOSIS — E1122 Type 2 diabetes mellitus with diabetic chronic kidney disease: Secondary | ICD-10-CM | POA: Insufficient documentation

## 2022-09-05 DIAGNOSIS — Z7901 Long term (current) use of anticoagulants: Secondary | ICD-10-CM | POA: Diagnosis not present

## 2022-09-05 DIAGNOSIS — D6869 Other thrombophilia: Secondary | ICD-10-CM

## 2022-09-05 DIAGNOSIS — I484 Atypical atrial flutter: Secondary | ICD-10-CM | POA: Insufficient documentation

## 2022-09-05 LAB — COMPREHENSIVE METABOLIC PANEL
ALT: 22 U/L (ref 0–44)
AST: 27 U/L (ref 15–41)
Albumin: 2.7 g/dL — ABNORMAL LOW (ref 3.5–5.0)
Alkaline Phosphatase: 41 U/L (ref 38–126)
Anion gap: 11 (ref 5–15)
BUN: 18 mg/dL (ref 8–23)
CO2: 27 mmol/L (ref 22–32)
Calcium: 9.3 mg/dL (ref 8.9–10.3)
Chloride: 100 mmol/L (ref 98–111)
Creatinine, Ser: 1.95 mg/dL — ABNORMAL HIGH (ref 0.44–1.00)
GFR, Estimated: 28 mL/min — ABNORMAL LOW (ref >60)
Glucose, Bld: 181 mg/dL — ABNORMAL HIGH (ref 70–99)
Potassium: 4.1 mmol/L (ref 3.5–5.1)
Sodium: 138 mmol/L (ref 135–145)
Total Bilirubin: 1 mg/dL (ref 0.3–1.2)
Total Protein: 7 g/dL (ref 6.5–8.1)

## 2022-09-05 LAB — CBC
HCT: 37.8 % (ref 36.0–46.0)
Hemoglobin: 12.2 g/dL (ref 12.0–15.0)
MCH: 28 pg (ref 26.0–34.0)
MCHC: 32.3 g/dL (ref 30.0–36.0)
MCV: 86.7 fL (ref 80.0–100.0)
Platelets: 257 10*3/uL (ref 150–400)
RBC: 4.36 MIL/uL (ref 3.87–5.11)
RDW: 14.5 % (ref 11.5–15.5)
WBC: 8.6 10*3/uL (ref 4.0–10.5)
nRBC: 0 % (ref 0.0–0.2)

## 2022-09-05 LAB — TSH: TSH: 18.255 u[IU]/mL — ABNORMAL HIGH (ref 0.350–4.500)

## 2022-09-05 MED ORDER — AMIODARONE HCL 200 MG PO TABS: ORAL_TABLET | ORAL | 6 refills | Status: DC

## 2022-09-05 NOTE — Progress Notes (Signed)
Primary Care Physician: Renee Rival, FNP Referring Physician: Dr. Serita Kyle Isabella Hall is a 66 y.o. female with a h/o  Afib, s/p ablation in 2019, CHF, HTN, Mod MR, NICM, DMT2 that is in the afib clinic for f/u recent cardioversion. She  saw Dr. [name]   in the [facility]  clinic 11/5 for typical atrial flutter and she had a successful cardioversion. Ekg now shows atypical atrial flutter vrs coarse afib. She felt improved after CV for around 3 days and then started feeling tired  again. Her v rate is 120 bpm today. Her cardizem was reduced from her usual dose of 240 mg bid to 240 mg daily at time of CV. She had lost down to 260 lb, thru diet and walking several days a week, but got "lazy",  stopped walking, diet slipped  and her weight has now  increased to over 300 lbs again.   F/u in afib clinic for tikosyn admit, 11/30/20. No missed xarelto doses x at least 3 weeks, no benadryl use. Plans to get drug thru good rx. Qtc today at 463 ms, atrial flutter with variable block at 103 bpm. In rhythm, I have seen qt at 440 ms .  F/u tikosyn admission,12/08/20.  Unfortunately,  her qr prolonged so tikosyn was stopped. She also had a nosebleed in the hospital that was difficult to control but finally resolved with no further issues. She  is in SR today but d/c instructions say to start amio 200 mg bid today. Her qtc is 418 ms today. She feels better and has gone back to walking and has lost 7 lbs.   Pt is here for f/u, 12/14/20, after being on amiodarone 200 mg bid, started last week. She failed Tikosyn admit for prolonged qt. EKG shows sinus brady at 59 bpm, qt stable at 451 ms and has lost several more pounds as she now feels like walking for exercise. She is tolerating amio well.   F/u 09/05/22. She had been doing well until she developed bronchitis 2 weeks ago. At that time, she went into afib and she increased her amiodarone from  100 mg daily to 200 mg bid. She has RVR today. She feels her  bronchitis is improving. We discussed pursing CV in another week to allow her to fully recover from bronchitis and she is in agreement. She states she covid tested and it was negative. She would prefer it to be at [facility]. She has gained 18 lbs since last visssit here but pt does not feel it is edema. No pedal edema seen.   Today, she denies symptoms of palpitations, chest pain, shortness of breath, orthopnea, PND, lower extremity edema, dizziness, presyncope, syncope, or neurologic sequela. The patient is tolerating medications without difficulties and is otherwise without complaint today.   Past Medical History:  Diagnosis Date   Atrial fibrillation ([place])     CHADSVASC score 4   CKD (chronic kidney disease)    Essential hypertension    Hypothyroidism    Obesity    PONV (postoperative nausea and vomiting)    Type 2 diabetes, diet controlled ([place])    Typical atrial flutter ([place]) 09/2020   Past Surgical History:  Procedure Laterality Date   APPENDECTOMY  1979   ATRIAL FIBRILLATION ABLATION  03/26/2018   ATRIAL FIBRILLATION ABLATION N/A 03/26/2018   Procedure: ATRIAL FIBRILLATION ABLATION;  Surgeon: Thompson Grayer, MD;  Location: [place] [facility];  Service: Cardiovascular;  Laterality: N/A;   CARDIOVERSION N/A  01/25/2015   Procedure: CARDIOVERSION;  Surgeon: Samuel G McDowell, MD;  Location: [facility];  Service: Cardiovascular;  Laterality: N/A;   CARDIOVERSION N/A 10/28/2020   Procedure: CARDIOVERSION;  Surgeon: McDowell, Samuel G, MD;  Location: [facility];  Service: Cardiovascular;  Laterality: N/A;   CARDIOVERSION N/A 06/10/2021   Procedure: CARDIOVERSION;  Surgeon: Kirvin, Tiffany, MD;  Location: [facility];  Service: Cardiovascular;  Laterality: N/A;   CHOLECYSTECTOMY OPEN  1979   COLONOSCOPY WITH PROPOFOL N/A 06/16/2022   Procedure: COLONOSCOPY WITH PROPOFOL;  Surgeon: Carver, Charles K, DO;  Location: [facility];  Service: Endoscopy;  Laterality: N/A;  3:00pm   LEFT HEART  CATHETERIZATION WITH CORONARY ANGIOGRAM N/A 01/27/2015   Procedure: LEFT HEART CATHETERIZATION WITH CORONARY ANGIOGRAM;  Surgeon: Henry W Smith III, MD;  Location: [facility] LAB;  Service: Cardiovascular;  Laterality: N/A;   TEE WITHOUT CARDIOVERSION N/A 01/25/2015   Procedure: TRANSESOPHAGEAL ECHOCARDIOGRAM (TEE);  Surgeon: Samuel G McDowell, MD;  Location: [facility];  Service: Cardiovascular;  Laterality: N/A;   TUBAL LIGATION  1980    Current Outpatient Medications  Medication Sig Dispense Refill   amiodarone (PACERONE) 200 MG tablet TAKE 1 TABLET BY MOUTH ONCE DAILY --  APPOINTMENT  NEEDED  FOR  FURTHER  REFILLS  ([phone]) (Patient taking differently: Take 200 mg by mouth 2 (two) times daily.) 30 tablet 6   atorvastatin (LIPITOR) 10 MG tablet Take 1 tablet (10 mg total) by mouth daily. 90 tablet 3   blood glucose meter kit and supplies KIT Dispense based on patient and insurance preference. Use up to four times daily as directed. (FOR ICD-9 250.00, 250.01). 1 each 0   Capsicum, Cayenne, (CAYENNE PO) Take 1 Scoop by mouth daily.      carvedilol (COREG) 25 MG tablet TAKE 1 TABLET BY MOUTH TWICE DAILY WITH A MEAL 180 tablet 3   cholecalciferol (VITAMIN D3) 25 MCG (1000 UNIT) tablet Take 1,000 Units by mouth daily.     CINNAMON PO Take 1 capsule by mouth daily.     dapagliflozin propanediol (FARXIGA) 5 MG TABS tablet Take 5 mg by mouth daily.     diltiazem (CARDIZEM CD) 240 MG 24 hr capsule Take 1 capsule by mouth once daily 90 capsule 3   GARLIC PO Take 1 tablet by mouth daily.     Ginger, Zingiber officinalis, (GINGER EXTRACT PO) Take 1 tablet by mouth daily.      glucose blood (ACCU-CHEK AVIVA PLUS) test strip Check your blood glucose daily 100 each 12   levothyroxine (SYNTHROID) 150 MCG tablet Take 1 tablet by mouth once daily 30 tablet 0   losartan (COZAAR) 50 MG tablet Take 1 tablet by mouth once daily 90 tablet 1   Multiple Vitamin (MULTIVITAMIN WITH MINERALS) TABS tablet Take 1 tablet by  mouth daily.      Multiple Vitamins-Minerals (EMERGEN-C IMMUNE PO) Take 1 tablet by mouth daily.      rivaroxaban (XARELTO) 20 MG TABS tablet TAKE 1 TABLET BY MOUTH ONCE DAILY WITH SUPPER 30 tablet 5   No current facility-administered medications for this encounter.    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   Social Determinants of Health   Financial Resource Strain: Not on file  Food Insecurity: Not on file  Transportation Needs: Not on file  Physical Activity: Not on file  Stress: Not on file  Social Connections: Not on file  Intimate Partner Violence: Not on file    Family History  Problem Relation Age of Onset   Cancer Mother    Colon cancer Mother        in 26s   Diabetes type II Mother    Heart disease Father    Coronary artery disease Sister        died of an MI in her 35's   Heart disease Sister        dies at age 10   Diabetes type II Sister    Kidney disease Sister    Cancer Maternal Grandmother        ?leukemia   Hypertension Other    Leukemia Nephew        30    ROS- All systems are reviewed and negative except as per the HPI above  Physical Exam: Vitals:   09/05/22 1506  BP: 126/86  Pulse: (!) 129  Weight: (!) 153.3 kg  Height: _0  (1.676 m)   Wt Readings  from Last 3 Encounters:  09/05/22 (!) 153.3 kg  06/13/22 (!) 145.2 kg  04/21/22 (!) 158.8 kg    Labs: Lab Results  Component Value Date   NA 138 06/13/2022   K 4.5 06/13/2022   CL 100 06/13/2022   CO2 27 06/13/2022   GLUCOSE 250 (H) 06/13/2022   BUN 37 (H) 06/13/2022   CREATININE 2.24 (H) 06/13/2022   CALCIUM 9.2 06/13/2022   MG 2.3 12/03/2020   Lab Results  Component Value Date   INR 1.6 (H) 10/26/2020   Lab Results  Component Value Date   CHOL 231 (H) 04/06/2022   HDL 60 04/06/2022   LDLCALC 149 (H) 04/06/2022   TRIG 106 04/06/2022     GEN- The patient is well appearing, alert and oriented x 3 today.   Head- normocephalic, atraumatic Eyes-  Sclera clear, conjunctiva pink Ears- hearing intact Oropharynx- clear Neck- supple, no JVP Lymph- no cervical lymphadenopathy Lungs- Clear to ausculation bilaterally, normal work of breathing Heart- regular rate and rhythm, no murmurs, rubs or gallops, PMI not laterally displaced GI- soft, NT, ND, + BS Extremities- no clubbing, cyanosis, or edema MS- no significant deformity or atrophy Skin- no rash or lesion Psych- euthymic mood, full affect Neuro- strength and sensation are intact  EKG- Vent. rate 129 BPM PR interval * ms QRS duration 94 ms QT/QTcB 322/471 ms P-R-T axes 91 56 100 Atrial flutter with 2:1 A-V conduction Nonspecific ST and T wave abnormality Abnormal ECG When compared with ECG of 10-Jun-2021 10:37, PREVIOUS ECG IS PRESENT  Echo- 12/01/20-1. Left ventricular ejection fraction, by estimation, is 45 to 50%. The left ventricle has mildly decreased function. The left ventricle demonstrates global hypokinesis. The left ventricular internal cavity size was moderately dilated. Left ventricular diastolic parameters were  normal. 2. Right ventricular systolic function is normal. The right ventricular size is normal. There is normal pulmonary artery systolic pressure. The estimated right ventricular systolic  pressure is 8.8 mmHg. 3. The mitral valve is normal in structure. Mild to moderate mitral valve regurgitation. No evidence of mitral stenosis. 4. The aortic valve is normal in structure. Aortic valve regurgitation is not visualized. No aortic stenosis is present. 5. The inferior vena cava is normal in size with greater than 50% respiratory variability, suggesting right atrial pressure of 3 mmHg.  Assessment and Plan: 1. H/o of afib, typical and atypical  atrial flutter  Failed tikoyn with prolonged qt   She was started on amiodarone. She had gotten down to 100 mg daily maintaining  SR but with recent bronchitis went into afib with RVR She increased her amiodarone to 200 mg bid  Will will plan on cardioversion in around another week so she can fully recover from bronchitis.  She feels improved but still coughing some  Continue on  Carvedilol 25 mg bid, CCB 240 mg  1x a day Cbc/bmet    2. CHA2DS2VASc score of 4 Continue xarelto 20 mg daily   3. BMI of 49.23 Weight loss encouraged  She has lost  10-12 lbs by being  back to walking daily   4. HTN Stable    afib clinic one week after cardioversion   Butch Penny C. Jarone Ostergaard, [facility] [address] [place], [place] [address] [phone]

## 2022-09-05 NOTE — Patient Instructions (Addendum)
Day of cardioversion return to normal dosing of amiodarone  Cardioversion scheduled for  - Arrive at the Main Entrance and go to admitting at  - Do not eat or drink anything after midnight the night prior to your procedure.  - Take all your morning medication (except diabetic medications) with a sip of water prior to arrival.  - You will not be able to drive home after your procedure.  - Do NOT miss any doses of your blood thinner - if you should miss a dose please notify our office immediately.  - If you feel as if you go back into normal rhythm prior to scheduled cardioversion, please notify our office immediately. If your procedure is canceled in the cardioversion suite you will be charged a cancellation fee.

## 2022-09-05 NOTE — H&P (View-Only) (Signed)
Primary Care Physician: Renee Rival, FNP Referring Physician: Dr. Serita Kyle Isabella KEESEY is a 66 y.o. female with a h/o  Afib, s/p ablation in 2019, CHF, HTN, Mod MR, NICM, DMT2 that is in the afib clinic for f/u recent cardioversion. She  saw Dr. [name]   in the [facility]  clinic 11/5 for typical atrial flutter and she had a successful cardioversion. Ekg now shows atypical atrial flutter vrs coarse afib. She felt improved after CV for around 3 days and then started feeling tired  again. Her v rate is 120 bpm today. Her cardizem was reduced from her usual dose of 240 mg bid to 240 mg daily at time of CV. She had lost down to 260 lb, thru diet and walking several days a week, but got "lazy",  stopped walking, diet slipped  and her weight has now  increased to over 300 lbs again.   F/u in afib clinic for tikosyn admit, 11/30/20. No missed xarelto doses x at least 3 weeks, no benadryl use. Plans to get drug thru good rx. Qtc today at 463 ms, atrial flutter with variable block at 103 bpm. In rhythm, I have seen qt at 440 ms .  F/u tikosyn admission,12/08/20.  Unfortunately,  her qr prolonged so tikosyn was stopped. She also had a nosebleed in the hospital that was difficult to control but finally resolved with no further issues. She  is in SR today but d/c instructions say to start amio 200 mg bid today. Her qtc is 418 ms today. She feels better and has gone back to walking and has lost 7 lbs.   Pt is here for f/u, 12/14/20, after being on amiodarone 200 mg bid, started last week. She failed Tikosyn admit for prolonged qt. EKG shows sinus brady at 59 bpm, qt stable at 451 ms and has lost several more pounds as she now feels like walking for exercise. She is tolerating amio well.   F/u 09/05/22. She had been doing well until she developed bronchitis 2 weeks ago. At that time, she went into afib and she increased her amiodarone from  100 mg daily to 200 mg bid. She has RVR today. She feels her  bronchitis is improving. We discussed pursing CV in another week to allow her to fully recover from bronchitis and she is in agreement. She states she covid tested and it was negative. She would prefer it to be at [facility]. She has gained 18 lbs since last visssit here but pt does not feel it is edema. No pedal edema seen.   Today, she denies symptoms of palpitations, chest pain, shortness of breath, orthopnea, PND, lower extremity edema, dizziness, presyncope, syncope, or neurologic sequela. The patient is tolerating medications without difficulties and is otherwise without complaint today.   Past Medical History:  Diagnosis Date   Atrial fibrillation ([place])     CHADSVASC score 4   CKD (chronic kidney disease)    Essential hypertension    Hypothyroidism    Obesity    PONV (postoperative nausea and vomiting)    Type 2 diabetes, diet controlled ([place])    Typical atrial flutter ([place]) 09/2020   Past Surgical History:  Procedure Laterality Date   APPENDECTOMY  1979   ATRIAL FIBRILLATION ABLATION  03/26/2018   ATRIAL FIBRILLATION ABLATION N/A 03/26/2018   Procedure: ATRIAL FIBRILLATION ABLATION;  Surgeon: Thompson Grayer, MD;  Location: [place] [facility];  Service: Cardiovascular;  Laterality: N/A;   CARDIOVERSION N/A  01/25/2015   Procedure: CARDIOVERSION;  Surgeon: Satira Sark, MD;  Location: [facility];  Service: Cardiovascular;  Laterality: N/A;   CARDIOVERSION N/A 10/28/2020   Procedure: CARDIOVERSION;  Surgeon: Satira Sark, MD;  [facility];  Service: Cardiovascular;  Laterality: N/A;   CARDIOVERSION N/A 06/10/2021   Procedure: CARDIOVERSION;  Surgeon: Skeet Latch, MD;  Location: [place];  Service: Cardiovascular;  Laterality: N/A;   CHOLECYSTECTOMY OPEN  1979   COLONOSCOPY WITH PROPOFOL N/A 06/16/2022   Procedure: COLONOSCOPY WITH PROPOFOL;  Surgeon: Eloise Harman, DO;  Location: [facility];  Service: Endoscopy;  Laterality: N/A;  3:00pm   LEFT HEART  CATHETERIZATION WITH CORONARY ANGIOGRAM N/A 01/27/2015   Procedure: LEFT HEART CATHETERIZATION WITH CORONARY ANGIOGRAM;  Surgeon: Sinclair Grooms, MD;  Location: [facility] CATH LAB;  Service: Cardiovascular;  Laterality: N/A;   TEE WITHOUT CARDIOVERSION N/A 01/25/2015   Procedure: TRANSESOPHAGEAL ECHOCARDIOGRAM (TEE);  Surgeon: Satira Sark, MD;  Location: [facility];  Service: Cardiovascular;  Laterality: N/A;   TUBAL LIGATION  1980    Current Outpatient Medications  Medication Sig Dispense Refill   amiodarone (PACERONE) 200 MG tablet TAKE 1 TABLET BY MOUTH ONCE DAILY --  APPOINTMENT  NEEDED  FOR  FURTHER  REFILLS  ([phone]) (Patient taking differently: Take 200 mg by mouth 2 (two) times daily.) 30 tablet 6   atorvastatin (LIPITOR) 10 MG tablet Take 1 tablet (10 mg total) by mouth daily. 90 tablet 3   blood glucose meter kit and supplies KIT Dispense based on patient and insurance preference. Use up to four times daily as directed. (FOR ICD-9 250.00, 250.01). 1 each 0   Capsicum, Cayenne, (CAYENNE PO) Take 1 Scoop by mouth daily.      carvedilol (COREG) 25 MG tablet TAKE 1 TABLET BY MOUTH TWICE DAILY WITH A MEAL 180 tablet 3   cholecalciferol (VITAMIN D3) 25 MCG (1000 UNIT) tablet Take 1,000 Units by mouth daily.     CINNAMON PO Take 1 capsule by mouth daily.     dapagliflozin propanediol (FARXIGA) 5 MG TABS tablet Take 5 mg by mouth daily.     diltiazem (CARDIZEM CD) 240 MG 24 hr capsule Take 1 capsule by mouth once daily 90 capsule 3   GARLIC PO Take 1 tablet by mouth daily.     Ginger, Zingiber officinalis, (GINGER EXTRACT PO) Take 1 tablet by mouth daily.      glucose blood (ACCU-CHEK AVIVA PLUS) test strip Check your blood glucose daily 100 each 12   levothyroxine (SYNTHROID) 150 MCG tablet Take 1 tablet by mouth once daily 30 tablet 0   losartan (COZAAR) 50 MG tablet Take 1 tablet by mouth once daily 90 tablet 1   Multiple Vitamin (MULTIVITAMIN WITH MINERALS) TABS tablet Take 1 tablet by  mouth daily.      Multiple Vitamins-Minerals (EMERGEN-C IMMUNE PO) Take 1 tablet by mouth daily.      rivaroxaban (XARELTO) 20 MG TABS tablet TAKE 1 TABLET BY MOUTH ONCE DAILY WITH SUPPER 30 tablet 5   No current facility-administered medications for this encounter.    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   Social Determinants of Health   Financial Resource Strain: Not on file  Food Insecurity: Not on file  Transportation Needs: Not on file  Physical Activity: Not on file  Stress: Not on file  Social Connections: Not on file  Intimate Partner Violence: Not on file    Family History  Problem Relation Age of Onset   Cancer Mother    Colon cancer Mother        in 26s   Diabetes type II Mother    Heart disease Father    Coronary artery disease Sister        died of an MI in her 35's   Heart disease Sister        dies at age 10   Diabetes type II Sister    Kidney disease Sister    Cancer Maternal Grandmother        ?leukemia   Hypertension Other    Leukemia Nephew        30    ROS- All systems are reviewed and negative except as per the HPI above  Physical Exam: Vitals:   09/05/22 1506  BP: 126/86  Pulse: (!) 129  Weight: (!) 153.3 kg  Height: _0  (1.676 m)   Wt Readings  from Last 3 Encounters:  09/05/22 (!) 153.3 kg  06/13/22 (!) 145.2 kg  04/21/22 (!) 158.8 kg    Labs: Lab Results  Component Value Date   NA 138 06/13/2022   K 4.5 06/13/2022   CL 100 06/13/2022   CO2 27 06/13/2022   GLUCOSE 250 (H) 06/13/2022   BUN 37 (H) 06/13/2022   CREATININE 2.24 (H) 06/13/2022   CALCIUM 9.2 06/13/2022   MG 2.3 12/03/2020   Lab Results  Component Value Date   INR 1.6 (H) 10/26/2020   Lab Results  Component Value Date   CHOL 231 (H) 04/06/2022   HDL 60 04/06/2022   LDLCALC 149 (H) 04/06/2022   TRIG 106 04/06/2022     GEN- The patient is well appearing, alert and oriented x 3 today.   Head- normocephalic, atraumatic Eyes-  Sclera clear, conjunctiva pink Ears- hearing intact Oropharynx- clear Neck- supple, no JVP Lymph- no cervical lymphadenopathy Lungs- Clear to ausculation bilaterally, normal work of breathing Heart- regular rate and rhythm, no murmurs, rubs or gallops, PMI not laterally displaced GI- soft, NT, ND, + BS Extremities- no clubbing, cyanosis, or edema MS- no significant deformity or atrophy Skin- no rash or lesion Psych- euthymic mood, full affect Neuro- strength and sensation are intact  EKG- Vent. rate 129 BPM PR interval * ms QRS duration 94 ms QT/QTcB 322/471 ms P-R-T axes 91 56 100 Atrial flutter with 2:1 A-V conduction Nonspecific ST and T wave abnormality Abnormal ECG When compared with ECG of 10-Jun-2021 10:37, PREVIOUS ECG IS PRESENT  Echo- 12/01/20-1. Left ventricular ejection fraction, by estimation, is 45 to 50%. The left ventricle has mildly decreased function. The left ventricle demonstrates global hypokinesis. The left ventricular internal cavity size was moderately dilated. Left ventricular diastolic parameters were  normal. 2. Right ventricular systolic function is normal. The right ventricular size is normal. There is normal pulmonary artery systolic pressure. The estimated right ventricular systolic  pressure is 8.8 mmHg. 3. The mitral valve is normal in structure. Mild to moderate mitral valve regurgitation. No evidence of mitral stenosis. 4. The aortic valve is normal in structure. Aortic valve regurgitation is not visualized. No aortic stenosis is present. 5. The inferior vena cava is normal in size with greater than 50% respiratory variability, suggesting right atrial pressure of 3 mmHg.  Assessment and Plan: 1. H/o of afib, typical and atypical  atrial flutter  Failed tikoyn with prolonged qt   She was started on amiodarone. She had gotten down to 100 mg daily maintaining  SR but with recent bronchitis went into afib with RVR She increased her amiodarone to 200 mg bid  Will will plan on cardioversion in around another week so she can fully recover from bronchitis.  She feels improved but still coughing some  Continue on  Carvedilol 25 mg bid, CCB 240 mg  1x a day Cbc/bmet    2. CHA2DS2VASc score of 4 Continue xarelto 20 mg daily   3. BMI of 49.23 Weight loss encouraged  She has lost  10-12 lbs by being  back to walking daily   4. HTN Stable    afib clinic one week after cardioversion   Butch Penny C. Emil Klassen, [facility] [address] [place], [place] [address] [phone]

## 2022-09-06 ENCOUNTER — Other Ambulatory Visit (HOSPITAL_COMMUNITY): Payer: Self-pay | Admitting: *Deleted

## 2022-09-06 ENCOUNTER — Encounter: Payer: Self-pay | Admitting: *Deleted

## 2022-09-06 ENCOUNTER — Other Ambulatory Visit: Payer: Self-pay | Admitting: Nurse Practitioner

## 2022-09-06 ENCOUNTER — Telehealth: Payer: Self-pay | Admitting: *Deleted

## 2022-09-06 DIAGNOSIS — I4819 Other persistent atrial fibrillation: Secondary | ICD-10-CM

## 2022-09-06 DIAGNOSIS — E039 Hypothyroidism, unspecified: Secondary | ICD-10-CM

## 2022-09-06 MED ORDER — LEVOTHYROXINE SODIUM 175 MCG PO TABS: 175.0000 ug | ORAL_TABLET | Freq: Every day | ORAL | 0 refills | Status: DC

## 2022-09-06 NOTE — Telephone Encounter (Signed)
-----   Message from Juluis Mire, RN sent at 09/05/2022  3:40 PM EDT ----- Regarding: help scheduling dccv at [facility] Can you help me get this pt scheduled for a cardioversion at [facility] in about 10 days or so? We drew her labs today and I can drop the orders. Thank yoU! Marzetta Board

## 2022-09-06 NOTE — Telephone Encounter (Signed)
DCCV 09/15/2022 @10 :00 am at [facility] with Dr. Malva Cogan by Roderic Palau for Atrial Fibrillation Pre-Admission Appointment 09/13/2022 @1 :45 pm at [facility]  Patient informed and verbalized understanding of plan. DCCV instructions reviewed with patient in detail and made available through mychart.

## 2022-09-08 NOTE — Patient Instructions (Signed)
Isabella Hall  09/08/2022     @PREFPERIOPPHARMACY @   Your procedure is scheduled on  09/15/2022.   Report to [facility] at  0800 A.M.   Call this number if you have problems the morning of surgery:  [phone]   Remember:  Do not eat or drink after midnight.         DO NOT take any medications for diabetes the morning of your procedure.      DO NOT miss any doses of your eliquis before your procedure.     Take these medicines the morning of surgery with A SIP OF WATER                                            diltiazem, synthroid.     Do not wear jewelry, make-up or nail polish.  Do not wear lotions, powders, or perfumes, or deodorant.  Do not shave 48 hours prior to surgery.  Men may shave face and neck.  Do not bring valuables to the hospital.  [facility] is not responsible for any belongings or valuables.  Contacts, dentures or bridgework may not be worn into surgery.  Leave your suitcase in the car.  After surgery it may be brought to your room.  For patients admitted to the hospital, discharge time will be determined by your treatment team.  Patients discharged the day of surgery will not be allowed to drive home and must have someone with them for 24 hours.    Special instructions:   DO NOT smoke tobacco or vape for 24 hours before your procedure.  Please read over the following fact sheets that you were given. Anesthesia Post-op Instructions and Care and Recovery After Surgery      Electrical Cardioversion Electrical cardioversion is the delivery of a jolt of electricity to restore a normal rhythm to the heart. A rhythm that is too fast or is not regular keeps the heart from pumping well. In this procedure, sticky patches or metal paddles are placed on the chest to deliver electricity to the heart from a device. This procedure may be done in an emergency if: There is low or no blood pressure as a result of the heart rhythm. Normal rhythm must  be restored as fast as possible to protect the brain and heart from further damage. It may save a life. This may also be a scheduled procedure for irregular or fast heart rhythms that are not immediately life-threatening. Tell a health care provider about: Any allergies you have. All medicines you are taking, including vitamins, herbs, eye drops, creams, and over-the-counter medicines. Any problems you or family members have had with anesthetic medicines. Any blood disorders you have. Any surgeries you have had. Any medical conditions you have. Whether you are pregnant or may be pregnant. What are the risks? Generally, this is a safe procedure. However, problems may occur, including: Allergic reactions to medicines. A blood clot that breaks free and travels to other parts of your body. The possible return of an abnormal heart rhythm within hours or days after the procedure. Your heart stopping (cardiac arrest). This is rare. What happens before the procedure? Medicines Your health care provider may have you start taking: Blood-thinning medicines (anticoagulants) so your blood does not clot as easily. Medicines to help stabilize your heart rate and rhythm. Ask  your health care provider about: Changing or stopping your regular medicines. This is especially important if you are taking diabetes medicines or blood thinners. Taking medicines such as aspirin and ibuprofen. These medicines can thin your blood. Do not take these medicines unless your health care provider tells you to take them. Taking over-the-counter medicines, vitamins, herbs, and supplements. General instructions Follow instructions from your health care provider about eating or drinking restrictions. Plan to have someone take you home from the hospital or clinic. If you will be going home right after the procedure, plan to have someone with you for 24 hours. Ask your health care provider what steps will be taken to help  prevent infection. These may include washing your skin with a germ-killing soap. What happens during the procedure?  An IV will be inserted into one of your veins. Sticky patches (electrodes) or metal paddles may be placed on your chest. You will be given a medicine to help you relax (sedative). An electrical shock will be delivered. The procedure may vary among health care providers and hospitals. What can I expect after the procedure? Your blood pressure, heart rate, breathing rate, and blood oxygen level will be monitored until you leave the hospital or clinic. Your heart rhythm will be watched to make sure it does not change. You may have some redness on the skin where the shocks were given. Follow these instructions at home: Do not drive for 24 hours if you were given a sedative during your procedure. Take over-the-counter and prescription medicines only as told by your health care provider. Ask your health care provider how to check your pulse. Check it often. Rest for 48 hours after the procedure or as told by your health care provider. Avoid or limit your caffeine use as told by your health care provider. Keep all follow-up visits as told by your health care provider. This is important. Contact a health care provider if: You feel like your heart is beating too quickly or your pulse is not regular. You have a serious muscle cramp that does not go away. Get help right away if: You have discomfort in your chest. You are dizzy or you feel faint. You have trouble breathing or you are short of breath. Your speech is slurred. You have trouble moving an arm or leg on one side of your body. Your fingers or toes turn cold or blue. Summary Electrical cardioversion is the delivery of a jolt of electricity to restore a normal rhythm to the heart. This procedure may be done right away in an emergency or may be a scheduled procedure if the condition is not an emergency. Generally, this is a  safe procedure. After the procedure, check your pulse often as told by your health care provider. This information is not intended to replace advice given to you by your health care provider. Make sure you discuss any questions you have with your health care provider. Document Revised: 11/03/2021 Document Reviewed: 07/07/2019 Elsevier Patient Education  [place] After This sheet gives you information about how to care for yourself after your procedure. Your health care provider may also give you more specific instructions. If you have problems or questions, contact your health care provider. What can I expect after the procedure? After the procedure, it is common to have: Tiredness. Forgetfulness about what happened after the procedure. Impaired judgment for important decisions. Nausea or vomiting. Some difficulty with balance. Follow these instructions at home: For the  time period you were told by your health care provider:     Rest as needed. Do not participate in activities where you could fall or become injured. Do not drive or use machinery. Do not drink alcohol. Do not take sleeping pills or medicines that cause drowsiness. Do not make important decisions or sign legal documents. Do not take care of children on your own. Eating and drinking Follow the diet that is recommended by your health care provider. Drink enough fluid to keep your urine pale yellow. If you vomit: Drink water, juice, or soup when you can drink without vomiting. Make sure you have little or no nausea before eating solid foods. General instructions Have a responsible adult stay with you for the time you are told. It is important to have someone help care for you until you are awake and alert. Take over-the-counter and prescription medicines only as told by your health care provider. If you have sleep apnea, surgery and certain medicines can increase your risk for  breathing problems. Follow instructions from your health care provider about wearing your sleep device: Anytime you are sleeping, including during daytime naps. While taking prescription pain medicines, sleeping medicines, or medicines that make you drowsy. Avoid smoking. Keep all follow-up visits as told by your health care provider. This is important. Contact a health care provider if: You keep feeling nauseous or you keep vomiting. You feel light-headed. You are still sleepy or having trouble with balance after 24 hours. You develop a rash. You have a fever. You have redness or swelling around the IV site. Get help right away if: You have trouble breathing. You have new-onset confusion at home. Summary For several hours after your procedure, you may feel tired. You may also be forgetful and have poor judgment. Have a responsible adult stay with you for the time you are told. It is important to have someone help care for you until you are awake and alert. Rest as told. Do not drive or operate machinery. Do not drink alcohol or take sleeping pills. Get help right away if you have trouble breathing, or if you suddenly become confused. This information is not intended to replace advice given to you by your health care provider. Make sure you discuss any questions you have with your health care provider. Document Revised: 11/08/2021 Document Reviewed: 11/06/2019 Elsevier Patient Education  [place].

## 2022-09-13 ENCOUNTER — Encounter (HOSPITAL_COMMUNITY)
Admission: RE | Admit: 2022-09-13 | Discharge: 2022-09-13 | Disposition: A | Discharge status: Home / Self Care | Payer: 59 | Source: Ambulatory Visit | Attending: Internal Medicine | Admitting: Internal Medicine

## 2022-09-13 ENCOUNTER — Encounter (HOSPITAL_COMMUNITY): Payer: Self-pay | Admitting: Internal Medicine

## 2022-09-15 ENCOUNTER — Encounter (HOSPITAL_COMMUNITY): Payer: Self-pay | Admitting: Internal Medicine

## 2022-09-15 ENCOUNTER — Ambulatory Visit (HOSPITAL_COMMUNITY)
Admission: RE | Admit: 2022-09-15 | Discharge: 2022-09-15 | Disposition: A | Discharge status: Home / Self Care | Payer: 59 | Attending: Internal Medicine | Admitting: Internal Medicine

## 2022-09-15 ENCOUNTER — Ambulatory Visit (HOSPITAL_BASED_OUTPATIENT_CLINIC_OR_DEPARTMENT_OTHER): Payer: 59 | Admitting: Anesthesiology

## 2022-09-15 ENCOUNTER — Encounter (HOSPITAL_COMMUNITY)
Admission: RE | Disposition: A | Discharge status: Home / Self Care | Payer: Self-pay | Source: Home / Self Care | Attending: Internal Medicine

## 2022-09-15 ENCOUNTER — Ambulatory Visit (HOSPITAL_COMMUNITY): Payer: 59 | Admitting: Anesthesiology

## 2022-09-15 DIAGNOSIS — Z7901 Long term (current) use of anticoagulants: Secondary | ICD-10-CM | POA: Diagnosis not present

## 2022-09-15 DIAGNOSIS — I129 Hypertensive chronic kidney disease with stage 1 through stage 4 chronic kidney disease, or unspecified chronic kidney disease: Secondary | ICD-10-CM | POA: Insufficient documentation

## 2022-09-15 DIAGNOSIS — I4892 Unspecified atrial flutter: Secondary | ICD-10-CM | POA: Diagnosis not present

## 2022-09-15 DIAGNOSIS — N189 Chronic kidney disease, unspecified: Secondary | ICD-10-CM | POA: Diagnosis not present

## 2022-09-15 DIAGNOSIS — I4891 Unspecified atrial fibrillation: Secondary | ICD-10-CM | POA: Diagnosis not present

## 2022-09-15 DIAGNOSIS — E039 Hypothyroidism, unspecified: Secondary | ICD-10-CM | POA: Insufficient documentation

## 2022-09-15 DIAGNOSIS — Z6841 Body Mass Index (BMI) 40.0 and over, adult: Secondary | ICD-10-CM | POA: Insufficient documentation

## 2022-09-15 DIAGNOSIS — E1122 Type 2 diabetes mellitus with diabetic chronic kidney disease: Secondary | ICD-10-CM

## 2022-09-15 DIAGNOSIS — I4819 Other persistent atrial fibrillation: Secondary | ICD-10-CM | POA: Diagnosis not present

## 2022-09-15 HISTORY — PX: CARDIOVERSION: SHX1299

## 2022-09-15 LAB — GLUCOSE, CAPILLARY: Glucose-Capillary: 180 mg/dL — ABNORMAL HIGH (ref 70–99)

## 2022-09-15 SURGERY — CARDIOVERSION
Anesthesia: General

## 2022-09-15 MED ORDER — SODIUM CHLORIDE 0.9 % IV SOLN: INTRAVENOUS | Status: DC

## 2022-09-15 MED ORDER — ORAL CARE MOUTH RINSE: 15.0000 mL | Freq: Once | OROMUCOSAL | Status: DC

## 2022-09-15 MED ORDER — LACTATED RINGERS IV SOLN: INTRAVENOUS | Status: DC

## 2022-09-15 MED ORDER — PROPOFOL 10 MG/ML IV BOLUS
INTRAVENOUS | Status: DC | PRN
  Administered 2022-09-15: 90 mg via INTRAVENOUS

## 2022-09-15 MED ORDER — CHLORHEXIDINE GLUCONATE 0.12 % MT SOLN: 15.0000 mL | Freq: Once | OROMUCOSAL | Status: DC

## 2022-09-15 NOTE — Transfer of Care (Signed)
Immediate Anesthesia Transfer of Care Note  Patient: Isabella Hall  Procedure(s) Performed: CARDIOVERSION  Patient Location: PACU  Anesthesia Type:General  Level of Consciousness: drowsy  Airway & Oxygen Therapy: Patient Spontanous Breathing and Patient connected to nasal cannula oxygen  Post-op Assessment: Report given to RN and Post -op Vital signs reviewed and stable  Post vital signs: Reviewed and stable  Last Vitals:  Vitals Value Taken Time  BP 116/76   Temp 98.1   Pulse 56   Resp 18   SpO2 100%     Last Pain:  Vitals:   09/15/22 0851  TempSrc: Oral  PainSc: 0-No pain         Complications: No notable events documented.

## 2022-09-15 NOTE — Anesthesia Preprocedure Evaluation (Signed)
Anesthesia Evaluation  Patient identified by MRN, date of birth, ID band Patient awake    Reviewed: Allergy & Precautions, H&P , NPO status , Patient's Chart, lab work & pertinent test results, reviewed documented beta blocker date and time   History of Anesthesia Complications (+) PONV and history of anesthetic complications  Airway Mallampati: II  TM Distance: >3 FB Neck ROM: full    Dental no notable dental hx.    Pulmonary neg pulmonary ROS,    Pulmonary exam normal breath sounds clear to auscultation       Cardiovascular Exercise Tolerance: Good hypertension, negative cardio ROS   Rhythm:irregular Rate:Normal     Neuro/Psych negative neurological ROS  negative psych ROS   GI/Hepatic negative GI ROS, Neg liver ROS,   Endo/Other  diabetes, Type 2Hypothyroidism Morbid obesity  Renal/GU CRFRenal disease  negative genitourinary   Musculoskeletal   Abdominal   Peds  Hematology negative hematology ROS (+)   Anesthesia Other Findings   Reproductive/Obstetrics negative OB ROS                             Anesthesia Physical Anesthesia Plan  ASA: 3  Anesthesia Plan: General   Post-op Pain Management:    Induction:   PONV Risk Score and Plan: Propofol infusion  Airway Management Planned:   Additional Equipment:   Intra-op Plan:   Post-operative Plan:   Informed Consent: I have reviewed the patients History and Physical, chart, labs and discussed the procedure including the risks, benefits and alternatives for the proposed anesthesia with the patient or authorized representative who has indicated his/her understanding and acceptance.     Dental Advisory Given  Plan Discussed with: CRNA  Anesthesia Plan Comments:         Anesthesia Quick Evaluation

## 2022-09-15 NOTE — CV Procedure (Signed)
   Electrical Cardioversion Procedure Note Isabella Hall [id number] [birth year]  Procedure: Electrical Cardioversion Indications:  Atrial Fibrillation  Time Out: Verified patient identification, verified procedure,medications/allergies/relevent history reviewed, required imaging and test results available.  Performed  Procedure Details  The patient was NPO after midnight. Anesthesia was administered at the beside  by Dr. Lestine Mount team.  Cardioversion was done with synchronized biphasic defibrillation with AP pads with 200 Joules.  The patient converted to normal sinus rhythm. The patient tolerated the procedure well   IMPRESSION:  Successful cardioversion of atrial fibrillation    Isabella Hall 09/15/2022, 9:59 AM

## 2022-09-15 NOTE — Interval H&P Note (Signed)
History and Physical Interval Note:  09/15/2022 9:51 AM  Isabella Hall  has presented today for surgery, with the diagnosis of a-fib.  The various methods of treatment have been discussed with the patient and family. After consideration of risks, benefits and other options for treatment, the patient has consented to  Procedure(s): CARDIOVERSION (N/A) as a surgical intervention.  The patient's history has been reviewed, patient examined, no change in status, stable for surgery.  I have reviewed the patient's chart and labs.  Questions were answered to the patient's satisfaction.     Denai Caba A Khaniyah Bezek

## 2022-09-15 NOTE — Progress Notes (Signed)
Electrical Cardioversion Procedure Note Isabella Hall [id number] 01/15/1956  Procedure: Electrical Cardioversion Indications:  Atrial Fibrillation  Procedure Details Consent: Risks of procedure as well as the alternatives and risks of each were explained to the (patient/caregiver).  Consent for procedure obtained. Time Out: Verified patient identification, verified procedure, site/side was marked, verified correct patient position, special equipment/implants available, medications/allergies/relevent history reviewed, required imaging and test results available.  Performed @ [phone]   Patient placed on cardiac monitor, pulse oximetry, supplemental oxygen as necessary.  Sedation given:  Propofol Pacer pads placed anterior and posterior chest.  Cardioverted 1 time(s).  Cardioverted at [place].  Evaluation Findings: Post procedure EKG shows:  Sinus bradycardia Complications: None Patient did tolerate procedure well.   Isabella Hall 09/15/2022, 10:25 AM

## 2022-09-15 NOTE — Anesthesia Postprocedure Evaluation (Signed)
Anesthesia Post Note  Patient: Isabella Hall  Procedure(s) Performed: CARDIOVERSION  Patient location during evaluation: Phase II Anesthesia Type: General Level of consciousness: awake Pain management: pain level controlled Vital Signs Assessment: post-procedure vital signs reviewed and stable Respiratory status: spontaneous breathing and respiratory function stable Cardiovascular status: blood pressure returned to baseline and stable Postop Assessment: no headache and no apparent nausea or vomiting Anesthetic complications: no Comments: Late entry   No notable events documented.   Last Vitals:  Vitals:   09/15/22 1100 09/15/22 1107  BP: 123/80 (!) 124/90  Pulse: 60 (!) 58  Resp: 17 20  Temp: 36.7 C 36.7 C  SpO2: 98% 98%    Last Pain:  Vitals:   09/15/22 1107  TempSrc: Oral  PainSc: 0-No pain                 Louann Sjogren

## 2022-09-21 ENCOUNTER — Encounter (HOSPITAL_COMMUNITY): Payer: Self-pay | Admitting: Internal Medicine

## 2022-09-30 ENCOUNTER — Other Ambulatory Visit: Payer: Self-pay | Admitting: Internal Medicine

## 2022-10-03 ENCOUNTER — Encounter (HOSPITAL_COMMUNITY): Payer: Self-pay | Admitting: Nurse Practitioner

## 2022-10-03 ENCOUNTER — Ambulatory Visit (HOSPITAL_COMMUNITY)
Admission: RE | Admit: 2022-10-03 | Discharge: 2022-10-03 | Disposition: A | Discharge status: Home / Self Care | Payer: 59 | Source: Ambulatory Visit | Attending: Nurse Practitioner | Admitting: Nurse Practitioner

## 2022-10-03 VITALS — BP 190/92 | HR 53 | Ht 66.0 in | Wt 330.2 lb

## 2022-10-03 DIAGNOSIS — I4891 Unspecified atrial fibrillation: Secondary | ICD-10-CM | POA: Insufficient documentation

## 2022-10-03 DIAGNOSIS — D6869 Other thrombophilia: Secondary | ICD-10-CM | POA: Diagnosis not present

## 2022-10-03 DIAGNOSIS — I4819 Other persistent atrial fibrillation: Secondary | ICD-10-CM

## 2022-10-03 DIAGNOSIS — Z79899 Other long term (current) drug therapy: Secondary | ICD-10-CM | POA: Diagnosis not present

## 2022-10-03 DIAGNOSIS — E118 Type 2 diabetes mellitus with unspecified complications: Secondary | ICD-10-CM | POA: Insufficient documentation

## 2022-10-03 DIAGNOSIS — Z7901 Long term (current) use of anticoagulants: Secondary | ICD-10-CM | POA: Insufficient documentation

## 2022-10-03 DIAGNOSIS — I509 Heart failure, unspecified: Secondary | ICD-10-CM | POA: Diagnosis not present

## 2022-10-03 DIAGNOSIS — I428 Other cardiomyopathies: Secondary | ICD-10-CM | POA: Insufficient documentation

## 2022-10-03 DIAGNOSIS — I110 Hypertensive heart disease with heart failure: Secondary | ICD-10-CM | POA: Insufficient documentation

## 2022-10-03 MED ORDER — AMIODARONE HCL 200 MG PO TABS: ORAL_TABLET | ORAL | Status: DC

## 2022-10-03 MED ORDER — RIVAROXABAN 20 MG PO TABS: 20.0000 mg | ORAL_TABLET | Freq: Every day | ORAL | 6 refills | Status: DC

## 2022-10-03 NOTE — Progress Notes (Addendum)
Primary Care Physician: Alvira Monday, [place] Referring Physician: Dr. Serita Kyle Isabella Hall is a 67 y.o. female with a h/o  Afib, s/p ablation in 2019, CHF, HTN, Mod MR, NICM, DMT2 that is in the afib clinic for f/u recent cardioversion. She  saw Dr. [name]   in the [facility]  clinic 11/5 for typical atrial flutter and she had a successful cardioversion. Ekg now shows atypical atrial flutter vrs coarse afib. She felt improved after CV for around 3 days and then started feeling tired  again. Her v rate is 120 bpm today. Her cardizem was reduced from her usual dose of 240 mg bid to 240 mg daily at time of CV. She had lost down to 260 lb, thru diet and walking several days a week, but got "lazy",  stopped walking, diet slipped  and her weight has now  increased to over 300 lbs again.   F/u in afib clinic for tikosyn admit, 11/30/20. No missed xarelto doses x at least 3 weeks, no benadryl use. Plans to get drug thru good rx. Qtc today at 463 ms, atrial flutter with variable block at 103 bpm. In rhythm, I have seen qt at 440 ms .  F/u tikosyn admission,12/08/20.  Unfortunately,  her qr prolonged so tikosyn was stopped. She also had a nosebleed in the hospital that was difficult to control but finally resolved with no further issues. She  is in SR today but d/c instructions say to start amio 200 mg bid today. Her qtc is 418 ms today. She feels better and has gone back to walking and has lost 7 lbs.   Pt is here for f/u, 12/14/20, after being on amiodarone 200 mg bid, started last week. She failed Tikosyn admit for prolonged qt. EKG shows sinus brady at 59 bpm, qt stable at 451 ms and has lost several more pounds as she now feels like walking for exercise. She is tolerating amio well.   F/u 09/05/22. She had been doing well until she developed bronchitis 2 weeks ago. At that time, she went into afib and she increased her amiodarone from  100 mg daily to 200 mg bid. She has RVR today. She feels her bronchitis  is improving. We discussed pursing CV in another week to allow her to fully recover from bronchitis and she is in agreement. She states she covid tested and it was negative. She would prefer it to be at [facility]. She has gained 18 lbs since last visssit here but pt does not feel it is edema. No pedal edema seen.   F/u in afib clinic, 10/03/22. She had a successful cardioversion and remains in SR. She has recovered from her bronchitis and feels much improved.   Today, she denies symptoms of palpitations, chest pain, shortness of breath, orthopnea, PND, lower extremity edema, dizziness, presyncope, syncope, or neurologic sequela. The patient is tolerating medications without difficulties and is otherwise without complaint today.   Past Medical History:  Diagnosis Date   Atrial fibrillation ([place])     CHADSVASC score 4   CKD (chronic kidney disease)    Essential hypertension    Hypothyroidism    Obesity    PONV (postoperative nausea and vomiting)    Type 2 diabetes, diet controlled ([place])    Typical atrial flutter ([place]) 09/2020   Past Surgical History:  Procedure Laterality Date   APPENDECTOMY  1979   ATRIAL FIBRILLATION ABLATION  03/26/2018   ATRIAL FIBRILLATION ABLATION N/A 03/26/2018  Procedure: ATRIAL FIBRILLATION ABLATION;  Surgeon: Thompson Grayer, MD;  Location: [place] CV LAB;  Service: Cardiovascular;  Laterality: N/A;   CARDIOVERSION N/A 01/25/2015   Procedure: CARDIOVERSION;  Surgeon: Satira Sark, MD;  Location: [facility];  Service: Cardiovascular;  Laterality: N/A;   CARDIOVERSION N/A 10/28/2020   Procedure: CARDIOVERSION;  Surgeon: Satira Sark, MD;  [facility];  Service: Cardiovascular;  Laterality: N/A;   CARDIOVERSION N/A 06/10/2021   Procedure: CARDIOVERSION;  Surgeon: Skeet Latch, MD;  Location: [place];  Service: Cardiovascular;  Laterality: N/A;   CARDIOVERSION N/A 09/15/2022   Procedure: CARDIOVERSION;  Surgeon: Werner Lean, MD;   Location: [facility];  Service: Cardiovascular;  Laterality: N/A;   CHOLECYSTECTOMY OPEN  1979   COLONOSCOPY WITH PROPOFOL N/A 06/16/2022   Procedure: COLONOSCOPY WITH PROPOFOL;  Surgeon: Eloise Harman, DO;  [facility];  Service: Endoscopy;  Laterality: N/A;  3:00pm   LEFT HEART CATHETERIZATION WITH CORONARY ANGIOGRAM N/A 01/27/2015   Procedure: LEFT HEART CATHETERIZATION WITH CORONARY ANGIOGRAM;  Surgeon: Sinclair Grooms, MD;  Location: [facility] CATH LAB;  Service: Cardiovascular;  Laterality: N/A;   TEE WITHOUT CARDIOVERSION N/A 01/25/2015   Procedure: TRANSESOPHAGEAL ECHOCARDIOGRAM (TEE);  Surgeon: Satira Sark, MD;  Location: [facility];  Service: Cardiovascular;  Laterality: N/A;   TUBAL LIGATION  1980    Current Outpatient Medications  Medication Sig Dispense Refill   atorvastatin (LIPITOR) 10 MG tablet Take 1 tablet (10 mg total) by mouth daily. 90 tablet 3   blood glucose meter kit and supplies KIT Dispense based on patient and insurance preference. Use up to four times daily as directed. (FOR ICD-9 250.00, 250.01). 1 each 0   Capsicum, Cayenne, (CAYENNE PO) Take 1 Scoop by mouth daily.      carvedilol (COREG) 25 MG tablet TAKE 1 TABLET BY MOUTH TWICE DAILY WITH A MEAL 180 tablet 3   cholecalciferol (VITAMIN D3) 25 MCG (1000 UNIT) tablet Take 1,000 Units by mouth daily.     CINNAMON PO Take 1 capsule by mouth daily.     dapagliflozin propanediol (FARXIGA) 5 MG TABS tablet Take 5 mg by mouth daily.     diltiazem (CARDIZEM CD) 240 MG 24 hr capsule Take 1 capsule by mouth once daily 90 capsule 3   GARLIC PO Take 1 tablet by mouth daily.     Ginger, Zingiber officinalis, (GINGER EXTRACT PO) Take 1 tablet by mouth daily.      glucose blood (ACCU-CHEK AVIVA PLUS) test strip Check your blood glucose daily 100 each 12   levothyroxine (SYNTHROID) 175 MCG tablet Take 1 tablet (175 mcg total) by mouth daily. 90 tablet 0   losartan (COZAAR) 50 MG tablet Take 1 tablet by mouth once daily 90  tablet 1   Multiple Vitamin (MULTIVITAMIN WITH MINERALS) TABS tablet Take 1 tablet by mouth daily.      Multiple Vitamins-Minerals (EMERGEN-C IMMUNE PO) Take 1 tablet by mouth daily.      rivaroxaban (XARELTO) 20 MG TABS tablet TAKE 1 TABLET BY MOUTH ONCE DAILY WITH SUPPER 30 tablet 5   amiodarone (PACERONE) 200 MG tablet Take one tablet by mouth once daily     No current facility-administered medications for this encounter.    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   Social Determinants of Health   Financial Resource Strain: Not on file  Food Insecurity: Not on file  Transportation Needs: Not on file  Physical Activity: Not on file  Stress: Not on file  Social Connections: Not on file  Intimate Partner Violence: Not on file    Family History  Problem Relation Age of Onset   Cancer Mother    Colon cancer Mother        in 22s   Diabetes type II Mother    Heart disease Father    Coronary artery disease Sister        died of an MI in her 72's   Heart disease Sister        dies at age 85   Diabetes type II Sister    Kidney disease Sister    Cancer Maternal Grandmother        ?leukemia   Hypertension Other    Leukemia Nephew        30    ROS- All systems are reviewed and negative  except as per the HPI above  Physical Exam: Vitals:   10/03/22 1525  BP: (!) 190/92  Pulse: (!) 53  Weight: (!) 149.8 kg  Height: _0  (1.676 m)   Wt Readings from Last 3 Encounters:  10/03/22 (!) 149.8 kg  09/15/22 (!) 153.3 kg  09/05/22 (!) 153.3 kg    Labs: Lab Results  Component Value Date   NA 138 09/05/2022   K 4.1 09/05/2022   CL 100 09/05/2022   CO2 27 09/05/2022   GLUCOSE 181 (H) 09/05/2022   BUN 18 09/05/2022   CREATININE 1.95 (H) 09/05/2022   CALCIUM 9.3 09/05/2022   MG 2.3 12/03/2020   Lab Results  Component Value Date   INR 1.6 (H) 10/26/2020   Lab Results  Component Value Date   CHOL 231 (H) 04/06/2022   HDL 60 04/06/2022   LDLCALC 149 (H) 04/06/2022   TRIG 106 04/06/2022     GEN- The patient is well appearing, alert and oriented x 3 today.   Head- normocephalic, atraumatic Eyes-  Sclera clear, conjunctiva pink Ears- hearing intact Oropharynx- clear Neck- supple, no JVP Lymph- no cervical lymphadenopathy Lungs- Clear to ausculation bilaterally, normal work of breathing Heart- regular rate and rhythm, no murmurs, rubs or gallops, PMI not laterally displaced GI- soft, NT, ND, + BS Extremities- no clubbing, cyanosis, or edema MS- no significant deformity or atrophy Skin- no rash or lesion Psych- euthymic mood, full affect Neuro- strength and sensation are intact  Ekg-Vent. rate 53 BPM PR interval 148 ms QRS duration 94 ms QT/QTcB 530/497 ms P-R-T axes 58 55 72 Sinus bradycardia Prolonged QT Abnormal ECG When compared with ECG of 15-Sep-2022 10:00, PREVIOUS ECG IS PRESENT  Echo- 12/01/20-1. Left ventricular ejection fraction, by estimation, is 45 to 50%. The left ventricle has mildly decreased function. The  left ventricle demonstrates global hypokinesis. The left ventricular internal cavity size was moderately dilated. Left ventricular diastolic parameters were normal. 2. Right ventricular systolic function is normal. The right  ventricular size is normal. There is normal pulmonary artery systolic pressure. The estimated right ventricular systolic pressure is 8.8 mmHg. 3. The mitral valve is normal in structure. Mild to moderate mitral valve regurgitation. No evidence of mitral stenosis. 4. The aortic valve is normal in structure. Aortic valve regurgitation is not visualized. No aortic stenosis is present. 5. The inferior vena cava is normal in size with greater than 50% respiratory variability, suggesting right atrial pressure of 3 mmHg.  Assessment and Plan: 1. H/o of afib, typical and atypical atrial flutter  Failed tikoyn with prolonged qt  She was started on amiodarone. She had gotten down to 100 mg daily maintaining  SR but with recent bronchitis went into afib with RVR Continue  amiodarone at 200 mg bid  Continue on  Carvedilol 25 mg bid, CCB 240 mg  1x a day She has lost 20 lbs recently by modifying diet   2. CHA2DS2VASc score of 4 Continue xarelto 20 mg daily   3. BMI of 53.30 Weight loss encouraged  She has lost  around 20 lbs recently by diet modification   4. HTN Elevated here today Encouraged to check on return home    To establish with Dr. Domenic Polite in [place] in 6 months as that is where pt lives  Geroge Baseman. Roe Wilner, [facility] [address] [place], [place] [address] [phone]

## 2022-10-04 ENCOUNTER — Ambulatory Visit: Payer: 59 | Admitting: Family Medicine

## 2022-10-23 ENCOUNTER — Encounter: Payer: 59 | Admitting: Nurse Practitioner

## 2022-10-24 ENCOUNTER — Ambulatory Visit (INDEPENDENT_AMBULATORY_CARE_PROVIDER_SITE_OTHER): Payer: 59 | Admitting: Family Medicine

## 2022-10-24 ENCOUNTER — Encounter: Payer: Self-pay | Admitting: Family Medicine

## 2022-10-24 VITALS — BP 138/82 | HR 73 | Ht 66.0 in | Wt 337.1 lb

## 2022-10-24 DIAGNOSIS — E1122 Type 2 diabetes mellitus with diabetic chronic kidney disease: Secondary | ICD-10-CM | POA: Diagnosis not present

## 2022-10-24 DIAGNOSIS — Z0001 Encounter for general adult medical examination with abnormal findings: Secondary | ICD-10-CM

## 2022-10-24 DIAGNOSIS — E039 Hypothyroidism, unspecified: Secondary | ICD-10-CM | POA: Diagnosis not present

## 2022-10-24 DIAGNOSIS — N184 Chronic kidney disease, stage 4 (severe): Secondary | ICD-10-CM

## 2022-10-24 DIAGNOSIS — Z23 Encounter for immunization: Secondary | ICD-10-CM

## 2022-10-24 NOTE — Patient Instructions (Addendum)
I appreciate the opportunity to provide care to you today!    Follow up:  1 week for pap smear  Labs: next visit   Please continue to a heart-healthy diet and increase your physical activities. Try to exercise for 82mins at least three times a week.      It was a pleasure to see you and I look forward to continuing to work together on your health and well-being. Please do not hesitate to call the office if you need care or have questions about your care.   Have a wonderful day and week. With Gratitude, Alvira Monday MSN, FNP-BC

## 2022-10-24 NOTE — Progress Notes (Unsigned)
Complete physical exam  Patient: Isabella Hall   DOB: July 24, 1956   66 y.o. Female  MRN: [id number]  Subjective:    Chief Complaint  Patient presents with   Annual Exam    Cpe today, and thyroid f/u. Will come back for a pap. Would like to discuss RSV vaccine.     Isabella Hall is a 66 y.o. female who presents today for a complete physical exam. She reports consuming a general diet. The patient does not participate in regular exercise at present. She generally feels well. She reports sleeping well. She does not have additional problems to discuss today.   The patient inquired if she should take the RSV vaccination.   Most recent fall risk assessment:    10/24/2022    3:08 PM  [place] in the past year? 0  Number falls in past yr: 0  Injury with Fall? 0  Risk for fall due to : No Fall Risks  Follow up Falls evaluation completed     Most recent depression screenings:    10/24/2022    3:08 PM 04/21/2022    3:13 PM  PHQ 2/9 Scores  PHQ - 2 Score 0 0  PHQ- 9 Score 2     Dental: No current dental problems and Last dental visit: 09/23/2022  Patient Active Problem List   Diagnosis Date Noted   Encounter for general adult medical examination with abnormal findings 10/25/2022   Need for immunization against influenza 10/25/2022   Hyperlipidemia associated with type 2 diabetes mellitus ([place]) 04/22/2022   Encounter for screening colonoscopy 03/31/2022   Chronic anticoagulation 03/31/2022   Afib ([place]) 11/30/2020   Persistent atrial fibrillation ([place]) 03/26/2018   Chronic diastolic CHF (congestive heart failure) (HCC)    Atrial fibrillation with RVR ([place]) 08/06/2017   Acute bronchitis 08/06/2017   Atrial fibrillation with rapid ventricular response (H[facility])    Atrial fibrillation with tachycardic ventricular rate ([place]) 07/04/2016   Nonischemic cardiomyopathy ([place])    Atrial flutter ([place]) 36/46/8032   Acute diastolic heart failure ([place])    Demand ischemia    Moderate  mitral regurgitation    Dyspnea 01/20/2015   Type 2 diabetes mellitus with diabetic chronic kidney disease ([place]) 01/20/2015   Acute CHF- secondary to AF with RVR 01/20/2015   Benign essential HTN 01/20/2015   Hypothyroid- TSH WNL 01/20/2015   Obesity-BMI 45 01/20/2015   Acute respiratory failure with hypoxia ([place]) 01/20/2015   Elevated troponin 01/20/2015   Chronic kidney disease due to type 2 diabetes mellitus ([place]) 01/20/2015   Past Medical History:  Diagnosis Date   Atrial fibrillation ([place])     CHADSVASC score 4   CKD (chronic kidney disease)    Essential hypertension    Hypothyroidism    Obesity    PONV (postoperative nausea and vomiting)    Type 2 diabetes, diet controlled ([place])    Typical atrial flutter ([place]) 09/2020   Past Surgical History:  Procedure Laterality Date   APPENDECTOMY  1979   ATRIAL FIBRILLATION ABLATION  03/26/2018   ATRIAL FIBRILLATION ABLATION N/A 03/26/2018   Procedure: ATRIAL FIBRILLATION ABLATION;  Surgeon: Thompson Grayer, MD;  Location: [place] [facility];  Service: Cardiovascular;  Laterality: N/A;   CARDIOVERSION N/A 01/25/2015   Procedure: CARDIOVERSION;  Surgeon: Satira Sark, MD;  Location[facility];  Service: Cardiovascular;  Laterality: N/A;   CARDIOVERSION N/A 10/28/2020   Procedure: CARDIOVERSION;  Surgeon: Satira Sark, MD;  [facility];  Service: Cardiovascular;  Laterality: N/A;   CARDIOVERSION N/A 06/10/2021   Procedure: CARDIOVERSION;  Surgeon: Skeet Latch, MD;  Location: [place];  Service: Cardiovascular;  Laterality: N/A;   CARDIOVERSION N/A 09/15/2022   Procedure: CARDIOVERSION;  Surgeon: Werner Lean, MD;  Location: [facility];  Service: Cardiovascular;  Laterality: N/A;   CHOLECYSTECTOMY OPEN  1979   COLONOSCOPY WITH PROPOFOL N/A 06/16/2022   Procedure: COLONOSCOPY WITH PROPOFOL;  Surgeon: Eloise Harman, DO;  [facility];  Service: Endoscopy;  Laterality: N/A;  3:00pm   LEFT HEART  CATHETERIZATION WITH CORONARY ANGIOGRAM N/A 01/27/2015   Procedure: LEFT HEART CATHETERIZATION WITH CORONARY ANGIOGRAM;  Surgeon: Sinclair Grooms, MD;  Location: [facility] CATH LAB;  Service: Cardiovascular;  Laterality: N/A;   TEE WITHOUT CARDIOVERSION N/A 01/25/2015   Procedure: TRANSESOPHAGEAL ECHOCARDIOGRAM (TEE);  Surgeon: Satira Sark, MD;  Location: [facility];  Service: Cardiovascular;  Laterality: N/A;   TUBAL LIGATION  1980   Social History   Tobacco Use   Smoking status: Never   Smokeless tobacco: Never  Vaping Use   Vaping Use: Never used  Substance Use Topics   Alcohol use: No    Alcohol/week: 0.0 standard drinks of alcohol   Drug use: No   Social History   Socioeconomic History   Marital status: Divorced    Spouse name: Not on file   Number of children: 2   Years of education: Not on file   Highest education level: Not on file  Occupational History   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   Social Determinants of Health   Financial Resource Strain: Not on file  Food Insecurity: Not on file  Transportation Needs: Not on file  Physical Activity: Not on file  Stress: Not on file  Social Connections: Not on file  Intimate Partner Violence: Not on file   Family Status  Relation Name Status   Mother  Deceased at age 51       cancer   Father  Deceased at age 5       heart disease   Sister  Deceased   Sister  Deceased   MGM  Deceased   MGF  Deceased   PGM  Deceased   PGF  Deceased   Other  (Not Specified)    Agricultural consultant   Family History  Problem Relation Age of Onset   Cancer Mother    Colon cancer Mother        in 20s   Diabetes type II Mother    Heart disease Father    Coronary artery disease Sister        died of an MI in her 68's   Heart disease Sister        dies at age 41   Diabetes type II Sister    Kidney disease Sister    Cancer Maternal Grandmother        ?leukemia   Hypertension Other    Leukemia  Nephew        30   No Known Allergies    Patient Care Team: Alvira Monday, FNP as PCP - General (Family Medicine) Thompson Grayer, MD as PCP - Electrophysiology (Cardiology)   Outpatient Medications Prior to Visit  Medication Sig   amiodarone (PACERONE) 200 MG tablet Take one tablet by mouth once daily   atorvastatin (LIPITOR) 10 MG tablet Take 1 tablet (10 mg total) by mouth daily.   blood glucose meter kit and supplies KIT Dispense based on patient and insurance preference. Use up to four times daily as directed. (FOR ICD-9 250.00, 250.01).   Capsicum, Cayenne, (CAYENNE PO) Take 1 Scoop by mouth daily.    carvedilol (COREG) 25 MG tablet TAKE 1 TABLET BY MOUTH TWICE DAILY WITH A MEAL   cholecalciferol (VITAMIN D3) 25 MCG (1000 UNIT) tablet Take 1,000 Units by mouth daily.   CINNAMON PO Take 1 capsule by mouth daily.   dapagliflozin propanediol (FARXIGA) 5 MG TABS tablet Take 5 mg by mouth daily.   diltiazem (CARDIZEM CD) 240 MG 24 hr capsule Take 1 capsule by mouth once daily   GARLIC PO Take 1 tablet by mouth daily.   Ginger, Zingiber officinalis, (GINGER EXTRACT PO) Take 1 tablet by mouth daily.    glucose blood (ACCU-CHEK AVIVA PLUS) test strip Check your blood glucose daily   levothyroxine (SYNTHROID) 175 MCG tablet Take 1 tablet (175 mcg total) by mouth daily.   losartan (COZAAR) 50 MG tablet Take 1 tablet by mouth once daily   Multiple Vitamin (MULTIVITAMIN WITH MINERALS) TABS tablet Take 1 tablet by mouth daily.    Multiple Vitamins-Minerals (EMERGEN-C IMMUNE PO)  Take 1 tablet by mouth daily.    rivaroxaban (XARELTO) 20 MG TABS tablet TAKE 1 TABLET BY MOUTH ONCE DAILY WITH SUPPER   rivaroxaban (XARELTO) 20 MG TABS tablet Take 1 tablet (20 mg total) by mouth daily with supper.   No facility-administered medications prior to visit.    Review of Systems  Constitutional:  Negative for chills, fever and malaise/fatigue.  HENT:  Negative for congestion and sinus pain.   Eyes:  Negative for pain, discharge and redness.  Respiratory:  Negative for cough, sputum production and shortness of breath.   Cardiovascular:  Negative for chest pain, palpitations, claudication and leg swelling.  Gastrointestinal:  Negative for diarrhea, heartburn and nausea.  Genitourinary:  Negative for flank pain and frequency.  Musculoskeletal:  Negative for back pain and joint pain.  Skin:  Negative for itching.  Neurological:  Negative for dizziness, seizures and headaches.  Endo/Heme/Allergies:  Negative for environmental allergies.  Psychiatric/Behavioral:  Negative for memory loss. The patient does not have insomnia.           Objective:     BP 138/82   Pulse 73   Ht _0  (1.676 m)   Wt (!) 337 lb 1.9 oz (152.9 kg)   SpO2 94%   BMI 54.41 kg/m    Physical Exam   No results found for any visits on 10/24/22.     Assessment & Plan:    Routine Health Maintenance and Physical Exam  Immunization History  Administered Date(s) Administered   Influenza,inj,Quad PF,6+ Mos 09/20/2018, 11/05/2019, 10/24/2022   Influenza-Unspecified 11/05/2019   Moderna SARS-COV2 Booster Vaccination 01/06/2021   Moderna Sars-Covid-2 Vaccination 01/12/2020, 02/09/2020   Pneumococcal Polysaccharide-23 01/21/2015   Zoster Recombinat (Shingrix) 11/05/2019    Health Maintenance  Topic Date Due   OPHTHALMOLOGY EXAM  Never done   Hepatitis  C Screening  Never done   TETANUS/TDAP  Never done   PAP SMEAR-Modifier  Never done   Zoster Vaccines- Shingrix (2 of 2) 12/31/2019    COVID-19 Vaccine (3 - Moderna risk series) 02/03/2021   HEMOGLOBIN A1C  05/31/2021   Diabetic kidney evaluation - Urine ACR  04/07/2023   Diabetic kidney evaluation - GFR measurement  09/06/2023   FOOT EXAM  10/25/2023   MAMMOGRAM  03/01/2024   COLONOSCOPY (Pts 45-37yr Insurance coverage will need to be confirmed)  06/16/2032   INFLUENZA VACCINE  Completed   HIV Screening  Completed   HPV VACCINES  Aged Out    Discussed health benefits of physical activity, and encouraged her to engage in regular exercise appropriate for her age and condition.  Problem List Items Addressed This Visit       Endocrine   Hypothyroid- TSH WNL (Chronic)    Her TSH was elevated on 09/05/2022 She was started on Synthroid  was increased from 150 mcg to 175 mcg daily on 09/06/2022 We will get labs to assess her thyroid levels, and make adjustments to her medication as needed She denies fatigue, constipation, dry skin, and intolerance to cold Lab Results  Component Value Date   TSH 18.255 (H) 09/05/2022         Relevant Orders   TSH + free T4   Type 2 diabetes mellitus with diabetic chronic kidney disease ([place]    She denies polyuria, polydipsia, polyphagia Will assess hemoglobin A1c in 2 weeks Diabetic foot examination performed and completed      Relevant Orders   HM Diabetes Foot Exam (Completed)     Other   Encounter for general adult medical examination with abnormal findings - Primary    Physical exam as documented Counseling is done on healthy lifestyle involving commitment to 150 minutes of exercise per week,  Discussed heart-healthy diet and attaining a healthy weight Changes in health habits are decided on by the patient with goals and time frames set for achieving them. Immunization and cancer screening needs are specifically addressed at this visit Encouraged the patient to take the RSV vaccination as she has several comorbidities and will benefit from the vaccination            Need for immunization against influenza    Patient educated on CDC recommendation for the vaccine. Verbal consent was obtained from the patient, vaccine administered by nurse, no sign of adverse reactions noted at this time. Patient education on arm soreness and use of tylenol or ibuprofen for this patient  was discussed. Patient educated on the signs and symptoms of adverse effect and advise to contact the office if they occur.       Other Visit Diagnoses     Flu vaccine need       Relevant Orders   Flu Vaccine QUAD 6+ mos PF IM (Fluarix Quad PF) (Completed)   Immunization due          Return in about 1 week (around 10/31/2022) for  pap smear.     GAlvira Monday FNP

## 2022-10-25 ENCOUNTER — Telehealth: Payer: Self-pay | Admitting: Family Medicine

## 2022-10-25 DIAGNOSIS — Z5181 Encounter for therapeutic drug level monitoring: Secondary | ICD-10-CM | POA: Insufficient documentation

## 2022-10-25 DIAGNOSIS — Z0001 Encounter for general adult medical examination with abnormal findings: Secondary | ICD-10-CM | POA: Insufficient documentation

## 2022-10-25 DIAGNOSIS — Z23 Encounter for immunization: Secondary | ICD-10-CM | POA: Insufficient documentation

## 2022-10-25 NOTE — Assessment & Plan Note (Signed)
She denies polyuria, polydipsia, polyphagia Will assess hemoglobin A1c in 2 weeks Diabetic foot examination performed and completed

## 2022-10-25 NOTE — Assessment & Plan Note (Signed)
Patient educated on CDC recommendation for the vaccine. Verbal consent was obtained from the patient, vaccine administered by nurse, no sign of adverse reactions noted at this time. Patient education on arm soreness and use of tylenol or ibuprofen for this patient  was discussed. Patient educated on the signs and symptoms of adverse effect and advise to contact the office if they occur.  

## 2022-10-25 NOTE — Telephone Encounter (Signed)
Patient called in with FYI LEVOTHYROXIN changed from 150mg  to 175mg  on 09/06/22

## 2022-10-25 NOTE — Assessment & Plan Note (Addendum)
Her TSH was elevated on 09/05/2022 She was started on Synthroid  was increased from 150 mcg to 175 mcg daily on 09/06/2022 We will get labs to assess her thyroid levels, and make adjustments to her medication as needed She denies fatigue, constipation, dry skin, and intolerance to cold Lab Results  Component Value Date   TSH 18.255 (H) 09/05/2022

## 2022-10-25 NOTE — Assessment & Plan Note (Signed)
Physical exam as documented Counseling is done on healthy lifestyle involving commitment to 150 minutes of exercise per week,  Discussed heart-healthy diet and attaining a healthy weight Changes in health habits are decided on by the patient with goals and time frames set for achieving them. Immunization and cancer screening needs are specifically addressed at this visit Encouraged the patient to take the RSV vaccination as she has several comorbidities and will benefit from the vaccination

## 2022-10-27 ENCOUNTER — Other Ambulatory Visit: Payer: Self-pay

## 2022-10-27 DIAGNOSIS — E039 Hypothyroidism, unspecified: Secondary | ICD-10-CM | POA: Diagnosis not present

## 2022-10-28 LAB — TSH+FREE T4
Free T4: 3.96 ng/dL — ABNORMAL HIGH (ref 0.82–1.77)
TSH: 0.038 u[IU]/mL — ABNORMAL LOW (ref 0.450–4.500)

## 2022-10-30 ENCOUNTER — Other Ambulatory Visit: Payer: Self-pay | Admitting: Family Medicine

## 2022-10-30 DIAGNOSIS — E039 Hypothyroidism, unspecified: Secondary | ICD-10-CM

## 2022-10-30 MED ORDER — LEVOTHYROXINE SODIUM 150 MCG PO TABS: 150.0000 ug | ORAL_TABLET | Freq: Every day | ORAL | 3 refills | Status: DC

## 2022-11-02 ENCOUNTER — Ambulatory Visit: Payer: 59 | Admitting: Family Medicine

## 2022-12-01 ENCOUNTER — Other Ambulatory Visit: Payer: Self-pay | Admitting: Nurse Practitioner

## 2022-12-21 ENCOUNTER — Other Ambulatory Visit: Payer: Self-pay | Admitting: Internal Medicine

## 2023-01-25 ENCOUNTER — Encounter (HOSPITAL_COMMUNITY): Payer: Self-pay | Admitting: *Deleted

## 2023-02-22 ENCOUNTER — Other Ambulatory Visit: Payer: Self-pay

## 2023-02-22 MED ORDER — LOSARTAN POTASSIUM 50 MG PO TABS: 50.0000 mg | ORAL_TABLET | Freq: Every day | ORAL | 1 refills | Status: DC

## 2023-03-20 ENCOUNTER — Other Ambulatory Visit: Payer: Self-pay | Admitting: Cardiovascular Disease

## 2023-03-20 NOTE — Telephone Encounter (Signed)
This is a A-Fib clinic 

## 2023-03-29 ENCOUNTER — Ambulatory Visit: Payer: HMO | Attending: Internal Medicine | Admitting: Internal Medicine

## 2023-03-29 ENCOUNTER — Encounter: Payer: Self-pay | Admitting: Internal Medicine

## 2023-03-29 VITALS — BP 134/86 | HR 55 | Ht 66.0 in | Wt 347.0 lb

## 2023-03-29 DIAGNOSIS — I429 Cardiomyopathy, unspecified: Secondary | ICD-10-CM

## 2023-03-29 DIAGNOSIS — Z79899 Other long term (current) drug therapy: Secondary | ICD-10-CM | POA: Diagnosis not present

## 2023-03-29 MED ORDER — DILTIAZEM HCL ER 120 MG PO CP12: 120.0000 mg | ORAL_CAPSULE | Freq: Two times a day (BID) | ORAL | 3 refills | Status: DC

## 2023-03-29 NOTE — Progress Notes (Signed)
Cardiology Office Note  Date: 03/29/2023   ID: Isabella Hall, DOB June 17, 1956, MRN [id number]  PCP:  Gilmore Laroche, FNP  Cardiologist:  None Electrophysiologist:  Hillis Range, MD (Inactive)   Reason for Office Visit: Follow-up of A-fib   History of Present Illness: Isabella Hall is a 67 y.o. female known to have persistent A-fib s/p ablation in 2019, A-fib s/p DCCV in 08/2022 with LVEF 45 to 50%, hypothyroidism, DM 2, HTN, CKD is here for follow-up visit. EKG today shows NSR. Patient denies any symptoms of angina or DOE. Denies syncope, palpitations or leg swelling. She is currently taking amiodarone 100 mg once daily. Her TSH levels were 0.038 in 10/2022, free T4 was 3.96 (elevated).  Past Medical History:  Diagnosis Date   Atrial fibrillation     CHADSVASC score 4   CKD (chronic kidney disease)    Essential hypertension    Hypothyroidism    Obesity    PONV (postoperative nausea and vomiting)    Type 2 diabetes, diet controlled    Typical atrial flutter 09/2020    Past Surgical History:  Procedure Laterality Date   APPENDECTOMY  1979   ATRIAL FIBRILLATION ABLATION  03/26/2018   ATRIAL FIBRILLATION ABLATION N/A 03/26/2018   Procedure: ATRIAL FIBRILLATION ABLATION;  Surgeon: Hillis Range, MD;  [facility];  Service: Cardiovascular;  Laterality: N/A;   CARDIOVERSION N/A 01/25/2015   Procedure: CARDIOVERSION;  Surgeon: Jonelle Sidle, MD;  Location: [facility];  Service: Cardiovascular;  Laterality: N/A;   CARDIOVERSION N/A 10/28/2020   Procedure: CARDIOVERSION;  Surgeon: Jonelle Sidle, MD;  Location: [facility];  Service: Cardiovascular;  Laterality: N/A;   CARDIOVERSION N/A 06/10/2021   Procedure: CARDIOVERSION;  Surgeon: Chilton Si, MD;  Location: [facility] ENDOSCOPY;  Service: Cardiovascular;  Laterality: N/A;   CARDIOVERSION N/A 09/15/2022   Procedure: CARDIOVERSION;  Surgeon: Christell Constant, MD;  Location: [facility];  Service: Cardiovascular;   Laterality: N/A;   CHOLECYSTECTOMY OPEN  1979   COLONOSCOPY WITH PROPOFOL N/A 06/16/2022   Procedure: COLONOSCOPY WITH PROPOFOL;  Surgeon: Lanelle Bal, DO;  Location: [facility];  Service: Endoscopy;  Laterality: N/A;  3:00pm   LEFT HEART CATHETERIZATION WITH CORONARY ANGIOGRAM N/A 01/27/2015   Procedure: LEFT HEART CATHETERIZATION WITH CORONARY ANGIOGRAM;  Surgeon: Lesleigh Noe, MD;  Location: [facility] CATH LAB;  Service: Cardiovascular;  Laterality: N/A;   TEE WITHOUT CARDIOVERSION N/A 01/25/2015   Procedure: TRANSESOPHAGEAL ECHOCARDIOGRAM (TEE);  Surgeon: Jonelle Sidle, MD;  Location: [facility];  Service: Cardiovascular;  Laterality: N/A;   TUBAL LIGATION  1980    Current Outpatient Medications  Medication Sig Dispense Refill   amiodarone (PACERONE) 200 MG tablet Take one tablet by mouth once daily     atorvastatin (LIPITOR) 10 MG tablet Take 1 tablet (10 mg total) by mouth daily. 90 tablet 3   blood glucose meter kit and supplies KIT Dispense based on patient and insurance preference. Use up to four times daily as directed. (FOR ICD-9 250.00, 250.01). 1 each 0   Capsicum, Cayenne, (CAYENNE PO) Take 1 Scoop by mouth daily.      carvedilol (COREG) 25 MG tablet TAKE 1 TABLET BY MOUTH TWICE DAILY WITH A MEAL 180 tablet 2   cholecalciferol (VITAMIN D3) 25 MCG (1000 UNIT) tablet Take 1,000 Units by mouth daily.     CINNAMON PO Take 1 capsule by mouth daily.     diltiazem (CARDIZEM CD) 240 MG 24 hr capsule Take  1 capsule (240 mg total) by mouth daily. Appointment Required For Further Refills [phone] 30 capsule 0   GARLIC PO Take 1 tablet by mouth daily.     Ginger, Zingiber officinalis, (GINGER EXTRACT PO) Take 1 tablet by mouth daily.      glucose blood (ACCU-CHEK AVIVA PLUS) test strip Check your blood glucose daily 100 each 12   levothyroxine (SYNTHROID) 150 MCG tablet Take 1 tablet (150 mcg total) by mouth daily. 90 tablet 3   losartan (COZAAR) 50 MG tablet Take 1 tablet (50 mg  total) by mouth daily. 90 tablet 1   Multiple Vitamin (MULTIVITAMIN WITH MINERALS) TABS tablet Take 1 tablet by mouth daily.      Multiple Vitamins-Minerals (EMERGEN-C IMMUNE PO) Take 1 tablet by mouth daily.      rivaroxaban (XARELTO) 20 MG TABS tablet Take 1 tablet (20 mg total) by mouth daily with supper. 30 tablet 6   dapagliflozin propanediol (FARXIGA) 5 MG TABS tablet Take 5 mg by mouth daily. (Patient not taking: Reported on 03/29/2023)     No current facility-administered medications for this visit.   Allergies:  Patient has no known allergies.   Social History: The patient  reports that she has never smoked. She has never used smokeless tobacco. She reports that she does not drink alcohol and does not use drugs.   Family History: The patient's family history includes Cancer in her maternal grandmother and mother; Colon cancer in her mother; Coronary artery disease in her sister; Diabetes type II in her mother and sister; Heart disease in her father and sister; Hypertension in an other family member; Kidney disease in her sister; Leukemia in her nephew.   ROS:  Please see the history of present illness. Otherwise, complete review of systems is positive for none.  All other systems are reviewed and negative.   Physical Exam: VS:  BP 134/86   Pulse (!) 55   Ht  (1.676 m)   Wt (!) 347 lb (157.4 kg)   SpO2 98%   BMI 56.01 kg/m , BMI Body mass index is 56.01 kg/m.  Wt Readings from Last 3 Encounters:  03/29/23 (!) 347 lb (157.4 kg)  10/24/22 (!) 337 lb 1.9 oz (152.9 kg)  10/03/22 (!) 330 lb 3.2 oz (149.8 kg)    General: Patient appears comfortable at rest. HEENT: Conjunctiva and lids normal, oropharynx clear with moist mucosa. Neck: Supple, no elevated JVP or carotid bruits, no thyromegaly. Lungs: Clear to auscultation, nonlabored breathing at rest. Cardiac: Regular rate and rhythm, no S3 or significant systolic murmur, no pericardial rub. Abdomen: Soft, nontender, no  hepatomegaly, bowel sounds present, no guarding or rebound. Extremities: No pitting edema, distal pulses 2+. Skin: Warm and dry. Musculoskeletal: No kyphosis. Neuropsychiatric: Alert and oriented x3, affect grossly appropriate.  ECG:  NSR  Recent Labwork: 09/05/2022: ALT 22; AST 27; BUN 18; Creatinine, Ser 1.95; Hemoglobin 12.2; Platelets 257; Potassium 4.1; Sodium 138 10/27/2022: TSH 0.038     Component Value Date/Time   CHOL 231 (H) 04/06/2022 1607   TRIG 106 04/06/2022 1607   HDL 60 04/06/2022 1607   CHOLHDL 3.9 04/06/2022 1607   VLDL 12 01/21/2015 0852   LDLCALC 149 (H) 04/06/2022 1607    Other Studies Reviewed Today:   Assessment and Plan: Patient is a 68 year old F known to have persistent A-fib s/p ablation in 2019, A-fib s/p DCCV in 08/2022 with LVEF 45 to 50%, hypothyroidism, DM 2, HTN, CKD is here for follow-up visit.  #  Afib s/p ablation in 2019, Afib s/p DCCV in 08/2022 -Continue amiodarone 100 mg once daily (has been on amiodarone since 2016, failed Tikosyn due to prolonged QTc interval). If she has no further episodes of atrial fibrillation the next 6 months, will discontinue amiodarone. Obtain TSH as the last TSH and free T4 were abnormal. Obtain PFTs. She has yearly ophthalmology appointments.  EKG today showed normal sinus rhythm. -Decrease the dose of diltiazem from 240 mg to 120 mg once daily -Continue carvedilol 25 mg twice daily -Continue Xarelto 20 mg daily with supper -OSA evaluation with PCP  # NICM LVEF 45 to 50% in 2021: Update 2D echocardiogram # Morbid obesity: Diet and excise counseling provided  I have spent a total of 30 minutes with patient reviewing chart, EKGs, labs and examining patient as well as establishing an assessment and plan that was discussed with the patient.  > 50% of time was spent in direct patient care.     Medication Adjustments/Labs and Tests Ordered: Current medicines are reviewed at length with the patient today.  Concerns  regarding medicines are outlined above.   Tests Ordered: No orders of the defined types were placed in this encounter.   Medication Changes: No orders of the defined types were placed in this encounter.   Disposition:  Follow up  3 months  Signed, Dalya Maselli Verne SpurrPriya Lyah Millirons, MD, 03/29/2023 3:47 PM    [facility] at [facility] [address], [place], [place] [address]

## 2023-03-29 NOTE — Patient Instructions (Signed)
Medication Instructions:  Your physician has recommended you make the following change in your medication:   -Decrease Diltiazem to 120 mg tablets once daily.    Labwork: TSH  Testing/Procedures: Your physician has requested that you have an echocardiogram. Echocardiography is a painless test that uses sound waves to create images of your heart. It provides your doctor with information about the size and shape of your heart and how well your heart's chambers and valves are working. This procedure takes approximately one hour. There are no restrictions for this procedure. Please do NOT wear cologne, perfume, aftershave, or lotions (deodorant is allowed). Please arrive 15 minutes prior to your appointment time.  Your physician has recommended that you have a pulmonary function test. Pulmonary Function Tests are a group of tests that measure how well air moves in and out of your lungs.   Follow-Up: Follow up with Dr. Jenene Slicker in 6 months.   Any Other Special Instructions Will Be Listed Below (If Applicable).     If you need a refill on your cardiac medications before your next appointment, please call your pharmacy.

## 2023-03-30 ENCOUNTER — Telehealth: Payer: Self-pay | Admitting: *Deleted

## 2023-03-30 DIAGNOSIS — I4819 Other persistent atrial fibrillation: Secondary | ICD-10-CM

## 2023-03-30 NOTE — Telephone Encounter (Signed)
I want to see her in 3 months. Can you make an appointment please. Her TSH was abnormal in 10/2022 and she needs to get repeat TSH which I ordered yesterday but I don't see its collected. Can you stress the importance of TSH. Jake Seats, can you order home sleep study if patient is agreeable to sleep study.   Called pt, no answer, unable to leave msg d/t voicemail not being set up.

## 2023-04-16 NOTE — Telephone Encounter (Signed)
Spoke with pt who states that she would like to have echo that TSH done first and then she will talk about having sleep study done.

## 2023-04-26 ENCOUNTER — Other Ambulatory Visit: Payer: Self-pay | Admitting: *Deleted

## 2023-04-26 MED ORDER — RIVAROXABAN 20 MG PO TABS: 20.0000 mg | ORAL_TABLET | Freq: Every day | ORAL | 6 refills | Status: DC

## 2023-04-26 NOTE — Telephone Encounter (Signed)
Prescription refill request for Xarelto received.  Indication: AF Last office visit: 03/29/23  Lenord Carbo MD Weight: 157.4kg Age: 67 Scr: 1.95 on 09/05/22  Epic CrCl: 71.47  Based on above findings Xarelto 20mg  daily is the appropriate dose.  Refill approved.

## 2023-04-30 ENCOUNTER — Ambulatory Visit (HOSPITAL_COMMUNITY)
Admission: RE | Admit: 2023-04-30 | Discharge: 2023-04-30 | Disposition: A | Discharge status: Home / Self Care | Payer: HMO | Source: Ambulatory Visit | Attending: Internal Medicine | Admitting: Internal Medicine

## 2023-04-30 ENCOUNTER — Other Ambulatory Visit (HOSPITAL_COMMUNITY)
Admission: RE | Admit: 2023-04-30 | Discharge: 2023-04-30 | Disposition: A | Discharge status: Home / Self Care | Payer: HMO | Source: Ambulatory Visit | Attending: Internal Medicine | Admitting: Internal Medicine

## 2023-04-30 DIAGNOSIS — Z79899 Other long term (current) drug therapy: Secondary | ICD-10-CM

## 2023-04-30 DIAGNOSIS — I428 Other cardiomyopathies: Secondary | ICD-10-CM | POA: Diagnosis not present

## 2023-04-30 DIAGNOSIS — I429 Cardiomyopathy, unspecified: Secondary | ICD-10-CM

## 2023-04-30 LAB — TSH: TSH: 37.257 u[IU]/mL — ABNORMAL HIGH (ref 0.350–4.500)

## 2023-04-30 LAB — ECHOCARDIOGRAM COMPLETE
Area-P 1/2: 4.31 cm2
S' Lateral: 2.8 cm

## 2023-04-30 MED ORDER — PERFLUTREN LIPID MICROSPHERE
1.0000 mL | INTRAVENOUS | Status: AC | PRN
  Administered 2023-04-30: 4 mL via INTRAVENOUS

## 2023-04-30 NOTE — Progress Notes (Signed)
*  PRELIMINARY RESULTS* Echocardiogram 2D Echocardiogram has been performed with Definity.  Stacey Drain 04/30/2023, 4:20 PM

## 2023-05-01 ENCOUNTER — Telehealth: Payer: Self-pay

## 2023-05-01 NOTE — Telephone Encounter (Signed)
-----   Message from Marjo Bicker, MD sent at 05/01/2023 11:12 AM EDT ----- TSH is significantly elevated at 37.257 (Normal should be between 0.35 and 4.5). Discontinue amiodarone and follow-up with PCP for hypothyroidism management.

## 2023-05-01 NOTE — Telephone Encounter (Signed)
-----   Message from Marjo Bicker, MD sent at 05/01/2023 11:13 AM EDT ----- Heart pumping function improved from 45 to 50% in 2021 to 60-65% (normal) now.  There was mild to moderate MR in 2021 but no MR was evident on the current echo.

## 2023-05-01 NOTE — Telephone Encounter (Signed)
Patient notified and verbalized understanding. Patient had no questions or concern at this time.

## 2023-05-04 ENCOUNTER — Encounter: Payer: Self-pay | Admitting: Family Medicine

## 2023-05-04 ENCOUNTER — Ambulatory Visit (INDEPENDENT_AMBULATORY_CARE_PROVIDER_SITE_OTHER): Payer: HMO | Admitting: Family Medicine

## 2023-05-04 VITALS — BP 132/80 | HR 69 | Ht 66.0 in | Wt 345.2 lb

## 2023-05-04 DIAGNOSIS — Z13820 Encounter for screening for osteoporosis: Secondary | ICD-10-CM | POA: Diagnosis not present

## 2023-05-04 DIAGNOSIS — E1169 Type 2 diabetes mellitus with other specified complication: Secondary | ICD-10-CM | POA: Diagnosis not present

## 2023-05-04 DIAGNOSIS — E559 Vitamin D deficiency, unspecified: Secondary | ICD-10-CM | POA: Diagnosis not present

## 2023-05-04 DIAGNOSIS — Z6841 Body Mass Index (BMI) 40.0 and over, adult: Secondary | ICD-10-CM | POA: Diagnosis not present

## 2023-05-04 DIAGNOSIS — E1122 Type 2 diabetes mellitus with diabetic chronic kidney disease: Secondary | ICD-10-CM | POA: Diagnosis not present

## 2023-05-04 DIAGNOSIS — N184 Chronic kidney disease, stage 4 (severe): Secondary | ICD-10-CM | POA: Diagnosis not present

## 2023-05-04 DIAGNOSIS — Z23 Encounter for immunization: Secondary | ICD-10-CM | POA: Diagnosis not present

## 2023-05-04 DIAGNOSIS — Z7984 Long term (current) use of oral hypoglycemic drugs: Secondary | ICD-10-CM

## 2023-05-04 DIAGNOSIS — Z1159 Encounter for screening for other viral diseases: Secondary | ICD-10-CM

## 2023-05-04 DIAGNOSIS — E785 Hyperlipidemia, unspecified: Secondary | ICD-10-CM

## 2023-05-04 DIAGNOSIS — E032 Hypothyroidism due to medicaments and other exogenous substances: Secondary | ICD-10-CM

## 2023-05-04 NOTE — Assessment & Plan Note (Signed)
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

## 2023-05-04 NOTE — Progress Notes (Signed)
Established Patient Office Visit  Subjective:  Patient ID: Isabella Hall, female    DOB: [birth year]  Age: 67 y.o. MRN: [id number]  CC:  Chief Complaint  Patient presents with   Thyroid Problem    Pt would like to discuss thyroid, most recent lab was 04/30/2023.      HPI Isabella Hall is a 67 y.o. female with past medical history of hypothyroidism, hyperlipidemia, and type 2 diabetes presents for f/u of  chronic medical conditions. For the details of today's visit, please refer to the assessment and plan.     Past Medical History:  Diagnosis Date   Atrial fibrillation (HCC)     CHADSVASC score 4   CKD (chronic kidney disease)    Essential hypertension    Hypothyroidism    Obesity    PONV (postoperative nausea and vomiting)    Type 2 diabetes, diet controlled (HCC)    Typical atrial flutter (HCC) 09/2020    Past Surgical History:  Procedure Laterality Date   APPENDECTOMY  1979   ATRIAL FIBRILLATION ABLATION  03/26/2018   ATRIAL FIBRILLATION ABLATION N/A 03/26/2018   Procedure: ATRIAL FIBRILLATION ABLATION;  Surgeon: Hillis Range, MD;  Location: [facility] LAB;  Service: Cardiovascular;  Laterality: N/A;   CARDIOVERSION N/A 01/25/2015   Procedure: CARDIOVERSION;  Surgeon: Jonelle Sidle, MD;  Location: [facility] ORS;  Service: Cardiovascular;  Laterality: N/A;   CARDIOVERSION N/A 10/28/2020   Procedure: CARDIOVERSION;  Surgeon: Jonelle Sidle, MD;  Location: [facility]O [facility];  Service: Cardiovascular;  Laterality: N/A;   CARDIOVERSION N/A 06/10/2021   Procedure: CARDIOVERSION;  Surgeon: Chilton Si, MD;  Location: [facility] ENDOSCOPY;  Service: Cardiovascular;  Laterality: N/A;   CARDIOVERSION N/A 09/15/2022   Procedure: CARDIOVERSION;  Surgeon: Christell Constant, MD;  Location: [facility];  Service: Cardiovascular;  Laterality: N/A;   CHOLECYSTECTOMY OPEN  1979   COLONOSCOPY WITH PROPOFOL N/A 06/16/2022   Procedure: COLONOSCOPY WITH PROPOFOL;  Surgeon: Lanelle Bal,  DO;  Location: [facility];  Service: Endoscopy;  Laterality: N/A;  3:00pm   LEFT HEART CATHETERIZATION WITH CORONARY ANGIOGRAM N/A 01/27/2015   Procedure: LEFT HEART CATHETERIZATION WITH CORONARY ANGIOGRAM;  Surgeon: Lesleigh Noe, MD;  Location: [facility] CATH LAB;  Service: Cardiovascular;  Laterality: N/A;   TEE WITHOUT CARDIOVERSION N/A 01/25/2015   Procedure: TRANSESOPHAGEAL ECHOCARDIOGRAM (TEE);  Surgeon: Jonelle Sidle, MD;  Location: [facility];  Service: Cardiovascular;  Laterality: N/A;   TUBAL LIGATION  1980    Family History  Problem Relation Age of Onset   Cancer Mother    Colon cancer Mother        in 10s   Diabetes type II Mother    Heart disease Father    Coronary artery disease Sister        died of an MI in her 45's   Heart disease Sister        dies at age 85   Diabetes type II Sister    Kidney disease Sister    Cancer Maternal Grandmother        ?leukemia   Hypertension Other    Leukemia Nephew        30    Social History   Socioeconomic History   Marital status: Divorced    Spouse name: Not on file   Number of children: 2   Years of education: Not on file   Highest education level: Not on file  Occupational History   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 .   Social Determinants of Health   Financial Resource Strain: Not on file  Food Insecurity: Not on file  Transportation Needs: Not on file  Physical Activity: Not on file  Stress: Not on file  Social Connections: Not on file  Intimate Partner  Violence: Not on file    Outpatient Medications Prior to Visit  Medication Sig Dispense Refill   atorvastatin (LIPITOR) 10 MG tablet Take 1 tablet (10 mg total) by mouth daily. 90 tablet 3   blood glucose meter kit and supplies KIT Dispense based on patient and insurance preference. Use up to four times daily as directed. (FOR ICD-9 250.00, 250.01). 1 each 0   Capsicum, Cayenne, (CAYENNE PO) Take 1 Scoop by mouth daily.      carvedilol (COREG) 25 MG tablet TAKE 1 TABLET BY MOUTH TWICE DAILY WITH A MEAL 180 tablet 2   cholecalciferol (VITAMIN D3) 25 MCG (1000 UNIT) tablet Take 1,000 Units by mouth daily.     CINNAMON PO Take 1 capsule by mouth daily.     dapagliflozin propanediol (FARXIGA) 5 MG TABS tablet Take 5 mg by mouth daily.     diltiazem (CARDIZEM SR) 120 MG 12 hr capsule Take 1 capsule (120 mg total) by mouth 2 (two) times daily. 90 capsule 3   GARLIC PO Take 1 tablet by mouth daily.     Ginger, Zingiber officinalis, (GINGER EXTRACT PO) Take 1 tablet by mouth daily.      glucose blood (ACCU-CHEK AVIVA PLUS) test strip Check your blood glucose daily 100 each 12   levothyroxine (SYNTHROID) 150 MCG tablet Take 1 tablet (150 mcg total) by mouth daily. 90 tablet 3   losartan (COZAAR) 50 MG tablet Take 1 tablet (50 mg total) by mouth daily. 90 tablet 1   Multiple Vitamin (MULTIVITAMIN WITH MINERALS) TABS tablet Take 1 tablet by mouth daily.      Multiple Vitamins-Minerals (EMERGEN-C IMMUNE PO) Take 1 tablet by mouth daily.      rivaroxaban (XARELTO) 20 MG TABS tablet Take 1 tablet (20 mg total) by mouth daily with supper. 30 tablet 6   No facility-administered medications prior to visit.    No Known Allergies  ROS Review of Systems  Constitutional:  Negative for chills, fatigue and fever.  Eyes:  Negative for pain, itching and visual disturbance.  Respiratory:  Negative for chest tightness and shortness of breath.   Cardiovascular:  Negative for chest pain and palpitations.   Gastrointestinal:  Negative for blood in stool, constipation, nausea and rectal pain.  Endocrine: Negative for cold intolerance.  Genitourinary:  Negative for urgency, vaginal discharge and vaginal pain.  Skin:  Negative for rash and wound.  Neurological:  Negative for dizziness, light-headedness and headaches.  Psychiatric/Behavioral:  Negative for sleep disturbance and suicidal ideas.       Objective:    Physical Exam HENT:     Head: Normocephalic.     Mouth/Throat:     Mouth: Mucous  membranes are moist.  Cardiovascular:     Rate and Rhythm: Normal rate.     Heart sounds: Normal heart sounds.  Pulmonary:     Effort: Pulmonary effort is normal.     Breath sounds: Normal breath sounds.  Neurological:     Mental Status: She is alert.     BP 132/80   Pulse 69   Ht 5\' 6"  (1.676 m)   Wt (!) 345 lb 3.2 oz (156.6 kg)   SpO2 94%   BMI 55.72 kg/m  Wt Readings from Last 3 Encounters:  05/04/23 (!) 345 lb 3.2 oz (156.6 kg)  03/29/23 (!) 347 lb (157.4 kg)  10/24/22 (!) 337 lb 1.9 oz (152.9 kg)    Lab Results  Component Value Date   TSH 37.257 (H) 04/30/2023   Lab Results  Component Value Date   WBC 8.6 09/05/2022   HGB 12.2 09/05/2022   HCT 37.8 09/05/2022   MCV 86.7 09/05/2022   PLT 257 09/05/2022   Lab Results  Component Value Date   NA 138 09/05/2022   K 4.1 09/05/2022   CO2 27 09/05/2022   GLUCOSE 181 (H) 09/05/2022   BUN 18 09/05/2022   CREATININE 1.95 (H) 09/05/2022   BILITOT 1.0 09/05/2022   ALKPHOS 41 09/05/2022   AST 27 09/05/2022   ALT 22 09/05/2022   PROT 7.0 09/05/2022   ALBUMIN 2.7 (L) 09/05/2022   CALCIUM 9.3 09/05/2022   ANIONGAP 11 09/05/2022   GFR 37.19 (L) 03/03/2015   Lab Results  Component Value Date   CHOL 231 (H) 04/06/2022   Lab Results  Component Value Date   HDL 60 04/06/2022   Lab Results  Component Value Date   LDLCALC 149 (H) 04/06/2022   Lab Results  Component Value Date   TRIG 106 04/06/2022   Lab Results   Component Value Date   CHOLHDL 3.9 04/06/2022   Lab Results  Component Value Date   HGBA1C 7.5 (H) 11/30/2020      Assessment & Plan:  Class 3 severe obesity due to excess calories with serious comorbidity and body mass index (BMI) of 50.0 to 59.9 in adult [facility]) Assessment & Plan: Wt Readings from Last 3 Encounters:  05/04/23 (!) 345 lb 3.2 oz (156.6 kg)  03/29/23 (!) 347 lb (157.4 kg)  10/24/22 (!) 337 lb 1.9 oz (152.9 kg)  The patient has lost 2 pounds with lifestyle changes Encouraged to continue on a heart healthy diet, increasing her intake of fruits and vegetables, lean meat, low dairy products and decreasing her intake of trans and saturated fats    Hypothyroidism due to medication Assessment & Plan: Recent TSH severely elevated 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 Patient reports that her amiodarone was discontinued by her cardiologist on Thursday, 05/03/2023 Patient is asymptomatic in the clinic today Encouraged to continue on Synthroid 150 mcg daily on empty stomach Will assess her thyroid levels in 6 weeks   Type 2 diabetes mellitus with stage 4 chronic kidney disease, without long-term current use of insulin (HCC) Assessment & Plan: She denies polyuria, polydipsia, polyphagia Last hemoglobin A1c 7.5 on 11/30/2020 Will assess hemoglobin A1c levels today   Orders: -     Microalbumin / creatinine urine ratio -     Hemoglobin A1c  Hyperlipidemia associated with type 2 diabetes mellitus (HCC) -     CMP14+EGFR -     CBC with Differential/Platelet -     Lipid panel  Osteoporosis screening -     DG Bone  Density  Immunization due -     Pneumococcal conjugate vaccine 20-valent  Vitamin D deficiency -     VITAMIN D 25 Hydroxy (Vit-D Deficiency, Fractures)  Encounter for hepatitis C screening test for low risk patient -     Hepatitis C antibody    Follow-up: Return in about 3 months (around 08/04/2023).   Gilmore Laroche, FNP

## 2023-05-04 NOTE — Assessment & Plan Note (Signed)
Wt Readings from Last 3 Encounters:  05/04/23 (!) 345 lb 3.2 oz (156.6 kg)  03/29/23 (!) 347 lb (157.4 kg)  10/24/22 (!) 337 lb 1.9 oz (152.9 kg)  The patient has lost 2 pounds with lifestyle changes Encouraged to continue on a heart healthy diet, increasing her intake of fruits and vegetables, lean meat, low dairy products and decreasing her intake of trans and saturated fats

## 2023-05-04 NOTE — Assessment & Plan Note (Signed)
She denies polyuria, polydipsia, polyphagia Last hemoglobin A1c 7.5 on 11/30/2020 Will assess hemoglobin A1c levels today

## 2023-05-04 NOTE — Patient Instructions (Addendum)
I appreciate the opportunity to provide care to you today!    Follow up:  3 months  Labs: please stop by the lab in 6 weeks  ( June 07, 2023) to get your blood drawn (CBC, CMP, TSH, Lipid profile, HgA1c, Vit D)  Please schedule Medicare annual wellness exam.   Please schedule EYE EXAM  Please schedule Pap with your OBGYN.    Dietary modification Rich in fruits, vegetables, whole grains Lean proteins: chicken, fish, beans, legumes Low Fat dairy products Reduced intake of saturated fats, trans fatty acids, cholesterol   Physical activity helps: Lower your blood glucose, improve your heart health, lower your blood pressure and cholesterol, burn calories to help manage her weight, gave you energy, lower stress, and improve his sleep.  The American diabetes Association (ADA) recommends being active for 2-1/2 hours (150 minutes) or more week.  Exercise for 30 minutes, 5 days a week (150 minutes total)        It was a pleasure to see you and I look forward to continuing to work together on your health and well-being. Please do not hesitate to call the office if you need care or have questions about your care.   Have a wonderful day and week. With Gratitude, Gilmore Laroche MSN, FNP-BC

## 2023-05-10 ENCOUNTER — Ambulatory Visit (INDEPENDENT_AMBULATORY_CARE_PROVIDER_SITE_OTHER): Payer: HMO

## 2023-05-10 DIAGNOSIS — Z0000 Encounter for general adult medical examination without abnormal findings: Secondary | ICD-10-CM | POA: Diagnosis not present

## 2023-05-10 NOTE — Progress Notes (Signed)
Subjective:   Isabella Hall is a 67 y.o. female who presents for an Initial Medicare Annual Wellness Visit.  Review of Systems    I connected with  Knute Neu on 05/10/23 by a audio enabled telemedicine application and verified that I am speaking with the correct person using two identifiers.  Patient Location: Home  Provider Location: Office/Clinic  I discussed the limitations of evaluation and management by telemedicine. The patient expressed understanding and agreed to proceed.        Objective:    There were no vitals filed for this visit. There is no height or weight on file to calculate BMI.     06/13/2022    8:45 AM 06/10/2021    9:41 AM 11/30/2020    2:00 PM 10/26/2020    3:02 PM 03/26/2018    6:32 AM 08/06/2017    6:57 PM 08/06/2017   10:08 AM  Advanced Directives  Does Patient Have a Medical Advance Directive? No No No No No No No  Would patient like information on creating a medical advance directive? No - Patient declined No - Patient declined No - Patient declined No - Patient declined No - Patient declined No - Patient declined     Current Medications (verified) Outpatient Encounter Medications as of 05/10/2023  Medication Sig   atorvastatin (LIPITOR) 10 MG tablet Take 1 tablet (10 mg total) by mouth daily.   blood glucose meter kit and supplies KIT Dispense based on patient and insurance preference. Use up to four times daily as directed. (FOR ICD-9 250.00, 250.01).   Capsicum, Cayenne, (CAYENNE PO) Take 1 Scoop by mouth daily.    carvedilol (COREG) 25 MG tablet TAKE 1 TABLET BY MOUTH TWICE DAILY WITH A MEAL   cholecalciferol (VITAMIN D3) 25 MCG (1000 UNIT) tablet Take 1,000 Units by mouth daily.   CINNAMON PO Take 1 capsule by mouth daily.   dapagliflozin propanediol (FARXIGA) 5 MG TABS tablet Take 5 mg by mouth daily.   diltiazem (CARDIZEM SR) 120 MG 12 hr capsule Take 1 capsule (120 mg total) by mouth 2 (two) times daily.   GARLIC PO Take 1 tablet by  mouth daily.   Ginger, Zingiber officinalis, (GINGER EXTRACT PO) Take 1 tablet by mouth daily.    glucose blood (ACCU-CHEK AVIVA PLUS) test strip Check your blood glucose daily   levothyroxine (SYNTHROID) 150 MCG tablet Take 1 tablet (150 mcg total) by mouth daily.   losartan (COZAAR) 50 MG tablet Take 1 tablet (50 mg total) by mouth daily.   Multiple Vitamin (MULTIVITAMIN WITH MINERALS) TABS tablet Take 1 tablet by mouth daily.    Multiple Vitamins-Minerals (EMERGEN-C IMMUNE PO) Take 1 tablet by mouth daily.    rivaroxaban (XARELTO) 20 MG TABS tablet Take 1 tablet (20 mg total) by mouth daily with supper.   No facility-administered encounter medications on file as of 05/10/2023.    Allergies (verified) Patient has no known allergies.   History: Past Medical History:  Diagnosis Date   Atrial fibrillation (HCC)     CHADSVASC score 4   CKD (chronic kidney disease)    Essential hypertension    Hypothyroidism    Obesity    PONV (postoperative nausea and vomiting)    Type 2 diabetes, diet controlled (HCC)    Typical atrial flutter (HCC) 09/2020   Past Surgical History:  Procedure Laterality Date   APPENDECTOMY  1979   ATRIAL FIBRILLATION ABLATION  03/26/2018   ATRIAL FIBRILLATION ABLATION N/A 03/26/2018  Procedure: ATRIAL FIBRILLATION ABLATION;  Surgeon: Hillis Range, MD;  Location: [facility]AS[facility];  Service: Cardiovascular;  Laterality: N/A;   CARDIOVERSION N/A 01/25/2015   Procedure: CARDIOVERSION;  Surgeon: Jonelle Sidle, MD;  Location: [facility] ORS;  Service: Cardiovascular;  Laterality: N/A;   CARDIOVERSION N/A 10/28/2020   Procedure: CARDIOVERSION;  Surgeon: Jonelle Sidle, MD;  Location: [facility] ENDO SU[facility];  Service: Cardiovascular;  Laterality: N/A;   CARDIOVERSION N/A 06/10/2021   Procedure: CARDIOVERSION;  Surgeon: Chilton Si, MD;  Location: [facility] ENDOSCOPY;  Service: Cardiovascular;  Laterality: N/A;   CARDIOVERSION N/A 09/15/2022   Procedure: CARDIOVERSION;  Surgeon:  Christell Constant, MD;  Location: [facility] ORS;  Service: Cardiovascular;  Laterality: N/A;   CHOLECYSTECTOMY OPEN  1979   COLONOSCOPY WITH PROPOFOL N/A 06/16/2022   Procedure: COLONOSCOPY WITH PROPOFOL;  Surgeon: Lanelle Bal, DO;  Location: [facility];  Service: Endoscopy;  Laterality: N/A;  3:00pm   LEFT HEART CATHETERIZATION WITH CORONARY ANGIOGRAM N/A 01/27/2015   Procedure: LEFT HEART CATHETERIZATION WITH CORONARY ANGIOGRAM;  Surgeon: Lesleigh Noe, MD;  Location: [facility] CATH LAB;  Service: Cardiovascular;  Laterality: N/A;   TEE WITHOUT CARDIOVERSION N/A 01/25/2015   Procedure: TRANSESOPHAGEAL ECHOCARDIOGRAM (TEE);  Surgeon: Jonelle Sidle, MD;  Location: [facility] ORS;  Service: Cardiovascular;  Laterality: N/A;   TUBAL LIGATION  1980   Family History  Problem Relation Age of Onset   Cancer Mother    Colon cancer Mother        in 16s   Diabetes type II Mother    Heart disease Father    Coronary artery disease Sister        died of an MI in her 88's   Heart disease Sister        dies at age 65   Diabetes type II Sister    Kidney disease Sister    Cancer Maternal Grandmother        ?leukemia   Hypertension Other    Leukemia Nephew        30   Social History   Socioeconomic History   Marital status: Divorced    Spouse name: Not on file   Number of children: 2   Years of education: Not on file   Highest education level: Not on file  Occupational History   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 .   Social Determinants of  Health   Financial Resource Strain: Not on file  Food Insecurity: Not on file  Transportation Needs: Not on file  Physical Activity: Not on file  Stress: Not on file  Social Connections: Not on file    Tobacco Counseling Counseling given: Not Answered   Clinical Intake:                 Diabetic?No         Activities of Daily Living    06/13/2022    8:44 AM  In your present state of health, do you have  any difficulty performing the following activities:  Hearing? 0  Vision? 0  Difficulty concentrating or making decisions? 0  Walking or climbing stairs? 0  Dressing or bathing? 0    Patient Care Team: Gilmore Laroche, FNP as PCP - General (Family Medicine) Hillis Range, MD (Inactive) as PCP - Electrophysiology (Cardiology)  Indicate any recent Medical Services you may have received from other than [facility] providers in the past year (date may be approximate).     Assessment:   This is a routine wellness examination for [place]on.  Hearing/Vision screen No results found.  Dietary issues and exercise activities discussed:     Goals Addressed   None   Depression Screen    05/04/2023   10:40 AM 10/24/2022    3:08 PM 04/21/2022    3:13 PM 02/17/2022    3:13 PM  PHQ 2/9 Scores  PHQ - 2 Score 0 0 0 0  PHQ- 9 Score 2 2      Fall Risk    05/04/2023   10:39 AM 10/24/2022    3:08 PM 04/21/2022    3:13 PM 02/17/2022    3:13 PM  Fall Risk   Falls in the past year? 0 0 0 0  Number falls in past yr: 0 0 0 0  Injury with Fall? 0 0 0 0  Risk for fall due to : No Fall Risks No Fall Risks No Fall Risks No Fall Risks  Follow up Falls evaluation completed Falls evaluation completed Falls evaluation completed Falls evaluation completed    FALL RISK PREVENTION PERTAINING TO THE HOME:  Any stairs in or around the home? No  If so, are there any without handrails? No  Home free of loose throw rugs in walkways, pet beds, electrical cords, etc? Yes  Adequate lighting in  your home to reduce risk of falls? Yes   ASSISTIVE DEVICES UTILIZED TO PREVENT FALLS:  Life alert? No  Use of a cane, walker or w/c? No  Grab bars in the bathroom? No  Shower chair or bench in shower? No  Elevated toilet seat or a handicapped toilet? No   TIMED UP AND GO:  Was the test performed? No .  Length of time to ambulate 10 feet:  sec.     Cognitive Function:        Immunizations Immunization History  Administered Date(s) Administered   Influenza,inj,Quad PF,6+ Mos 09/20/2018, 11/05/2019, 10/24/2022   Influenza-Unspecified 11/05/2019   Moderna SARS-COV2 Booster Vaccination 01/06/2021   Moderna Sars-Covid-2 Vaccination 01/12/2020, 02/09/2020   Pneumococcal Polysaccharide-23 01/21/2015   Tdap 05/01/2022   Zoster Recombinat (Shingrix) 11/05/2019, 05/01/2022    TDAP status: Up to date  Flu Vaccine status: Up to date  Pneumococcal vaccine status: Due, Education has been provided regarding the importance of this vaccine. Advised may receive this vaccine at local pharmacy or Health Dept. Aware to provide a copy of the vaccination record if obtained from local pharmacy or Health Dept. Verbalized acceptance and understanding.  Covid-19 vaccine status: Completed vaccines  Qualifies for Shingles Vaccine? Yes   Zostavax completed Yes   Shingrix Completed?: Yes  Screening Tests Health Maintenance  Topic Date Due   OPHTHALMOLOGY EXAM  Never done   Diabetic kidney evaluation - Urine ACR  Never done   Hepatitis C Screening  Never done   PAP SMEAR-Modifier  Never done   COVID-19 Vaccine (3 - Moderna risk series) 02/03/2021   HEMOGLOBIN A1C  05/31/2021   Pneumonia Vaccine 46+ Years old (2 of 2 -  PCV) 04/02/2023   DEXA SCAN  Never done   INFLUENZA VACCINE  07/19/2023   Diabetic kidney evaluation - eGFR measurement  09/06/2023   FOOT EXAM  10/25/2023   MAMMOGRAM  03/01/2024   Medicare Annual Wellness (AWV)  05/09/2024   DTaP/Tdap/Td (2 - Td or Tdap) 05/01/2032    Colonoscopy  06/16/2032   HIV Screening  Completed   Zoster Vaccines- Shingrix  Completed   HPV VACCINES  Aged Out    Health Maintenance  Health Maintenance Due  Topic Date Due   OPHTHALMOLOGY EXAM  Never done   Diabetic kidney evaluation - Urine ACR  Never done   Hepatitis C Screening  Never done   PAP SMEAR-Modifier  Never done   COVID-19 Vaccine (3 - Moderna risk series) 02/03/2021   HEMOGLOBIN A1C  05/31/2021   Pneumonia Vaccine 56+ Years old (2 of 2 - PCV) 04/02/2023   DEXA SCAN  Never done    Colorectal cancer screening: Type of screening: Colonoscopy. Completed 06/16/2022. Repeat every 10 years  Mammogram status: Completed 03/01/2022. Repeat every year  Bone Density status: Ordered 05/04/2023. Pt provided with contact info and advised to call to schedule appt.  Lung Cancer Screening: (Low Dose CT Chest recommended if Age 60-80 years, 30 pack-year currently smoking OR have quit w/in 15years.) does not qualify.   Lung Cancer Screening Referral:   Additional Screening:  Hepatitis C Screening: does qualify; Completed   Vision Screening: Recommended annual ophthalmology exams for early detection of glaucoma and other disorders of the eye. Is the patient up to date with their annual eye exam?  No  Who is the provider or what is the name of the office in which the patient attends annual eye exams? N/A If pt is not established with a provider, would they like to be referred to a provider to establish care? No .   Dental Screening: Recommended annual dental exams for proper oral hygiene  Community Resource Referral / Chronic Care Management: CRR required this visit?  No   CCM required this visit?  No      Plan:     I have personally reviewed and noted the following in the patient's chart:   Medical and social history Use of alcohol, tobacco or illicit drugs  Current medications and supplements including opioid prescriptions. Patient is not currently taking opioid  prescriptions. Functional ability and status Nutritional status Physical activity Advanced directives List of other physicians Hospitalizations, surgeries, and ER visits in previous 12 months Vitals Screenings to include cognitive, depression, and falls Referrals and appointments  In addition, I have reviewed and discussed with patient certain preventive protocols, quality metrics, and best practice recommendations. A written personalized care plan for preventive services as well as general preventive health recommendations were provided to patient.     Jacklynn Barnacle, CMA   05/10/2023   Nurse Notes:  Ms. Volmar , Thank you for taking time to come for your Medicare Wellness Visit. I appreciate your ongoing commitment to your health goals. Please review the following plan we discussed and let me know if I can assist you in the future.   These are the goals we discussed:  Goals   None     This is a list of the screening recommended for you and due dates:  Health Maintenance  Topic Date Due   Eye exam for diabetics  Never done   Yearly kidney health urinalysis for diabetes  Never done   Hepatitis C Screening  Never done   Pap Smear  Never done   COVID-19 Vaccine (3 - Moderna risk series) 02/03/2021   Hemoglobin A1C  05/31/2021   Pneumonia Vaccine (2 of 2 - PCV) 04/02/2023   DEXA scan (bone density measurement)  Never done   Flu Shot  07/19/2023   Yearly kidney function blood test for diabetes  09/06/2023   Complete foot exam   10/25/2023   Mammogram  03/01/2024   Medicare Annual Wellness Visit  05/09/2024   DTaP/Tdap/Td vaccine (2 - Td or Tdap) 05/01/2032   Colon Cancer Screening  06/16/2032   HIV Screening  Completed   Zoster (Shingles) Vaccine  Completed   HPV Vaccine  Aged Out

## 2023-05-16 ENCOUNTER — Ambulatory Visit: Payer: HMO

## 2023-05-16 LAB — HM DIABETES EYE EXAM

## 2023-06-07 ENCOUNTER — Other Ambulatory Visit (HOSPITAL_COMMUNITY)
Admission: RE | Admit: 2023-06-07 | Discharge: 2023-06-07 | Disposition: A | Discharge status: Home / Self Care | Payer: HMO | Source: Ambulatory Visit | Attending: Family Medicine | Admitting: Family Medicine

## 2023-06-07 DIAGNOSIS — E559 Vitamin D deficiency, unspecified: Secondary | ICD-10-CM | POA: Insufficient documentation

## 2023-06-07 DIAGNOSIS — E785 Hyperlipidemia, unspecified: Secondary | ICD-10-CM | POA: Insufficient documentation

## 2023-06-07 DIAGNOSIS — E1122 Type 2 diabetes mellitus with diabetic chronic kidney disease: Secondary | ICD-10-CM | POA: Insufficient documentation

## 2023-06-07 DIAGNOSIS — E1169 Type 2 diabetes mellitus with other specified complication: Secondary | ICD-10-CM | POA: Insufficient documentation

## 2023-06-07 DIAGNOSIS — Z1159 Encounter for screening for other viral diseases: Secondary | ICD-10-CM | POA: Diagnosis not present

## 2023-06-07 DIAGNOSIS — N184 Chronic kidney disease, stage 4 (severe): Secondary | ICD-10-CM | POA: Diagnosis not present

## 2023-06-07 LAB — CBC WITH DIFFERENTIAL/PLATELET
Abs Immature Granulocytes: 0.03 10*3/uL (ref 0.00–0.07)
Basophils Absolute: 0 10*3/uL (ref 0.0–0.1)
Basophils Relative: 1 %
Eosinophils Absolute: 0.2 10*3/uL (ref 0.0–0.5)
Eosinophils Relative: 4 %
HCT: 37 % (ref 36.0–46.0)
Hemoglobin: 12 g/dL (ref 12.0–15.0)
Immature Granulocytes: 1 %
Lymphocytes Relative: 26 %
Lymphs Abs: 1.4 10*3/uL (ref 0.7–4.0)
MCH: 28 pg (ref 26.0–34.0)
MCHC: 32.4 g/dL (ref 30.0–36.0)
MCV: 86.2 fL (ref 80.0–100.0)
Monocytes Absolute: 0.4 10*3/uL (ref 0.1–1.0)
Monocytes Relative: 7 %
Neutro Abs: 3.3 10*3/uL (ref 1.7–7.7)
Neutrophils Relative %: 61 %
Platelets: 149 10*3/uL — ABNORMAL LOW (ref 150–400)
RBC: 4.29 MIL/uL (ref 3.87–5.11)
RDW: 15.8 % — ABNORMAL HIGH (ref 11.5–15.5)
WBC: 5.5 10*3/uL (ref 4.0–10.5)
nRBC: 0 % (ref 0.0–0.2)

## 2023-06-07 LAB — VITAMIN D 25 HYDROXY (VIT D DEFICIENCY, FRACTURES): Vit D, 25-Hydroxy: 38.15 ng/mL (ref 30–100)

## 2023-06-07 LAB — COMPREHENSIVE METABOLIC PANEL
ALT: 26 U/L (ref 0–44)
AST: 26 U/L (ref 15–41)
Albumin: 3.5 g/dL (ref 3.5–5.0)
Alkaline Phosphatase: 55 U/L (ref 38–126)
Anion gap: 11 (ref 5–15)
BUN: 23 mg/dL (ref 8–23)
CO2: 24 mmol/L (ref 22–32)
Calcium: 9.1 mg/dL (ref 8.9–10.3)
Chloride: 100 mmol/L (ref 98–111)
Creatinine, Ser: 1.85 mg/dL — ABNORMAL HIGH (ref 0.44–1.00)
GFR, Estimated: 30 mL/min — ABNORMAL LOW (ref >60)
Glucose, Bld: 185 mg/dL — ABNORMAL HIGH (ref 70–99)
Potassium: 4.1 mmol/L (ref 3.5–5.1)
Sodium: 135 mmol/L (ref 135–145)
Total Bilirubin: 0.9 mg/dL (ref 0.3–1.2)
Total Protein: 7.4 g/dL (ref 6.5–8.1)

## 2023-06-07 LAB — HEMOGLOBIN A1C
Hgb A1c MFr Bld: 8 % — ABNORMAL HIGH (ref 4.8–5.6)
Mean Plasma Glucose: 182.9 mg/dL

## 2023-06-07 LAB — LIPID PANEL
Cholesterol: 247 mg/dL — ABNORMAL HIGH (ref 0–200)
HDL: 71 mg/dL (ref >40)
LDL Cholesterol: 154 mg/dL — ABNORMAL HIGH (ref 0–99)
Total CHOL/HDL Ratio: 3.5 RATIO
Triglycerides: 109 mg/dL (ref <150)
VLDL: 22 mg/dL (ref 0–40)

## 2023-06-07 LAB — HEPATITIS C ANTIBODY: HCV Ab: NONREACTIVE

## 2023-06-08 LAB — MICROALBUMIN / CREATININE URINE RATIO
Creatinine, Urine: 299.1 mg/dL
Microalb Creat Ratio: 15 mg/g creat (ref 0–29)
Microalb, Ur: 44.2 ug/mL — ABNORMAL HIGH

## 2023-06-09 ENCOUNTER — Other Ambulatory Visit: Payer: Self-pay | Admitting: Family Medicine

## 2023-06-09 DIAGNOSIS — E1122 Type 2 diabetes mellitus with diabetic chronic kidney disease: Secondary | ICD-10-CM

## 2023-06-09 DIAGNOSIS — E1169 Type 2 diabetes mellitus with other specified complication: Secondary | ICD-10-CM

## 2023-06-09 MED ORDER — DAPAGLIFLOZIN PROPANEDIOL 5 MG PO TABS: 5.0000 mg | ORAL_TABLET | Freq: Every day | ORAL | 2 refills | Status: DC

## 2023-06-09 MED ORDER — GLIMEPIRIDE 4 MG PO TABS
4.0000 mg | ORAL_TABLET | Freq: Every day | ORAL | 3 refills | Status: DC
Start: 2023-06-09 — End: 2023-07-26

## 2023-06-09 MED ORDER — RYBELSUS 3 MG PO TABS
3.0000 mg | ORAL_TABLET | Freq: Every day | ORAL | 2 refills | Status: DC
Start: 2023-06-09 — End: 2023-07-26

## 2023-06-09 MED ORDER — ATORVASTATIN CALCIUM 10 MG PO TABS: 10.0000 mg | ORAL_TABLET | Freq: Every day | ORAL | 3 refills | Status: DC

## 2023-06-09 NOTE — Progress Notes (Signed)
The 10-year ASCVD risk score (Arnett DK, et al., 2019) is: 14.8%   Values used to calculate the score:     Age: 67 years     Sex: Female     Is Non-Hispanic African American: No     Diabetic: Yes     Tobacco smoker: No     Systolic Blood Pressure: 132 mmHg     Is BP treated: Yes     HDL Cholesterol: 71 mg/dL     Total Cholesterol: 247 mg/dL

## 2023-06-26 ENCOUNTER — Ambulatory Visit (HOSPITAL_COMMUNITY)
Admission: RE | Admit: 2023-06-26 | Discharge: 2023-06-26 | Disposition: A | Discharge status: Home / Self Care | Payer: HMO | Source: Ambulatory Visit | Attending: Internal Medicine | Admitting: Internal Medicine

## 2023-06-26 DIAGNOSIS — Z79899 Other long term (current) drug therapy: Secondary | ICD-10-CM | POA: Diagnosis not present

## 2023-06-26 LAB — PULMONARY FUNCTION TEST
DL/VA % pred: 124 %
DL/VA: 5.12 ml/min/mmHg/L
DLCO unc % pred: 109 %
DLCO unc: 23.08 ml/min/mmHg
FEF 25-75 Pre: 1.37 L/sec
FEF2575-%Pred-Pre: 60 %
FEV1-%Pred-Pre: 76 %
FEV1-Pre: 1.98 L
FEV1FVC-%Pred-Pre: 94 %
FEV6-%Pred-Pre: 81 %
FEV6-Pre: 2.68 L
FEV6FVC-%Pred-Pre: 102 %
FVC-%Pred-Pre: 79 %
FVC-Pre: 2.72 L
Pre FEV1/FVC ratio: 73 %
Pre FEV6/FVC Ratio: 98 %
RV % pred: 117 %
RV: 2.58 L
TLC % pred: 105 %
TLC: 5.67 L

## 2023-06-26 MED ORDER — ALBUTEROL SULFATE (2.5 MG/3ML) 0.083% IN NEBU: 2.5000 mg | INHALATION_SOLUTION | Freq: Once | RESPIRATORY_TRACT | Status: DC

## 2023-07-02 ENCOUNTER — Encounter: Payer: Self-pay | Admitting: Internal Medicine

## 2023-07-02 ENCOUNTER — Other Ambulatory Visit (HOSPITAL_COMMUNITY)
Admission: RE | Admit: 2023-07-02 | Discharge: 2023-07-02 | Disposition: A | Discharge status: Home / Self Care | Payer: HMO | Source: Ambulatory Visit | Attending: Internal Medicine | Admitting: Internal Medicine

## 2023-07-02 ENCOUNTER — Ambulatory Visit: Payer: HMO | Attending: Internal Medicine | Admitting: Internal Medicine

## 2023-07-02 VITALS — BP 156/92 | HR 67 | Ht 66.0 in | Wt 350.2 lb

## 2023-07-02 DIAGNOSIS — E0590 Thyrotoxicosis, unspecified without thyrotoxic crisis or storm: Secondary | ICD-10-CM | POA: Diagnosis not present

## 2023-07-02 DIAGNOSIS — E6609 Other obesity due to excess calories: Secondary | ICD-10-CM

## 2023-07-02 DIAGNOSIS — E039 Hypothyroidism, unspecified: Secondary | ICD-10-CM

## 2023-07-02 DIAGNOSIS — I4819 Other persistent atrial fibrillation: Secondary | ICD-10-CM | POA: Diagnosis not present

## 2023-07-02 LAB — TSH: TSH: 13.795 u[IU]/mL — ABNORMAL HIGH (ref 0.350–4.500)

## 2023-07-02 NOTE — Patient Instructions (Signed)
Medication Instructions:  Your physician recommends that you continue on your current medications as directed. Please refer to the Current Medication list given to you today.  *If you need a refill on your cardiac medications before your next appointment, please call your pharmacy*   Lab Work: TSH- Hyperthyroidism   If you have labs (blood work) drawn today and your tests are completely normal, you will receive your results only by: MyChart Message (if you have MyChart) OR A paper copy in the mail If you have any lab test that is abnormal or we need to change your treatment, we will call you to review the results.   Testing/Procedures: None   Follow-Up: At [facility], you and your health needs are our priority.  As part of our continuing mission to provide you with exceptional heart care, we have created designated Provider Care Teams.  These Care Teams include your primary Cardiologist (physician) and Advanced Practice Providers (APPs -  Physician Assistants and Nurse Practitioners) who all work together to provide you with the care you need, when you need it.  We recommend signing up for the patient portal called "MyChart".  Sign up information is provided on this After Visit Summary.  MyChart is used to connect with patients for Virtual Visits (Telemedicine).  Patients are able to view lab/test results, encounter notes, upcoming appointments, etc.  Non-urgent messages can be sent to your provider as well.   To learn more about what you can do with MyChart, go to [email or web address].    Your next appointment:   6 month(s)  Provider:   Luane School, MD    Other Instructions

## 2023-07-02 NOTE — Progress Notes (Signed)
Cardiology Office Note  Date: 07/02/2023   ID: Raquelle, Pietro August 31, 1956, MRN [id number]  PCP:  Gilmore Laroche, FNP  Cardiologist:  None Electrophysiologist:  Hillis Range, MD (Inactive)   Reason for Office Visit: Follow-up of A-fib   Overall doing great.  No symptoms of angina, DOE.  Sleeps good, no orthopnea or PND.  No leg swelling.  No palpitations.  No dizziness, presyncope or syncope.  Does not want to do OSA evaluation at this time as she sleeps with her head end of the bed raised and this improved her sleep as well as snoring.EKG today showed NSR, sinus bradycardia with HR 57 bpm.  TSH 37.2 in 5/24 after which amiodarone was discontinued (she has been on amiodarone since 2016 and she failed Tikosyn therapy due to prolonged QTc interval).  Currently on levothyroxine 150 mcg.  Repeat TSH today showed current 0.7, improved significantly.  Past Medical History:  Diagnosis Date   Atrial fibrillation (HCC)     CHADSVASC score 4   CKD (chronic kidney disease)    Essential hypertension    Hypothyroidism    Obesity    PONV (postoperative nausea and vomiting)    Type 2 diabetes, diet controlled (HCC)    Typical atrial flutter (HCC) 09/2020    Past Surgical History:  Procedure Laterality Date   APPENDECTOMY  1979   ATRIAL FIBRILLATION ABLATION  03/26/2018   ATRIAL FIBRILLATION ABLATION N/A 03/26/2018   Procedure: ATRIAL FIBRILLATION ABLATION;  Surgeon: Hillis Range, MD;  Location: [facility];  Service: Cardiovascular;  Laterality: N/A;   CARDIOVERSION N/A 01/25/2015   Procedure: CARDIOVERSION;  Surgeon: Jonelle Sidle, MD;  Location: [facility] ORS;  Service: Cardiovascular;  Laterality: N/A;   CARDIOVERSION N/A 10/28/2020   Procedure: CARDIOVERSION;  Surgeon: Jonelle Sidle, MD;  Location: [facility] ENDO SU[facility];  Service: Cardiovascular;  Laterality: N/A;   CARDIOVERSION N/A 06/10/2021   Procedure: CARDIOVERSION;  Surgeon: Chilton Si, MD;  Location: [facility] ENDOSCOPY;   Service: Cardiovascular;  Laterality: N/A;   CARDIOVERSION N/A 09/15/2022   Procedure: CARDIOVERSION;  Surgeon: Christell Constant, MD;  Location: [facility] ORS;  Service: Cardiovascular;  Laterality: N/A;   CHOLECYSTECTOMY OPEN  1979   COLONOSCOPY WITH PROPOFOL N/A 06/16/2022   Procedure: COLONOSCOPY WITH PROPOFOL;  Surgeon: Lanelle Bal, DO;  Location: [facility];  Service: Endoscopy;  Laterality: N/A;  3:00pm   LEFT HEART CATHETERIZATION WITH CORONARY ANGIOGRAM N/A 01/27/2015   Procedure: LEFT HEART CATHETERIZATION WITH CORONARY ANGIOGRAM;  Surgeon: Lesleigh Noe, MD;  Location: [facility] CATH LAB;  Service: Cardiovascular;  Laterality: N/A;   TEE WITHOUT CARDIOVERSION N/A 01/25/2015   Procedure: TRANSESOPHAGEAL ECHOCARDIOGRAM (TEE);  Surgeon: Jonelle Sidle, MD;  Location: [facility] ORS;  Service: Cardiovascular;  Laterality: N/A;   TUBAL LIGATION  1980    Current Outpatient Medications  Medication Sig Dispense Refill   atorvastatin (LIPITOR) 10 MG tablet Take 1 tablet (10 mg total) by mouth daily. 90 tablet 3   blood glucose meter kit and supplies KIT Dispense based on patient and insurance preference. Use up to four times daily as directed. (FOR ICD-9 250.00, 250.01). 1 each 0   Capsicum, Cayenne, (CAYENNE PO) Take 1 Scoop by mouth daily.      carvedilol (COREG) 25 MG tablet TAKE 1 TABLET BY MOUTH TWICE DAILY WITH A MEAL 180 tablet 2   cholecalciferol (VITAMIN D3) 25 MCG (1000 UNIT) tablet Take 1,000 Units by mouth daily.     CINNAMON PO Take  1 capsule by mouth daily.     dapagliflozin propanediol (FARXIGA) 5 MG TABS tablet Take 1 tablet (5 mg total) by mouth daily. 30 tablet 2   diltiazem (CARDIZEM SR) 120 MG 12 hr capsule Take 1 capsule (120 mg total) by mouth 2 (two) times daily. 90 capsule 3   GARLIC PO Take 1 tablet by mouth daily.     Ginger, Zingiber officinalis, (GINGER EXTRACT PO) Take 1 tablet by mouth daily.      glimepiride (AMARYL) 4 MG tablet Take 1 tablet (4 mg total) by mouth  daily before breakfast. 30 tablet 3   glucose blood (ACCU-CHEK AVIVA PLUS) test strip Check your blood glucose daily 100 each 12   levothyroxine (SYNTHROID) 150 MCG tablet Take 1 tablet (150 mcg total) by mouth daily. 90 tablet 3   losartan (COZAAR) 50 MG tablet Take 1 tablet (50 mg total) by mouth daily. 90 tablet 1   Multiple Vitamin (MULTIVITAMIN WITH MINERALS) TABS tablet Take 1 tablet by mouth daily.      Multiple Vitamins-Minerals (EMERGEN-C IMMUNE PO) Take 1 tablet by mouth daily.      rivaroxaban (XARELTO) 20 MG TABS tablet Take 1 tablet (20 mg total) by mouth daily with supper. 30 tablet 6   Semaglutide (RYBELSUS) 3 MG TABS Take 1 tablet (3 mg total) by mouth daily. 30 tablet 2   No current facility-administered medications for this visit.   Allergies:  Patient has no known allergies.   Social History: The patient  reports that she has never smoked. She has never used smokeless tobacco. She reports that she does not drink alcohol and does not use drugs.   Family History: The patient's family history includes Cancer in her maternal grandmother and mother; Colon cancer in her mother; Coronary artery disease in her sister; Diabetes type II in her mother and sister; Heart disease in her father and sister; Hypertension in an other family member; Kidney disease in her sister; Leukemia in her nephew.   ROS:  Please see the history of present illness. Otherwise, complete review of systems is positive for none.  All other systems are reviewed and negative.   Physical Exam: VS:  BP (!) 156/92 (BP Location: Left Arm, Patient Position: Sitting, Cuff Size: Large)   Pulse 67   Ht 5\' 6"  (1.676 m)   Wt (!) 350 lb 3.2 oz (158.8 kg)   SpO2 95%   BMI 56.52 kg/m , BMI Body mass index is 56.52 kg/m.  Wt Readings from Last 3 Encounters:  07/02/23 (!) 350 lb 3.2 oz (158.8 kg)  05/04/23 (!) 345 lb 3.2 oz (156.6 kg)  03/29/23 (!) 347 lb (157.4 kg)    General: Patient appears comfortable at  rest. HEENT: Conjunctiva and lids normal, oropharynx clear with moist mucosa. Neck: Supple, no elevated JVP or carotid bruits, no thyromegaly. Lungs: Clear to auscultation, nonlabored breathing at rest. Cardiac: Regular rate and rhythm, no S3 or significant systolic murmur, no pericardial rub. Abdomen: Soft, nontender, no hepatomegaly, bowel sounds present, no guarding or rebound. Extremities: No pitting edema, distal pulses 2+. Skin: Warm and dry. Musculoskeletal: No kyphosis. Neuropsychiatric: Alert and oriented x3, affect grossly appropriate.  ECG:  NSR  Recent Labwork: 06/07/2023: ALT 26; AST 26; BUN 23; Creatinine, Ser 1.85; Hemoglobin 12.0; Platelets 149; Potassium 4.1; Sodium 135 07/02/2023: TSH 13.795     Component Value Date/Time   CHOL 247 (H) 06/07/2023 1529   TRIG 109 06/07/2023 1529   HDL 71 06/07/2023 1529  CHOLHDL 3.5 06/07/2023 1529   VLDL 22 06/07/2023 1529   LDLCALC 154 (H) 06/07/2023 1529   LDLCALC 149 (H) 04/06/2022 1607    Other Studies Reviewed Today:   Assessment and Plan: Patient is a 67 year old F known to have persistent A-fib s/p ablation in 2019, A-fib s/p DCCV in 08/2022 with LVEF 45 to 50%, hypothyroidism, DM 2, HTN, CKD is here for follow-up visit.  # Afib s/p ablation in 2019, Afib s/p DCCV in 08/2022 -TSH 37.2 in 5/24 after which amiodarone was discontinued. She is currently on levothyroxine 150 mcg, repeat TSH today improved to 13.7. Patient had been on amiodarone since 2016 as she failed Tikosyn due to prolonged QTc interval in the past.  PFTs from 7/24 showed mild COPD, follows with PCP.  EKG today showed normal sinus rhythm, sinus bradycardia.  HR 57 bpm.  She has yearly ophthalmology appointments. -Continue carvedilol 25 mg twice daily and takes diltiazem 120 mg twice daily. -Continue Xarelto 20 mg daily with supper -Does not want to be checked for OSA at this point as she has a lot going on. Will revisit the conversation in 6 months.  Strongly  encouraged weight loss as this will prevent future recurrence of atrial fibrillation. Will start her on Ozempic (for morbid obesity and diabetes mellitus) and discontinue p.o. form of semaglutide.  # Morbid obesity and diabetes mellitus type 2: Diet and exercise counseling provided.  Will place referral to Pharm.D. for initiation of Ozempic. When she starts Ozempic, she will need to discontinue the p.o. form of semaglutide.  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  # Mild to moderate MR in 2021 completely resolved on the current echocardiogram from 5/24.    I have spent a total of 30 minutes with patient reviewing chart, EKGs, labs and examining patient as well as establishing an assessment and plan that was discussed with the patient.  > 50% of time was spent in direct patient care.     Medication Adjustments/Labs and Tests Ordered: Current medicines are reviewed at length with the patient today.  Concerns regarding medicines are outlined above.   Tests Ordered: Orders Placed This Encounter  Procedures   TSH   AMB Referral to [facility] Pharm-D   EKG 12-Lead    Medication Changes: No orders of the defined types were placed in this encounter.   Disposition:  Follow up  3 months  Signed, Jung Ingerson Verne Spurr, MD, 07/02/2023 1:16 PM    [facility] [address], [place], [place] [address]

## 2023-07-25 ENCOUNTER — Other Ambulatory Visit: Payer: Self-pay

## 2023-07-25 ENCOUNTER — Encounter (HOSPITAL_COMMUNITY): Payer: Self-pay | Admitting: *Deleted

## 2023-07-25 DIAGNOSIS — S5001XA Contusion of right elbow, initial encounter: Secondary | ICD-10-CM | POA: Diagnosis not present

## 2023-07-25 DIAGNOSIS — Z79899 Other long term (current) drug therapy: Secondary | ICD-10-CM | POA: Diagnosis not present

## 2023-07-25 DIAGNOSIS — S60221A Contusion of right hand, initial encounter: Secondary | ICD-10-CM | POA: Insufficient documentation

## 2023-07-25 DIAGNOSIS — I10 Essential (primary) hypertension: Secondary | ICD-10-CM | POA: Insufficient documentation

## 2023-07-25 DIAGNOSIS — E119 Type 2 diabetes mellitus without complications: Secondary | ICD-10-CM | POA: Insufficient documentation

## 2023-07-25 DIAGNOSIS — W010XXA Fall on same level from slipping, tripping and stumbling without subsequent striking against object, initial encounter: Secondary | ICD-10-CM | POA: Diagnosis not present

## 2023-07-25 DIAGNOSIS — I4891 Unspecified atrial fibrillation: Secondary | ICD-10-CM | POA: Diagnosis not present

## 2023-07-25 DIAGNOSIS — Z043 Encounter for examination and observation following other accident: Secondary | ICD-10-CM | POA: Diagnosis not present

## 2023-07-25 DIAGNOSIS — S6991XA Unspecified injury of right wrist, hand and finger(s), initial encounter: Secondary | ICD-10-CM | POA: Diagnosis present

## 2023-07-25 NOTE — ED Triage Notes (Signed)
Pt in c/o tripping and falling today while at the bank landing on the carpet, pt takes Xerelto, pt c/o R thumb, R elbow pain, R lower & mid back pain, pt ambulatory, denies hitting head and denies LOC, MAE, A&O x4

## 2023-07-26 ENCOUNTER — Ambulatory Visit: Payer: HMO | Attending: Internal Medicine | Admitting: Pharmacist

## 2023-07-26 ENCOUNTER — Emergency Department (HOSPITAL_COMMUNITY): Payer: HMO

## 2023-07-26 ENCOUNTER — Emergency Department (HOSPITAL_COMMUNITY)
Admission: EM | Admit: 2023-07-26 | Discharge: 2023-07-26 | Disposition: A | Discharge status: Home / Self Care | Payer: HMO | Attending: Emergency Medicine | Admitting: Emergency Medicine

## 2023-07-26 VITALS — Ht 66.0 in | Wt 351.0 lb

## 2023-07-26 DIAGNOSIS — S5001XA Contusion of right elbow, initial encounter: Secondary | ICD-10-CM

## 2023-07-26 DIAGNOSIS — E1122 Type 2 diabetes mellitus with diabetic chronic kidney disease: Secondary | ICD-10-CM

## 2023-07-26 DIAGNOSIS — N184 Chronic kidney disease, stage 4 (severe): Secondary | ICD-10-CM | POA: Diagnosis not present

## 2023-07-26 DIAGNOSIS — Z043 Encounter for examination and observation following other accident: Secondary | ICD-10-CM | POA: Diagnosis not present

## 2023-07-26 DIAGNOSIS — S60221A Contusion of right hand, initial encounter: Secondary | ICD-10-CM

## 2023-07-26 NOTE — Discharge Instructions (Signed)
Ice for 20 minutes every 2 hours while awake for the next 2 days.  Rest.  Follow-up with primary doctor if not improving in the next week.

## 2023-07-26 NOTE — ED Provider Notes (Signed)
[place] EMERGENCY DEPARTMENT AT [facility] Provider Note   CSN: [id number] Arrival date & time: 07/25/23  1909     History  Chief Complaint  Patient presents with   Isabella Hall CHANDLAR ECKELBERRY is a 67 y.o. female.  Patient is a 67 year old female with history of hyperlipidemia, hypertension, diabetes, paroxysmal A-fib.  Patient presenting today with complaints of a fall.  She reports going to the bank today when she tripped over carpet and injured her right elbow and right hand at the base of her right thumb.  She has swelling and pain since.  No other injury.  She denies having struck her head or having loss consciousness.  The history is provided by the patient.       Home Medications Prior to Admission medications   Medication Sig Start Date End Date Taking? Authorizing Provider  atorvastatin (LIPITOR) 10 MG tablet Take 1 tablet (10 mg total) by mouth daily. 06/09/23   Gilmore Laroche, FNP  blood glucose meter kit and supplies KIT Dispense based on patient and insurance preference. Use up to four times daily as directed. (FOR ICD-9 250.00, 250.01). 08/11/17   Erick Blinks, MD  Capsicum, Cayenne, (CAYENNE PO) Take 1 Scoop by mouth daily.     [provider]  carvedilol (COREG) 25 MG tablet TAKE 1 TABLET BY MOUTH TWICE DAILY WITH A MEAL 12/01/22   Newman Nip, NP  cholecalciferol (VITAMIN D3) 25 MCG (1000 UNIT) tablet Take 1,000 Units by mouth daily.    [provider]  CINNAMON PO Take 1 capsule by mouth daily.    [provider]  dapagliflozin propanediol (FARXIGA) 5 MG TABS tablet Take 1 tablet (5 mg total) by mouth daily. 06/09/23   Gilmore Laroche, FNP  diltiazem (CARDIZEM SR) 120 MG 12 hr capsule Take 1 capsule (120 mg total) by mouth 2 (two) times daily. 03/29/23   Mallipeddi, Vishnu P, MD  GARLIC PO Take 1 tablet by mouth daily.    [provider]  Ginger, Zingiber officinalis, (GINGER EXTRACT PO) Take 1 tablet by mouth  daily.     [provider]  glimepiride (AMARYL) 4 MG tablet Take 1 tablet (4 mg total) by mouth daily before breakfast. 06/09/23   Gilmore Laroche, FNP  glucose blood (ACCU-CHEK AVIVA PLUS) test strip Check your blood glucose daily 11/15/20   Elenore Paddy, NP  levothyroxine (SYNTHROID) 150 MCG tablet Take 1 tablet (150 mcg total) by mouth daily. 10/30/22   Gilmore Laroche, FNP  losartan (COZAAR) 50 MG tablet Take 1 tablet (50 mg total) by mouth daily. 02/22/23   Mallipeddi, Vishnu P, MD  Multiple Vitamin (MULTIVITAMIN WITH MINERALS) TABS tablet Take 1 tablet by mouth daily.     [provider]  Multiple Vitamins-Minerals (EMERGEN-C IMMUNE PO) Take 1 tablet by mouth daily.     [provider]  rivaroxaban (XARELTO) 20 MG TABS tablet Take 1 tablet (20 mg total) by mouth daily with supper. 04/26/23   Mallipeddi, Vishnu P, MD  Semaglutide (RYBELSUS) 3 MG TABS Take 1 tablet (3 mg total) by mouth daily. 06/09/23   Gilmore Laroche, FNP      Allergies    Patient has no known allergies.    Review of Systems   Review of Systems  All other systems reviewed and are negative.   Physical Exam Updated Vital Signs BP (!) 149/85   Pulse (!) 57   Temp 98.6 F (37 C) (Oral)   Resp  18   Ht 5\' 6"  (1.676 m)   Wt (!) 159.7 kg   SpO2 98%   BMI 56.83 kg/m  Physical Exam Vitals and nursing note reviewed.  Constitutional:      Appearance: Normal appearance.  Pulmonary:     Effort: Pulmonary effort is normal.  Musculoskeletal:     Comments: There is mild swelling and ecchymosis noted around the MCP joint of the right thumb.  She does have good range of motion.  There is mild swelling and ecchymosis noted over the olecranon.  There is no deformity and she has good range of motion with no crepitus.  Skin:    General: Skin is warm and dry.  Neurological:     Mental Status: She is alert and oriented to person, place, and time.     ED Results / Procedures / Treatments    Labs (all labs ordered are listed, but only abnormal results are displayed) Labs Reviewed - No data to display  EKG None  Radiology No results found.  Procedures Procedures    Medications Ordered in ED Medications - No data to display  ED Course/ Medical Decision Making/ A&P  X-rays of the hand and elbow are negative for fracture.  Patient to be discharged with sprain/contusion.  To rest, ice, and follow-up as needed if not improving.  Final Clinical Impression(s) / ED Diagnoses Final diagnoses:  None    Rx / DC Orders ED Discharge Orders     None         Geoffery Lyons, MD 07/26/23 [phone]

## 2023-07-26 NOTE — Progress Notes (Signed)
Patient ID: AMOYA FAZZONE                 DOB: 1956-01-03                    MRN: [id number]     HPI: Isabella Hall is a 67 y.o. female patient referred to pharmacy clinic by Dr Brett Canales to initiate GLP1-RA therapy. PMH is significant for A Fib, CHF, HTN, T2DM, CKD, hypothyroidism, and obesity. Most recent BMI 56.55.  Patient presents today to discuss weight and DM management. A1c currently 8.0%. Currently only managed on Rybelsus and is not checking her BG at home. 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  Weight has continued to slowly climb. BMI at visit today 56.68.  Friend at church has started on [facility] and has lost a considerable amount of weight. She has not been seeing any weight loss on rybelsus however she is on a low dose. Previously on glimiperide but had d/c.  Labs: Lab Results  Component Value Date   HGBA1C 8.0 (H) 06/07/2023    Wt Readings from Last 1 Encounters:  07/25/23 (!) 352 lb 1.6 oz (159.7 kg)    BP Readings from Last 1 Encounters:  07/26/23 (!) 128/101   Pulse Readings from Last 1 Encounters:  07/26/23 (!) 55       Component Value Date/Time   CHOL 247 (H) 06/07/2023 1529   TRIG 109 06/07/2023 1529   HDL 71 06/07/2023 1529   CHOLHDL 3.5 06/07/2023 1529   VLDL 22 06/07/2023 1529   LDLCALC 154 (H) 06/07/2023 1529   LDLCALC 149 (H) 04/06/2022 1607    Past Medical History:  Diagnosis Date   Atrial fibrillation (HCC)     CHADSVASC score 4   CKD (chronic kidney disease)    Essential hypertension    Hypothyroidism    Obesity    PONV (postoperative nausea and vomiting)    Type 2 diabetes, diet controlled (HCC)    Typical atrial flutter (HCC) 09/2020    Current Outpatient Medications on File Prior to Visit  Medication Sig Dispense Refill   atorvastatin (LIPITOR) 10 MG tablet Take 1 tablet (10 mg total) by mouth daily. 90 tablet 3   blood glucose meter kit and supplies KIT Dispense based on patient and  insurance preference. Use up to four times daily as directed. (FOR ICD-9 250.00, 250.01). 1 each 0   Capsicum, Cayenne, (CAYENNE PO) Take 1 Scoop by mouth daily.      carvedilol (COREG) 25 MG tablet TAKE 1 TABLET BY MOUTH TWICE DAILY WITH A MEAL 180 tablet 2   cholecalciferol (VITAMIN D3) 25 MCG (1000 UNIT) tablet Take 1,000 Units by mouth daily.     CINNAMON PO Take 1 capsule by mouth daily.     dapagliflozin propanediol (FARXIGA) 5 MG TABS tablet Take 1 tablet (5 mg total) by mouth daily. 30 tablet 2   diltiazem (CARDIZEM SR) 120 MG 12 hr capsule Take 1 capsule (120 mg total) by mouth 2 (two) times daily. 90 capsule 3   GARLIC PO Take 1 tablet by mouth daily.     Ginger, Zingiber officinalis, (GINGER EXTRACT PO) Take 1 tablet by mouth daily.      glimepiride (AMARYL) 4 MG tablet Take 1 tablet (4 mg total) by mouth daily before breakfast. 30 tablet 3   glucose blood (ACCU-CHEK AVIVA PLUS) test strip Check your blood glucose daily 100 each 12   levothyroxine (  SYNTHROID) 150 MCG tablet Take 1 tablet (150 mcg total) by mouth daily. 90 tablet 3   losartan (COZAAR) 50 MG tablet Take 1 tablet (50 mg total) by mouth daily. 90 tablet 1   Multiple Vitamin (MULTIVITAMIN WITH MINERALS) TABS tablet Take 1 tablet by mouth daily.      Multiple Vitamins-Minerals (EMERGEN-C IMMUNE PO) Take 1 tablet by mouth daily.      rivaroxaban (XARELTO) 20 MG TABS tablet Take 1 tablet (20 mg total) by mouth daily with supper. 30 tablet 6   Semaglutide (RYBELSUS) 3 MG TABS Take 1 tablet (3 mg total) by mouth daily. 30 tablet 2   No current facility-administered medications on file prior to visit.    No Known Allergies   Assessment/Plan:  1. Weight loss/T2DM - Patient current A1c 8.0% which is above goal of <7.0%. Current BMI places her in the severely obese category.  Discussed options for GLP1a therapy including Ozempic and Mounjaro.  Described pathophysiology of T2DM and weight gain and the role of diet, exercise,  and medications in managing glucose levels and weight. Discussed mechanism of action of Ozempic and Mounjaro.  Using demo pens, educated on storage, site selection and administration. Patient found Mounjaro pen easier to use.  Confirmed patient has no personal or family history of medullary thyroid carcinoma (MTC) or Multiple Endocrine Neoplasia syndrome type 2 (MEN 2). Injection technique reviewed at today's visit.  Advised patient on common side effects including nausea, diarrhea, dyspepsia, decreased appetite, and fatigue. Counseled patient on reducing meal size and how to titrate medication to minimize side effects. Counseled patient to call if intolerable side effects or if experiencing dehydration, abdominal pain, or dizziness. Patient will adhere to dietary modifications and will target at least 150 minutes of moderate intensity exercise weekly.   Will complete PA for [facility] and contact patient when approved. Patient voiced understanding.  Stop Rybelsus Start Mounjaro 2.5mg  once weekly Follow up via myChart in 4 weeks  Laural Golden, PharmD, BCACP, CDCES, CPP [address], [address] [place], [place], [address] Phone: [phone], Fax: [phone]

## 2023-07-26 NOTE — Patient Instructions (Addendum)
  It was nice meeting you this afternoon  We would like your A1c to be less than 7.0%  The medications we discussed today are Ozempic and Mounjaro  I will complete the prior authorization for [facility] for you and contact you when it is approved  The initial dose of Mounjaro is 2.5mg  once a week for 4 weeks.  Each month it increases. Please send me a myChart message when you are close to needing a refill  Please call or message with any questions  Laural Golden, PharmD, BCACP, CDCES, CPP [address], [address] [place], [place], [address] Phone: [phone], Fax: [phone]

## 2023-07-27 ENCOUNTER — Telehealth: Payer: Self-pay | Admitting: Pharmacist

## 2023-07-27 ENCOUNTER — Encounter: Payer: Self-pay | Admitting: Pharmacist

## 2023-07-27 NOTE — Telephone Encounter (Signed)
PA for [facility] submitted. Key: [id number]

## 2023-08-10 ENCOUNTER — Ambulatory Visit (INDEPENDENT_AMBULATORY_CARE_PROVIDER_SITE_OTHER): Payer: HMO | Admitting: Family Medicine

## 2023-08-10 ENCOUNTER — Encounter: Payer: Self-pay | Admitting: Family Medicine

## 2023-08-10 VITALS — BP 150/70 | HR 66 | Ht 66.0 in | Wt 348.0 lb

## 2023-08-10 DIAGNOSIS — E1122 Type 2 diabetes mellitus with diabetic chronic kidney disease: Secondary | ICD-10-CM

## 2023-08-10 DIAGNOSIS — E1169 Type 2 diabetes mellitus with other specified complication: Secondary | ICD-10-CM

## 2023-08-10 DIAGNOSIS — E785 Hyperlipidemia, unspecified: Secondary | ICD-10-CM

## 2023-08-10 MED ORDER — TIRZEPATIDE 2.5 MG/0.5ML ~~LOC~~ SOAJ
2.5000 mg | SUBCUTANEOUS | 0 refills | Status: DC
Start: 2023-08-10 — End: 2023-09-03

## 2023-08-10 NOTE — Progress Notes (Unsigned)
Established Patient Office Visit  Subjective:  Patient ID: Isabella Hall, female    DOB: [birth year]  Age: 67 y.o. MRN: [id number]  CC:  Chief Complaint  Patient presents with   Follow-up    Follow up    HPI Isabella Hall is a 67 y.o. female with past medical history of hyperlipidemia, hypertension, hypothyroidism, and type 2 diabetes presents for f/u of  chronic medical conditions. For the details of today's visit, please refer to the assessment and plan.     Wt Readings from Last 3 Encounters:  08/10/23 (!) 348 lb 0.6 oz (157.9 kg)  07/26/23 (!) 351 lb (159.2 kg)  07/25/23 (!) 352 lb 1.6 oz (159.7 kg)     Past Medical History:  Diagnosis Date   Atrial fibrillation (HCC)     CHADSVASC score 4   CKD (chronic kidney disease)    Essential hypertension    Hypothyroidism    Obesity    PONV (postoperative nausea and vomiting)    Type 2 diabetes, diet controlled (HCC)    Typical atrial flutter (HCC) 09/2020    Past Surgical History:  Procedure Laterality Date   APPENDECTOMY  1979   ATRIAL FIBRILLATION ABLATION  03/26/2018   ATRIAL FIBRILLATION ABLATION N/A 03/26/2018   Procedure: ATRIAL FIBRILLATION ABLATION;  Surgeon: Hillis Range, MD;  Location: [facility];  Service: Cardiovascular;  Laterality: N/A;   CARDIOVERSION N/A 01/25/2015   Procedure: CARDIOVERSION;  Surgeon: Jonelle Sidle, MD;  Location: [facility] OR[facility];  Service: Cardiovascular;  Laterality: N/A;   CARDIOVERSION N/A 10/28/2020   Procedure: CARDIOVERSION;  Surgeon: Jonelle Sidle, MD;  Location: [facility];  Service: Cardiovascular;  Laterality: N/A;   CARDIOVERSION N/A 06/10/2021   Procedure: CARDIOVERSION;  Surgeon: Chilton Si, MD;  Location: [facility] ENDOSCOPY;  Service: Cardiovascular;  Laterality: N/A;   CARDIOVERSION N/A 09/15/2022   Procedure: CARDIOVERSION;  Surgeon: Christell Constant, MD;  Location: [facility] ORS;  Service: Cardiovascular;  Laterality: N/A;   CHOLECYSTECTOMY OPEN  1979    COLONOSCOPY WITH PROPOFOL N/A 06/16/2022   Procedure: COLONOSCOPY WITH PROPOFOL;  Surgeon: Lanelle Bal, DO;  Location: [facility];  Service: Endoscopy;  Laterality: N/A;  3:00pm   LEFT HEART CATHETERIZATION WITH CORONARY ANGIOGRAM N/A 01/27/2015   Procedure: LEFT HEART CATHETERIZATION WITH CORONARY ANGIOGRAM;  Surgeon: Lesleigh Noe, MD;  Location: [facility] CATH LAB;  Service: Cardiovascular;  Laterality: N/A;   TEE WITHOUT CARDIOVERSION N/A 01/25/2015   Procedure: TRANSESOPHAGEAL ECHOCARDIOGRAM (TEE);  Surgeon: Jonelle Sidle, MD;  Location: [facility];  Service: Cardiovascular;  Laterality: N/A;   TUBAL LIGATION  1980    Family History  Problem Relation Age of Onset   Cancer Mother    Colon cancer Mother        in 56s   Diabetes type II Mother    Heart disease Father    Coronary artery disease Sister        died of an MI in her 29's   Heart disease Sister        dies at age 68   Diabetes type II Sister    Kidney disease Sister    Cancer Maternal Grandmother        ?leukemia   Hypertension Other    Leukemia Nephew        30    Social History   Socioeconomic History   Marital status: Divorced    Spouse name: Not on file   Number of children:  2   Years of education: Not on file   Highest education level: Some college, no degree  Occupational History   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 .   Social Determinants of Health   Financial Resource Strain: Low Risk  (08/08/2023)   Overall Financial Resource Strain (CARDIA)     Difficulty of Paying Living Expenses: Not hard at all  Food Insecurity: No Food Insecurity (08/08/2023)   Hunger Vital Sign    Worried About Running Out of Food in the Last Year: Never true    Ran Out of Food in the Last Year: Never true  Transportation Needs: No Transportation Needs (08/08/2023)   PRAPARE - Administrator, Civil Service (Medical): No    Lack of Transportation (Non-Medical): No  Physical Activity: Insufficiently Active (08/08/2023)   Exercise Vital Sign    Days of Exercise per Week: 5 days    Minutes of Exercise per Session: 10 min  Stress: No Stress Concern Present (08/08/2023)   [employer] of Occupational Health - Occupational Stress Questionnaire    Feeling of Stress : Not at all  Social Connections: Moderately Integrated (08/08/2023)   Social Connection and Isolation Panel [NHANES]    Frequency of Communication with Friends and Family: More than three times a week    Frequency of Social Gatherings with Friends and Family: More than three times a week    Attends Religious Services: More than 4 times per year    Active Member of [employer] or Organizations: Yes    Attends Banker Meetings: More than 4 times per year    Marital Status: Divorced  Intimate Partner Violence: Not At Risk (05/10/2023)   Humiliation, Afraid, Rape, and Kick questionnaire    Fear of Current or Ex-Partner: No    Emotionally Abused: No    Physically Abused: No    Sexually Abused: No    Outpatient Medications Prior to Visit  Medication Sig Dispense Refill   carvedilol (COREG) 25 MG tablet TAKE 1 TABLET BY MOUTH TWICE DAILY WITH A MEAL 180 tablet 2   cholecalciferol (VITAMIN D3) 25 MCG (1000 UNIT) tablet Take 1,000 Units by mouth daily.     CINNAMON PO Take 1 capsule by mouth daily.     diltiazem (CARDIZEM SR) 120 MG 12 hr capsule Take 1 capsule (120 mg total) by mouth 2 (two) times daily. 90 capsule 3   GARLIC PO Take 1 tablet by mouth daily.     Ginger, Zingiber  officinalis, (GINGER EXTRACT PO) Take 1 tablet by mouth daily.      levothyroxine (SYNTHROID) 150 MCG tablet Take 1 tablet (150 mcg total) by mouth daily. 90 tablet 3   losartan (COZAAR) 50 MG tablet Take 1 tablet (50 mg total) by mouth daily. 90 tablet 1   Multiple Vitamin (MULTIVITAMIN WITH MINERALS) TABS tablet Take 1 tablet by mouth daily.      Multiple Vitamins-Minerals (EMERGEN-C IMMUNE PO) Take 1 tablet by mouth daily.  rivaroxaban (XARELTO) 20 MG TABS tablet Take 1 tablet (20 mg total) by mouth daily with supper. 30 tablet 6   Capsicum, Cayenne, (CAYENNE PO) Take 1 Scoop by mouth daily.      No facility-administered medications prior to visit.    No Known Allergies  ROS Review of Systems  Constitutional:  Negative for chills and fever.  Eyes:  Negative for visual disturbance.  Respiratory:  Negative for chest tightness and shortness of breath.   Neurological:  Negative for dizziness and headaches.      Objective:    Physical Exam HENT:     Head: Normocephalic.     Mouth/Throat:     Mouth: Mucous membranes are moist.  Cardiovascular:     Rate and Rhythm: Normal rate.     Heart sounds: Normal heart sounds.  Pulmonary:     Effort: Pulmonary effort is normal.     Breath sounds: Normal breath sounds.  Neurological:     Mental Status: She is alert.     BP (!) 150/70 (BP Location: Left Arm, Patient Position: Sitting, Cuff Size: Large)   Pulse 66   Ht 5\' 6"  (1.676 m)   Wt (!) 348 lb 0.6 oz (157.9 kg)   SpO2 92%   BMI 56.18 kg/m  Wt Readings from Last 3 Encounters:  08/10/23 (!) 348 lb 0.6 oz (157.9 kg)  07/26/23 (!) 351 lb (159.2 kg)  07/25/23 (!) 352 lb 1.6 oz (159.7 kg)    Lab Results  Component Value Date   TSH 13.795 (H) 07/02/2023   Lab Results  Component Value Date   WBC 5.5 06/07/2023   HGB 12.0 06/07/2023   HCT 37.0 06/07/2023   MCV 86.2 06/07/2023   PLT 149 (L) 06/07/2023   Lab Results  Component Value Date   NA 135 06/07/2023   K 4.1  06/07/2023   CO2 24 06/07/2023   GLUCOSE 185 (H) 06/07/2023   BUN 23 06/07/2023   CREATININE 1.85 (H) 06/07/2023   BILITOT 0.9 06/07/2023   ALKPHOS 55 06/07/2023   AST 26 06/07/2023   ALT 26 06/07/2023   PROT 7.4 06/07/2023   ALBUMIN 3.5 06/07/2023   CALCIUM 9.1 06/07/2023   ANIONGAP 11 06/07/2023   GFR 37.19 (L) 03/03/2015   Lab Results  Component Value Date   CHOL 247 (H) 06/07/2023   Lab Results  Component Value Date   HDL 71 06/07/2023   Lab Results  Component Value Date   LDLCALC 154 (H) 06/07/2023   Lab Results  Component Value Date   TRIG 109 06/07/2023   Lab Results  Component Value Date   CHOLHDL 3.5 06/07/2023   Lab Results  Component Value Date   HGBA1C 8.0 (H) 06/07/2023      Assessment & Plan:  Type 2 diabetes mellitus with chronic kidney disease, without long-term current use of insulin, unspecified CKD stage (HCC) Assessment & Plan: The patient reports that her previous medication was discontinued by her cardiologist, and she was prescribed Mounjaro 2.5 mg weekly. However, she has not yet started therapy and has not received a prescription. We will send in the prescription today and encourage her to begin therapy. The patient was advised to request a refill monthly to facilitate dosage increases as needed.  In addition to [facility], I recommend a heart-healthy diet rich in fruits, vegetables, whole grains, and lean meats, along with moderate-intensity exercise on most days of the week.  Orders: -     Tirzepatide; Inject 2.5 mg into  the skin once a week.  Dispense: 2 mL; Refill: 0  Hyperlipidemia associated with type 2 diabetes mellitus (HCC) Assessment & Plan: The patient was taking atorvastatin 10 mg daily but reports discontinuing the medication due to unpleasant side effects. She mentioned that she has since implemented lifestyle changes.  I advised the patient to reduce her intake of saturated fats, trans fats, and cholesterol, and to increase  her intake of fruits, vegetables, whole grains, and omega-3 fatty acids, along with increasing her physical activity. We will reevaluate her labs in 3 months.    Note: This chart has been completed using Engineer, civil (consulting) software, and while attempts have been made to ensure accuracy, certain words and phrases may not be transcribed as intended.   Follow-up: Return in about 3 months (around 11/10/2023).   Gilmore Laroche, FNP

## 2023-08-10 NOTE — Patient Instructions (Addendum)
I appreciate the opportunity to provide care to you today!    Follow up:  3 months  Labs:next visit  Type 2 Diabetes Mellitus (T2DM):  Begin taking Mounjaro 2.5 mg weekly. Please request a monthly refill to facilitate dose escalation. I recommend continuing to reduce your intake of high-sugar foods and beverages and aiming for moderate-intensity physical activity at least 5 days a week.  Hyperlipidemia:  Continue with lifestyle modifications. I advise reducing your intake of trans fats, saturated fats, and cholesterol, and increasing physical activity.  Hypertension:  Your blood pressure is elevated today. Please continue with your current treatment regimen. I recommend decreasing your intake of high-sodium foods and increasing physical activity.     Please continue to a heart-healthy diet and increase your physical activities. Try to exercise for at least five days a week.    It was a pleasure to see you and I look forward to continuing to work together on your health and well-being. Please do not hesitate to call the office if you need care or have questions about your care.  In case of emergency, please visit the Emergency Department for urgent care, or contact our clinic at [phone] to schedule an appointment. We're here to help you!   Have a wonderful day and week. With Gratitude, Gilmore Laroche MSN, FNP-BC

## 2023-08-11 NOTE — Assessment & Plan Note (Signed)
The patient was taking atorvastatin 10 mg daily but reports discontinuing the medication due to unpleasant side effects. She mentioned that she has since implemented lifestyle changes.  I advised the patient to reduce her intake of saturated fats, trans fats, and cholesterol, and to increase her intake of fruits, vegetables, whole grains, and omega-3 fatty acids, along with increasing her physical activity. We will reevaluate her labs in 3 months.

## 2023-08-11 NOTE — Assessment & Plan Note (Signed)
The patient reports that her previous medication was discontinued by her cardiologist, and she was prescribed Mounjaro 2.5 mg weekly. However, she has not yet started therapy and has not received a prescription. We will send in the prescription today and encourage her to begin therapy. The patient was advised to request a refill monthly to facilitate dosage increases as needed.  In addition to [facility], I recommend a heart-healthy diet rich in fruits, vegetables, whole grains, and lean meats, along with moderate-intensity exercise on most days of the week.

## 2023-08-13 ENCOUNTER — Other Ambulatory Visit: Payer: Self-pay | Admitting: Internal Medicine

## 2023-08-15 DIAGNOSIS — M62830 Muscle spasm of back: Secondary | ICD-10-CM | POA: Diagnosis not present

## 2023-08-15 DIAGNOSIS — M9903 Segmental and somatic dysfunction of lumbar region: Secondary | ICD-10-CM | POA: Diagnosis not present

## 2023-08-15 DIAGNOSIS — M9902 Segmental and somatic dysfunction of thoracic region: Secondary | ICD-10-CM | POA: Diagnosis not present

## 2023-08-15 DIAGNOSIS — M9905 Segmental and somatic dysfunction of pelvic region: Secondary | ICD-10-CM | POA: Diagnosis not present

## 2023-08-15 DIAGNOSIS — M546 Pain in thoracic spine: Secondary | ICD-10-CM | POA: Diagnosis not present

## 2023-08-21 ENCOUNTER — Telehealth: Payer: Self-pay | Admitting: Family Medicine

## 2023-08-21 NOTE — Telephone Encounter (Signed)
Patient called said the medicine rivaroxaban (XARELTO) 20 MG TABS tablet  has gone up in price $ 141.00 is there something cheaper to send in ?    Pharmacy: [facility]  Patient asking for a call back [phone]

## 2023-08-22 NOTE — Telephone Encounter (Signed)
Please advise the patient to follow up with Dr. Luane School to discuss alternatives to Xarelto.

## 2023-08-27 NOTE — Telephone Encounter (Signed)
Patient aware.

## 2023-08-31 ENCOUNTER — Other Ambulatory Visit: Payer: Self-pay | Admitting: Family Medicine

## 2023-08-31 DIAGNOSIS — E1122 Type 2 diabetes mellitus with diabetic chronic kidney disease: Secondary | ICD-10-CM

## 2023-09-03 ENCOUNTER — Other Ambulatory Visit (HOSPITAL_COMMUNITY): Payer: Self-pay | Admitting: *Deleted

## 2023-09-03 MED ORDER — CARVEDILOL 25 MG PO TABS: 25.0000 mg | ORAL_TABLET | Freq: Two times a day (BID) | ORAL | 1 refills | Status: DC

## 2023-09-04 ENCOUNTER — Other Ambulatory Visit (HOSPITAL_COMMUNITY): Payer: Self-pay

## 2023-09-04 ENCOUNTER — Telehealth: Payer: Self-pay | Admitting: Pharmacist

## 2023-09-04 DIAGNOSIS — E1122 Type 2 diabetes mellitus with diabetic chronic kidney disease: Secondary | ICD-10-CM

## 2023-09-04 MED ORDER — MOUNJARO 5 MG/0.5ML ~~LOC~~ SOAJ: 5.0000 mg | SUBCUTANEOUS | 0 refills | Status: DC

## 2023-09-04 NOTE — Telephone Encounter (Signed)
Patient left message regarding her Mounjaro. Needed next dose called in. However PCP called in yesterday, same dose of 2.5, Patient also also says price went up 100 dollars. Does not believe she is in donut hole.   Will send in next dose so it is ready for patient when she needs it.

## 2023-09-12 ENCOUNTER — Encounter: Payer: Self-pay | Admitting: Pharmacist

## 2023-09-12 NOTE — Progress Notes (Signed)
Pharmacy Quality Measure Review  This patient is appearing on a report for being at risk of failing the adherence measure for diabetes medications this calendar year.   Medication: Mounjaro 5 mg  Last fill date: 09/04/23 for 28 day supply  Insurance report was not up to date. No action needed at this time.   Catie Eppie Gibson, PharmD, BCACP, CPP Clinical Pharmacist [facility] [phone]

## 2023-09-29 ENCOUNTER — Other Ambulatory Visit: Payer: Self-pay | Admitting: Internal Medicine

## 2023-10-02 ENCOUNTER — Other Ambulatory Visit: Payer: Self-pay | Admitting: Internal Medicine

## 2023-10-03 MED ORDER — MOUNJARO 7.5 MG/0.5ML ~~LOC~~ SOAJ: 7.5000 mg | SUBCUTANEOUS | 0 refills | Status: DC

## 2023-10-03 NOTE — Telephone Encounter (Signed)
Spoke with pt who reports tolerating Mounjaro 5mg  well, no side effects, wishes to increase her dose for next month. Rx sent in for 7.5mg  dose.

## 2023-11-02 ENCOUNTER — Telehealth: Payer: HMO | Admitting: Family Medicine

## 2023-11-02 ENCOUNTER — Other Ambulatory Visit: Payer: Self-pay | Admitting: Internal Medicine

## 2023-11-02 ENCOUNTER — Ambulatory Visit: Payer: Self-pay | Admitting: Family Medicine

## 2023-11-02 DIAGNOSIS — J019 Acute sinusitis, unspecified: Secondary | ICD-10-CM | POA: Diagnosis not present

## 2023-11-02 DIAGNOSIS — E1122 Type 2 diabetes mellitus with diabetic chronic kidney disease: Secondary | ICD-10-CM

## 2023-11-02 DIAGNOSIS — B9689 Other specified bacterial agents as the cause of diseases classified elsewhere: Secondary | ICD-10-CM | POA: Diagnosis not present

## 2023-11-02 MED ORDER — MOUNJARO 10 MG/0.5ML ~~LOC~~ SOAJ
10.0000 mg | SUBCUTANEOUS | 0 refills | Status: DC
Start: 2023-11-02 — End: 2023-11-30

## 2023-11-02 MED ORDER — AMOXICILLIN-POT CLAVULANATE 875-125 MG PO TABS: 1.0000 | ORAL_TABLET | Freq: Two times a day (BID) | ORAL | 0 refills | Status: DC

## 2023-11-02 NOTE — Progress Notes (Signed)
Virtual Visit Consent   Isabella Hall, you are scheduled for a virtual visit with a [facility] provider today. Just as with appointments in the office, your consent must be obtained to participate. Your consent will be active for this visit and any virtual visit you may have with one of our providers in the next 365 days. If you have a MyChart account, a copy of this consent can be sent to you electronically.  As this is a virtual visit, video technology does not allow for your provider to perform a traditional examination. This may limit your provider's ability to fully assess your condition. If your provider identifies any concerns that need to be evaluated in person or the need to arrange testing (such as labs, EKG, etc.), we will make arrangements to do so. Although advances in technology are sophisticated, we cannot ensure that it will always work on either your end or our end. If the connection with a video visit is poor, the visit may have to be switched to a telephone visit. With either a video or telephone visit, we are not always able to ensure that we have a secure connection.  By engaging in this virtual visit, you consent to the provision of healthcare and authorize for your insurance to be billed (if applicable) for the services provided during this visit. Depending on your insurance coverage, you may receive a charge related to this service.  I need to obtain your verbal consent now. Are you willing to proceed with your visit today? [name] has provided verbal consent on 11/02/2023 for a virtual visit (video or telephone). Georgana Curio, FNP  Date: 11/02/2023 11:38 AM  Virtual Visit via Video Note   I, Georgana Curio, connected with  Isabella Hall  ([id number], 10/24/1956) on 11/02/23 at 11:30 AM EST by a video-enabled telemedicine application and verified that I am speaking with the correct person using two identifiers.  Location: Patient: Virtual Visit Location Patient:  Home Provider: Virtual Visit Location Provider: Home Office   I discussed the limitations of evaluation and management by telemedicine and the availability of in person appointments. The patient expressed understanding and agreed to proceed.    History of Present Illness: Isabella Hall is a 67 y.o. who identifies as a female who was assigned female at birth, and is being seen today for sinus pressure and pain with headache for over a week worsening. Post nasal drainage, pain over maxillary sinuses worse on left with left ear pain. No fever, cough or wheezing.Marland Kitchen  HPI: HPI  Problems:  Patient Active Problem List   Diagnosis Date Noted   Therapeutic drug monitoring 10/25/2022   Need for immunization against influenza 10/25/2022   Hyperlipidemia associated with type 2 diabetes mellitus (HCC) 04/22/2022   Encounter for screening colonoscopy 03/31/2022   Chronic anticoagulation 03/31/2022   Paroxysmal atrial fibrillation (HCC) 11/30/2020   Persistent atrial fibrillation (HCC) 03/26/2018   Heart failure with improved ejection fraction (HFimpEF) (HCC)    Atrial fibrillation with RVR (HCC) 08/06/2017   Acute bronchitis 08/06/2017   Atrial fibrillation with rapid ventricular response (HCC)    Atrial fibrillation with tachycardic ventricular rate (HCC) 07/04/2016   Nonischemic cardiomyopathy (HCC)    Atrial flutter (HCC) 01/25/2015   Acute diastolic heart failure (HCC)    Demand ischemia (HCC)    Mitral regurgitation    Dyspnea 01/20/2015   Type 2 diabetes mellitus with diabetic chronic kidney disease (HCC) 01/20/2015   Acute CHF- secondary to AF  with RVR 01/20/2015   Benign essential HTN 01/20/2015   Hypothyroid 01/20/2015   Obesity-BMI 45 01/20/2015   Acute respiratory failure with hypoxia (HCC) 01/20/2015   Elevated troponin 01/20/2015   Chronic kidney disease due to type 2 diabetes mellitus (HCC) 01/20/2015    Allergies: No Known Allergies Medications:  Current Outpatient  Medications:    carvedilol (COREG) 25 MG tablet, Take 1 tablet (25 mg total) by mouth 2 (two) times daily with a meal., Disp: 180 tablet, Rfl: 1   cholecalciferol (VITAMIN D3) 25 MCG (1000 UNIT) tablet, Take 1,000 Units by mouth daily., Disp: , Rfl:    CINNAMON PO, Take 1 capsule by mouth daily., Disp: , Rfl:    diltiazem (CARDIZEM SR) 120 MG 12 hr capsule, Take 1 capsule by mouth twice daily, Disp: 180 capsule, Rfl: 2   GARLIC PO, Take 1 tablet by mouth daily., Disp: , Rfl:    Ginger, Zingiber officinalis, (GINGER EXTRACT PO), Take 1 tablet by mouth daily. , Disp: , Rfl:    levothyroxine (SYNTHROID) 150 MCG tablet, Take 1 tablet (150 mcg total) by mouth daily., Disp: 90 tablet, Rfl: 3   losartan (COZAAR) 50 MG tablet, Take 1 tablet by mouth once daily, Disp: 90 tablet, Rfl: 2   Multiple Vitamin (MULTIVITAMIN WITH MINERALS) TABS tablet, Take 1 tablet by mouth daily. , Disp: , Rfl:    Multiple Vitamins-Minerals (EMERGEN-C IMMUNE PO), Take 1 tablet by mouth daily. , Disp: , Rfl:    rivaroxaban (XARELTO) 20 MG TABS tablet, Take 1 tablet (20 mg total) by mouth daily with supper., Disp: 30 tablet, Rfl: 6   tirzepatide (MOUNJARO) 7.5 MG/0.5ML Pen, Inject 7.5 mg into the skin once a week., Disp: 2 mL, Rfl: 0  Observations/Objective: Patient is well-developed, well-nourished in no acute distress.  Resting comfortably  at home.  Head is normocephalic, atraumatic.  No labored breathing.  Speech is clear and coherent with logical content.  Patient is alert and oriented at baseline.    Assessment and Plan: 1. Acute bacterial sinusitis  Increase fluids, continue saline or flonase, start claritin, UC if sx persist or worsen.   Follow Up Instructions: I discussed the assessment and treatment plan with the patient. The patient was provided an opportunity to ask questions and all were answered. The patient agreed with the plan and demonstrated an understanding of the instructions.  A copy of instructions  were sent to the patient via MyChart unless otherwise noted below.     The patient was advised to call back or seek an in-person evaluation if the symptoms worsen or if the condition fails to improve as anticipated.    Georgana Curio, FNP

## 2023-11-02 NOTE — Patient Instructions (Signed)

## 2023-11-02 NOTE — Telephone Encounter (Signed)
Copied from CRM [id number]. Topic: Clinical - Medication Question >> Nov 02, 2023  9:12 AM Herbert Seta B wrote: Reason for CRM: Patient calling because she wants to know what OTC medication can be taken for a sinus headache x1 week, please contact   Chief Complaint: Headache Symptoms: Left eye pain, left ear pain, and sinus pressure Frequency: Headach comes and goes, but is getting worse over the week Pertinent Negatives: Patient denies HX of migraine Disposition: [] ED /[] Urgent Care (no appt availability in office) / [x] Appointment(In office/virtual)/ []  [facility]/ [] Home Care/ [] Refused Recommended Disposition /[] [facility]/ []  Follow-up with PCP Additional Notes: Reports trying several home remedies, unsure of OTC medications she can take due to history of afib.     Reason for Disposition  [1] SEVERE headache (e.g., excruciating) AND [2] not improved after 2 hours of pain medicine  Answer Assessment - Initial Assessment Questions 1. LOCATION: "Where does it hurt?"      Behind eyes, left eyes worse, and left ear  2. ONSET: "When did the headache start?" (Minutes, hours or days)      This is has been going on for 1 week.   3. PATTERN: "Does the pain come and go, or has it been constant since it started?"     Pain comes and goes after home remedies for the past week, getting worse  4. SEVERITY: "How bad is the pain?" and "What does it keep you from doing?"  (e.g., Scale 1-10; mild, moderate, or severe)   - MILD (1-3): doesn't interfere with normal activities    - MODERATE (4-7): interferes with normal activities or awakens from sleep    - SEVERE (8-10): excruciating pain, unable to do any normal activities        8  5. RECURRENT SYMPTOM: "Have you ever had headaches before?" If Yes, ask: "When was the last time?" and "What happened that time?"      Yes, when she has a sinus ifection  6. CAUSE: "What do you think is causing the headache?"     States that it  could be a sinus infection  7. MIGRAINE: "Have you been diagnosed with migraine headaches?" If Yes, ask: "Is this headache similar?"      No  8. HEAD INJURY: "Has there been any recent injury to the head?"      No  9. OTHER SYMPTOMS: "Do you have any other symptoms?" (fever, stiff neck, eye pain, sore throat, cold symptoms)     Eye pain, left ear pain   10. PREGNANCY: "Is there any chance you are pregnant?" "When was your last menstrual period?"       No  Protocols used: Headache-A-AH

## 2023-11-05 ENCOUNTER — Ambulatory Visit: Payer: Self-pay | Admitting: Family Medicine

## 2023-11-16 ENCOUNTER — Other Ambulatory Visit: Payer: Self-pay | Admitting: Internal Medicine

## 2023-11-19 ENCOUNTER — Ambulatory Visit (INDEPENDENT_AMBULATORY_CARE_PROVIDER_SITE_OTHER): Payer: HMO | Admitting: Family Medicine

## 2023-11-19 ENCOUNTER — Encounter: Payer: Self-pay | Admitting: Family Medicine

## 2023-11-19 VITALS — BP 138/82 | HR 72 | Ht 66.0 in | Wt 319.1 lb

## 2023-11-19 DIAGNOSIS — E032 Hypothyroidism due to medicaments and other exogenous substances: Secondary | ICD-10-CM

## 2023-11-19 DIAGNOSIS — E559 Vitamin D deficiency, unspecified: Secondary | ICD-10-CM | POA: Diagnosis not present

## 2023-11-19 DIAGNOSIS — E785 Hyperlipidemia, unspecified: Secondary | ICD-10-CM

## 2023-11-19 DIAGNOSIS — Z7985 Long-term (current) use of injectable non-insulin antidiabetic drugs: Secondary | ICD-10-CM | POA: Diagnosis not present

## 2023-11-19 DIAGNOSIS — J302 Other seasonal allergic rhinitis: Secondary | ICD-10-CM | POA: Diagnosis not present

## 2023-11-19 DIAGNOSIS — E1169 Type 2 diabetes mellitus with other specified complication: Secondary | ICD-10-CM

## 2023-11-19 DIAGNOSIS — E1122 Type 2 diabetes mellitus with diabetic chronic kidney disease: Secondary | ICD-10-CM | POA: Diagnosis not present

## 2023-11-19 MED ORDER — LEVOCETIRIZINE DIHYDROCHLORIDE 5 MG PO TABS
5.0000 mg | ORAL_TABLET | Freq: Every evening | ORAL | 2 refills | Status: DC
Start: 2023-11-19 — End: 2024-02-21

## 2023-11-19 NOTE — Progress Notes (Unsigned)
Established Patient Office Visit  Subjective:  Patient ID: Isabella Hall, female    DOB: [birth year]  Age: 67 y.o. MRN: [id number]  CC:  Chief Complaint  Patient presents with   Care Management    3 month f/u    HPI Isabella Hall is a 67 y.o. female with past medical history of type 2 diabetes, hyperlipidemia, hypothyroidism, and hypertension presents for f/u of  chronic medical conditions. For the details of today's visit, please refer to the assessment and plan.    Past Medical History:  Diagnosis Date   Atrial fibrillation (HCC)     CHADSVASC score 4   CKD (chronic kidney disease)    Essential hypertension    Hypothyroidism    Obesity    PONV (postoperative nausea and vomiting)    Type 2 diabetes, diet controlled (HCC)    Typical atrial flutter (HCC) 09/2020    Past Surgical History:  Procedure Laterality Date   APPENDECTOMY  1979   ATRIAL FIBRILLATION ABLATION  03/26/2018   ATRIAL FIBRILLATION ABLATION N/A 03/26/2018   Procedure: ATRIAL FIBRILLATION ABLATION;  Surgeon: Hillis Range, MD;  [facility]: [facility];  Service: Cardiovascular;  Laterality: N/A;   CARDIOVERSION N/A 01/25/2015   Procedure: CARDIOVERSION;  Surgeon: Jonelle Sidle, MD;  Location: [facility];  Service: Cardiovascular;  Laterality: N/A;   CARDIOVERSION N/A 10/28/2020   Procedure: CARDIOVERSION;  Surgeon: Jonelle Sidle, MD;  Location: [facility];  Service: Cardiovascular;  Laterality: N/A;   CARDIOVERSION N/A 06/10/2021   Procedure: CARDIOVERSION;  Surgeon: Chilton Si, MD;  Location: [facility] ENDOSCOPY;  Service: Cardiovascular;  Laterality: N/A;   CARDIOVERSION N/A 09/15/2022   Procedure: CARDIOVERSION;  Surgeon: Christell Constant, MD;  Location: [facility] ORS;  Service: Cardiovascular;  Laterality: N/A;   CHOLECYSTECTOMY OPEN  1979   COLONOSCOPY WITH PROPOFOL N/A 06/16/2022   Procedure: COLONOSCOPY WITH PROPOFOL;  Surgeon: Lanelle Bal, DO;  Location: [facility];  Service:  Endoscopy;  Laterality: N/A;  3:00pm   LEFT HEART CATHETERIZATION WITH CORONARY ANGIOGRAM N/A 01/27/2015   Procedure: LEFT HEART CATHETERIZATION WITH CORONARY ANGIOGRAM;  Surgeon: Lesleigh Noe, MD;  Location: [facility] CATH LAB;  Service: Cardiovascular;  Laterality: N/A;   TEE WITHOUT CARDIOVERSION N/A 01/25/2015   Procedure: TRANSESOPHAGEAL ECHOCARDIOGRAM (TEE);  Surgeon: Jonelle Sidle, MD;  Location: [facility];  Service: Cardiovascular;  Laterality: N/A;   TUBAL LIGATION  1980    Family History  Problem Relation Age of Onset   Cancer Mother    Colon cancer Mother        in 32s   Diabetes type II Mother    Heart disease Father    Coronary artery disease Sister        died of an MI in her 60's   Heart disease Sister        dies at age 74   Diabetes type II Sister    Kidney disease Sister    Cancer Maternal Grandmother        ?leukemia   Hypertension Other    Leukemia Nephew        30    Social History   Socioeconomic History   Marital status: Divorced    Spouse name: Not on file   Number of children: 2   Years of education: Not on file   Highest education level: Associate degree: academic program  Occupational History   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 .   Social Determinants of Health   Financial Resource Strain: Low Risk  (11/18/2023)   Overall Financial Resource Strain (CARDIA)    Difficulty of Paying Living Expenses: Not hard at all  Food Insecurity: No Food Insecurity (11/18/2023)   Hunger Vital Sign    Worried  About Running Out of Food in the Last Year: Never true    Ran Out of Food in the Last Year: Never true  Transportation Needs: No Transportation Needs (11/18/2023)   PRAPARE - Administrator, Civil Service (Medical): No    Lack of Transportation (Non-Medical): No  Physical Activity: Insufficiently Active (11/18/2023)   Exercise Vital Sign    Days of Exercise per Week: 5 days    Minutes of Exercise per Session: 20 min  Stress: No Stress Concern Present (11/18/2023)   [employer] of Occupational Health - Occupational Stress Questionnaire    Feeling of Stress : Not at all  Social Connections: Moderately Integrated (11/18/2023)   Social Connection and Isolation Panel [NHANES]    Frequency of Communication with Friends and Family: More than three times a week    Frequency of Social Gatherings with Friends and Family: More than three times a week    Attends Religious Services: More than 4 times per year    Active Member of [employer] or Organizations: Yes    Attends Engineer, structural: More than 4 times per year    Marital Status: Divorced  Intimate Partner Violence: Not At Risk (05/10/2023)   Humiliation, Afraid, Rape, and Kick questionnaire    Fear of Current or Ex-Partner: No    Emotionally Abused: No    Physically Abused: No    Sexually Abused: No    Outpatient Medications Prior to Visit  Medication Sig Dispense Refill   amoxicillin-clavulanate (AUGMENTIN) 875-125 MG tablet Take 1 tablet by mouth 2 (two) times daily. 20 tablet 0   carvedilol (COREG) 25 MG tablet Take 1 tablet (25 mg total) by mouth 2 (two) times daily with a meal. 180 tablet 1   cholecalciferol (VITAMIN D3) 25 MCG (1000 UNIT) tablet Take 1,000 Units by mouth daily.     diltiazem (CARDIZEM SR) 120 MG 12 hr capsule Take 1 capsule by mouth twice daily 180 capsule 2   GARLIC PO Take 1 tablet by mouth daily.     levothyroxine (SYNTHROID) 150 MCG tablet Take 1 tablet (150 mcg total) by mouth daily. 90  tablet 3   losartan (COZAAR) 50 MG tablet Take 1 tablet by mouth once daily 90 tablet 2   MOUNJARO 7.5 MG/0.5ML Pen INJECT 7.5MG  INTO THE SKIN ONCE A WEEK 4 mL 0   Multiple Vitamin (MULTIVITAMIN WITH MINERALS) TABS tablet Take 1 tablet by mouth daily.      Multiple Vitamins-Minerals (EMERGEN-C IMMUNE PO) Take 1 tablet by mouth daily.      rivaroxaban (XARELTO) 20 MG TABS tablet TAKE 1 TABLET BY MOUTH ONCE DAILY WITH SUPPER 30 tablet 5   tirzepatide (MOUNJARO) 10 MG/0.5ML Pen Inject 10 mg  into the skin once a week. 2 mL 0   CINNAMON PO Take 1 capsule by mouth daily.     Ginger, Zingiber officinalis, (GINGER EXTRACT PO) Take 1 tablet by mouth daily.      No facility-administered medications prior to visit.    No Known Allergies  ROS Review of Systems  Constitutional:  Negative for chills and fever.  Eyes:  Negative for visual disturbance.  Respiratory:  Negative for chest tightness and shortness of breath.   Neurological:  Negative for dizziness and headaches.      Objective:    Physical Exam HENT:     Head: Normocephalic.     Mouth/Throat:     Mouth: Mucous membranes are moist.  Cardiovascular:     Rate and Rhythm: Normal rate.     Heart sounds: Normal heart sounds.  Pulmonary:     Effort: Pulmonary effort is normal.     Breath sounds: Normal breath sounds.  Neurological:     Mental Status: She is alert.     BP 138/82 (BP Location: Left Arm)   Pulse 72   Ht 5\' 6"  (1.676 m)   Wt (!) 319 lb 1.9 oz (144.8 kg)   SpO2 96%   BMI 51.51 kg/m  Wt Readings from Last 3 Encounters:  11/19/23 (!) 319 lb 1.9 oz (144.8 kg)  08/10/23 (!) 348 lb 0.6 oz (157.9 kg)  07/26/23 (!) 351 lb (159.2 kg)    Lab Results  Component Value Date   TSH 1.630 11/19/2023   Lab Results  Component Value Date   WBC 4.6 11/19/2023   HGB 12.9 11/19/2023   HCT 40.4 11/19/2023   MCV 89 11/19/2023   PLT 165 11/19/2023   Lab Results  Component Value Date   NA 143 11/19/2023   K 4.2  11/19/2023   CO2 25 11/19/2023   GLUCOSE 93 11/19/2023   BUN 22 11/19/2023   CREATININE 1.79 (H) 11/19/2023   BILITOT 0.5 11/19/2023   ALKPHOS 54 11/19/2023   AST 18 11/19/2023   ALT 18 11/19/2023   PROT 6.8 11/19/2023   ALBUMIN 4.1 11/19/2023   CALCIUM 9.3 11/19/2023   ANIONGAP 11 06/07/2023   EGFR 31 (L) 11/19/2023   GFR 37.19 (L) 03/03/2015   Lab Results  Component Value Date   CHOL 230 (H) 11/19/2023   Lab Results  Component Value Date   HDL 59 11/19/2023   Lab Results  Component Value Date   LDLCALC 157 (H) 11/19/2023   Lab Results  Component Value Date   TRIG 79 11/19/2023   Lab Results  Component Value Date   CHOLHDL 3.9 11/19/2023   Lab Results  Component Value Date   HGBA1C 6.1 (H) 11/19/2023      Assessment & Plan:  Type 2 diabetes mellitus with chronic kidney disease, without long-term current use of insulin, unspecified CKD stage (HCC) Assessment & Plan: The patient is currently on Mounjaro 10 mg weekly and denies symptoms of polyuria, polyphagia, or polydipsia. The patient has lost 29 pounds since starting therapy and reports doing well with no side effects such as nausea, vomiting, or constipation. I encouraged her to decrease her intake of high-sugar foods and beverages and to increase physical activity for optimal results. Hemoglobin A1c results are pending.   Orders: -     Hemoglobin A1c  Hyperlipidemia associated with type 2 diabetes mellitus (HCC) Assessment & Plan: The patient was taking atorvastatin 10 mg daily but reports discontinuing the medication due to unpleasant  side effects. She mentioned that she has since implemented lifestyle changes.  I advised the patient to reduce her intake of saturated fats, trans fats, and cholesterol, and to increase her intake of fruits, vegetables, whole grains, and omega-3 fatty acids, along with increasing her physical activity.   Pending lipid panel  Orders: -     Lipid panel -     CMP14+EGFR -      CBC with Differential/Platelet  Hypothyroidism due to medication Assessment & Plan: The patient currently takes Synthroid 150 mcg daily. She reports gradual hair loss and inquired whether she can take biotin with her thyroid medication. I informed the patient that she can take biotin to stimulate hair growth alongside her Synthroid. However, I recommend discontinuing biotin therapy 48 to 72 hours before a thyroid function test to avoid affecting the results. The patient verbalized understanding.   Orders: -     TSH + free T4  Seasonal allergic rhinitis, unspecified trigger Assessment & Plan: Chronic symptoms Has been using Zyrtec over-the-counter along with Flonase, but mentions that her Zyrtec is no longer working as effectively. I encouraged her to discontinue Zyrtec and will prescribe Xyzal 5 mg to take every evening. The patient was also encouraged to follow up if her symptoms worsen or fail to improve.   Orders: -     Levocetirizine Dihydrochloride; Take 1 tablet (5 mg total) by mouth every evening.  Dispense: 30 tablet; Refill: 2  Vitamin D deficiency -     VITAMIN D 25 Hydroxy (Vit-D Deficiency, Fractures)  Note: This chart has been completed using Engineer, civil (consulting) software, and while attempts have been made to ensure accuracy, certain words and phrases may not be transcribed as intended.    Follow-up: Return in about 4 months (around 03/19/2024).   Gilmore Laroche, FNP

## 2023-11-19 NOTE — Telephone Encounter (Signed)
Prescription refill request for Xarelto received.  Indication: AF Last office visit: 07/02/23  Isabella Carbo MD Weight: 158.8kg Age: 67 Scr: 1.85 on 06/07/23  Epic CrCl: 76

## 2023-11-19 NOTE — Patient Instructions (Signed)
I appreciate the opportunity to provide care to you today!    Follow up:  4 months  Labs: please stop by the lab today to get your blood drawn (CBC, CMP, TSH, Lipid profile, HgA1c, Vit D)   Attached with your AVS, you will find valuable resources for self-education. I highly recommend dedicating some time to thoroughly examine them.   Please continue to a heart-healthy diet and increase your physical activities. Try to exercise for 30mins at least five days a week.    It was a pleasure to see you and I look forward to continuing to work together on your health and well-being. Please do not hesitate to call the office if you need care or have questions about your care.  In case of emergency, please visit the Emergency Department for urgent care, or contact our clinic at [phone] to schedule an appointment. We're here to help you!   Have a wonderful day and week. With Gratitude, Kylan Veach MSN, FNP-BC  

## 2023-11-20 DIAGNOSIS — J302 Other seasonal allergic rhinitis: Secondary | ICD-10-CM | POA: Insufficient documentation

## 2023-11-20 LAB — TSH+FREE T4
Free T4: 2.02 ng/dL — ABNORMAL HIGH (ref 0.82–1.77)
TSH: 1.63 u[IU]/mL (ref 0.450–4.500)

## 2023-11-20 LAB — LIPID PANEL
Chol/HDL Ratio: 3.9 {ratio} (ref 0.0–4.4)
Cholesterol, Total: 230 mg/dL — ABNORMAL HIGH (ref 100–199)
HDL: 59 mg/dL (ref >39)
LDL Chol Calc (NIH): 157 mg/dL — ABNORMAL HIGH (ref 0–99)
Triglycerides: 79 mg/dL (ref 0–149)
VLDL Cholesterol Cal: 14 mg/dL (ref 5–40)

## 2023-11-20 LAB — CBC WITH DIFFERENTIAL/PLATELET
Basophils Absolute: 0 10*3/uL (ref 0.0–0.2)
Basos: 0 %
EOS (ABSOLUTE): 0.2 10*3/uL (ref 0.0–0.4)
Eos: 5 %
Hematocrit: 40.4 % (ref 34.0–46.6)
Hemoglobin: 12.9 g/dL (ref 11.1–15.9)
Immature Grans (Abs): 0 10*3/uL (ref 0.0–0.1)
Immature Granulocytes: 0 %
Lymphocytes Absolute: 1.3 10*3/uL (ref 0.7–3.1)
Lymphs: 27 %
MCH: 28.5 pg (ref 26.6–33.0)
MCHC: 31.9 g/dL (ref 31.5–35.7)
MCV: 89 fL (ref 79–97)
Monocytes Absolute: 0.3 10*3/uL (ref 0.1–0.9)
Monocytes: 7 %
Neutrophils Absolute: 2.8 10*3/uL (ref 1.4–7.0)
Neutrophils: 61 %
Platelets: 165 10*3/uL (ref 150–450)
RBC: 4.53 x10E6/uL (ref 3.77–5.28)
RDW: 13.8 % (ref 11.7–15.4)
WBC: 4.6 10*3/uL (ref 3.4–10.8)

## 2023-11-20 LAB — CMP14+EGFR
ALT: 18 [IU]/L (ref 0–32)
AST: 18 [IU]/L (ref 0–40)
Albumin: 4.1 g/dL (ref 3.9–4.9)
Alkaline Phosphatase: 54 [IU]/L (ref 44–121)
BUN/Creatinine Ratio: 12 (ref 12–28)
BUN: 22 mg/dL (ref 8–27)
Bilirubin Total: 0.5 mg/dL (ref 0.0–1.2)
CO2: 25 mmol/L (ref 20–29)
Calcium: 9.3 mg/dL (ref 8.7–10.3)
Chloride: 104 mmol/L (ref 96–106)
Creatinine, Ser: 1.79 mg/dL — ABNORMAL HIGH (ref 0.57–1.00)
Globulin, Total: 2.7 g/dL (ref 1.5–4.5)
Glucose: 93 mg/dL (ref 70–99)
Potassium: 4.2 mmol/L (ref 3.5–5.2)
Sodium: 143 mmol/L (ref 134–144)
Total Protein: 6.8 g/dL (ref 6.0–8.5)
eGFR: 31 mL/min/{1.73_m2} — ABNORMAL LOW (ref >59)

## 2023-11-20 LAB — HEMOGLOBIN A1C
Est. average glucose Bld gHb Est-mCnc: 128 mg/dL
Hgb A1c MFr Bld: 6.1 % — ABNORMAL HIGH (ref 4.8–5.6)

## 2023-11-20 LAB — VITAMIN D 25 HYDROXY (VIT D DEFICIENCY, FRACTURES): Vit D, 25-Hydroxy: 53.7 ng/mL (ref 30.0–100.0)

## 2023-11-20 NOTE — Assessment & Plan Note (Signed)
Chronic symptoms Has been using Zyrtec over-the-counter along with Flonase, but mentions that her Zyrtec is no longer working as effectively. I encouraged her to discontinue Zyrtec and will prescribe Xyzal 5 mg to take every evening. The patient was also encouraged to follow up if her symptoms worsen or fail to improve.

## 2023-11-20 NOTE — Assessment & Plan Note (Signed)
The patient is currently on Mounjaro 10 mg weekly and denies symptoms of polyuria, polyphagia, or polydipsia. The patient has lost 29 pounds since starting therapy and reports doing well with no side effects such as nausea, vomiting, or constipation. I encouraged her to decrease her intake of high-sugar foods and beverages and to increase physical activity for optimal results. Hemoglobin A1c results are pending.

## 2023-11-20 NOTE — Assessment & Plan Note (Addendum)
The patient currently takes Synthroid 150 mcg daily. She reports gradual hair loss and inquired whether she can take biotin with her thyroid medication. I informed the patient that she can take biotin to stimulate hair growth alongside her Synthroid. However, I recommend discontinuing biotin therapy 48 to 72 hours before a thyroid function test to avoid affecting the results. The patient verbalized understanding.

## 2023-11-20 NOTE — Assessment & Plan Note (Signed)
The patient was taking atorvastatin 10 mg daily but reports discontinuing the medication due to unpleasant side effects. She mentioned that she has since implemented lifestyle changes.  I advised the patient to reduce her intake of saturated fats, trans fats, and cholesterol, and to increase her intake of fruits, vegetables, whole grains, and omega-3 fatty acids, along with increasing her physical activity.   Pending lipid panel

## 2023-11-23 ENCOUNTER — Other Ambulatory Visit: Payer: Self-pay | Admitting: Family Medicine

## 2023-11-23 DIAGNOSIS — E032 Hypothyroidism due to medicaments and other exogenous substances: Secondary | ICD-10-CM

## 2023-11-23 DIAGNOSIS — E1169 Type 2 diabetes mellitus with other specified complication: Secondary | ICD-10-CM

## 2023-11-23 MED ORDER — EZETIMIBE 10 MG PO TABS
10.0000 mg | ORAL_TABLET | Freq: Every day | ORAL | 3 refills | Status: DC
Start: 2023-11-23 — End: 2024-07-17

## 2023-11-23 NOTE — Progress Notes (Signed)
The 10-year ASCVD risk score (Arnett DK, et al., 2019) is: 16.7%   Values used to calculate the score:     Age: 67 years     Sex: Female     Is Non-Hispanic African American: No     Diabetic: Yes     Tobacco smoker: No     Systolic Blood Pressure: 138 mmHg     Is BP treated: Yes     HDL Cholesterol: 59 mg/dL     Total Cholesterol: 230 mg/dL

## 2023-11-29 ENCOUNTER — Other Ambulatory Visit: Payer: Self-pay | Admitting: Family Medicine

## 2023-11-29 DIAGNOSIS — E039 Hypothyroidism, unspecified: Secondary | ICD-10-CM

## 2023-11-30 ENCOUNTER — Other Ambulatory Visit: Payer: Self-pay | Admitting: Internal Medicine

## 2023-11-30 DIAGNOSIS — E1122 Type 2 diabetes mellitus with diabetic chronic kidney disease: Secondary | ICD-10-CM

## 2023-11-30 NOTE — Telephone Encounter (Signed)
Spoke to patient, tolerates Mounjaro 10 mg once week dose well. Lost 30 lbs and 3 moths ago A1c 8 and recent A1c 6.5. ready to up the dose to 12.5 mg once a week. Prescription sent to preferred pharmacy.

## 2023-12-31 ENCOUNTER — Other Ambulatory Visit: Payer: Self-pay | Admitting: Internal Medicine

## 2023-12-31 DIAGNOSIS — E1122 Type 2 diabetes mellitus with diabetic chronic kidney disease: Secondary | ICD-10-CM

## 2024-01-01 ENCOUNTER — Encounter: Payer: Self-pay | Admitting: Pharmacist

## 2024-01-01 NOTE — Telephone Encounter (Signed)
 Message sent to pt about dose

## 2024-01-14 ENCOUNTER — Encounter: Payer: Self-pay | Admitting: Internal Medicine

## 2024-01-14 ENCOUNTER — Ambulatory Visit: Payer: HMO | Attending: Internal Medicine | Admitting: Internal Medicine

## 2024-01-14 VITALS — BP 130/88 | HR 73 | Ht 66.0 in | Wt 301.8 lb

## 2024-01-14 DIAGNOSIS — I480 Paroxysmal atrial fibrillation: Secondary | ICD-10-CM | POA: Diagnosis not present

## 2024-01-14 NOTE — Progress Notes (Signed)
Cardiology Office Note  Date: 01/14/2024   ID: Isabella Hall, Isabella Hall 10-17-1956, MRN [id number]  PCP:  Gilmore Laroche, FNP  Cardiologist:  None Electrophysiologist:  Hillis Range, MD (Inactive)    Patient is here for follow-up visit with me. No interval ER visits or hospitalizations. Patient denied any rest or exertional chest discomfort, tightness, heaviness or pressure, rest or exertional dyspnea (can walk for one mile at their own pace before getting SOB), palpitations, light-headedness, syncope and LE swelling. Compliant with medications and no side-effects. No bleeding complications.   Does not want to do OSA evaluation.  Started Ozempic, lost a lot of weight.  Amiodarone had been discontinued previously due to significantly elevated TSH.  Once amiodarone was discontinued and levothyroxine was started, TSH normalized, 1.6 in 11/2023.   Past Medical History:  Diagnosis Date   Atrial fibrillation (HCC)     CHADSVASC score 4   CKD (chronic kidney disease)    Essential hypertension    Hypothyroidism    Obesity    PONV (postoperative nausea and vomiting)    Type 2 diabetes, diet controlled (HCC)    Typical atrial flutter (HCC) 09/2020    Past Surgical History:  Procedure Laterality Date   APPENDECTOMY  1979   ATRIAL FIBRILLATION ABLATION  03/26/2018   ATRIAL FIBRILLATION ABLATION N/A 03/26/2018   Procedure: ATRIAL FIBRILLATION ABLATION;  Surgeon: Hillis Range, MD;  Location: [facility] LAB;  Service: Cardiovascular;  Laterality: N/A;   CARDIOVERSION N/A 01/25/2015   Procedure: CARDIOVERSION;  Surgeon: Jonelle Sidle, MD;  Location: [facility];  Service: Cardiovascular;  Laterality: N/A;   CARDIOVERSION N/A 10/28/2020   Procedure: CARDIOVERSION;  Surgeon: Jonelle Sidle, MD;  Location: [facility];  Service: Cardiovascular;  Laterality: N/A;   CARDIOVERSION N/A 06/10/2021   Procedure: CARDIOVERSION;  Surgeon: Chilton Si, MD;  Location: [facility] ENDOSCOPY;  Service:  Cardiovascular;  Laterality: N/A;   CARDIOVERSION N/A 09/15/2022   Procedure: CARDIOVERSION;  Surgeon: Christell Constant, MD;  Location: [facility]S;  Service: Cardiovascular;  Laterality: N/A;   CHOLECYSTECTOMY OPEN  1979   COLONOSCOPY WITH PROPOFOL N/A 06/16/2022   Procedure: COLONOSCOPY WITH PROPOFOL;  Surgeon: Lanelle Bal, DO;  Location: [facility];  Service: Endoscopy;  Laterality: N/A;  3:00pm   LEFT HEART CATHETERIZATION WITH CORONARY ANGIOGRAM N/A 01/27/2015   Procedure: LEFT HEART CATHETERIZATION WITH CORONARY ANGIOGRAM;  Surgeon: Lesleigh Noe, MD;  Location: [facility] CATH LAB;  Service: Cardiovascular;  Laterality: N/A;   TEE WITHOUT CARDIOVERSION N/A 01/25/2015   Procedure: TRANSESOPHAGEAL ECHOCARDIOGRAM (TEE);  Surgeon: Jonelle Sidle, MD;  Location: [facility];  Service: Cardiovascular;  Laterality: N/A;   TUBAL LIGATION  1980    Current Outpatient Medications  Medication Sig Dispense Refill   carvedilol (COREG) 25 MG tablet Take 1 tablet (25 mg total) by mouth 2 (two) times daily with a meal. 180 tablet 1   cholecalciferol (VITAMIN D3) 25 MCG (1000 UNIT) tablet Take 1,000 Units by mouth daily.     diltiazem (CARDIZEM SR) 120 MG 12 hr capsule Take 1 capsule by mouth twice daily 180 capsule 2   ezetimibe (ZETIA) 10 MG tablet Take 1 tablet (10 mg total) by mouth daily. 90 tablet 3   GARLIC PO Take 1 tablet by mouth daily.     levocetirizine (XYZAL) 5 MG tablet Take 1 tablet (5 mg total) by mouth every evening. 30 tablet 2   levothyroxine (SYNTHROID) 150 MCG tablet Take 1 tablet by mouth once  daily 90 tablet 0   losartan (COZAAR) 50 MG tablet Take 1 tablet by mouth once daily 90 tablet 2   Multiple Vitamin (MULTIVITAMIN WITH MINERALS) TABS tablet Take 1 tablet by mouth daily.      Multiple Vitamins-Minerals (EMERGEN-C IMMUNE PO) Take 1 tablet by mouth daily.      rivaroxaban (XARELTO) 20 MG TABS tablet TAKE 1 TABLET BY MOUTH ONCE DAILY WITH SUPPER 30 tablet 5   tirzepatide  (MOUNJARO) 15 MG/0.5ML Pen Inject 15 mg into the skin once a week. 2 mL 11   No current facility-administered medications for this visit.   Allergies:  Patient has no known allergies.   Social History: The patient  reports that she has never smoked. She has never used smokeless tobacco. She reports that she does not drink alcohol and does not use drugs.   Family History: The patient's family history includes Cancer in her maternal grandmother and mother; Colon cancer in her mother; Coronary artery disease in her sister; Diabetes type II in her mother and sister; Heart disease in her father and sister; Hypertension in an other family member; Kidney disease in her sister; Leukemia in her nephew.   ROS:  Please see the history of present illness. Otherwise, complete review of systems is positive for none.  All other systems are reviewed and negative.   Physical Exam: VS:  Wt (!) 301 lb 12.8 oz (136.9 kg)   BMI 48.71 kg/m , BMI Body mass index is 48.71 kg/m.  Wt Readings from Last 3 Encounters:  01/14/24 (!) 301 lb 12.8 oz (136.9 kg)  11/19/23 (!) 319 lb 1.9 oz (144.8 kg)  08/10/23 (!) 348 lb 0.6 oz (157.9 kg)    General: Patient appears comfortable at rest. HEENT: Conjunctiva and lids normal, oropharynx clear with moist mucosa. Neck: Supple, no elevated JVP or carotid bruits, no thyromegaly. Lungs: Clear to auscultation, nonlabored breathing at rest. Cardiac: Regular rate and rhythm, no S3 or significant systolic murmur, no pericardial rub. Abdomen: Soft, nontender, no hepatomegaly, bowel sounds present, no guarding or rebound. Extremities: No pitting edema, distal pulses 2+. Skin: Warm and dry. Musculoskeletal: No kyphosis. Neuropsychiatric: Alert and oriented x3, affect grossly appropriate.  ECG:  NSR  Recent Labwork: 11/19/2023: ALT 18; AST 18; BUN 22; Creatinine, Ser 1.79; Hemoglobin 12.9; Platelets 165; Potassium 4.2; Sodium 143; TSH 1.630     Component Value Date/Time   CHOL  230 (H) 11/19/2023 1417   TRIG 79 11/19/2023 1417   HDL 59 11/19/2023 1417   CHOLHDL 3.9 11/19/2023 1417   CHOLHDL 3.5 06/07/2023 1529   VLDL 22 06/07/2023 1529   LDLCALC 157 (H) 11/19/2023 1417   LDLCALC 149 (H) 04/06/2022 1607    Other Studies Reviewed Today:   Assessment and Plan:   # Afib s/p ablation in 2019, Afib s/p DCCV in 08/2022, currently in NSR -Did not tolerate Tikosyn due to prolonged QTc interval in the past, did not tolerate amiodarone due to new development of severe hypothyroidism in the past, currently on levothyroxine supplements.  PFTs from 7/24 showed mild COPD, follows with PCP.  EKG today showed normal sinus rhythm, HR 69 bpm.  Did not have any interval ER visits or hospitalizations for A-fib.  No interval palpitations.  Continue current rate controlling agents, carvedilol 25 mg twice daily and diltiazem 120 mg twice daily.  Continue Xarelto 20 mg nightly.  She lost 50 pounds of weight after following a heart healthy diet (cut back on sodas, sweets, carbohydrates etc.) and  Ozempic.  Congratulated and encouraged more weight loss.  She sleeps well and also lost significant amount of weight, will hold off on OSA evaluation.  # Morbid obesity and diabetes mellitus type 2:  She lost 50 pounds of weight after following a heart healthy diet (cut back on sodas, sweets, carbohydrates etc.) and Ozempic.  Congratulated and encouraged more weight loss.  Follows with PCP for management of diabetes mellitus type 2.  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  Asymptomatic, no intervention.  # Mild to moderate MR in 2021 completely resolved on the current echocardiogram from 5/24.  Asymptomatic, no intervention.   Medication Adjustments/Labs and Tests Ordered: Current medicines are reviewed at length with the patient today.  Concerns regarding medicines are outlined above.   Tests Ordered: Orders Placed This Encounter  Procedures   EKG 12-Lead     Medication Changes: No orders of the defined types were placed in this encounter.   Disposition:  Follow up 1 year  Signed, Johnjoseph Rolfe Verne Spurr, MD, 01/14/2024 8:47 AM    [facility] [address] [place], [place] [address]

## 2024-01-14 NOTE — Patient Instructions (Signed)
Medication Instructions:  ?Your physician recommends that you continue on your current medications as directed. Please refer to the Current Medication list given to you today. ? ? ?Labwork: ?None today ? ?Testing/Procedures: ?None  ? ?Follow-Up: ? ?1 year ? ?Any Other Special Instructions Will Be Listed Below (If Applicable). ? ?If you need a refill on your cardiac medications before your next appointment, please call your pharmacy. ? ?

## 2024-02-21 ENCOUNTER — Other Ambulatory Visit: Payer: Self-pay | Admitting: Family Medicine

## 2024-02-21 DIAGNOSIS — J302 Other seasonal allergic rhinitis: Secondary | ICD-10-CM

## 2024-02-23 ENCOUNTER — Other Ambulatory Visit: Payer: Self-pay | Admitting: Family Medicine

## 2024-02-23 DIAGNOSIS — E039 Hypothyroidism, unspecified: Secondary | ICD-10-CM

## 2024-02-25 ENCOUNTER — Other Ambulatory Visit: Payer: Self-pay

## 2024-02-25 MED ORDER — CARVEDILOL 25 MG PO TABS: 25.0000 mg | ORAL_TABLET | Freq: Two times a day (BID) | ORAL | 3 refills | Status: DC

## 2024-03-24 ENCOUNTER — Ambulatory Visit (INDEPENDENT_AMBULATORY_CARE_PROVIDER_SITE_OTHER): Payer: HMO | Admitting: Family Medicine

## 2024-03-24 ENCOUNTER — Encounter: Payer: Self-pay | Admitting: Family Medicine

## 2024-03-24 VITALS — BP 139/87 | HR 77 | Ht 66.0 in | Wt 284.0 lb

## 2024-03-24 DIAGNOSIS — E559 Vitamin D deficiency, unspecified: Secondary | ICD-10-CM | POA: Diagnosis not present

## 2024-03-24 DIAGNOSIS — E1165 Type 2 diabetes mellitus with hyperglycemia: Secondary | ICD-10-CM | POA: Diagnosis not present

## 2024-03-24 DIAGNOSIS — R7301 Impaired fasting glucose: Secondary | ICD-10-CM | POA: Diagnosis not present

## 2024-03-24 DIAGNOSIS — E038 Other specified hypothyroidism: Secondary | ICD-10-CM

## 2024-03-24 DIAGNOSIS — M1712 Unilateral primary osteoarthritis, left knee: Secondary | ICD-10-CM

## 2024-03-24 DIAGNOSIS — E039 Hypothyroidism, unspecified: Secondary | ICD-10-CM | POA: Diagnosis not present

## 2024-03-24 DIAGNOSIS — E7849 Other hyperlipidemia: Secondary | ICD-10-CM | POA: Diagnosis not present

## 2024-03-24 DIAGNOSIS — Z7985 Long-term (current) use of injectable non-insulin antidiabetic drugs: Secondary | ICD-10-CM | POA: Diagnosis not present

## 2024-03-24 DIAGNOSIS — Z13820 Encounter for screening for osteoporosis: Secondary | ICD-10-CM

## 2024-03-24 DIAGNOSIS — J302 Other seasonal allergic rhinitis: Secondary | ICD-10-CM | POA: Diagnosis not present

## 2024-03-24 DIAGNOSIS — Z1231 Encounter for screening mammogram for malignant neoplasm of breast: Secondary | ICD-10-CM

## 2024-03-24 MED ORDER — LEVOCETIRIZINE DIHYDROCHLORIDE 5 MG PO TABS: 5.0000 mg | ORAL_TABLET | Freq: Every evening | ORAL | 0 refills | Status: DC

## 2024-03-24 NOTE — Progress Notes (Unsigned)
 Established Patient Office Visit  Subjective:  Patient ID: Isabella Hall, female    DOB: 1956/09/01  Age: 68 y.o. MRN: [id number]  CC:  Chief Complaint  Patient presents with   Medical Management of Chronic Issues    4 month f/u  Since pt. Has started exercising for wt. Loss she has been experiencing knee pain.  Would like handicap placard     HPI Isabella Hall is a 68 y.o. female with past medical history of hyperlipidemia, type 2 diabetes, obesity presents for f/u of chronic medical conditions.  For the details of today's visit, please refer to the assessment and plan.    Left knee pain: The patient presents with left knee pain without any recent history of injury or trauma. She reports a history of receiving joint injections in the affected knee several years ago. Currently, she denies any redness, swelling, or warmth over the joint. She notes that the pain has become more noticeable with her recent weight loss.   Past Medical History:  Diagnosis Date   Atrial fibrillation (HCC)     CHADSVASC score 4   CKD (chronic Hall disease)    Essential hypertension    Hypothyroidism    Obesity    PONV (postoperative nausea and vomiting)    Type 2 diabetes, diet controlled (HCC)    Typical atrial flutter (HCC) 09/2020    Past Surgical History:  Procedure Laterality Date   APPENDECTOMY  1979   ATRIAL FIBRILLATION ABLATION  03/26/2018   ATRIAL FIBRILLATION ABLATION N/A 03/26/2018   Procedure: ATRIAL FIBRILLATION ABLATION;  Surgeon: Hillis Range, MD;  Location: [facility];  Service: Cardiovascular;  Laterality: N/A;   CARDIOVERSION N/A 01/25/2015   Procedure: CARDIOVERSION;  Surgeon: Jonelle Sidle, MD;  Location: [facility];  Service: Cardiovascular;  Laterality: N/A;   CARDIOVERSION N/A 10/28/2020   Procedure: CARDIOVERSION;  Surgeon: Jonelle Sidle, MD;  Location: [facility];  Service: Cardiovascular;  Laterality: N/A;   CARDIOVERSION N/A 06/10/2021   Procedure:  CARDIOVERSION;  Surgeon: Chilton Si, MD;  Location: [facility] ENDOSCOPY;  Service: Cardiovascular;  Laterality: N/A;   CARDIOVERSION N/A 09/15/2022   Procedure: CARDIOVERSION;  Surgeon: Christell Constant, MD;  Location: [facility];  Service: Cardiovascular;  Laterality: N/A;   CHOLECYSTECTOMY OPEN  1979   COLONOSCOPY WITH PROPOFOL N/A 06/16/2022   Procedure: COLONOSCOPY WITH PROPOFOL;  Surgeon: Lanelle Bal, DO;  Location: [facility];  Service: Endoscopy;  Laterality: N/A;  3:00pm   LEFT HEART CATHETERIZATION WITH CORONARY ANGIOGRAM N/A 01/27/2015   Procedure: LEFT HEART CATHETERIZATION WITH CORONARY ANGIOGRAM;  Surgeon: Lesleigh Noe, MD;  Location: [facility] CATH LAB;  Service: Cardiovascular;  Laterality: N/A;   TEE WITHOUT CARDIOVERSION N/A 01/25/2015   Procedure: TRANSESOPHAGEAL ECHOCARDIOGRAM (TEE);  Surgeon: Jonelle Sidle, MD;  Location: [facility];  Service: Cardiovascular;  Laterality: N/A;   TUBAL LIGATION  1980    Family History  Problem Relation Age of Onset   Cancer Mother    Colon cancer Mother        in 69s   Diabetes type II Mother    Heart disease Father    Coronary artery disease Sister        died of an MI in her 69's   Heart disease Sister        dies at age 23   Diabetes type II Sister    Hall disease Sister    Cancer Maternal Grandmother        ?  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 .   Social Drivers of Corporate investment banker Strain: Low Risk  (11/18/2023)   Overall Financial Resource Strain (CARDIA)    Difficulty of Paying Living Expenses: Not hard at all  Food Insecurity: No Food Insecurity (11/18/2023)   Hunger Vital Sign    Worried About Running Out of Food in the Last Year: Never true    Ran Out of Food in the Last Year: Never true  Transportation Needs: No Transportation Needs (11/18/2023)   PRAPARE - Administrator, Civil Service (Medical): No    Lack of Transportation (Non-Medical): No  Physical Activity: Insufficiently Active (11/18/2023)   Exercise Vital Sign    Days of Exercise per Week: 5 days    Minutes of Exercise per Session: 20 min  Stress: No Stress Concern Present (11/18/2023)   [employer] of Occupational Health - Occupational Stress Questionnaire    Feeling of Stress : Not at all  Social Connections: Moderately Integrated (11/18/2023)   Social Connection and Isolation Panel [NHANES]    Frequency of Communication with Friends and Family: More than three times a week    Frequency of Social Gatherings with Friends and Family: More than three times a week    Attends Religious Services: More than 4 times per year    Active Member of [employer] or Organizations: Yes    Attends Engineer, structural: More than 4 times per year    Marital Status: Divorced  Intimate Partner Violence: Not At Risk (05/10/2023)   Humiliation, Afraid, Rape, and Kick questionnaire    Fear of Current or Ex-Partner: No    Emotionally Abused: No    Physically Abused: No    Sexually Abused: No    Outpatient Medications Prior to Visit  Medication Sig Dispense Refill   carvedilol (COREG) 25 MG tablet Take 1 tablet (25 mg total) by mouth 2 (two) times daily with a meal.  180 tablet 3   cholecalciferol (VITAMIN D3) 25 MCG (1000 UNIT) tablet Take 1,000 Units by mouth daily.     diltiazem (CARDIZEM SR) 120 MG 12 hr capsule Take 1 capsule by mouth twice daily 180 capsule 2   ezetimibe (ZETIA) 10 MG tablet Take 1 tablet (10 mg total) by mouth daily. 90 tablet 3   GARLIC PO Take 1 tablet by mouth daily.     levothyroxine (SYNTHROID) 150 MCG tablet Take 1 tablet by mouth once daily 90 tablet 0   Multiple Vitamin (MULTIVITAMIN WITH MINERALS) TABS tablet Take 1 tablet by mouth daily.  Multiple Vitamins-Minerals (EMERGEN-C IMMUNE PO) Take 1 tablet by mouth daily.      rivaroxaban (XARELTO) 20 MG TABS tablet TAKE 1 TABLET BY MOUTH ONCE DAILY WITH SUPPER 30 tablet 5   tirzepatide (MOUNJARO) 15 MG/0.5ML Pen Inject 15 mg into the skin once a week. 2 mL 11   levocetirizine (XYZAL) 5 MG tablet TAKE 1 TABLET BY MOUTH ONCE DAILY IN THE EVENING 30 tablet 0   losartan (COZAAR) 50 MG tablet Take 1 tablet by mouth once daily 90 tablet 2   No facility-administered medications prior to visit.    No Known Allergies  ROS Review of Systems  Constitutional:  Negative for chills and fever.  Eyes:  Negative for visual disturbance.  Respiratory:  Negative for chest tightness and shortness of breath.   Musculoskeletal:        Left knee pain  Neurological:  Negative for dizziness and headaches.      Objective:    Physical Exam HENT:     Head: Normocephalic.     Mouth/Throat:     Mouth: Mucous membranes are moist.  Cardiovascular:     Rate and Rhythm: Normal rate.     Heart sounds: Normal heart sounds.  Pulmonary:     Effort: Pulmonary effort is normal.     Breath sounds: Normal breath sounds.  Musculoskeletal:     Right knee: No swelling, deformity, effusion or erythema. Normal range of motion. No tenderness.     Left knee: No swelling, deformity, effusion or erythema. Normal range of motion. No tenderness.  Neurological:     Mental Status: She is alert.     BP  139/87   Pulse 77   Ht 5\' 6"  (1.676 m)   Wt 284 lb 0.6 oz (128.8 kg)   SpO2 96%   BMI 45.85 kg/m  Wt Readings from Last 3 Encounters:  03/24/24 284 lb 0.6 oz (128.8 kg)  01/14/24 (!) 301 lb 12.8 oz (136.9 kg)  11/19/23 (!) 319 lb 1.9 oz (144.8 kg)    Lab Results  Component Value Date   TSH 0.276 (L) 03/24/2024   Lab Results  Component Value Date   WBC 5.4 03/24/2024   HGB 12.8 03/24/2024   HCT 38.1 03/24/2024   MCV 86 03/24/2024   PLT 171 03/24/2024   Lab Results  Component Value Date   NA 141 03/24/2024   K 4.2 03/24/2024   CO2 22 03/24/2024   GLUCOSE 107 (H) 03/24/2024   BUN 30 (H) 03/24/2024   CREATININE 2.03 (H) 03/24/2024   BILITOT 0.5 03/24/2024   ALKPHOS 60 03/24/2024   AST 21 03/24/2024   ALT 15 03/24/2024   PROT 6.9 03/24/2024   ALBUMIN 4.0 03/24/2024   CALCIUM 9.2 03/24/2024   ANIONGAP 11 06/07/2023   EGFR 27 (L) 03/24/2024   GFR 37.19 (L) 03/03/2015   Lab Results  Component Value Date   CHOL 210 (H) 03/24/2024   Lab Results  Component Value Date   HDL 55 03/24/2024   Lab Results  Component Value Date   LDLCALC 142 (H) 03/24/2024   Lab Results  Component Value Date   TRIG 70 03/24/2024   Lab Results  Component Value Date   CHOLHDL 3.8 03/24/2024   Lab Results  Component Value Date   HGBA1C 5.4 03/24/2024      Assessment & Plan:  Primary osteoarthritis of left knee Assessment & Plan: Referral placed to orthopedic surgery for further evaluation and management of left knee pain, likely related  to osteoarthritis. Encouraged to continue weight management to reduce joint stress. Recommended low-impact aerobic exercise, such as swimming, walking, or cycling, to improve joint mobility and function. Suggested assistive devices (e.g., knee brace or cane) if needed for support and stability. Encouraged the use of heat or cold therapy to relieve joint stiffness or pain. Educated the patient on joint protection strategies, such as avoiding  prolonged standing or high-impact activities. Encouraged to follow up If pain worsens or function declines, consider imaging or alternative therapies.   Orders: -     Ambulatory referral to Orthopedics  Type 2 diabetes mellitus with hyperglycemia, without long-term current use of insulin (HCC) Assessment & Plan: The patient is currently on Mounjaro 15 mg weekly and denies symptoms of polyuria, polyphagia, or polydipsia.  Encouraged to decrease her intake of high-sugar foods and beverages and to increase physical activity for optimal results.  Lab Results  Component Value Date   HGB 12.8 03/24/2024      Osteoporosis screening -     [facility] DEXA SCAN  Hypothyroidism, unspecified type  Seasonal allergic rhinitis, unspecified trigger -     Levocetirizine Dihydrochloride; Take 1 tablet (5 mg total) by mouth every evening.  Dispense: 30 tablet; Refill: 0  Breast cancer screening by mammogram -     3D Screening Mammogram, Left and Right  IFG (impaired fasting glucose) -     Hemoglobin A1c  Vitamin D deficiency -     VITAMIN D 25 Hydroxy (Vit-D Deficiency, Fractures)  TSH (thyroid-stimulating hormone deficiency) -     TSH + free T4  Other hyperlipidemia -     Lipid panel -     CMP14+EGFR -     CBC with Differential/Platelet   Note: This chart has been completed using Engineer, civil (consulting) software, and while attempts have been made to ensure accuracy, certain words and phrases may not be transcribed as intended.   Follow-up: Return in about 4 months (around 07/24/2024).   Gilmore Laroche, FNP

## 2024-03-24 NOTE — Patient Instructions (Addendum)
 I appreciate the opportunity to provide care to you today!    Follow up:  4 months  Fasting Labs: please stop by the lab today/ during the week to get your blood drawn (CBC, CMP, TSH, Lipid profile, HgA1c, Vit D)  Schedule mammogram Dexa scan and medicare annual wellness visit   Please continue to a heart-healthy diet and increase your physical activities. Try to exercise for at least five days a week.    It was a pleasure to see you and I look forward to continuing to work together on your health and well-being. Please do not hesitate to call the office if you need care or have questions about your care.  In case of emergency, please visit the Emergency Department for urgent care, or contact our clinic at [phone] to schedule an appointment. We're here to help you!   Have a wonderful day and week. With Gratitude, Gilmore Laroche MSN, FNP-BC

## 2024-03-25 LAB — CMP14+EGFR
ALT: 15 IU/L (ref 0–32)
AST: 21 IU/L (ref 0–40)
Albumin: 4 g/dL (ref 3.9–4.9)
Alkaline Phosphatase: 60 IU/L (ref 44–121)
BUN/Creatinine Ratio: 15 (ref 12–28)
BUN: 30 mg/dL — ABNORMAL HIGH (ref 8–27)
Bilirubin Total: 0.5 mg/dL (ref 0.0–1.2)
CO2: 22 mmol/L (ref 20–29)
Calcium: 9.2 mg/dL (ref 8.7–10.3)
Chloride: 107 mmol/L — ABNORMAL HIGH (ref 96–106)
Creatinine, Ser: 2.03 mg/dL — ABNORMAL HIGH (ref 0.57–1.00)
Globulin, Total: 2.9 g/dL (ref 1.5–4.5)
Glucose: 107 mg/dL — ABNORMAL HIGH (ref 70–99)
Potassium: 4.2 mmol/L (ref 3.5–5.2)
Sodium: 141 mmol/L (ref 134–144)
Total Protein: 6.9 g/dL (ref 6.0–8.5)
eGFR: 27 mL/min/{1.73_m2} — ABNORMAL LOW (ref >59)

## 2024-03-25 LAB — CBC WITH DIFFERENTIAL/PLATELET
Basophils Absolute: 0 10*3/uL (ref 0.0–0.2)
Basos: 1 %
EOS (ABSOLUTE): 0.3 10*3/uL (ref 0.0–0.4)
Eos: 5 %
Hematocrit: 38.1 % (ref 34.0–46.6)
Hemoglobin: 12.8 g/dL (ref 11.1–15.9)
Immature Grans (Abs): 0 10*3/uL (ref 0.0–0.1)
Immature Granulocytes: 0 %
Lymphocytes Absolute: 1.2 10*3/uL (ref 0.7–3.1)
Lymphs: 21 %
MCH: 29 pg (ref 26.6–33.0)
MCHC: 33.6 g/dL (ref 31.5–35.7)
MCV: 86 fL (ref 79–97)
Monocytes Absolute: 0.5 10*3/uL (ref 0.1–0.9)
Monocytes: 9 %
Neutrophils Absolute: 3.5 10*3/uL (ref 1.4–7.0)
Neutrophils: 64 %
Platelets: 171 10*3/uL (ref 150–450)
RBC: 4.42 x10E6/uL (ref 3.77–5.28)
RDW: 13.1 % (ref 11.7–15.4)
WBC: 5.4 10*3/uL (ref 3.4–10.8)

## 2024-03-25 LAB — VITAMIN D 25 HYDROXY (VIT D DEFICIENCY, FRACTURES): Vit D, 25-Hydroxy: 64.9 ng/mL (ref 30.0–100.0)

## 2024-03-25 LAB — LIPID PANEL
Chol/HDL Ratio: 3.8 ratio (ref 0.0–4.4)
Cholesterol, Total: 210 mg/dL — ABNORMAL HIGH (ref 100–199)
HDL: 55 mg/dL (ref >39)
LDL Chol Calc (NIH): 142 mg/dL — ABNORMAL HIGH (ref 0–99)
Triglycerides: 70 mg/dL (ref 0–149)
VLDL Cholesterol Cal: 13 mg/dL (ref 5–40)

## 2024-03-25 LAB — HEMOGLOBIN A1C
Est. average glucose Bld gHb Est-mCnc: 108 mg/dL
Hgb A1c MFr Bld: 5.4 % (ref 4.8–5.6)

## 2024-03-25 LAB — TSH+FREE T4
Free T4: 2.12 ng/dL — ABNORMAL HIGH (ref 0.82–1.77)
TSH: 0.276 u[IU]/mL — ABNORMAL LOW (ref 0.450–4.500)

## 2024-03-27 ENCOUNTER — Other Ambulatory Visit: Payer: Self-pay | Admitting: Internal Medicine

## 2024-03-27 DIAGNOSIS — M1712 Unilateral primary osteoarthritis, left knee: Secondary | ICD-10-CM | POA: Insufficient documentation

## 2024-03-27 NOTE — Assessment & Plan Note (Addendum)
 The patient is currently on Mounjaro 15 mg weekly and denies symptoms of polyuria, polyphagia, or polydipsia.  Encouraged to decrease her intake of high-sugar foods and beverages and to increase physical activity for optimal results.  Lab Results  Component Value Date   HGB 12.8 03/24/2024

## 2024-03-27 NOTE — Assessment & Plan Note (Signed)
 Referral placed to orthopedic surgery for further evaluation and management of left knee pain, likely related to osteoarthritis. Encouraged to continue weight management to reduce joint stress. Recommended low-impact aerobic exercise, such as swimming, walking, or cycling, to improve joint mobility and function. Suggested assistive devices (e.g., knee brace or cane) if needed for support and stability. Encouraged the use of heat or cold therapy to relieve joint stiffness or pain. Educated the patient on joint protection strategies, such as avoiding prolonged standing or high-impact activities. Encouraged to follow up If pain worsens or function declines, consider imaging or alternative therapies.

## 2024-04-03 ENCOUNTER — Telehealth: Payer: Self-pay | Admitting: Family Medicine

## 2024-04-03 NOTE — Telephone Encounter (Signed)
 Form put up to provider to sign, will call when ready for pick up.

## 2024-04-03 NOTE — Telephone Encounter (Signed)
 Form not at front desk, see message below.    Copied from CRM [id number]. Topic: General - Other >> Apr 03, 2024 11:36 AM Georgeann Kindred wrote: Reason for CRM: Patient called to see if she could come and pick up her for to turn in for her Handicap sticker. Please contact patient at [phone]

## 2024-04-04 NOTE — Telephone Encounter (Signed)
 Form is ready to be picked up. In tray in fax room

## 2024-04-04 NOTE — Telephone Encounter (Signed)
 Called patient she will pick up the form.

## 2024-04-14 ENCOUNTER — Ambulatory Visit: Admitting: Orthopedic Surgery

## 2024-04-14 ENCOUNTER — Other Ambulatory Visit (INDEPENDENT_AMBULATORY_CARE_PROVIDER_SITE_OTHER): Payer: Self-pay

## 2024-04-14 ENCOUNTER — Encounter: Payer: Self-pay | Admitting: Orthopedic Surgery

## 2024-04-14 VITALS — BP 118/79 | HR 82 | Ht 66.0 in | Wt 280.0 lb

## 2024-04-14 DIAGNOSIS — G8929 Other chronic pain: Secondary | ICD-10-CM | POA: Diagnosis not present

## 2024-04-14 DIAGNOSIS — M1712 Unilateral primary osteoarthritis, left knee: Secondary | ICD-10-CM

## 2024-04-14 DIAGNOSIS — Z6841 Body Mass Index (BMI) 40.0 and over, adult: Secondary | ICD-10-CM | POA: Diagnosis not present

## 2024-04-14 DIAGNOSIS — M25562 Pain in left knee: Secondary | ICD-10-CM

## 2024-04-14 MED ORDER — METHYLPREDNISOLONE ACETATE 40 MG/ML IJ SUSP
40.0000 mg | Freq: Once | INTRAMUSCULAR | Status: AC
  Administered 2024-04-14: 40 mg via INTRA_ARTICULAR
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
## 2024-04-15 ENCOUNTER — Encounter: Payer: Self-pay | Admitting: Family Medicine

## 2024-04-15 ENCOUNTER — Other Ambulatory Visit: Payer: Self-pay | Admitting: Family Medicine

## 2024-04-15 DIAGNOSIS — E039 Hypothyroidism, unspecified: Secondary | ICD-10-CM

## 2024-04-15 MED ORDER — LEVOTHYROXINE SODIUM 125 MCG PO TABS: 125.0000 ug | ORAL_TABLET | Freq: Every day | ORAL | 3 refills | Status: AC

## 2024-04-16 DIAGNOSIS — M542 Cervicalgia: Secondary | ICD-10-CM | POA: Diagnosis not present

## 2024-04-16 DIAGNOSIS — M546 Pain in thoracic spine: Secondary | ICD-10-CM | POA: Diagnosis not present

## 2024-04-16 DIAGNOSIS — M62830 Muscle spasm of back: Secondary | ICD-10-CM | POA: Diagnosis not present

## 2024-04-16 DIAGNOSIS — M9903 Segmental and somatic dysfunction of lumbar region: Secondary | ICD-10-CM | POA: Diagnosis not present

## 2024-04-16 DIAGNOSIS — M9902 Segmental and somatic dysfunction of thoracic region: Secondary | ICD-10-CM | POA: Diagnosis not present

## 2024-04-16 DIAGNOSIS — M9901 Segmental and somatic dysfunction of cervical region: Secondary | ICD-10-CM | POA: Diagnosis not present

## 2024-04-22 ENCOUNTER — Other Ambulatory Visit: Payer: Self-pay | Admitting: Family Medicine

## 2024-04-22 DIAGNOSIS — J302 Other seasonal allergic rhinitis: Secondary | ICD-10-CM

## 2024-05-13 ENCOUNTER — Ambulatory Visit (INDEPENDENT_AMBULATORY_CARE_PROVIDER_SITE_OTHER): Payer: HMO

## 2024-05-13 VITALS — Ht 66.0 in | Wt 270.0 lb

## 2024-05-13 DIAGNOSIS — Z1231 Encounter for screening mammogram for malignant neoplasm of breast: Secondary | ICD-10-CM | POA: Diagnosis not present

## 2024-05-13 DIAGNOSIS — Z0000 Encounter for general adult medical examination without abnormal findings: Secondary | ICD-10-CM | POA: Diagnosis not present

## 2024-05-13 NOTE — Patient Instructions (Signed)
 Isabella Hall , Thank you for taking time out of your busy schedule to complete your Annual Wellness Visit with me. I enjoyed our conversation and look forward to speaking with you again next year. I, as well as your care team,  appreciate your ongoing commitment to your health goals. Please review the following plan we discussed and let me know if I can assist you in the future. Your Game plan/ To Do List    Referrals: If you haven't heard from the office you've been referred to, please reach out to them at the phone number provided.  Your yearly mammogram was scheduled today during your visit. It will be on the mobile mammogram bus Appointment Date/Time: May 26, 2024 at 1:30 pm  Location: [facility] parking lot Remember to wear 2 piece comfortable clothing. Do not wear perfumes, lotions, or deodorants before your mammogram.   Follow up Visits: Next Medicare AWV with our clinical staff:   May 18, 2025 at 10:00 am video visit Have you seen your provider in the last 6 months (3 months if uncontrolled diabetes)? Yes Next Office Visit with your provider:   July 24, 2024 at 2:00 pm  Clinician Recommendations:   Aim for 30 minutes of exercise or brisk walking, 6-8 glasses of water, and 5 servings of fruits and vegetables each day.  I enjoyed our conversation today and look forward to talking with you again next year!! Have a wonderful and safe year. All the best, Eris Hannan This is a list of the screening recommended for you and due dates:  Health Maintenance  Topic Date Due   COVID-19 Vaccine (3 - Moderna risk series) 02/03/2021   Mammogram  03/02/2023   Yearly kidney health urinalysis for diabetes  06/06/2024   Eye exam for diabetics  05/15/2024   Flu Shot  07/18/2024   Hemoglobin A1C  09/23/2024   Yearly kidney function blood test for diabetes  03/24/2025   Complete foot exam   03/24/2025   Medicare Annual Wellness Visit  05/13/2025   DEXA scan (bone density measurement)  03/24/2026   DTaP/Tdap/Td  vaccine (2 - Td or Tdap) 05/01/2032   Colon Cancer Screening  06/16/2032   Pneumonia Vaccine  Completed   Hepatitis C Screening  Completed   Zoster (Shingles) Vaccine  Completed   HPV Vaccine  Aged Out   Meningitis B Vaccine  Aged Out    Advanced directives: (Declined) Advance directive discussed with you today. Even though you declined this today, please call our office should you change your mind, and we can give you the proper paperwork for you to fill out. Advance Care Planning is important because it:  [x]  Makes sure you receive the medical care that is consistent with your values, goals, and preferences  [x]  It provides guidance to your family and loved ones and reduces their decisional burden about whether or not they are making the right decisions based on your wishes.  Follow the link provided in your after visit summary or read over the paperwork we have mailed to you to help you started getting your Advance Directives in place. If you need assistance in completing these, please reach out to us  so that we can help you! See attachments for Preventive Care and Fall Prevention Tips.  Understanding Your Risk for Falls Millions of people have serious injuries from falls each year. It is important to understand your risk of falling. Talk with your health care provider about your risk and what you can do to  lower it. If you do have a serious fall, make sure to tell your provider. Falling once raises your risk of falling again. How can falls affect me? Serious injuries from falls are common. These include: Broken bones, such as hip fractures. Head injuries, such as traumatic brain injuries (TBI) or concussions. A fear of falling can cause you to avoid activities and stay at home. This can make your muscles weaker and raise your risk for a fall. What can increase my risk? There are a number of risk factors that increase your risk for falling. The more risk factors you have, the higher  your risk of falling. Serious injuries from a fall happen most often to people who are older than 66 years old. Teenagers and young adults ages 81-29 are also at higher risk. Common risk factors include: Weakness in the lower body. Being generally weak or confused due to long-term (chronic) illness. Dizziness or balance problems. Poor vision. Medicines that cause dizziness or drowsiness. These may include: Medicines for your blood pressure, heart, anxiety, insomnia, or swelling (edema). Pain medicines. Muscle relaxants. Other risk factors include: Drinking alcohol. Having had a fall in the past. Having foot pain or wearing improper footwear. Working at a dangerous job. Having any of the following in your home: Tripping hazards, such as floor clutter or loose rugs. Poor lighting. Pets. Having dementia or memory loss. What actions can I take to lower my risk of falling?     Physical activity Stay physically fit. Do strength and balance exercises. Consider taking a regular class to build strength and balance. Yoga and tai chi are good options. Vision Have your eyes checked every year and your prescription for glasses or contacts updated as needed. Shoes and walking aids Wear non-skid shoes. Wear shoes that have rubber soles and low heels. Do not wear high heels. Do not walk around the house in socks or slippers. Use a cane or walker as told by your provider. Home safety Attach secure railings on both sides of your stairs. Install grab bars for your bathtub, shower, and toilet. Use a non-skid mat in your bathtub or shower. Attach bath mats securely with double-sided, non-slip rug tape. Use good lighting in all rooms. Keep a flashlight near your bed. Make sure there is a clear path from your bed to the bathroom. Use night-lights. Do not use throw rugs. Make sure all carpeting is taped or tacked down securely. Remove all clutter from walkways and stairways, including extension  cords. Repair uneven or broken steps and floors. Avoid walking on icy or slippery surfaces. Walk on the grass instead of on icy or slick sidewalks. Use ice melter to get rid of ice on walkways in the winter. Use a cordless phone. Questions to ask your health care provider Can you help me check my risk for a fall? Do any of my medicines make me more likely to fall? Should I take a vitamin D  supplement? What exercises can I do to improve my strength and balance? Should I make an appointment to have my vision checked? Do I need a bone density test to check for weak bones (osteoporosis)? Would it help to use a cane or a walker? Where to find more information Centers for Disease Control and Prevention, STEAD[employer]: TonerPromos.no Community-Based Fall Prevention Programs: TonerPromos.no [employer] on Aging: BaseRingTones.pl Contact a health care provider if: You fall at home. You are afraid of falling at home. You feel weak, drowsy, or dizzy. This information is not intended  to replace advice given to you by your health care provider. Make sure you discuss any questions you have with your health care provider. Document Revised: 08/07/2022 Document Reviewed: 08/07/2022 Elsevier Patient Education  2024 [employer].  Understanding Your Risk for Falls Millions of people have serious injuries from falls each year. It is important to understand your risk of falling. Talk with your health care provider about your risk and what you can do to lower it. If you do have a serious fall, make sure to tell your provider. Falling once raises your risk of falling again. How can falls affect me? Serious injuries from falls are common. These include: Broken bones, such as hip fractures. Head injuries, such as traumatic brain injuries (TBI) or concussions. A fear of falling can cause you to avoid activities and stay at home. This can make your muscles weaker and raise your risk for a fall. What can increase my risk? There  are a number of risk factors that increase your risk for falling. The more risk factors you have, the higher your risk of falling. Serious injuries from a fall happen most often to people who are older than 66 years old. Teenagers and young adults ages 73-29 are also at higher risk. Common risk factors include: Weakness in the lower body. Being generally weak or confused due to long-term (chronic) illness. Dizziness or balance problems. Poor vision. Medicines that cause dizziness or drowsiness. These may include: Medicines for your blood pressure, heart, anxiety, insomnia, or swelling (edema). Pain medicines. Muscle relaxants. Other risk factors include: Drinking alcohol. Having had a fall in the past. Having foot pain or wearing improper footwear. Working at a dangerous job. Having any of the following in your home: Tripping hazards, such as floor clutter or loose rugs. Poor lighting. Pets. Having dementia or memory loss. What actions can I take to lower my risk of falling?     Physical activity Stay physically fit. Do strength and balance exercises. Consider taking a regular class to build strength and balance. Yoga and tai chi are good options. Vision Have your eyes checked every year and your prescription for glasses or contacts updated as needed. Shoes and walking aids Wear non-skid shoes. Wear shoes that have rubber soles and low heels. Do not wear high heels. Do not walk around the house in socks or slippers. Use a cane or walker as told by your provider. Home safety Attach secure railings on both sides of your stairs. Install grab bars for your bathtub, shower, and toilet. Use a non-skid mat in your bathtub or shower. Attach bath mats securely with double-sided, non-slip rug tape. Use good lighting in all rooms. Keep a flashlight near your bed. Make sure there is a clear path from your bed to the bathroom. Use night-lights. Do not use throw rugs. Make sure all carpeting  is taped or tacked down securely. Remove all clutter from walkways and stairways, including extension cords. Repair uneven or broken steps and floors. Avoid walking on icy or slippery surfaces. Walk on the grass instead of on icy or slick sidewalks. Use ice melter to get rid of ice on walkways in the winter. Use a cordless phone. Questions to ask your health care provider Can you help me check my risk for a fall? Do any of my medicines make me more likely to fall? Should I take a vitamin D  supplement? What exercises can I do to improve my strength and balance? Should I make an appointment to have  my vision checked? Do I need a bone density test to check for weak bones (osteoporosis)? Would it help to use a cane or a walker? Where to find more information Centers for Disease Control and Prevention, STE[employer]: TonerPromos.no Community-Based Fall Prevention Programs: TonerPromos.no [employer] on Aging: BaseRingTones.pl Contact a health care provider if: You fall at home. You are afraid of falling at home. You feel weak, drowsy, or dizzy. This information is not intended to replace advice given to you by your health care provider. Make sure you discuss any questions you have with your health care provider. Document Revised: 08/07/2022 Document Reviewed: 08/07/2022 Elsevier Patient Education  2024 [employer].

## 2024-05-13 NOTE — Progress Notes (Signed)
 Please attest and cosign this visit due to patients primary care provider not being in the office at the time the visit was completed.   Subjective:   Isabella Hall is a 68 y.o. who presents for a Medicare Wellness preventive visit.  Visit Complete: Virtual I connected with  Isabella Hall on 05/13/24 by a audio enabled telemedicine application and verified that I am speaking with the correct person using two identifiers.  Patient Location: Home  Provider Location: Office/Clinic  I discussed the limitations of evaluation and management by telemedicine. The patient expressed understanding and agreed to proceed.  Vital Signs: Because this visit was a virtual/telehealth visit, some criteria may be missing or patient reported. Any vitals not documented were not able to be obtained and vitals that have been documented are patient reported.  VideoDeclined- This patient declined Librarian, academic. Therefore the visit was completed with audio only.  AWV Questionnaire: No: Patient Medicare AWV questionnaire was not completed prior to this visit.  Cardiac Risk Factors include: advanced age (>69men, >47 women);diabetes mellitus;hypertension;obesity (BMI >30kg/m2);Other (see comment), Risk factor comments: AFib, CKD     Objective:     Today's Vitals   05/13/24 1129 05/13/24 1136  Weight: 270 lb (122.5 kg)   Height: 5\' 6"  (1.676 m)   PainSc:  0-No pain   Body mass index is 43.58 kg/m.     05/13/2024   11:44 AM 07/25/2023    7:46 PM 05/10/2023    2:38 PM 06/13/2022    8:45 AM 06/10/2021    9:41 AM 11/30/2020    2:00 PM 10/26/2020    3:02 PM  Advanced Directives  Does Patient Have a Medical Advance Directive? No No Yes No No No No  Does patient want to make changes to medical advance directive?   No - Patient declined      Would patient like information on creating a medical advance directive? No - Patient declined Yes (ED - Information included in AVS)  No -  Patient declined No - Patient declined No - Patient declined No - Patient declined    Current Medications (verified) Outpatient Encounter Medications as of 05/13/2024  Medication Sig   carvedilol  (COREG ) 25 MG tablet Take 1 tablet (25 mg total) by mouth 2 (two) times daily with a meal.   cholecalciferol  (VITAMIN D3) 25 MCG (1000 UNIT) tablet Take 1,000 Units by mouth daily.   diltiazem  (CARDIZEM  SR) 120 MG 12 hr capsule Take 1 capsule by mouth twice daily   ezetimibe  (ZETIA ) 10 MG tablet Take 1 tablet (10 mg total) by mouth daily.   GARLIC PO Take 1 tablet by mouth daily.   levocetirizine (XYZAL ) 5 MG tablet TAKE 1 TABLET BY MOUTH ONCE DAILY IN THE EVENING   levothyroxine  (SYNTHROID ) 125 MCG tablet Take 1 tablet (125 mcg total) by mouth daily.   losartan  (COZAAR ) 50 MG tablet Take 1 tablet by mouth once daily   Multiple Vitamin (MULTIVITAMIN WITH MINERALS) TABS tablet Take 1 tablet by mouth daily.    Multiple Vitamins-Minerals (EMERGEN-C IMMUNE PO) Take 1 tablet by mouth daily.    rivaroxaban  (XARELTO ) 20 MG TABS tablet TAKE 1 TABLET BY MOUTH ONCE DAILY WITH SUPPER   tirzepatide  (MOUNJARO ) 15 MG/0.5ML Pen Inject 15 mg into the skin once a week.   No facility-administered encounter medications on file as of 05/13/2024.    Allergies (verified) Patient has no known allergies.   History: Past Medical History:  Diagnosis Date  Atrial fibrillation (HCC)     CHADSVASC score 4   CKD (chronic kidney disease)    Essential hypertension    Hypothyroidism    Obesity    PONV (postoperative nausea and vomiting)    Type 2 diabetes, diet controlled (HCC)    Typical atrial flutter (HCC) 09/2020   Past Surgical History:  Procedure Laterality Date   APPENDECTOMY  1979   ATRIAL FIBRILLATION ABLATION  03/26/2018   ATRIAL FIBRILLATION ABLATION N/A 03/26/2018   Procedure: ATRIAL FIBRILLATION ABLATION;  Surgeon: Jolly Needle, MD;  Location: [facility] INVASIVE CV LAB;  Service: Cardiovascular;  Laterality:  N/A;   CARDIOVERSION N/A 01/25/2015   Procedure: CARDIOVERSION;  Surgeon: Gerard Knight, MD;  Location: [facility] ORS;  Service: Cardiovascular;  Laterality: N/A;   CARDIOVERSION N/A 10/28/2020   Procedure: CARDIOVERSION;  Surgeon: Gerard Knight, MD;  Location: [facility] ENDO SUITE;  Service: Cardiovascular;  Laterality: N/A;   CARDIOVERSION N/A 06/10/2021   Procedure: CARDIOVERSION;  Surgeon: Maudine Sos, MD;  Location: [facility] ENDOSCOPY;  Service: Cardiovascular;  Laterality: N/A;   CARDIOVERSION N/A 09/15/2022   Procedure: CARDIOVERSION;  Surgeon: Jann Melody, MD;  Location: [facility] ORS;  Service: Cardiovascular;  Laterality: N/A;   CHOLECYSTECTOMY OPEN  1979   COLONOSCOPY WITH PROPOFOL  N/A 06/16/2022   Procedure: COLONOSCOPY WITH PROPOFOL ;  Surgeon: Vinetta Greening, DO;  Location: [facility] ENDO SU[facility];  Service: Endoscopy;  Laterality: N/A;  3:00pm   LEFT HEART CATHETERIZATION WITH CORONARY ANGIOGRAM N/A 01/27/2015   Procedure: LEFT HEART CATHETERIZATION WITH CORONARY ANGIOGRAM;  Surgeon: Mickiel Albany, MD;  Location: [facility] CATH LAB;  Service: Cardiovascular;  Laterality: N/A;   TEE WITHOUT CARDIOVERSION N/A 01/25/2015   Procedure: TRANSESOPHAGEAL ECHOCARDIOGRAM (TEE);  Surgeon: Gerard Knight, MD;  Location: [facility] ORS;  Service: Cardiovascular;  Laterality: N/A;   TUBAL LIGATION  1980   Family History  Problem Relation Age of Onset   Cancer Mother    Colon cancer Mother        in 20s   Diabetes type II Mother    Heart disease Father    Coronary artery disease Sister        died of an MI in her 24's   Heart disease Sister        dies at age 46   Diabetes type II Sister    Kidney disease Sister    Cancer Maternal Grandmother        ?leukemia   Hypertension Other    Leukemia Nephew        30   Social History   Socioeconomic History   Marital status: Divorced    Spouse name: Not on file   Number of children: 2   Years of education: Not on file   Highest education level: Associate  degree: academic program  Occupational History   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 .   Social Drivers of Health   Financial Resource Strain: Low Risk  (05/13/2024)   Overall Financial  Resource Strain (CARDIA)    Difficulty of Paying Living Expenses: Not hard at all  Food Insecurity: No Food Insecurity (05/13/2024)   Hunger Vital Sign    Worried About Running Out of Food in the Last Year: Never true    Ran Out of Food in the Last Year: Never true  Transportation Needs: No Transportation Needs (05/13/2024)   PRAPARE - Administrator, Civil Service (Medical): No    Lack of Transportation (Non-Medical): No  Physical Activity: Sufficiently Active (05/13/2024)   Exercise Vital Sign    Days of Exercise per Week: 7 days    Minutes of Exercise per Session: 30 min  Stress: No Stress Concern Present (05/13/2024)   [employer] of Occupational Health - Occupational Stress Questionnaire    Feeling of Stress : Not at all  Social Connections: Moderately Integrated (05/13/2024)   Social Connection and Isolation Panel [NHANES]    Frequency of Communication with Friends and Family: More than three times a week    Frequency of Social Gatherings with Friends and Family: More than three times a week    Attends Religious Services: More than 4 times per year    Active Member of [employer] or Organizations: Yes    Attends Engineer, structural: More than 4 times per year    Marital Status: Divorced    Tobacco Counseling Counseling  given: Yes    Clinical Intake:  Pre-visit preparation completed: Yes  Pain : No/denies pain Pain Score: 0-No pain     BMI - recorded: 43.58 Nutritional Status: BMI > 30  Obese Nutritional Risks: None Diabetes: Yes CBG done?: No (telehealth visit) Did pt. bring in CBG monitor from home?: No  How often do you need to have someone help you when you read instructions, pamphlets, or other written materials from your doctor or pharmacy?: 1 - Never  Interpreter Needed?: No  Information entered by :: Sally Crazier CMA   Activities of Daily Living     05/13/2024   11:39 AM  In your present state of health, do you have any difficulty performing the following activities:  Hearing? 0  Vision? 0  Difficulty concentrating or making decisions? 0  Walking or climbing stairs? 0  Dressing or bathing? 0  Doing errands, shopping? 0  Preparing Food and eating ? N  Using the Toilet? N  In the past six months, have you accidently leaked urine? N  Do you have problems with loss of bowel control? N  Managing your Medications? N  Managing your Finances? N  Housekeeping or managing your Housekeeping? N    Patient Care Team: Zarwolo, Gloria, FNP as PCP - General (Family Medicine) Jolly Needle, MD (Inactive) as PCP - Electrophysiology (Cardiology)  Indicate any recent Medical Services you may have received from other than [facility] providers in the past year (date may be approximate).     Assessment:    This is a routine wellness examination for Isabella Hall.  Hearing/Vision screen Hearing Screening - Comments:: Patient denies any hearing difficulties.   Vision Screening - Comments:: Patient wears reading glasses only. Up to date with yearly exams.  Patient has retinal screenings at [facility]    Goals Addressed             This Visit's Progress    Patient Stated       Remain active and healthy and continue to spend time with family       Depression Screen  05/13/2024    11:45 AM 03/24/2024    1:48 PM 11/19/2023    1:20 PM 08/10/2023    1:24 PM 05/10/2023    2:38 PM 05/04/2023   10:40 AM 10/24/2022    3:08 PM  PHQ 2/9 Scores  PHQ - 2 Score 0 1 0 0 0 0 0  PHQ- 9 Score 0 1 0 1  2 2     Fall Risk     05/13/2024   11:41 AM 03/24/2024    1:48 PM 11/19/2023    1:25 PM 11/19/2023    1:19 PM 08/10/2023    1:24 PM  Fall Risk   Falls in the past year? 0 1 0 0 1  Number falls in past yr: 0 0 0 0 0  Injury with Fall? 0 1 0 0 1  Risk for fall due to : No Fall Risks No Fall Risks No Fall Risks No Fall Risks No Fall Risks  Follow up Falls evaluation completed Follow up appointment Falls evaluation completed Falls evaluation completed Falls evaluation completed    MEDICARE RISK AT HOME:  Medicare Risk at Home Any stairs in or around the home?: No If so, are there any without handrails?: No Home free of loose throw rugs in walkways, pet beds, electrical cords, etc?: Yes Adequate lighting in your home to reduce risk of falls?: Yes Life alert?: No Use of a cane, walker or w/c?: No Grab bars in the bathroom?: No Shower chair or bench in shower?: No Elevated toilet seat or a handicapped toilet?: No  TIMED UP AND GO:  Was the test performed?  No  Cognitive Function: 6CIT completed        05/13/2024   11:42 AM 05/10/2023    2:39 PM  6CIT Screen  What Year? 0 points 0 points  What month? 0 points 0 points  What time? 0 points 0 points  Count back from 20 0 points 0 points  Months in reverse 0 points 0 points  Repeat phrase 0 points 0 points  Total Score 0 points 0 points    Immunizations Immunization History  Administered Date(s) Administered   Influenza,inj,Quad PF,6+ Mos 09/20/2018, 11/05/2019, 10/24/2022   Influenza-Unspecified 11/05/2019, 10/01/2023   Moderna SARS-COV2 Booster Vaccination 01/06/2021   Moderna Sars-Covid-2 Vaccination 01/12/2020, 02/09/2020   PNEUMOCOCCAL CONJUGATE-20 10/08/2023   Pneumococcal Polysaccharide-23 01/21/2015   Tdap  05/01/2022   Zoster Recombinant(Shingrix) 11/05/2019, 05/01/2022    Screening Tests Health Maintenance  Topic Date Due   COVID-19 Vaccine (3 - Moderna risk series) 02/03/2021   MAMMOGRAM  03/02/2023   Diabetic kidney evaluation - Urine ACR  06/06/2024   OPHTHALMOLOGY EXAM  05/15/2024   INFLUENZA VACCINE  07/18/2024   HEMOGLOBIN A1C  09/23/2024   Diabetic kidney evaluation - eGFR measurement  03/24/2025   FOOT EXAM  03/24/2025   Medicare Annual Wellness (AWV)  05/13/2025   DEXA SCAN  03/24/2026   DTaP/Tdap/Td (2 - Td or Tdap) 05/01/2032   Colonoscopy  06/16/2032   Pneumonia Vaccine 31+ Years old  Completed   Hepatitis C Screening  Completed   Zoster Vaccines- Shingrix  Completed   HPV VACCINES  Aged Out   Meningococcal B Vaccine  Aged Out    Health Maintenance  Health Maintenance Due  Topic Date Due   COVID-19 Vaccine (3 - Moderna risk series) 02/03/2021   MAMMOGRAM  03/02/2023   Diabetic kidney evaluation - Urine ACR  06/06/2024   Health Maintenance Items Addressed: Mammogram scheduled  Additional Screening:  Vision Screening: Recommended annual ophthalmology exams for early detection of glaucoma and other disorders of the eye.  Dental Screening: Recommended annual dental exams for proper oral hygiene  Community Resource Referral / Chronic Care Management: CRR required this visit?  No   CCM required this visit?  No     Plan:     I have personally reviewed and noted the following in the patient's chart:   Medical and social history Use of alcohol, tobacco or illicit drugs  Current medications and supplements including opioid prescriptions. Patient is not currently taking opioid prescriptions. Functional ability and status Nutritional status Physical activity Advanced directives List of other physicians Hospitalizations, surgeries, and ER visits in previous 12 months Vitals Screenings to include cognitive, depression, and falls Referrals and  appointments  In addition, I have reviewed and discussed with patient certain preventive protocols, quality metrics, and best practice recommendations. A written personalized care plan for preventive services as well as general preventive health recommendations were provided to patient.     Alexanderjames Berg, CMA   05/13/2024   After Visit Summary: (MyChart) Due to this being a telephonic visit, the after visit summary with patients personalized plan was offered to patient via MyChart   Notes: Please refer to Routing Comments.

## 2024-05-14 ENCOUNTER — Other Ambulatory Visit: Payer: Self-pay | Admitting: Internal Medicine

## 2024-05-15 NOTE — Telephone Encounter (Signed)
 Prescription refill request for Xarelto  received.  Indication: PAF Last office visit: 01/14/24  Mathews Solomons MD Weight: 136.9kg Age: 68 Scr: 2.03 on 03/24/24  Epic CrCl: 58.91  Based on above findings Xarelto  20mg  daily is the appropriate dose.  Refill approved.

## 2024-05-21 ENCOUNTER — Ambulatory Visit: Payer: HMO

## 2024-05-23 ENCOUNTER — Other Ambulatory Visit: Payer: Self-pay | Admitting: Family Medicine

## 2024-05-23 DIAGNOSIS — J302 Other seasonal allergic rhinitis: Secondary | ICD-10-CM

## 2024-05-26 ENCOUNTER — Ambulatory Visit
Admission: RE | Admit: 2024-05-26 | Discharge: 2024-05-26 | Disposition: A | Discharge status: Home / Self Care | Source: Ambulatory Visit | Attending: Family Medicine | Admitting: Family Medicine

## 2024-05-26 DIAGNOSIS — Z1231 Encounter for screening mammogram for malignant neoplasm of breast: Secondary | ICD-10-CM

## 2024-06-12 ENCOUNTER — Ambulatory Visit

## 2024-06-19 ENCOUNTER — Ambulatory Visit

## 2024-06-24 ENCOUNTER — Ambulatory Visit

## 2024-06-24 DIAGNOSIS — E1165 Type 2 diabetes mellitus with hyperglycemia: Secondary | ICD-10-CM | POA: Diagnosis not present

## 2024-06-24 LAB — HM DIABETES EYE EXAM

## 2024-06-24 NOTE — Progress Notes (Signed)
 Isabella Hall arrived 06/24/2024 and has given verbal consent to obtain images and complete their overdue diabetic retinal screening.  The images have been sent to an ophthalmologist or optometrist for review and interpretation.  Results will be sent back to Zarwolo, Gloria, FNP for review.  Patient has been informed they will be contacted when we receive the results via telephone or MyChart

## 2024-06-30 ENCOUNTER — Other Ambulatory Visit: Payer: Self-pay

## 2024-06-30 DIAGNOSIS — J302 Other seasonal allergic rhinitis: Secondary | ICD-10-CM

## 2024-06-30 MED ORDER — LEVOCETIRIZINE DIHYDROCHLORIDE 5 MG PO TABS: 5.0000 mg | ORAL_TABLET | Freq: Every evening | ORAL | 0 refills | Status: DC

## 2024-07-04 ENCOUNTER — Other Ambulatory Visit: Payer: Self-pay | Admitting: Internal Medicine

## 2024-07-17 ENCOUNTER — Ambulatory Visit (HOSPITAL_COMMUNITY)
Admission: RE | Admit: 2024-07-17 | Discharge: 2024-07-17 | Disposition: A | Discharge status: Home / Self Care | Source: Ambulatory Visit | Attending: Physician Assistant | Admitting: Physician Assistant

## 2024-07-17 ENCOUNTER — Encounter (HOSPITAL_COMMUNITY): Payer: Self-pay | Admitting: Physician Assistant

## 2024-07-17 VITALS — BP 112/80 | HR 135 | Ht 66.0 in | Wt 261.2 lb

## 2024-07-17 DIAGNOSIS — I480 Paroxysmal atrial fibrillation: Secondary | ICD-10-CM

## 2024-07-17 DIAGNOSIS — I4819 Other persistent atrial fibrillation: Secondary | ICD-10-CM

## 2024-07-17 DIAGNOSIS — D6869 Other thrombophilia: Secondary | ICD-10-CM | POA: Diagnosis not present

## 2024-07-17 NOTE — Progress Notes (Signed)
 Primary Care Physician: Zarwolo, Gloria, FNP Referring Physician: Dr. Kelsie Service Isabella Hall is a 68 y.o. female with a h/o  Afib, s/p ablation in 2019, CHF, HTN, Mod MR, NICM, DMT2 that is in the afib clinic for f/u recent cardioversion. She  saw Dr. Kelsie   in the [facility]  clinic 11/5 for typical atrial flutter and she had a successful cardioversion. Ekg now shows atypical atrial flutter vrs coarse afib. She felt improved after CV for around 3 days and then started feeling tired  again. Her v rate is 120 bpm today. Her cardizem  was reduced from her usual dose of 240 mg bid to 240 mg daily at time of CV. She had lost down to 260 lb, thru diet and walking several days a week, but got lazy,  stopped walking, diet slipped  and her weight has now  increased to over 300 lbs again.   F/u in afib clinic for tikosyn  admit, 11/30/20. No missed xarelto  doses x at least 3 weeks, no benadryl use. Plans to get drug thru [facility]. Qtc today at 463 ms, atrial flutter with variable block at 103 bpm. In rhythm, I have seen qt at 440 ms .  F/u tikosyn  admission,12/08/20.  Unfortunately,  her qr prolonged so tikosyn  was stopped. She also had a nosebleed in the hospital that was difficult to control but finally resolved with no further issues. She  is in SR today but d/c instructions say to start amio 200 mg bid today. Her qtc is 418 ms today. She feels better and has gone back to walking and has lost 7 lbs.   Pt is here for f/u, 12/14/20, after being on amiodarone  200 mg bid, started last week. She failed Tikosyn  admit for prolonged qt. EKG shows sinus brady at 59 bpm, qt stable at 451 ms and has lost several more pounds as she now feels like walking for exercise. She is tolerating amio well.   F/u 09/05/22. She had been doing well until she developed bronchitis 2 weeks ago. At that time, she went into afib and she increased her amiodarone  from  100 mg daily to 200 mg bid. She has RVR today. She feels her bronchitis  is improving. We discussed pursing CV in another week to allow her to fully recover from bronchitis and she is in agreement. She states she covid tested and it was negative. She would prefer it to be at [facility]. She has gained 18 lbs since last visssit here but pt does not feel it is edema. No pedal edema seen.   F/u in afib clinic, 10/03/22. She had a successful cardioversion and remains in SR. She has recovered from her bronchitis and feels much improved.   Follow up 07/17/24. Patient returns for follow up for atrial fibrillation. She reports that on Monday she started to notice increased fatigue and a rapid heart rate. ECG today confirms she is in afib. There were no specific triggers that she could identify. No bleeding issues on anticoagulation. She has lost ~100 lbs with lifestyle changes and Mounjaro .   Today, she  denies symptoms of chest pain, shortness of breath, orthopnea, PND, lower extremity edema, dizziness, presyncope, syncope, snoring, daytime somnolence, bleeding, or neurologic sequela. The patient is tolerating medications without difficulties and is otherwise without complaint today.    Past Medical History:  Diagnosis Date   Atrial fibrillation (HCC)     CHADSVASC score 4   CKD (chronic kidney disease)  Essential hypertension    Hypothyroidism    Obesity    PONV (postoperative nausea and vomiting)    Type 2 diabetes, diet controlled (HCC)    Typical atrial flutter (HCC) 09/2020     Current Outpatient Medications  Medication Sig Dispense Refill   carvedilol  (COREG ) 25 MG tablet Take 1 tablet (25 mg total) by mouth 2 (two) times daily with a meal. 180 tablet 3   cholecalciferol  (VITAMIN D3) 25 MCG (1000 UNIT) tablet Take 1,000 Units by mouth daily.     diltiazem  (CARDIZEM  SR) 120 MG 12 hr capsule Take 1 capsule by mouth twice daily 180 capsule 1   ezetimibe  (ZETIA ) 10 MG tablet Take 1 tablet (10 mg total) by mouth daily. 90 tablet 3   GARLIC PO Take 1 tablet by mouth  daily.     levocetirizine (XYZAL ) 5 MG tablet Take 1 tablet (5 mg total) by mouth every evening. 30 tablet 0   levothyroxine  (SYNTHROID ) 125 MCG tablet Take 1 tablet (125 mcg total) by mouth daily. 90 tablet 3   losartan  (COZAAR ) 50 MG tablet Take 1 tablet by mouth once daily 90 tablet 3   Multiple Vitamin (MULTIVITAMIN WITH MINERALS) TABS tablet Take 1 tablet by mouth daily.      Multiple Vitamins-Minerals (EMERGEN-C IMMUNE PO) Take 1 tablet by mouth daily.      rivaroxaban  (XARELTO ) 20 MG TABS tablet TAKE 1 TABLET BY MOUTH ONCE DAILY WITH SUPPER 30 tablet 5   tirzepatide  (MOUNJARO ) 15 MG/0.5ML Pen Inject 15 mg into the skin once a week. 2 mL 11   No current facility-administered medications for this encounter.    ROS- All systems are reviewed and negative except as per the HPI above  Physical Exam: Vitals:   07/17/24 1050  BP: 112/80  Pulse: (!) 135  Weight: 118.5 kg  Height: 5' 6 (1.676 m)   Wt Readings from Last 3 Encounters:  07/17/24 118.5 kg  05/13/24 122.5 kg  04/14/24 127 kg    GEN: Well nourished, well developed in no acute distress NECK: No JVD CARDIAC: Irregularly irregular rate and rhythm, no murmurs, rubs, gallops RESPIRATORY:  Clear to auscultation without rales, wheezing or rhonchi  ABDOMEN: Soft, non-tender, non-distended EXTREMITIES:  No edema; No deformity    EKG today demonstrates Afib with RVR Vent. rate 135 BPM PR interval * ms QRS duration 86 ms QT/QTcB 324/486 ms   Echo 04/30/23  1. Left ventricular ejection fraction, by estimation, is 60 to 65%. The  left ventricle has normal function. The left ventricle has no regional  wall motion abnormalities. Left ventricular diastolic parameters were  normal.   2. Right ventricular systolic function was not well visualized. The right  ventricular size is normal. Tricuspid regurgitation signal is inadequate  for assessing PA pressure.   3. Left atrial size was mild to moderately dilated.   4. The  mitral valve is grossly normal. No evidence of mitral valve  regurgitation. No evidence of mitral stenosis.   5. The aortic valve is tricuspid. Aortic valve regurgitation is not  visualized. No aortic stenosis is present.   6. The inferior vena cava is normal in size with greater than 50%  respiratory variability, suggesting right atrial pressure of 3 mmHg.   Comparison(s): Prior images reviewed side by side. Changes from prior  study are noted. LVEF improved from 45-50% in 2021 to 60-65% now. Had mild to moderate MR in 2021 and no MR now.     CHA2DS2-VASc Score =  4  The patient's score is based upon: CHF History: 0 HTN History: 1 Diabetes History: 1 Stroke History: 0 Vascular Disease History: 0 Age Score: 1 Gender Score: 1       ASSESSMENT AND PLAN: Persistent Atrial Fibrillation/atrial flutter (ICD10:  I48.19) The patient's CHA2DS2-VASc score is 4, indicating a 4.8% annual risk of stroke.   Previously failed dofetilide  due to prolonged QT and amiodarone  due to thyroid  dysfunction.  She is back in persistent afib with elevated rates.  We discussed rhythm control options. Will plan for DCCV.  Check bmet/cbc Continue Xarelto  20 mg daily, she denies any missed doses in the past 3 weeks.  Continue diltiazem  120 mg BID Continue carvedilol  25 mg BID If she has quick return of afib, question if she would be an ablation candidate with her significant weight loss.   Secondary Hypercoagulable State (ICD10:  D68.69) The patient is at significant risk for stroke/thromboembolism based upon her CHA2DS2-VASc Score of 4.  Continue Rivaroxaban  (Xarelto ). No bleeding issues.   Obesity Body mass index is 42.16 kg/m.  Encouraged lifestyle modification On mounjaro  Has lost ~100 lbs, congratulated.   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    Informed Consent    Shared Decision Making/Informed Consent The risks (stroke, cardiac arrhythmias rarely resulting in the need for a temporary or permanent pacemaker, skin irritation or burns and complications associated with conscious sedation including aspiration, arrhythmia, respiratory failure and death), benefits (restoration of normal sinus rhythm) and alternatives of a direct current cardioversion were explained in detail to Ms. Doring and she agrees to proceed.        Daril Kicks PA-C [facility] [address] [place], [place] [address] [phone]

## 2024-07-17 NOTE — Patient Instructions (Signed)
 Cardioversion scheduled for: Monday 07/21/24 11:00am   - Arrive at the [facility] ([address])  and check in with ADMITTING at 11:00am   - Do not eat or drink anything after midnight the night prior to your procedure.   - Take all your morning medication (except diabetic medications) with a sip of water prior to arrival.  - Do NOT miss any doses of your blood thinner - if you should miss a dose or take a dose more than 4 hours late -- please notify our office immediately.  - You will not be able to drive home after your procedure. Please ensure you have a responsible adult to drive you home. You will need someone with you for 24 hours post procedure.     - Expect to be in the procedural area approximately 2 hours.   - If you feel as if you go back into normal rhythm prior to scheduled cardioversion, please notify our office immediately.   If your procedure is canceled in the cardioversion suite you will be charged a cancellation fee.    Hold below medications 7 days prior to scheduled procedure/anesthesia.  Restart medication on the normal dosing day after scheduled procedure/anesthesia   Tirzepatide  (Mounjaro ) (Zebound)       For those patients who have a scheduled procedure/anesthesia on the same day of the week as their dose, hold the medication on the day of surgery.  They can take their scheduled dose the week before.  **Patients on the above medications scheduled for elective procedures that have not held the medication for the appropriate amount of time are at risk of cancellation or change in the anesthetic plan.

## 2024-07-18 ENCOUNTER — Ambulatory Visit (HOSPITAL_COMMUNITY): Payer: Self-pay | Admitting: Physician Assistant

## 2024-07-18 LAB — BASIC METABOLIC PANEL WITH GFR
BUN/Creatinine Ratio: 13 (ref 12–28)
BUN: 43 mg/dL — ABNORMAL HIGH (ref 8–27)
CO2: 19 mmol/L — ABNORMAL LOW (ref 20–29)
Calcium: 9.3 mg/dL (ref 8.7–10.3)
Chloride: 101 mmol/L (ref 96–106)
Creatinine, Ser: 3.25 mg/dL — ABNORMAL HIGH (ref 0.57–1.00)
Glucose: 154 mg/dL — ABNORMAL HIGH (ref 70–99)
Potassium: 4.4 mmol/L (ref 3.5–5.2)
Sodium: 138 mmol/L (ref 134–144)
eGFR: 15 mL/min/1.73 — ABNORMAL LOW (ref >59)

## 2024-07-18 LAB — CBC
Hematocrit: 36.3 % (ref 34.0–46.6)
Hemoglobin: 11.5 g/dL (ref 11.1–15.9)
MCH: 28.7 pg (ref 26.6–33.0)
MCHC: 31.7 g/dL (ref 31.5–35.7)
MCV: 91 fL (ref 79–97)
Platelets: 197 x10E3/uL (ref 150–450)
RBC: 4.01 x10E6/uL (ref 3.77–5.28)
RDW: 12.9 % (ref 11.7–15.4)
WBC: 8.2 x10E3/uL (ref 3.4–10.8)

## 2024-07-18 NOTE — Progress Notes (Signed)
 Spoke to patient and instructed them to come at 0945  and to be NPO after 0000.     Confirmed that patient will have a ride home and someone to stay with them for 24 hours after the procedure.   Medications reviewed.  Confirmed blood thinner.  Confirmed no breaks in taking blood thinner for 3+ weeks prior to procedure. Confirmed patient stopped all GLP-1s and GLP-2s for at least one week before procedure.

## 2024-07-21 ENCOUNTER — Ambulatory Visit (HOSPITAL_COMMUNITY)
Admission: RE | Admit: 2024-07-21 | Discharge: 2024-07-21 | Disposition: A | Discharge status: Home / Self Care | Attending: Cardiology | Admitting: Cardiology

## 2024-07-21 DIAGNOSIS — I480 Paroxysmal atrial fibrillation: Secondary | ICD-10-CM

## 2024-07-21 DIAGNOSIS — Z539 Procedure and treatment not carried out, unspecified reason: Secondary | ICD-10-CM | POA: Insufficient documentation

## 2024-07-21 DIAGNOSIS — I4891 Unspecified atrial fibrillation: Secondary | ICD-10-CM | POA: Diagnosis not present

## 2024-07-21 DIAGNOSIS — I4819 Other persistent atrial fibrillation: Secondary | ICD-10-CM

## 2024-07-21 SURGERY — CARDIOVERSION (CATH LAB)
Anesthesia: General

## 2024-07-21 NOTE — Progress Notes (Signed)
 Patient presented in sinus rhythm, confirmed with ECG. Discussed with patient, cardioversion cancelled.  Shelda Bruckner, MD, PhD, [facility]  [facility]  [place]  [facility] at [facility] at [facility] [address], [address] [place], [place] [address] [phone]

## 2024-07-24 ENCOUNTER — Ambulatory Visit: Payer: Self-pay | Admitting: Family Medicine

## 2024-07-31 ENCOUNTER — Other Ambulatory Visit: Payer: Self-pay | Admitting: Family Medicine

## 2024-07-31 DIAGNOSIS — J302 Other seasonal allergic rhinitis: Secondary | ICD-10-CM

## 2024-08-07 ENCOUNTER — Other Ambulatory Visit (HOSPITAL_COMMUNITY): Payer: Self-pay

## 2024-08-07 ENCOUNTER — Telehealth: Payer: Self-pay | Admitting: Pharmacy Technician

## 2024-08-07 NOTE — Telephone Encounter (Signed)
 Pharmacy Patient Advocate Encounter  Received notification from [employer] that Prior Authorization for Mounjaro  has been APPROVED from 08/07/24 to 08/07/25. Ran test claim, Copay is $0.00- one month. This test claim was processed through [facility]- copay amounts may vary at other pharmacies due to pharmacy/plan contracts, or as the patient moves through the different stages of their insurance plan.   PA #/Case ID/Reference #: [id number]

## 2024-08-07 NOTE — Telephone Encounter (Signed)
 Pharmacy Patient Advocate Encounter   Received notification from CoverMyMeds that prior authorization for mounjaro  15 is required/requested.   Insurance verification completed.   The patient is insured through [place] [employer] .   Per test claim: PA required; PA submitted to above mentioned insurance via Latent Key/confirmation #/EOC [id number] Status is pending

## 2024-08-12 ENCOUNTER — Ambulatory Visit (HOSPITAL_COMMUNITY): Admitting: Physician Assistant

## 2024-08-22 ENCOUNTER — Ambulatory Visit (HOSPITAL_COMMUNITY): Admitting: Physician Assistant

## 2024-09-02 ENCOUNTER — Other Ambulatory Visit: Payer: Self-pay | Admitting: Family Medicine

## 2024-09-02 DIAGNOSIS — J302 Other seasonal allergic rhinitis: Secondary | ICD-10-CM

## 2024-09-10 DIAGNOSIS — M9903 Segmental and somatic dysfunction of lumbar region: Secondary | ICD-10-CM | POA: Diagnosis not present

## 2024-09-10 DIAGNOSIS — M5442 Lumbago with sciatica, left side: Secondary | ICD-10-CM | POA: Diagnosis not present

## 2024-09-10 DIAGNOSIS — M9902 Segmental and somatic dysfunction of thoracic region: Secondary | ICD-10-CM | POA: Diagnosis not present

## 2024-09-10 DIAGNOSIS — M9905 Segmental and somatic dysfunction of pelvic region: Secondary | ICD-10-CM | POA: Diagnosis not present

## 2024-09-12 ENCOUNTER — Other Ambulatory Visit: Payer: Self-pay | Admitting: Family Medicine

## 2024-09-12 DIAGNOSIS — J302 Other seasonal allergic rhinitis: Secondary | ICD-10-CM

## 2024-09-24 DIAGNOSIS — M9902 Segmental and somatic dysfunction of thoracic region: Secondary | ICD-10-CM | POA: Diagnosis not present

## 2024-09-24 DIAGNOSIS — M9903 Segmental and somatic dysfunction of lumbar region: Secondary | ICD-10-CM | POA: Diagnosis not present

## 2024-09-24 DIAGNOSIS — M9901 Segmental and somatic dysfunction of cervical region: Secondary | ICD-10-CM | POA: Diagnosis not present

## 2024-09-24 DIAGNOSIS — M5442 Lumbago with sciatica, left side: Secondary | ICD-10-CM | POA: Diagnosis not present

## 2024-09-24 DIAGNOSIS — M542 Cervicalgia: Secondary | ICD-10-CM | POA: Diagnosis not present

## 2024-09-24 DIAGNOSIS — M546 Pain in thoracic spine: Secondary | ICD-10-CM | POA: Diagnosis not present

## 2024-09-24 DIAGNOSIS — M9905 Segmental and somatic dysfunction of pelvic region: Secondary | ICD-10-CM | POA: Diagnosis not present

## 2024-10-09 ENCOUNTER — Ambulatory Visit (INDEPENDENT_AMBULATORY_CARE_PROVIDER_SITE_OTHER): Payer: Self-pay | Admitting: Family Medicine

## 2024-10-09 ENCOUNTER — Encounter: Payer: Self-pay | Admitting: Family Medicine

## 2024-10-09 VITALS — BP 156/88 | HR 73 | Ht 65.0 in | Wt 250.0 lb

## 2024-10-09 DIAGNOSIS — E1165 Type 2 diabetes mellitus with hyperglycemia: Secondary | ICD-10-CM

## 2024-10-09 DIAGNOSIS — E782 Mixed hyperlipidemia: Secondary | ICD-10-CM | POA: Diagnosis not present

## 2024-10-09 DIAGNOSIS — E559 Vitamin D deficiency, unspecified: Secondary | ICD-10-CM | POA: Diagnosis not present

## 2024-10-09 DIAGNOSIS — Z7985 Long-term (current) use of injectable non-insulin antidiabetic drugs: Secondary | ICD-10-CM | POA: Diagnosis not present

## 2024-10-09 DIAGNOSIS — E612 Magnesium deficiency: Secondary | ICD-10-CM

## 2024-10-09 DIAGNOSIS — Z23 Encounter for immunization: Secondary | ICD-10-CM | POA: Diagnosis not present

## 2024-10-09 DIAGNOSIS — E038 Other specified hypothyroidism: Secondary | ICD-10-CM

## 2024-10-09 NOTE — Assessment & Plan Note (Signed)
 Patient educated on CDC recommendation for the vaccine. Verbal consent was obtained from the patient, vaccine administered by nurse, no sign of adverse reactions noted at this time. Patient education on arm soreness and use of tylenol or ibuprofen for this patient  was discussed. Patient educated on the signs and symptoms of adverse effect and advise to contact the office if they occur.

## 2024-10-09 NOTE — Progress Notes (Signed)
 Established Patient Office Visit  Subjective:  Patient ID: Isabella Hall, female    DOB: 02/10/1956  Age: 68 y.o. MRN: [id number]  CC:  Chief Complaint  Patient presents with   Medical Management of Chronic Issues    Four month follow up    HPI Isabella Hall is a 68 y.o. female with past medical history of if it, type II diabetes, hypothyroidism presents for f/u of  chronic medical conditions.Note: This chart has been completed using Engineer, civil (consulting) software, and while attempts have been made to ensure accuracy, certain words and phrases may not be transcribed as intended.      Past Medical History:  Diagnosis Date   Atrial fibrillation (HCC)     CHADSVASC score 4   CKD (chronic kidney disease)    Essential hypertension    Hypothyroidism    Obesity    PONV (postoperative nausea and vomiting)    Type 2 diabetes, diet controlled (HCC)    Typical atrial flutter (HCC) 09/2020    Past Surgical History:  Procedure Laterality Date   APPENDECTOMY  1979   ATRIAL FIBRILLATION ABLATION  03/26/2018   ATRIAL FIBRILLATION ABLATION N/A 03/26/2018   Procedure: ATRIAL FIBRILLATION ABLATION;  Surgeon: Kelsie Agent, MD;  [facility]: [facility] INVASIVE CV LAB;  Service: Cardiovascular;  Laterality: N/A;   CARDIOVERSION N/A 01/25/2015   Procedure: CARDIOVERSION;  Surgeon: Jayson KANDICE Sierras, MD;  Location: [facility] ORS;  Service: Cardiovascular;  Laterality: N/A;   CARDIOVERSION N/A 10/28/2020   Procedure: CARDIOVERSION;  Surgeon: Sierras Jayson KANDICE, MD;  Location: [facility] ENDO SUITE;  Service: Cardiovascular;  Laterality: N/A;   CARDIOVERSION N/A 06/10/2021   Procedure: CARDIOVERSION;  Surgeon: Raford Riggs, MD;  Location: [facility] ENDOSCOPY;  Service: Cardiovascular;  Laterality: N/A;   CARDIOVERSION N/A 09/15/2022   Procedure: CARDIOVERSION;  Surgeon: Santo Stanly LABOR, MD;  Location: [facility] ORS;  Service: Cardiovascular;  Laterality: N/A;   CHOLECYSTECTOMY OPEN  1979   COLONOSCOPY WITH PROPOFOL  N/A  06/16/2022   Procedure: COLONOSCOPY WITH PROPOFOL ;  Surgeon: Cindie Carlin POUR, DO;  Location: [facility] ENDO [facility];  Service: Endoscopy;  Laterality: N/A;  3:00pm   LEFT HEART CATHETERIZATION WITH CORONARY ANGIOGRAM N/A 01/27/2015   Procedure: LEFT HEART CATHETERIZATION WITH CORONARY ANGIOGRAM;  Surgeon: Victory LELON Claudene DOUGLAS, MD;  Location: [facility] CATH LAB;  Service: Cardiovascular;  Laterality: N/A;   TEE WITHOUT CARDIOVERSION N/A 01/25/2015   Procedure: TRANSESOPHAGEAL ECHOCARDIOGRAM (TEE);  Surgeon: Jayson KANDICE Sierras, MD;  Location: [facility] ORS;  Service: Cardiovascular;  Laterality: N/A;   TUBAL LIGATION  1980    Family History  Problem Relation Age of Onset   Cancer Mother    Colon cancer Mother        in 52s   Diabetes type II Mother    Heart disease Father    Coronary artery disease Sister        died of an MI in her 58's   Heart disease Sister        dies at age 43   Diabetes type II Sister    Kidney disease Sister    Cancer Maternal Grandmother        ?leukemia   Hypertension Other    Leukemia Nephew        30    Social History   Socioeconomic History   Marital status: Divorced    Spouse name: Not on file   Number of children: 2   Years of education: Not on file   Highest education  level: Associate degree: academic program  Occupational History   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 .   Social Drivers of Corporate investment banker Strain: Low Risk  (05/13/2024)   Overall Financial  Resource Strain (CARDIA)    Difficulty of Paying Living Expenses: Not hard at all  Food Insecurity: No Food Insecurity (05/13/2024)   Hunger Vital Sign    Worried About Running Out of Food in the Last Year: Never true    Ran Out of Food in the Last Year: Never true  Transportation Needs: No Transportation Needs (05/13/2024)   PRAPARE - Administrator, Civil Service (Medical): No    Lack of Transportation (Non-Medical): No  Physical Activity: Sufficiently Active (05/13/2024)   Exercise Vital Sign    Days of Exercise per Week: 7 days    Minutes of Exercise per Session: 30 min  Stress: No Stress Concern Present (05/13/2024)   [employer] of Occupational Health - Occupational Stress Questionnaire    Feeling of Stress : Not at all  Social Connections: Moderately Integrated (05/13/2024)   Social Connection and Isolation Panel    Frequency of Communication with Friends and Family: More than three times a week    Frequency of Social Gatherings with Friends and Family: More than three times a week    Attends Religious Services: More than 4 times per year    Active Member of [employer] or Organizations: Yes    Attends Engineer, structural: More than 4 times per year    Marital Status: Divorced  Intimate Partner Violence: Not At Risk (05/13/2024)   Humiliation, Afraid, Rape, and Kick questionnaire    Fear of Current or Ex-Partner: No    Emotionally Abused: No    Physically Abused: No    Sexually Abused: No    Outpatient Medications Prior to Visit  Medication Sig Dispense Refill   carvedilol  (COREG ) 25 MG tablet Take 1 tablet (25 mg total) by mouth 2 (two) times daily with a meal. 180 tablet 3   cholecalciferol  (VITAMIN D3) 25 MCG (1000 UNIT) tablet Take 1,000 Units by mouth in the morning.     diltiazem  (CARDIZEM  SR) 120 MG 12 hr capsule Take 1 capsule by mouth twice daily 180 capsule 1   GARLIC PO Take 1 tablet by mouth in the morning.     levocetirizine (XYZAL ) 5 MG  tablet TAKE 1 TABLET BY MOUTH ONCE DAILY IN THE EVENING 30 tablet 0   levothyroxine  (SYNTHROID ) 125 MCG tablet Take 1 tablet (125 mcg total) by mouth daily. 90 tablet 3   losartan  (COZAAR ) 50 MG tablet Take 1 tablet by mouth once daily 90 tablet 3   Multiple Vitamin (MULTIVITAMIN WITH MINERALS) TABS tablet Take 1 tablet by mouth in the morning.     Multiple Vitamins-Minerals (EMERGEN-C IMMUNE PO) Take 1 packet by mouth in the morning.     rivaroxaban  (XARELTO ) 20 MG TABS tablet  TAKE 1 TABLET BY MOUTH ONCE DAILY WITH SUPPER 30 tablet 5   tirzepatide  (MOUNJARO ) 15 MG/0.5ML Pen Inject 15 mg into the skin once a week. (Patient taking differently: Inject 15 mg into the skin every Friday.) 2 mL 11   No facility-administered medications prior to visit.    No Known Allergies  ROS Review of Systems  Constitutional:  Negative for chills and fever.  Eyes:  Negative for visual disturbance.  Respiratory:  Negative for chest tightness and shortness of breath.   Neurological:  Negative for dizziness and headaches.      Objective:    Physical Exam HENT:     Head: Normocephalic.     Mouth/Throat:     Mouth: Mucous membranes are moist.  Cardiovascular:     Rate and Rhythm: Normal rate.     Heart sounds: Normal heart sounds.  Pulmonary:     Effort: Pulmonary effort is normal.     Breath sounds: Normal breath sounds.  Neurological:     Mental Status: She is alert.     BP (!) 156/88   Pulse 73   Ht 5' 5 (1.651 m)   Wt 250 lb (113.4 kg)   SpO2 95%   BMI 41.60 kg/m  Wt Readings from Last 3 Encounters:  10/09/24 250 lb (113.4 kg)  07/17/24 261 lb 3.2 oz (118.5 kg)  05/13/24 270 lb (122.5 kg)    Lab Results  Component Value Date   TSH 0.276 (L) 03/24/2024   Lab Results  Component Value Date   WBC 8.2 07/17/2024   HGB 11.5 07/17/2024   HCT 36.3 07/17/2024   MCV 91 07/17/2024   PLT 197 07/17/2024   Lab Results  Component Value Date   NA 138 07/17/2024   K 4.4 07/17/2024    CO2 19 (L) 07/17/2024   GLUCOSE 154 (H) 07/17/2024   BUN 43 (H) 07/17/2024   CREATININE 3.25 (H) 07/17/2024   BILITOT 0.5 03/24/2024   ALKPHOS 60 03/24/2024   AST 21 03/24/2024   ALT 15 03/24/2024   PROT 6.9 03/24/2024   ALBUMIN 4.0 03/24/2024   CALCIUM  9.3 07/17/2024   ANIONGAP 11 06/07/2023   EGFR 15 (L) 07/17/2024   GFR 37.19 (L) 03/03/2015   Lab Results  Component Value Date   CHOL 210 (H) 03/24/2024   Lab Results  Component Value Date   HDL 55 03/24/2024   Lab Results  Component Value Date   LDLCALC 142 (H) 03/24/2024   Lab Results  Component Value Date   TRIG 70 03/24/2024   Lab Results  Component Value Date   CHOLHDL 3.8 03/24/2024   Lab Results  Component Value Date   HGBA1C 5.4 03/24/2024      Assessment & Plan:  Type 2 diabetes mellitus with hyperglycemia, without long-term current use of insulin  (HCC) Assessment & Plan: The patient is currently on Mounjaro  15 mg weekly and denies symptoms of polyuria, polyphagia, or polydipsia.  Encouraged to decrease her intake of high-sugar foods and beverages and to increase physical activity for optimal results.  Lab Results  Component Value Date   HGB 11.5 07/17/2024     Orders: -     Microalbumin / creatinine urine ratio -     Hemoglobin A1c  Other specified hypothyroidism Assessment & Plan: The patient currently takes Synthroid  125 mcg daily.  Encouraged to continue treatment regimen pending labs   Encounter for immunization Assessment & Plan: Patient educated on CDC recommendation for the vaccine. Verbal consent was  obtained from the patient, vaccine administered by nurse, no sign of adverse reactions noted at this time. Patient education on arm soreness and use of tylenol  or ibuprofen for this patient  was discussed. Patient educated on the signs and symptoms of adverse effect and advise to contact the office if they occur.   Orders: -     Flu vaccine HIGH DOSE PF(Fluzone Trivalent)  Vitamin D   deficiency -     VITAMIN D  25 Hydroxy (Vit-D Deficiency, Fractures)  TSH (thyroid -stimulating hormone deficiency) -     TSH + free T4  Mixed hyperlipidemia -     Lipid panel -     CMP14+EGFR -     CBC with Differential/Platelet  Magnesium  deficiency -     Magnesium   Note: This chart has been completed using Engineer, civil (consulting) software, and while attempts have been made to ensure accuracy, certain words and phrases may not be transcribed as intended.    Follow-up: Return in about 4 months (around 02/09/2025).   Shannelle Alguire  Z Bacchus, FNP

## 2024-10-09 NOTE — Patient Instructions (Addendum)
 I appreciate the opportunity to provide care to you today!    Follow up:  4 months  Fasting Labs: please stop by the lab during the week to get your blood drawn (CBC, CMP, TSH, Lipid profile, HgA1c, Vit D, mag )  Schedule medicare annual wellness  For a Healthier YOU, I Recommend: Reducing your intake of sugar, sodium, carbohydrates, and saturated fats. Increasing your fiber intake by incorporating more whole grains, fruits, and vegetables into your meals. Setting healthy goals with a focus on lowering your consumption of carbs, sugar, and unhealthy fats. Adding variety to your diet by including a wide range of fruits and vegetables. Cutting back on soda and limiting processed foods as much as possible. Staying active: In addition to taking your weight loss medication, aim for at least 150 minutes of moderate-intensity physical activity each week for optimal results.    Please follow up if your symptoms worsen or fail to improve.   Please continue to a heart-healthy diet and increase your physical activities. Try to exercise for at least five days a week.    It was a pleasure to see you and I look forward to continuing to work together on your health and well-being. Please do not hesitate to call the office if you need care or have questions about your care.  In case of emergency, please visit the Emergency Department for urgent care, or contact our clinic at [phone] to schedule an appointment. We're here to help you!   Have a wonderful day and week. With Gratitude, Meade JENEANE Gerlach MSN, FNP-BC, PMHNP-BC

## 2024-10-09 NOTE — Assessment & Plan Note (Signed)
 The patient is currently on Mounjaro  15 mg weekly and denies symptoms of polyuria, polyphagia, or polydipsia.  Encouraged to decrease her intake of high-sugar foods and beverages and to increase physical activity for optimal results.  Lab Results  Component Value Date   HGB 11.5 07/17/2024

## 2024-10-09 NOTE — Assessment & Plan Note (Signed)
 The patient currently takes Synthroid  125 mcg daily.  Encouraged to continue treatment regimen pending labs

## 2024-10-10 LAB — VITAMIN D 25 HYDROXY (VIT D DEFICIENCY, FRACTURES): Vit D, 25-Hydroxy: 84.1 ng/mL (ref 30.0–100.0)

## 2024-10-10 LAB — CMP14+EGFR
ALT: 13 IU/L (ref 0–32)
AST: 19 IU/L (ref 0–40)
Albumin: 3.9 g/dL (ref 3.9–4.9)
Alkaline Phosphatase: 56 IU/L (ref 49–135)
BUN/Creatinine Ratio: 17 (ref 12–28)
BUN: 29 mg/dL — ABNORMAL HIGH (ref 8–27)
Bilirubin Total: 0.4 mg/dL (ref 0.0–1.2)
CO2: 24 mmol/L (ref 20–29)
Calcium: 9.3 mg/dL (ref 8.7–10.3)
Chloride: 103 mmol/L (ref 96–106)
Creatinine, Ser: 1.75 mg/dL — ABNORMAL HIGH (ref 0.57–1.00)
Globulin, Total: 2.7 g/dL (ref 1.5–4.5)
Glucose: 86 mg/dL (ref 70–99)
Potassium: 4.1 mmol/L (ref 3.5–5.2)
Sodium: 139 mmol/L (ref 134–144)
Total Protein: 6.6 g/dL (ref 6.0–8.5)
eGFR: 32 mL/min/1.73 — ABNORMAL LOW (ref >59)

## 2024-10-10 LAB — CBC WITH DIFFERENTIAL/PLATELET
Basophils Absolute: 0 x10E3/uL (ref 0.0–0.2)
Basos: 1 %
EOS (ABSOLUTE): 0.3 x10E3/uL (ref 0.0–0.4)
Eos: 5 %
Hematocrit: 36.1 % (ref 34.0–46.6)
Hemoglobin: 11.5 g/dL (ref 11.1–15.9)
Immature Grans (Abs): 0 x10E3/uL (ref 0.0–0.1)
Immature Granulocytes: 0 %
Lymphocytes Absolute: 1.8 x10E3/uL (ref 0.7–3.1)
Lymphs: 33 %
MCH: 28.1 pg (ref 26.6–33.0)
MCHC: 31.9 g/dL (ref 31.5–35.7)
MCV: 88 fL (ref 79–97)
Monocytes Absolute: 0.5 x10E3/uL (ref 0.1–0.9)
Monocytes: 8 %
Neutrophils Absolute: 2.9 x10E3/uL (ref 1.4–7.0)
Neutrophils: 53 %
Platelets: 173 x10E3/uL (ref 150–450)
RBC: 4.09 x10E6/uL (ref 3.77–5.28)
RDW: 13.8 % (ref 11.7–15.4)
WBC: 5.5 x10E3/uL (ref 3.4–10.8)

## 2024-10-10 LAB — LIPID PANEL
Chol/HDL Ratio: 3.8 ratio (ref 0.0–4.4)
Cholesterol, Total: 196 mg/dL (ref 100–199)
HDL: 51 mg/dL (ref >39)
LDL Chol Calc (NIH): 133 mg/dL — ABNORMAL HIGH (ref 0–99)
Triglycerides: 68 mg/dL (ref 0–149)
VLDL Cholesterol Cal: 12 mg/dL (ref 5–40)

## 2024-10-10 LAB — HEMOGLOBIN A1C
Est. average glucose Bld gHb Est-mCnc: 100 mg/dL
Hgb A1c MFr Bld: 5.1 % (ref 4.8–5.6)

## 2024-10-10 LAB — TSH+FREE T4
Free T4: 1.59 ng/dL (ref 0.82–1.77)
TSH: 2.3 u[IU]/mL (ref 0.450–4.500)

## 2024-10-29 DIAGNOSIS — M9905 Segmental and somatic dysfunction of pelvic region: Secondary | ICD-10-CM | POA: Diagnosis not present

## 2024-10-29 DIAGNOSIS — M546 Pain in thoracic spine: Secondary | ICD-10-CM | POA: Diagnosis not present

## 2024-10-29 DIAGNOSIS — M542 Cervicalgia: Secondary | ICD-10-CM | POA: Diagnosis not present

## 2024-10-29 DIAGNOSIS — M5442 Lumbago with sciatica, left side: Secondary | ICD-10-CM | POA: Diagnosis not present

## 2024-10-29 DIAGNOSIS — M9902 Segmental and somatic dysfunction of thoracic region: Secondary | ICD-10-CM | POA: Diagnosis not present

## 2024-10-29 DIAGNOSIS — M9903 Segmental and somatic dysfunction of lumbar region: Secondary | ICD-10-CM | POA: Diagnosis not present

## 2024-10-29 DIAGNOSIS — M9901 Segmental and somatic dysfunction of cervical region: Secondary | ICD-10-CM | POA: Diagnosis not present

## 2024-11-02 ENCOUNTER — Ambulatory Visit: Payer: Self-pay | Admitting: Family Medicine

## 2024-11-17 ENCOUNTER — Other Ambulatory Visit: Payer: Self-pay | Admitting: Internal Medicine

## 2024-11-18 NOTE — Telephone Encounter (Signed)
 Prescription refill request for Xarelto  received.  Indication: a-fib Last office visit: 01/14/2024 Weight: 113.4kg Age: 68 Scr: 1.75 (10/09/2024) CrCl: 57  Dose appropriate, refill sent

## 2024-11-23 ENCOUNTER — Other Ambulatory Visit: Payer: Self-pay | Admitting: Internal Medicine

## 2024-11-24 ENCOUNTER — Other Ambulatory Visit: Payer: Self-pay | Admitting: Internal Medicine

## 2024-11-27 ENCOUNTER — Other Ambulatory Visit: Payer: Self-pay | Admitting: Family Medicine

## 2024-11-27 DIAGNOSIS — J302 Other seasonal allergic rhinitis: Secondary | ICD-10-CM

## 2024-12-03 ENCOUNTER — Other Ambulatory Visit: Payer: Self-pay | Admitting: Internal Medicine

## 2024-12-03 DIAGNOSIS — E1122 Type 2 diabetes mellitus with diabetic chronic kidney disease: Secondary | ICD-10-CM

## 2024-12-26 NOTE — Progress Notes (Signed)
 Isabella Hall                                          MRN: [id number]   12/26/2024   The [facility] Quality Team Specialist reviewed this patient medical record for the purposes of chart review for care gap closure. The following were reviewed: chart review for care gap closure-kidney health evaluation for diabetes:eGFR  and uACR.    VBCI Quality Team

## 2025-01-05 ENCOUNTER — Other Ambulatory Visit: Payer: Self-pay | Admitting: Internal Medicine

## 2025-01-06 ENCOUNTER — Other Ambulatory Visit: Payer: Self-pay | Admitting: Internal Medicine

## 2025-01-06 MED ORDER — DILTIAZEM HCL ER 120 MG PO CP12: 120.0000 mg | ORAL_CAPSULE | Freq: Two times a day (BID) | ORAL | 0 refills | Status: AC

## 2025-02-09 ENCOUNTER — Ambulatory Visit: Admitting: Family Medicine

## 2025-05-18 ENCOUNTER — Ambulatory Visit
